# Patient Record
Sex: Female | Born: 1950 | Race: White | Hispanic: No | Marital: Married | Smoking: Never smoker
Health system: Southern US, Community
[De-identification: ages and names within clinical notes are randomized; demographics above are authoritative.]

## PROBLEM LIST (undated history)

## (undated) DIAGNOSIS — M549 Dorsalgia, unspecified: Secondary | ICD-10-CM

## (undated) DIAGNOSIS — R3915 Urgency of urination: Secondary | ICD-10-CM

## (undated) DIAGNOSIS — D649 Anemia, unspecified: Secondary | ICD-10-CM

## (undated) DIAGNOSIS — F32A Depression, unspecified: Secondary | ICD-10-CM

## (undated) DIAGNOSIS — M1990 Unspecified osteoarthritis, unspecified site: Secondary | ICD-10-CM

## (undated) DIAGNOSIS — R918 Other nonspecific abnormal finding of lung field: Secondary | ICD-10-CM

## (undated) DIAGNOSIS — M47816 Spondylosis without myelopathy or radiculopathy, lumbar region: Secondary | ICD-10-CM

## (undated) DIAGNOSIS — J45909 Unspecified asthma, uncomplicated: Secondary | ICD-10-CM

## (undated) DIAGNOSIS — E559 Vitamin D deficiency, unspecified: Secondary | ICD-10-CM

## (undated) DIAGNOSIS — IMO0002 Reserved for concepts with insufficient information to code with codable children: Secondary | ICD-10-CM

## (undated) DIAGNOSIS — I639 Cerebral infarction, unspecified: Secondary | ICD-10-CM

## (undated) DIAGNOSIS — F411 Generalized anxiety disorder: Secondary | ICD-10-CM

## (undated) DIAGNOSIS — Z8659 Personal history of other mental and behavioral disorders: Secondary | ICD-10-CM

## (undated) DIAGNOSIS — N3010 Interstitial cystitis (chronic) without hematuria: Secondary | ICD-10-CM

## (undated) DIAGNOSIS — F419 Anxiety disorder, unspecified: Secondary | ICD-10-CM

## (undated) DIAGNOSIS — R350 Frequency of micturition: Secondary | ICD-10-CM

## (undated) DIAGNOSIS — N189 Chronic kidney disease, unspecified: Secondary | ICD-10-CM

## (undated) DIAGNOSIS — K834 Spasm of sphincter of Oddi: Secondary | ICD-10-CM

## (undated) DIAGNOSIS — J4540 Moderate persistent asthma, uncomplicated: Secondary | ICD-10-CM

## (undated) DIAGNOSIS — F329 Major depressive disorder, single episode, unspecified: Secondary | ICD-10-CM

## (undated) DIAGNOSIS — R0600 Dyspnea, unspecified: Secondary | ICD-10-CM

## (undated) DIAGNOSIS — R7303 Prediabetes: Secondary | ICD-10-CM

## (undated) DIAGNOSIS — R112 Nausea with vomiting, unspecified: Secondary | ICD-10-CM

## (undated) DIAGNOSIS — Z8719 Personal history of other diseases of the digestive system: Secondary | ICD-10-CM

## (undated) DIAGNOSIS — E782 Mixed hyperlipidemia: Secondary | ICD-10-CM

## (undated) DIAGNOSIS — M5090 Cervical disc disorder, unspecified, unspecified cervical region: Secondary | ICD-10-CM

## (undated) DIAGNOSIS — K602 Anal fissure, unspecified: Secondary | ICD-10-CM

## (undated) DIAGNOSIS — Z973 Presence of spectacles and contact lenses: Secondary | ICD-10-CM

## (undated) DIAGNOSIS — G43909 Migraine, unspecified, not intractable, without status migrainosus: Secondary | ICD-10-CM

## (undated) DIAGNOSIS — E785 Hyperlipidemia, unspecified: Secondary | ICD-10-CM

## (undated) DIAGNOSIS — R05 Cough: Secondary | ICD-10-CM

## (undated) DIAGNOSIS — IMO0001 Reserved for inherently not codable concepts without codable children: Secondary | ICD-10-CM

## (undated) DIAGNOSIS — R002 Palpitations: Secondary | ICD-10-CM

## (undated) DIAGNOSIS — N641 Fat necrosis of breast: Secondary | ICD-10-CM

## (undated) DIAGNOSIS — I10 Essential (primary) hypertension: Secondary | ICD-10-CM

## (undated) DIAGNOSIS — T4145XA Adverse effect of unspecified anesthetic, initial encounter: Secondary | ICD-10-CM

## (undated) DIAGNOSIS — K589 Irritable bowel syndrome without diarrhea: Secondary | ICD-10-CM

## (undated) DIAGNOSIS — Z9889 Other specified postprocedural states: Secondary | ICD-10-CM

## (undated) DIAGNOSIS — Z8673 Personal history of transient ischemic attack (TIA), and cerebral infarction without residual deficits: Secondary | ICD-10-CM

## (undated) DIAGNOSIS — N183 Chronic kidney disease, stage 3 unspecified: Secondary | ICD-10-CM

## (undated) DIAGNOSIS — R3989 Other symptoms and signs involving the genitourinary system: Secondary | ICD-10-CM

## (undated) DIAGNOSIS — K582 Mixed irritable bowel syndrome: Secondary | ICD-10-CM

## (undated) DIAGNOSIS — M797 Fibromyalgia: Secondary | ICD-10-CM

## (undated) DIAGNOSIS — F4001 Agoraphobia with panic disorder: Secondary | ICD-10-CM

## (undated) DIAGNOSIS — K219 Gastro-esophageal reflux disease without esophagitis: Secondary | ICD-10-CM

## (undated) DIAGNOSIS — R053 Chronic cough: Secondary | ICD-10-CM

## (undated) DIAGNOSIS — R011 Cardiac murmur, unspecified: Secondary | ICD-10-CM

## (undated) DIAGNOSIS — J302 Other seasonal allergic rhinitis: Secondary | ICD-10-CM

## (undated) DIAGNOSIS — R0989 Other specified symptoms and signs involving the circulatory and respiratory systems: Secondary | ICD-10-CM

## (undated) HISTORY — DX: Unspecified osteoarthritis, unspecified site: M19.90

## (undated) HISTORY — DX: Anal fissure, unspecified: K60.2

## (undated) HISTORY — PX: OTHER SURGICAL HISTORY: SHX169

## (undated) HISTORY — DX: Depression, unspecified: F32.A

## (undated) HISTORY — DX: Unspecified asthma, uncomplicated: J45.909

## (undated) HISTORY — DX: Prediabetes: R73.03

## (undated) HISTORY — DX: Interstitial cystitis (chronic) without hematuria: N30.10

## (undated) HISTORY — DX: Palpitations: R00.2

## (undated) HISTORY — PX: COLONOSCOPY: SHX174

## (undated) HISTORY — PX: CATARACT EXTRACTION W/ INTRAOCULAR LENS  IMPLANT, BILATERAL: SHX1307

## (undated) HISTORY — DX: Gastro-esophageal reflux disease without esophagitis: K21.9

## (undated) HISTORY — DX: Cardiac murmur, unspecified: R01.1

## (undated) HISTORY — DX: Irritable bowel syndrome without diarrhea: K58.9

## (undated) HISTORY — DX: Essential (primary) hypertension: I10

## (undated) HISTORY — DX: Major depressive disorder, single episode, unspecified: F32.9

## (undated) HISTORY — DX: Dorsalgia, unspecified: M54.9

## (undated) HISTORY — DX: Vitamin D deficiency, unspecified: E55.9

## (undated) HISTORY — DX: Cerebral infarction, unspecified: I63.9

## (undated) HISTORY — DX: Anemia, unspecified: D64.9

## (undated) HISTORY — DX: Hyperlipidemia, unspecified: E78.5

## (undated) HISTORY — DX: Spasm of sphincter of Oddi: K83.4

## (undated) HISTORY — DX: Fibromyalgia: M79.7

## (undated) HISTORY — DX: Anxiety disorder, unspecified: F41.9

## (undated) HISTORY — PX: HEMORRHOID SURGERY: SHX153

---
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
## 1975-05-01 HISTORY — PX: NASAL SINUS SURGERY: SHX719

## 1975-05-01 HISTORY — PX: RHINOPLASTY: SUR1284

## 1976-04-30 HISTORY — PX: OTHER SURGICAL HISTORY: SHX169

## 1981-04-30 HISTORY — PX: CYSTOSTOMY W/ BLADDER BIOPSY: SHX1431

## 1984-07-29 HISTORY — PX: CHOLECYSTECTOMY: SHX55

## 1984-07-29 HISTORY — PX: APPENDECTOMY: SHX54

## 1985-04-30 DIAGNOSIS — K834 Spasm of sphincter of Oddi: Secondary | ICD-10-CM

## 1985-04-30 HISTORY — PX: DILATION AND CURETTAGE OF UTERUS: SHX78

## 1985-04-30 HISTORY — DX: Spasm of sphincter of Oddi: K83.4

## 1986-04-30 HISTORY — PX: ERCP: SHX60

## 1986-04-30 HISTORY — PX: REDUCTION MAMMAPLASTY: SUR839

## 1986-04-30 HISTORY — PX: OTHER SURGICAL HISTORY: SHX169

## 1986-04-30 HISTORY — PX: ABDOMINAL HYSTERECTOMY: SHX81

## 1988-04-30 HISTORY — PX: OTHER SURGICAL HISTORY: SHX169

## 1994-04-30 DIAGNOSIS — Z8673 Personal history of transient ischemic attack (TIA), and cerebral infarction without residual deficits: Secondary | ICD-10-CM

## 1994-04-30 HISTORY — DX: Personal history of transient ischemic attack (TIA), and cerebral infarction without residual deficits: Z86.73

## 1996-04-30 HISTORY — PX: OTHER SURGICAL HISTORY: SHX169

## 1997-09-17 ENCOUNTER — Other Ambulatory Visit: Admission: RE | Admit: 1997-09-17 | Discharge: 1997-09-17 | Payer: Self-pay | Admitting: Obstetrics & Gynecology

## 1998-08-12 ENCOUNTER — Ambulatory Visit (HOSPITAL_BASED_OUTPATIENT_CLINIC_OR_DEPARTMENT_OTHER): Admission: RE | Admit: 1998-08-12 | Discharge: 1998-08-12 | Payer: Self-pay | Admitting: Urology

## 1999-03-28 ENCOUNTER — Emergency Department (HOSPITAL_COMMUNITY): Admission: EM | Admit: 1999-03-28 | Discharge: 1999-03-28 | Payer: Self-pay | Admitting: *Deleted

## 1999-03-29 ENCOUNTER — Encounter: Payer: Self-pay | Admitting: Urology

## 1999-03-29 ENCOUNTER — Ambulatory Visit (HOSPITAL_COMMUNITY): Admission: RE | Admit: 1999-03-29 | Discharge: 1999-03-29 | Payer: Self-pay | Admitting: Urology

## 1999-06-13 ENCOUNTER — Encounter: Admission: RE | Admit: 1999-06-13 | Discharge: 1999-06-13 | Payer: Self-pay | Admitting: Obstetrics & Gynecology

## 1999-06-13 ENCOUNTER — Encounter: Payer: Self-pay | Admitting: Obstetrics & Gynecology

## 1999-07-14 ENCOUNTER — Ambulatory Visit (HOSPITAL_BASED_OUTPATIENT_CLINIC_OR_DEPARTMENT_OTHER): Admission: RE | Admit: 1999-07-14 | Discharge: 1999-07-14 | Payer: Self-pay | Admitting: Urology

## 1999-10-24 ENCOUNTER — Other Ambulatory Visit: Admission: RE | Admit: 1999-10-24 | Discharge: 1999-10-24 | Payer: Self-pay | Admitting: Obstetrics & Gynecology

## 2000-05-09 ENCOUNTER — Ambulatory Visit (HOSPITAL_BASED_OUTPATIENT_CLINIC_OR_DEPARTMENT_OTHER): Admission: RE | Admit: 2000-05-09 | Discharge: 2000-05-09 | Payer: Self-pay | Admitting: Urology

## 2000-08-08 ENCOUNTER — Ambulatory Visit (HOSPITAL_COMMUNITY): Admission: RE | Admit: 2000-08-08 | Discharge: 2000-08-08 | Payer: Self-pay

## 2000-11-25 ENCOUNTER — Other Ambulatory Visit: Admission: RE | Admit: 2000-11-25 | Discharge: 2000-11-25 | Payer: Self-pay | Admitting: Obstetrics & Gynecology

## 2000-12-31 ENCOUNTER — Encounter: Admission: RE | Admit: 2000-12-31 | Discharge: 2000-12-31 | Payer: Self-pay | Admitting: Obstetrics & Gynecology

## 2000-12-31 ENCOUNTER — Encounter: Payer: Self-pay | Admitting: Obstetrics & Gynecology

## 2001-03-12 ENCOUNTER — Ambulatory Visit (HOSPITAL_BASED_OUTPATIENT_CLINIC_OR_DEPARTMENT_OTHER): Admission: RE | Admit: 2001-03-12 | Discharge: 2001-03-12 | Payer: Self-pay | Admitting: *Deleted

## 2001-10-23 ENCOUNTER — Ambulatory Visit (HOSPITAL_BASED_OUTPATIENT_CLINIC_OR_DEPARTMENT_OTHER): Admission: RE | Admit: 2001-10-23 | Discharge: 2001-10-23 | Payer: Self-pay | Admitting: Urology

## 2001-12-15 ENCOUNTER — Other Ambulatory Visit: Admission: RE | Admit: 2001-12-15 | Discharge: 2001-12-15 | Payer: Self-pay | Admitting: Obstetrics & Gynecology

## 2002-01-20 ENCOUNTER — Encounter: Payer: Self-pay | Admitting: Obstetrics & Gynecology

## 2002-01-20 ENCOUNTER — Encounter: Admission: RE | Admit: 2002-01-20 | Discharge: 2002-01-20 | Payer: Self-pay | Admitting: Obstetrics & Gynecology

## 2002-12-10 ENCOUNTER — Other Ambulatory Visit: Admission: RE | Admit: 2002-12-10 | Discharge: 2002-12-10 | Payer: Self-pay | Admitting: Obstetrics & Gynecology

## 2003-01-15 ENCOUNTER — Ambulatory Visit (HOSPITAL_BASED_OUTPATIENT_CLINIC_OR_DEPARTMENT_OTHER): Admission: RE | Admit: 2003-01-15 | Discharge: 2003-01-15 | Payer: Self-pay | Admitting: Urology

## 2003-01-15 ENCOUNTER — Ambulatory Visit (HOSPITAL_COMMUNITY): Admission: RE | Admit: 2003-01-15 | Discharge: 2003-01-15 | Payer: Self-pay | Admitting: Urology

## 2003-02-03 ENCOUNTER — Encounter: Admission: RE | Admit: 2003-02-03 | Discharge: 2003-02-03 | Payer: Self-pay | Admitting: Obstetrics & Gynecology

## 2003-02-03 ENCOUNTER — Encounter: Payer: Self-pay | Admitting: Obstetrics & Gynecology

## 2003-07-30 HISTORY — PX: LASIK: SHX215

## 2003-12-17 ENCOUNTER — Ambulatory Visit (HOSPITAL_BASED_OUTPATIENT_CLINIC_OR_DEPARTMENT_OTHER): Admission: RE | Admit: 2003-12-17 | Discharge: 2003-12-17 | Payer: Self-pay | Admitting: Urology

## 2004-02-08 ENCOUNTER — Encounter: Admission: RE | Admit: 2004-02-08 | Discharge: 2004-02-08 | Payer: Self-pay | Admitting: Obstetrics & Gynecology

## 2004-02-16 ENCOUNTER — Other Ambulatory Visit: Admission: RE | Admit: 2004-02-16 | Discharge: 2004-02-16 | Payer: Self-pay | Admitting: Obstetrics & Gynecology

## 2004-03-03 ENCOUNTER — Ambulatory Visit (HOSPITAL_COMMUNITY): Admission: RE | Admit: 2004-03-03 | Discharge: 2004-03-03 | Payer: Self-pay | Admitting: Gastroenterology

## 2004-05-29 ENCOUNTER — Encounter: Admission: RE | Admit: 2004-05-29 | Discharge: 2004-05-29 | Payer: Self-pay | Admitting: Internal Medicine

## 2004-08-18 ENCOUNTER — Ambulatory Visit (HOSPITAL_COMMUNITY): Admission: RE | Admit: 2004-08-18 | Discharge: 2004-08-18 | Payer: Self-pay | Admitting: Urology

## 2004-08-18 ENCOUNTER — Ambulatory Visit (HOSPITAL_BASED_OUTPATIENT_CLINIC_OR_DEPARTMENT_OTHER): Admission: RE | Admit: 2004-08-18 | Discharge: 2004-08-18 | Payer: Self-pay | Admitting: Urology

## 2004-11-20 ENCOUNTER — Encounter: Admission: RE | Admit: 2004-11-20 | Discharge: 2004-11-20 | Payer: Self-pay | Admitting: Internal Medicine

## 2004-11-28 ENCOUNTER — Ambulatory Visit: Payer: Self-pay

## 2005-02-26 ENCOUNTER — Encounter: Admission: RE | Admit: 2005-02-26 | Discharge: 2005-02-26 | Payer: Self-pay | Admitting: Internal Medicine

## 2005-03-07 ENCOUNTER — Other Ambulatory Visit: Admission: RE | Admit: 2005-03-07 | Discharge: 2005-03-07 | Payer: Self-pay | Admitting: Obstetrics & Gynecology

## 2005-03-12 ENCOUNTER — Encounter: Admission: RE | Admit: 2005-03-12 | Discharge: 2005-03-12 | Payer: Self-pay | Admitting: Internal Medicine

## 2005-04-06 ENCOUNTER — Ambulatory Visit (HOSPITAL_COMMUNITY): Admission: RE | Admit: 2005-04-06 | Discharge: 2005-04-06 | Payer: Self-pay | Admitting: Urology

## 2005-04-06 ENCOUNTER — Ambulatory Visit (HOSPITAL_BASED_OUTPATIENT_CLINIC_OR_DEPARTMENT_OTHER): Admission: RE | Admit: 2005-04-06 | Discharge: 2005-04-06 | Payer: Self-pay | Admitting: Urology

## 2005-11-04 ENCOUNTER — Encounter: Admission: RE | Admit: 2005-11-04 | Discharge: 2005-11-04 | Payer: Self-pay | Admitting: Urology

## 2006-02-22 ENCOUNTER — Ambulatory Visit (HOSPITAL_BASED_OUTPATIENT_CLINIC_OR_DEPARTMENT_OTHER): Admission: RE | Admit: 2006-02-22 | Discharge: 2006-02-22 | Payer: Self-pay | Admitting: Urology

## 2006-03-28 ENCOUNTER — Encounter: Admission: RE | Admit: 2006-03-28 | Discharge: 2006-03-28 | Payer: Self-pay | Admitting: Obstetrics & Gynecology

## 2006-04-19 ENCOUNTER — Encounter: Admission: RE | Admit: 2006-04-19 | Discharge: 2006-04-19 | Payer: Self-pay | Admitting: Urology

## 2006-09-06 ENCOUNTER — Ambulatory Visit (HOSPITAL_BASED_OUTPATIENT_CLINIC_OR_DEPARTMENT_OTHER): Admission: RE | Admit: 2006-09-06 | Discharge: 2006-09-06 | Payer: Self-pay | Admitting: Urology

## 2006-11-22 ENCOUNTER — Encounter: Admission: RE | Admit: 2006-11-22 | Discharge: 2006-11-22 | Payer: Self-pay | Admitting: Urology

## 2007-04-03 ENCOUNTER — Encounter: Admission: RE | Admit: 2007-04-03 | Discharge: 2007-04-03 | Payer: Self-pay | Admitting: Internal Medicine

## 2007-04-04 ENCOUNTER — Ambulatory Visit (HOSPITAL_BASED_OUTPATIENT_CLINIC_OR_DEPARTMENT_OTHER): Admission: RE | Admit: 2007-04-04 | Discharge: 2007-04-04 | Payer: Self-pay | Admitting: Urology

## 2007-11-20 ENCOUNTER — Ambulatory Visit: Payer: Self-pay

## 2008-04-08 ENCOUNTER — Encounter: Admission: RE | Admit: 2008-04-08 | Discharge: 2008-04-08 | Payer: Self-pay | Admitting: Obstetrics & Gynecology

## 2008-05-21 ENCOUNTER — Ambulatory Visit (HOSPITAL_BASED_OUTPATIENT_CLINIC_OR_DEPARTMENT_OTHER): Admission: RE | Admit: 2008-05-21 | Discharge: 2008-05-21 | Payer: Self-pay | Admitting: Urology

## 2008-07-09 ENCOUNTER — Encounter: Admission: RE | Admit: 2008-07-09 | Discharge: 2008-07-09 | Payer: Self-pay | Admitting: Neurosurgery

## 2008-11-15 ENCOUNTER — Encounter: Admission: RE | Admit: 2008-11-15 | Discharge: 2008-11-15 | Payer: Self-pay | Admitting: Internal Medicine

## 2008-12-22 ENCOUNTER — Encounter: Admission: RE | Admit: 2008-12-22 | Discharge: 2008-12-22 | Payer: Self-pay | Admitting: Neurosurgery

## 2009-03-18 ENCOUNTER — Ambulatory Visit (HOSPITAL_BASED_OUTPATIENT_CLINIC_OR_DEPARTMENT_OTHER): Admission: RE | Admit: 2009-03-18 | Discharge: 2009-03-18 | Payer: Self-pay | Admitting: Urology

## 2009-04-13 ENCOUNTER — Encounter: Admission: RE | Admit: 2009-04-13 | Discharge: 2009-04-13 | Payer: Self-pay | Admitting: Obstetrics & Gynecology

## 2009-06-17 ENCOUNTER — Encounter: Admission: RE | Admit: 2009-06-17 | Discharge: 2009-06-17 | Payer: Self-pay | Admitting: Neurosurgery

## 2010-03-31 ENCOUNTER — Ambulatory Visit
Admission: RE | Admit: 2010-03-31 | Discharge: 2010-03-31 | Payer: Self-pay | Source: Home / Self Care | Admitting: Urology

## 2010-04-17 ENCOUNTER — Encounter
Admission: RE | Admit: 2010-04-17 | Discharge: 2010-04-17 | Payer: Self-pay | Source: Home / Self Care | Attending: Obstetrics & Gynecology | Admitting: Obstetrics & Gynecology

## 2010-05-20 ENCOUNTER — Encounter: Payer: Self-pay | Admitting: Internal Medicine

## 2010-05-21 ENCOUNTER — Encounter: Payer: Self-pay | Admitting: Family Medicine

## 2010-05-22 ENCOUNTER — Encounter: Payer: Self-pay | Admitting: Internal Medicine

## 2010-07-10 LAB — POCT I-STAT 4, (NA,K, GLUC, HGB,HCT)
Glucose, Bld: 90 mg/dL (ref 70–99)
HCT: 49 % — ABNORMAL HIGH (ref 36.0–46.0)
Hemoglobin: 16.7 g/dL — ABNORMAL HIGH (ref 12.0–15.0)
Potassium: 4.5 mEq/L (ref 3.5–5.1)
Sodium: 141 mEq/L (ref 135–145)

## 2010-08-02 LAB — POCT I-STAT 4, (NA,K, GLUC, HGB,HCT)
Glucose, Bld: 83 mg/dL (ref 70–99)
Glucose, Bld: 84 mg/dL (ref 70–99)
HCT: 22 % — ABNORMAL LOW (ref 36.0–46.0)
HCT: 27 % — ABNORMAL LOW (ref 36.0–46.0)
Hemoglobin: 7.5 g/dL — ABNORMAL LOW (ref 12.0–15.0)
Hemoglobin: 9.2 g/dL — ABNORMAL LOW (ref 12.0–15.0)
Potassium: 4.1 mEq/L (ref 3.5–5.1)
Potassium: 4.2 mEq/L (ref 3.5–5.1)
Sodium: 139 mEq/L (ref 135–145)
Sodium: 140 mEq/L (ref 135–145)

## 2010-08-14 LAB — POCT I-STAT 4, (NA,K, GLUC, HGB,HCT)
Glucose, Bld: 84 mg/dL (ref 70–99)
HCT: 48 % — ABNORMAL HIGH (ref 36.0–46.0)
Hemoglobin: 16.3 g/dL — ABNORMAL HIGH (ref 12.0–15.0)
Potassium: 4 mEq/L (ref 3.5–5.1)
Sodium: 140 mEq/L (ref 135–145)

## 2010-09-12 NOTE — Op Note (Signed)
NAMEMERRIDITH, Brenda [name]               ACCOUNT NO.:  [id number]   MEDICAL RECORD NO.:  [id number]          PATIENT TYPE:  AMB   LOCATION:  [facility]                         FACILITY:  [facility]   PHYSICIAN:  Maretta Bees. Vonita Moss, M.D.DATE OF BIRTH:  15-Mar-1951   DATE OF PROCEDURE:  04/04/2007  DATE OF DISCHARGE:                               OPERATIVE REPORT   PREOPERATIVE DIAGNOSES:  1. Interstitial cystitis.  2. Urethral stenosis.   POSTOPERATIVE DIAGNOSES:  1. Interstitial cystitis.  2. Urethral stenosis.   PROCEDURES:  1. Urethral dilation.  2. Cystoscopy.  3. Hydraulic overdistention of bladder.  4. Instillation of Clorpactin.   SURGEON:  Maretta Bees. Vonita Moss, M.D.   ANESTHESIA:  MAC.   INDICATIONS:  Brenda [name] has had a long history of interstitial cystitis and  had a flare-up of her symptoms and is brought to the OR today for  further treatment.   PROCEDURE:  She was brought to the operating room and placed in  lithotomy position and the external genitalia prepped and draped in the  usual fashion.  She was dilated from 24 to 60 [place].  She was  cystoscoped and the bladder was unremarkable.  She held approximately  600 mL and I distended her twice, and then she had submucosal petechiae  and hemorrhage in all 4 quadrants but not much bleeding.  Ten minutes of  in-and-out irrigation with 0.4% Clorpactin was completed without  difficulty.  The bladder was emptied, washed out with water, and then 6  mL of tetracaine instilled and left in the bladder.  She tolerated the  procedure well.      Maretta Bees. Vonita Moss, M.D.  Electronically Signed     LJP/MEDQ  D:  04/04/2007  T:  04/04/2007  Job:  [id number]

## 2010-09-12 NOTE — Op Note (Signed)
[name], Brenda [name]               ACCOUNT NO.:  [id number]   MEDICAL RECORD NO.:  [id number]          PATIENT TYPE:  AMB   LOCATION:  [facility]                         FACILITY:  [facility]   PHYSICIAN:  Maretta Bees. Vonita Moss, M.D.DATE OF BIRTH:  1951/03/03   DATE OF PROCEDURE:  05/21/2008  DATE OF DISCHARGE:                               OPERATIVE REPORT   PREOPERATIVE DIAGNOSIS:  Interstitial cystitis and urethral stenosis.   POSTOPERATIVE DIAGNOSES:  Interstitial cystitis and urethral stenosis.   PROCEDURE:  Cystoscopy, urethral dilation, hydraulic overdistention of  bladder and instillation of Clorpactin.   SURGEON:  Dr. Larey Dresser   ANESTHESIA:  MAC.   INDICATIONS:  Brenda [name] needs further therapy of her chronic interstitial  cystitis and is brought to the OR today for that reason.   PROCEDURE:  The patient was brought to the operating room, placed in  lithotomy position after induction of MAC.  Urethra was dilated from 24  to 30 [place].  She was cystoscoped and the bladder had some increased  vascularity and held 500 to 600 mL after which she had some increased  petechiae and mild hemorrhage.  She was then treated with 10 minutes of  in-and-out irrigation of 0.4% Clorpactin.  The bladder was then washed  out with sterile water and 60 mL of tetracaine solution placed in the  bladder and left in place with a Silastic Foley catheter, clamped off.  She will get further tetracaine in the recovery room before she goes  home and will be given 75 mg Demerol and 25 mg Phenergan IM at the time  of discharge from this facility      [employer]. Vonita Moss, M.D.  Electronically Signed     LJP/MEDQ  D:  05/21/2008  T:  05/21/2008  Job:  [id number]

## 2010-09-15 NOTE — Op Note (Signed)
[place]  Patient:    Brenda [name], Brenda [name]                        MRN: [id number] Proc. Date: 07/14/99 Attending:  Maretta Bees. Vonita Moss, M.D.                           Operative Report  PREOPERATIVE DIAGNOSIS:  Interstitial cystitis and urethral stricture.  POSTOPERATIVE DIAGNOSIS:  Interstitial cystitis and urethral stricture.  OPERATION:  Cystoscopy, urethral dilation, hydraulic dilation of bladder and instillation of Clorpactin.  SURGEON:  Maretta Bees. Vonita Moss, M.D.  ANESTHESIA:  MAC.  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  She has had a flare-up of her symptoms and requires intervention at this point.  DESCRIPTION OF PROCEDURE:  The patient was brought to the operating room and placed in the lithotomy position.  External genitalia was prepped and draped in the usual fashion.  She was dilated to 17 [place] after instilling Xylocaine jelly.  She was cystoscoped and the bladder had no stones, tumors, or inflammatory lesions. She had a capacity of 400 cc under anesthesia and looking back in she had scattered  submucosa petechiae and hemorrhage.  She was then treated with 10 minutes of in-and-out irrigation with 0.4% Clorpactin solution.  The bladder was then irrigated with irrigating solution and 60 cc of Tetracaine solution left in the  bladder.  She tolerated the procedure well. DD:  07/14/99 TD:  07/14/99 Job: [id number]

## 2010-09-15 NOTE — Op Note (Signed)
[facility]  Patient:    Brenda [name], Brenda [name] Visit Number: [id number] MRN: [id number]          Service Type: NES Location: [facility] Attending Physician:  Lauree Chandler Dictated by:   Maretta Bees. Vonita Moss, M.D. Proc. Date: 10/23/01 Admit Date:  10/23/2001                             Operative Report  PREOPERATIVE DIAGNOSES:  Interstitial cystitis and urethral syndrome.  POSTOPERATIVE DIAGNOSES:  Interstitial cystitis and urethral syndrome.  PROCEDURE:  Cystoscopy, urethral dilation, hydraulic dilation of the bladder, and installation of Clorpactin.  SURGEON:  Maretta Bees. Vonita Moss, M.D.  ANESTHESIA:  MAC.  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 She is brought to the OR for further evaluation and treatment.  DESCRIPTION OF PROCEDURE:  The patient was brought to the operating room, placed in lithotomy position, external genitalia were prepped and draped in the usual fashion. The urethra was dilated to 30 [place]. She was then cystoscoped and the bladder was unremarkable. She was filled to 500 cc capacity and looking back in she had scattered submucosal petechia. She was then treated with 10 minutes of in and out irrigation of 0.4% Clorpactin solution. The bladder was then irrigated with sterile water and 60 cc of tetracaine solution was left in the bladder. She tolerated the procedure. Dictated by:   Maretta Bees. Vonita Moss, M.D. Attending Physician:  Lauree Chandler DD:  10/23/01 TD:  10/24/01 Job: [id number]

## 2010-09-15 NOTE — Op Note (Signed)
NAMEVENESHA, Brenda [name]               ACCOUNT NO.:  [id number]   MEDICAL RECORD NO.:  [id number]          PATIENT TYPE:  AMB   LOCATION:  [facility]                         FACILITY:  [facility]   PHYSICIAN:  Maretta Bees. Vonita Moss, M.D.DATE OF BIRTH:  09/26/1950   DATE OF PROCEDURE:  08/18/2004  DATE OF DISCHARGE:                                 OPERATIVE REPORT   PREOPERATIVE DIAGNOSES:  Right lower quadrant pain and interstitial  cystitis.   POSTOPERATIVE DIAGNOSES:  Right lower quadrant pain and interstitial  cystitis.   PROCEDURE:  Examination under anesthesia, cystoscopy, urethral dilation,  hydraulic over distention of the bladder and installation of Clorpactin.   SURGEON:  Maretta Bees. Vonita Moss, M.D.   ANESTHESIA:  MAC.   INDICATIONS FOR PROCEDURE:  Brenda [name] has had chronic interstitial cystitis and  had a flareup of her symptoms and also some right lower quadrant discomfort.  She is brought to the OR for evaluation and therapy.   DESCRIPTION OF PROCEDURE:  The patient is brought to the operating room,  placed in lithotomy position and pelvic exam under anesthesia was performed  and there were absolutely no pelvic masses felt. She was cystoscoped and the  bladder was unremarkable. She was distended about 500 mL. Looking back in,  she had scattered submucosal petechia but no florid hemorrhage. She was also  dilated to 65 [place] with female sounds. Ten minutes of in and out  irrigation with 0.4% Clorpactin was performed without difficulty. The  bladder was then drained and irrigated with water and then 6 mL of  __________ was left in the bladder at the end of the case and she was taken  to the recovery room in good condition having tolerated the procedure well.      LJP/MEDQ  D:  08/18/2004  T:  08/18/2004  Job:  [id number]

## 2010-09-15 NOTE — Op Note (Signed)
NAME:  Brenda [name], Brenda [name]                         ACCOUNT NO.:  [id number]   MEDICAL RECORD NO.:  [id number]                   PATIENT TYPE:  AMB   LOCATION:  [facility]                                 FACILITY:  [facility]   PHYSICIAN:  Maretta Bees. Vonita Moss, M.D.             DATE OF BIRTH:  [birth year]   DATE OF PROCEDURE:  DATE OF DISCHARGE:                                 OPERATIVE REPORT   No dictation for this job.                                               Maretta Bees. Vonita Moss, M.D.    LJP/MEDQ  D:  12/17/2003  T:  12/17/2003  Job:  [id number]

## 2010-09-15 NOTE — Op Note (Signed)
Brenda, [name]               ACCOUNT NO.:  [id number]   MEDICAL RECORD NO.:  [id number]          PATIENT TYPE:  AMB   LOCATION:  [facility]                         FACILITY:  [facility]   PHYSICIAN:  Maretta Bees. Vonita Moss, M.D.DATE OF BIRTH:  Jun 26, 1950   DATE OF PROCEDURE:  04/06/2005  DATE OF DISCHARGE:                                 OPERATIVE REPORT   PREOPERATIVE DIAGNOSIS:  Chronic interstitial cystitis.   POSTOPERATIVE DIAGNOSIS:  Chronic interstitial cystitis.   PROCEDURE:  Cystoscopy, hydraulic overdistention of bladder, installation of  Clorpactin, and urethral dilation.   SURGEON:  Dr. Larey Dresser   ANESTHESIA:  MAC.   INDICATIONS:  Brenda [name] has had a flare-up of her interstitial cystitis and  brought the OR today for treatment of such.   PROCEDURE:  The patient was brought to the operating room, placed in  lithotomy position, and monitored and MAC was utilized for sedation and  comfort. Urethra was dilated to 28-French. Cystoscopy was performed, and the  bladder was unremarkable. She was distended to a little over 500 mL. In  looking back in, there were some widespread submucosal petechiae and slight  hemorrhage. She then underwent 10 minutes of in-and-out irrigation of 0.4%  Clorpactin solution. Then the bladder was irrigated with sterile water and 6  mL of tetracaine was left in the bladder, and she was taken to the recovery  room in good condition having tolerated the procedure well.      Maretta Bees. Vonita Moss, M.D.  Electronically Signed     LJP/MEDQ  D:  04/06/2005  T:  04/06/2005  Job:  [id number]

## 2010-09-15 NOTE — Op Note (Signed)
Brenda [name], Brenda [name]               ACCOUNT NO.:  [id number]   MEDICAL RECORD NO.:  [id number]          PATIENT TYPE:  AMB   LOCATION:  [facility]                         FACILITY:  [facility]   PHYSICIAN:  Maretta Bees. Vonita Moss, M.D.DATE OF BIRTH:  06-12-1950   DATE OF PROCEDURE:  02/22/2006  DATE OF DISCHARGE:                                 OPERATIVE REPORT   PREOPERATIVE DIAGNOSES:  1. Interstitial cystitis.  2. Urethral stenosis.   POSTOPERATIVE DIAGNOSES:  1. Interstitial cystitis.  2. Urethral stenosis.   PROCEDURES:  Cystoscopy, urethral dilation, hydraulic overdistention of  bladder and instillation of Clorpactin.   SURGEON:  Maretta Bees. Vonita Moss, M.D.   ANESTHESIA:  MAC.   INDICATIONS:  Brenda [name] has had recurrent symptoms and pain from her IC and is  brought to the OR today for further therapy.   PROCEDURE:  The patient was brought to the operating room and placed in  lithotomy position, external genitalia were prepped and draped in the usual  fashion.  She was dilated with female sounds from 52 to 79 [place].  Cystoscopy was then performed.  The bladder was unremarkable, but she only  held about 500 mL of irrigation fluid.  In looking back in, she had  scattered submucosal petechiae and hemorrhage.  She was then treated with 10  minutes of in-and-out irrigation of 0.4% solution of Clorpactin.  The  bladder was then irrigated with sterile water and 60 mL of tetracaine was  placed in the bladder for postop analgesia.  She was taken to the recovery  room in good condition, having tolerated the procedure well.      Maretta Bees. Vonita Moss, M.D.  Electronically Signed     LJP/MEDQ  D:  02/22/2006  T:  02/23/2006  Job:  [id number]

## 2010-09-15 NOTE — Op Note (Signed)
NAME:  Brenda [name], Brenda [name]                         ACCOUNT NO.:  [id number]   MEDICAL RECORD NO.:  [id number]                   PATIENT TYPE:  AMB   LOCATION:  [facility]                                 FACILITY:  [facility]   PHYSICIAN:  Maretta Bees. Vonita Moss, M.D.             DATE OF BIRTH:  1951/03/30   DATE OF PROCEDURE:  12/17/2003  DATE OF DISCHARGE:                                 OPERATIVE REPORT   PREOPERATIVE DIAGNOSIS:  Interstitial cystitis and urethral stenosis.   POSTOPERATIVE DIAGNOSIS:  Interstitial cystitis and urethral stenosis.   PROCEDURE:  Cystoscopy, urethral dilation, hydraulic over-distention of the  bladder and instillation of Clorpactin.   SURGEON:  Maretta Bees. Vonita Moss, M.D.   ANESTHESIA:  MAC.   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  Because of increased symptoms, is brought to the  OR today for further treatment and evaluation.   PROCEDURE:  The patient is placed in the lithotomy position.  External  genitalia is prepped and draped in the usual fashion.  She underwent  urethral dilation from 22 to 18 [place].  She was cystoscoped, and the  bladder was unremarkable.  She was distended to 600 cc with 36 inches of  irrigation solution pressure, which was her capacity.  Reobservation of the  bladder mucosa revealed widespread submucosal petechiae and hemorrhage.  Ten  minutes of in-and-out irrigation with 0.4% Clorpactin was then completed.  The bladder was then irrigated with sterile water and 60 cc of Tetracaine  instilled in the bladder.  She is taken to the recovery room in good  condition, having tolerated the procedure well.                                               Maretta Bees. Vonita Moss, M.D.    LJP/MEDQ  D:  12/17/2003  T:  12/17/2003  Job:  [id number]

## 2010-09-15 NOTE — Op Note (Signed)
[place]  Patient:    Brenda [name], Brenda [name]                     MRN: [id number] Proc. Date: 05/09/00 Attending:  Maretta Bees. Vonita Moss, M.D.                           Operative Report  PREOPERATIVE DIAGNOSIS: Interstitial cystitis and urethral syndrome.  POSTOPERATIVE DIAGNOSIS: Interstitial cystitis and urethral syndrome.  OPERATION/PROCEDURE:  1. Cystoscopy.  2. Urethral dilation of bladder.  3. Hydraulic dilation of bladder.  4. Instillation of Clorpactin solution.  SURGEON: Maretta Bees. Vonita Moss, M.D.  ANESTHESIA: MAC.  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  She is brought to the operating room today for further therapy.  DESCRIPTION OF PROCEDURE: The patient was brought to the operating room and placed in the lithotomy position on the operating room table.  External genitalia were prepped and draped in the usual sterile fashion.  She was dilated to a 39 [place] and was then cystoscoped.  The bladder had no stones, tumors, or inflammatory lesions.  The bladder was dilated to 525 cc and she was then treated with ten minutes of in-and-out irrigation of 0.4% Clorpactin solution.  The bladder was then irrigated with sterile irrigating solution and then 60 cc of tetracaine solution was left in the bladder for postoperative analgesia.  She was taken to the recovery room in good condition. DD:  05/09/00 TD:  05/09/00 Job: [id number]

## 2010-09-15 NOTE — Op Note (Signed)
NAMEELLICIA, ALIX               ACCOUNT NO.:  [id number]   MEDICAL RECORD NO.:  [id number]          PATIENT TYPE:  AMB   LOCATION:  [facility]                         FACILITY:  [facility]   PHYSICIAN:  Maretta Bees. Vonita Moss, M.D.DATE OF BIRTH:  01-13-1951   DATE OF PROCEDURE:  09/06/2006  DATE OF DISCHARGE:                               OPERATIVE REPORT   PREOPERATIVE DIAGNOSIS:  Interstitial cystitis and urethral stenosis.   POSTOPERATIVE DIAGNOSIS:  Interstitial cystitis and urethral stenosis.   PROCEDURE:  Cystoscopy, urethral dilation, hydraulic over-distention of  bladder and installation of Clorpactin.   SURGEON:  Maretta Bees. Vonita Moss, M.D.   ANESTHESIA:  MAC.   INDICATIONS FOR PROCEDURE:  Jazlynn has had a flare-up of her IC and is  brought to the OR for appropriate therapy.   DESCRIPTION OF PROCEDURE:  She is prepped and draped in the usual  fashion after placing in the lithotomy position.  Urethra was dilated  from 24-30 [place].  Cystoscopy revealed a normal bladder mucosa and then  a distended bladder with close to 600 mL of irrigating fluid and looked  back in and she had scattered submucosal petechiae and consistent with  IC.  She then underwent 10 minutes of in and out irrigation of 0.4%  Clorpactin solution.  The bladder was then irrigated out and 60 mL of  tetracaine instilled into the bladder and she was taken to the recovery  room in good condition having tolerated the procedure well.      Maretta Bees. Vonita Moss, M.D.  Electronically Signed     LJP/MEDQ  D:  09/06/2006  T:  09/06/2006  Job:  [id number]

## 2010-09-15 NOTE — Op Note (Signed)
[facility]  Patient:    [name], Brenda [name]. Visit Number: [id number] MRN: [id number]          Service Type: Attending:  Maretta Bees. Vonita Moss, M.D. Dictated by:   Maretta Bees. Vonita Moss, M.D. Proc. Date: 03/12/01                             Operative Report  PREOPERATIVE DIAGNOSES: 1. Interstitial cystitis. 2. Urethral syndrome.  POSTOPERATIVE DIAGNOSES: 1. Interstitial cystitis. 2. Urethral syndrome.  OPERATION: 1. Cystoscopy. 2. Urethral dilation. 3. Hydraulic dilation of bladder and instillation of Clorpactin.  SURGEON:  Maretta Bees. Vonita Moss, M.D.  ANESTHESIA:  MAC.  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  She comes to the ER today for further therapy.  DESCRIPTION OF PROCEDURE:  The patient was brought to the operating room and placed in lithotomy position.  External genitalia were prepped and draped in the usual fashion.  Tetracaine was placed in the bladder.  She was then dilated with to 30-French per urethra.  She underwent cystoscopy, and the bladder had no stones, tumors, or inflammatory lesions.  She was hydraulically dilated to 600 cc.  On looking back in, she had some mucosal petechiae and hemorrhage in all four quadrants of the bladder.  She was then treated with 10 minutes of in and out irrigation with 0.4% Clorpactin solution.  At this point, the bladder was irrigated with sterile water, and 60 cc of tetracaine was left in the bladder.  She was taken to the recovery room in good condition. Dictated by:   Maretta Bees. Vonita Moss, M.D. Attending:  Maretta Bees. Vonita Moss, M.D. DD:  03/12/01 TD:  03/12/01 Job: [id number]

## 2010-09-15 NOTE — Op Note (Signed)
   NAME:  Brenda [name], Brenda [name]                         ACCOUNT NO.:  [id number]   MEDICAL RECORD NO.:  [id number]                   PATIENT TYPE:  AMB   LOCATION:  [facility]                                 FACILITY:  [facility]   PHYSICIAN:  Maretta Bees. Vonita Moss, M.D.             DATE OF BIRTH:  10-01-1950   DATE OF PROCEDURE:  01/15/2003  DATE OF DISCHARGE:                                 OPERATIVE REPORT   PREOPERATIVE DIAGNOSIS:  Interstitial cystitis.   POSTOPERATIVE DIAGNOSIS:  Interstitial cystitis.   PROCEDURE:  Cystoscopy, urethral dilation, hydraulic dilation of bladder and  instillation of Clorpactin.   SURGEON:  Maretta Bees. Vonita Moss, M.D.   ANESTHESIA:  MAC.   INDICATIONS FOR PROCEDURE:  This 60 year old white female has had a long  history of IC and is brought to the OR today for further therapy.   DESCRIPTION OF PROCEDURE:  The patient was brought to the operating room,  placed in lithotomy position, external genitalia were prepped and draped in  the usual fashion. She was dilated to a 67 [place] with female sounds and the  cystoscoped and the bladder was unremarkable. She was filled with 600 mL at  which time she started leaking around the cystoscope and developed  submucosal petechia and gross hemorrhage after emptying the bladder. She was  then treated with 10 minutes of in and out irrigation with 0.4% Clorpactin  and Tetracaine was placed in the bladder at the end of the procedure. She  was taken to the recovery room in good condition.                                               Maretta Bees. Vonita Moss, M.D.    LJP/MEDQ  D:  01/15/2003  T:  01/15/2003  Job:  [id number]

## 2010-12-07 ENCOUNTER — Encounter: Payer: Self-pay | Admitting: Internal Medicine

## 2010-12-19 ENCOUNTER — Ambulatory Visit (AMBULATORY_SURGERY_CENTER): Payer: PRIVATE HEALTH INSURANCE | Admitting: *Deleted

## 2010-12-19 ENCOUNTER — Encounter: Payer: Self-pay | Admitting: Internal Medicine

## 2010-12-19 VITALS — Ht 60.0 in | Wt 138.6 lb

## 2010-12-19 DIAGNOSIS — Z1211 Encounter for screening for malignant neoplasm of colon: Secondary | ICD-10-CM

## 2010-12-19 MED ORDER — PEG-KCL-NACL-NASULF-NA ASC-C 100 G PO SOLR: ORAL | Status: DC

## 2010-12-19 NOTE — Progress Notes (Signed)
Pt says she has had Versed and Fentanyl for outpatient procedures.  She has not had any problems with sedation when given Versed and Fentanyl.  Patient says last colonoscopy was 2005 with Dr. Sherin Quarry.  She has report at home and will FAX it to our office for review.   Ezra Sites

## 2010-12-21 ENCOUNTER — Encounter: Payer: Self-pay | Admitting: Internal Medicine

## 2010-12-22 ENCOUNTER — Telehealth: Payer: Self-pay | Admitting: Gastroenterology

## 2010-12-22 ENCOUNTER — Telehealth: Payer: Self-pay | Admitting: *Deleted

## 2010-12-22 NOTE — Telephone Encounter (Signed)
Dr. Leone Payor-- pt is scheduled for recall screening colonoscopy on 8/31.  She has complicated medical history although most of her problems have been in the distant past.  She also has numerous drug allergies.  She has had extensive outpatient procedures for interstitial cystitis and has done fine with Versed and Fentanyl.  I did not schedule her for propofol.  Her previous colonoscopy procedures were done with Dr. Sherin Quarry in 1992 and 2005. Family hx of colon cancer in maternal grandmother.  We do not have her 2005 report.  Pt has report at home and will FAX it to [place] for review.  I am going to put her chart on your desk for review.  Would you review her history in EPIC and her office chart to see if she is due for colonoscopy now.  Also, do you want to see her in the office before scheduling colonoscopy or are you okay with direct? Thanks. Ezra Sites

## 2010-12-22 NOTE — Telephone Encounter (Signed)
Message copied by Bernita Buffy on Fri Dec 22, 2010  1:02 PM ------      Message from: Stan Head E      Created: Thu Dec 21, 2010  4:38 PM      Regarding: not ready for colonoscopy       Let her know I have reviewed colonoscopy from 2005 and it was normal so next routine colonoscopy not needed until 2015 (November) will need a recall then            So we should cancel the colonoscopy on 8/31 unless she has had bleeding problems or a heme + stool or a family history of colon cancer- I do not think that is the case from review of Dr. Donnetta Hail notes - please clarify            If she has ?'s can schedule a visit or I will try to call her

## 2010-12-22 NOTE — Telephone Encounter (Signed)
Left a message on voicemail for the patient to return my call. 

## 2010-12-22 NOTE — Telephone Encounter (Signed)
I did look at things as the notes had come and have asked Sophia to call her Not due for colonoscopy yet

## 2010-12-25 ENCOUNTER — Other Ambulatory Visit: Payer: Self-pay | Admitting: Internal Medicine

## 2010-12-25 NOTE — Telephone Encounter (Signed)
ok 

## 2010-12-25 NOTE — Telephone Encounter (Signed)
The patient stated that she is ok with moderate sedation such as Versed, Demerol, or Fentanyl.

## 2010-12-25 NOTE — Telephone Encounter (Signed)
The patient stated that she have had some bleeding, a family hx, and a hemorrhoidectomy and request to still have the colonoscopy. Colonoscopy hasn't been cancelled yet.

## 2010-12-25 NOTE — Telephone Encounter (Signed)
If having bleeding it is appropriate I am concerned about response to moderate sedation and wonder if she should do propofol on 9/4 (think that is next day) She does not have to but if she has had difficulty with colonoscopy in past and standard sedation (remembers pain, etc) then would switch to propofol

## 2010-12-29 ENCOUNTER — Encounter: Payer: Self-pay | Admitting: Internal Medicine

## 2010-12-29 ENCOUNTER — Ambulatory Visit (AMBULATORY_SURGERY_CENTER): Payer: PRIVATE HEALTH INSURANCE | Admitting: Internal Medicine

## 2010-12-29 VITALS — BP 140/90 | HR 63 | Temp 96.8°F | Resp 16 | Ht 60.0 in | Wt 138.0 lb

## 2010-12-29 DIAGNOSIS — Z1211 Encounter for screening for malignant neoplasm of colon: Secondary | ICD-10-CM

## 2010-12-29 DIAGNOSIS — Z800 Family history of malignant neoplasm of digestive organs: Secondary | ICD-10-CM

## 2010-12-29 MED ORDER — SODIUM CHLORIDE 0.9 % IV SOLN: 500.0000 mL | INTRAVENOUS | Status: DC

## 2010-12-29 NOTE — Patient Instructions (Addendum)
Today's colonoscopy was normal except for some anal stenosis (dilated by finger) and an anal tag. Based upon current guidelines I recommend a routine repeat colonoscopy in 10 years (2022). Sooner if you are having problems.  Green and blue discharge instructions reviewed with patient and care partner.  You may resume medications as you were taking them prior to your procedure.

## 2011-01-02 ENCOUNTER — Telehealth: Payer: Self-pay | Admitting: *Deleted

## 2011-01-02 NOTE — Telephone Encounter (Signed)
Follow up Call- Patient questions: Spoke with pt's husband, she was at work. Do you have a fever, pain , or abdominal swelling? no Pain Score  0 *  Have you tolerated food without any problems? yes  Have you been able to return to your normal activities? yes  Do you have any questions about your discharge instructions: Diet   no Medications  no Follow up visit  no  Do you have questions or concerns about your Care? no  Actions: * If pain score is 4 or above: No action needed, pain <4.

## 2011-01-03 ENCOUNTER — Telehealth: Payer: Self-pay | Admitting: Internal Medicine

## 2011-01-04 NOTE — Telephone Encounter (Signed)
Patient's father had polyps in his 54's she knows they were benign, but not sure what type.  Will this affect her recall date?

## 2011-01-10 NOTE — Telephone Encounter (Signed)
no

## 2011-01-10 NOTE — Telephone Encounter (Signed)
I have left a message for the patient on her home phone number with Dr Marvell Fuller response

## 2011-02-05 LAB — POCT I-STAT 4, (NA,K, GLUC, HGB,HCT)
Glucose, Bld: 86
HCT: 46
Hemoglobin: 15.6 — ABNORMAL HIGH
Operator id: 114531
Potassium: 4.7
Sodium: 136

## 2011-02-18 ENCOUNTER — Emergency Department (HOSPITAL_COMMUNITY): Payer: PRIVATE HEALTH INSURANCE

## 2011-02-18 ENCOUNTER — Emergency Department (HOSPITAL_COMMUNITY)
Admission: EM | Admit: 2011-02-18 | Discharge: 2011-02-18 | Disposition: A | Discharge status: Home / Self Care | Payer: PRIVATE HEALTH INSURANCE | Attending: Emergency Medicine | Admitting: Emergency Medicine

## 2011-02-18 DIAGNOSIS — M542 Cervicalgia: Secondary | ICD-10-CM | POA: Insufficient documentation

## 2011-02-18 DIAGNOSIS — Z79899 Other long term (current) drug therapy: Secondary | ICD-10-CM | POA: Insufficient documentation

## 2011-02-18 DIAGNOSIS — I10 Essential (primary) hypertension: Secondary | ICD-10-CM | POA: Insufficient documentation

## 2011-02-18 DIAGNOSIS — Y9229 Other specified public building as the place of occurrence of the external cause: Secondary | ICD-10-CM | POA: Insufficient documentation

## 2011-02-18 DIAGNOSIS — W010XXA Fall on same level from slipping, tripping and stumbling without subsequent striking against object, initial encounter: Secondary | ICD-10-CM | POA: Insufficient documentation

## 2011-02-18 DIAGNOSIS — IMO0002 Reserved for concepts with insufficient information to code with codable children: Secondary | ICD-10-CM | POA: Insufficient documentation

## 2011-02-18 DIAGNOSIS — S60229A Contusion of unspecified hand, initial encounter: Secondary | ICD-10-CM | POA: Insufficient documentation

## 2011-02-18 DIAGNOSIS — K589 Irritable bowel syndrome without diarrhea: Secondary | ICD-10-CM | POA: Insufficient documentation

## 2011-02-18 DIAGNOSIS — K089 Disorder of teeth and supporting structures, unspecified: Secondary | ICD-10-CM | POA: Insufficient documentation

## 2011-02-18 DIAGNOSIS — M25569 Pain in unspecified knee: Secondary | ICD-10-CM | POA: Insufficient documentation

## 2011-02-18 DIAGNOSIS — M79609 Pain in unspecified limb: Secondary | ICD-10-CM | POA: Insufficient documentation

## 2011-02-18 DIAGNOSIS — Z8673 Personal history of transient ischemic attack (TIA), and cerebral infarction without residual deficits: Secondary | ICD-10-CM | POA: Insufficient documentation

## 2011-02-23 ENCOUNTER — Inpatient Hospital Stay (INDEPENDENT_AMBULATORY_CARE_PROVIDER_SITE_OTHER)
Admission: RE | Admit: 2011-02-23 | Discharge: 2011-02-23 | Disposition: A | Discharge status: Home / Self Care | Payer: PRIVATE HEALTH INSURANCE | Source: Ambulatory Visit | Attending: Emergency Medicine | Admitting: Emergency Medicine

## 2011-02-23 ENCOUNTER — Ambulatory Visit (INDEPENDENT_AMBULATORY_CARE_PROVIDER_SITE_OTHER): Payer: PRIVATE HEALTH INSURANCE

## 2011-02-23 DIAGNOSIS — IMO0002 Reserved for concepts with insufficient information to code with codable children: Secondary | ICD-10-CM

## 2011-02-23 DIAGNOSIS — L03119 Cellulitis of unspecified part of limb: Secondary | ICD-10-CM

## 2011-02-23 DIAGNOSIS — L02519 Cutaneous abscess of unspecified hand: Secondary | ICD-10-CM

## 2011-03-15 ENCOUNTER — Other Ambulatory Visit: Payer: Self-pay | Admitting: Internal Medicine

## 2011-03-16 ENCOUNTER — Other Ambulatory Visit: Payer: Self-pay | Admitting: Obstetrics & Gynecology

## 2011-03-16 DIAGNOSIS — R922 Inconclusive mammogram: Secondary | ICD-10-CM

## 2011-03-16 DIAGNOSIS — N644 Mastodynia: Secondary | ICD-10-CM

## 2011-03-30 ENCOUNTER — Ambulatory Visit
Admission: RE | Admit: 2011-03-30 | Discharge: 2011-03-30 | Disposition: A | Discharge status: Home / Self Care | Payer: PRIVATE HEALTH INSURANCE | Source: Ambulatory Visit | Attending: Obstetrics & Gynecology | Admitting: Obstetrics & Gynecology

## 2011-03-30 DIAGNOSIS — N644 Mastodynia: Secondary | ICD-10-CM

## 2011-03-30 DIAGNOSIS — R922 Inconclusive mammogram: Secondary | ICD-10-CM

## 2011-04-27 ENCOUNTER — Other Ambulatory Visit: Payer: Self-pay | Admitting: Obstetrics & Gynecology

## 2011-04-27 DIAGNOSIS — Z1231 Encounter for screening mammogram for malignant neoplasm of breast: Secondary | ICD-10-CM

## 2011-05-08 ENCOUNTER — Ambulatory Visit
Admission: RE | Admit: 2011-05-08 | Discharge: 2011-05-08 | Disposition: A | Discharge status: Home / Self Care | Payer: PRIVATE HEALTH INSURANCE | Source: Ambulatory Visit | Attending: Obstetrics & Gynecology | Admitting: Obstetrics & Gynecology

## 2011-05-08 DIAGNOSIS — Z1231 Encounter for screening mammogram for malignant neoplasm of breast: Secondary | ICD-10-CM

## 2011-05-29 ENCOUNTER — Other Ambulatory Visit: Payer: Self-pay | Admitting: Urology

## 2011-07-09 ENCOUNTER — Encounter (HOSPITAL_BASED_OUTPATIENT_CLINIC_OR_DEPARTMENT_OTHER): Payer: Self-pay | Admitting: *Deleted

## 2011-07-09 NOTE — Progress Notes (Signed)
NPO AFTER MN. ARRIVES AT 0615. NEEDS ISTAT AND EKG. WILL TAKE ATIVAN AND METOPROLOL AM OF SURG. W/ SIP OF WATER.

## 2011-07-11 NOTE — H&P (Signed)
Active Problems Problems  1. Abrasion Of Hands 914.0 2. 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   Past Medical History Problems  1. History of  Anxiety (Symptom) 300.00 2. History of  Asthma 493.90 3. History of  Bladder Irrigation 4. History of  Bladder Irrigation 5. History of  Bladder Irrigation 6. History of  Breast Fibrocystic Disease 610.1 7. History of  Complete Colonoscopy 8. History of  Endometriosis 617.9 9. History of  Endoscopic Retrograde Cholangiopancreatography (ERCP) 10. History of  Fibromyalgia 729.1 11. History of  Hypercholesterolemia 272.0 12. History of  Hypertension 401.9 13. History of  Irritable Bowel Syndrome 564.1 14. History of  Migraine Headache 346.90 15. History of  Pancreatitis 577.9 16. History of  Serum Enzyme Levels - ALT (SGPT) Elevated 790.4 17. History of  Serum Enzyme Levels - AST (SGOT) Elevated 790.4 18. History of  Spasm Of Sphincter Of Oddi 576.5 19. History of  Stroke Syndrome 436  Surgical History Problems  1. History of  Appendectomy 2. History of  Breast Surgery Mastopexy Bilateral 3. History of  Breast Surgery Reduction Procedure Bilateral 4. History of  Cholecystectomy 5. History of  Corneal LASIK Bilateral 6. History of  Cystoscopy With Biopsy 7. History of  Cystoscopy With Dilation Of Bladder 8. History of  Cystoscopy With Dilation Of Bladder 9. History of  Cystoscopy With Dilation Of Bladder 10. History of  Cystoscopy With Dilation Of Bladder 11. History of  Dilation And Curettage 12. History of  Hysterectomy V45.77 13. History of  Liver Biopsy 14. History of  Nasal Septal Deviation Repair 15. History of  Nose Surgery 16. History of  Oophorectomy Bilateral V45.77 17.  History of  Tonsillectomy With Adenoidectomy  Current Meds 1. Anesthetic Cocktail - Bladder Instillation; anesthetic cocktail  #322  Lidocaine 2%  10 ml, sod  bicarb 8.4%  5 ml  and Heparin 5,000 units/ml  8 ml; Therapy: 20Jan2011 to (Last Rx:26Mar2012) 2. Calcium + D TABS; Therapy: (Recorded:26Aug2010) to 3. Climara 0.1 MG/24HR Transdermal Patch Weekly; Therapy: (Recorded:06Dec2012) to 4. FLUoxetine HCl 10 MG Oral Tablet; TAKE 0.5 TABLET Twice daily; Therapy:  (Recorded:30Jan2012) to 5. Lisinopril 20 MG Oral Tablet; Therapy: (Recorded:26Aug2010) to 6. LORazepam 1 MG Oral Tablet; Therapy: (Recorded:26Aug2010) to 7. Metoprolol Tartrate 50 MG Oral Tablet; Therapy: (Recorded:26Aug2010) to 8. Vitamin D (Ergocalciferol) 50000 UNIT Oral Capsule; Therapy: (Recorded:30Jan2012) to 9. Zetia 10 MG Oral Tablet; Therapy: (Recorded:26Aug2010) to  Allergies Medication  1. Codeine Derivatives 2. Dilaudid TABS 3. Marcaine SOLN 4. Morphine Derivatives 5. Stadol SOLN  Family History Problems  1. Maternal history of  Acute Myocardial Infarction V17.3 2. Paternal history of  Bladder Cancer V16.52 3. Maternal history of  Congestive Heart Failure 4. Maternal history of  Coronary Artery Disease 5. Family history of  Death In The Family Father age 76 of brain tumor 32. Family history of  Death In The Family Mother age 63 of respiratory failure and pneumonia 7. Maternal history of  Diabetes Mellitus V18.0 8. Sororal history of  Diabetes Mellitus V18.0 9. Family history of  Family Health Status Number Of Children No children 10. Maternal history of  Fibromyalgia 11. Maternal history of  Gastric Ulcer 12. Paternal history of  Glioblastoma Multiforme (Grade IV) Of The Brain 13. Maternal history of  Heart Disease  V17.49 14. Paternal history of  Hypercholesterolemia 15. Sororal history of  Malignant Melanoma Of The Skin V16.8 16. Maternal history of  Skin Cancer V16.8 17. Paternal history of  Skin Cancer  V16.8 18. Maternal history of  Stroke Syndrome V17.1  Social History Problems  1. Caffeine Use 2-3 a day 2. Marital History - Currently Married 3. Never A Smoker 4. 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 History of  Alcohol Use 6. History of  Tobacco Use  Review of Systems  Genitourinary: no dysuria and no hematuria.  Cardiovascular: no chest pain.  Respiratory: no shortness of breath.    Physical Exam Constitutional: Well nourished and well developed . No acute distress.  Pulmonary: No respiratory distress and normal respiratory rhythm and effort.  Cardiovascular: Heart rate and rhythm are normal . No peripheral edema.    Assessment Assessed  1. 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   Plan  I will proceed with cystoscopy with urethral dilation, HOD and instillation of clorpactin.   She will need Demerol post op and a silastic catheter left in for discharge. She will get Ancef for the procedure.

## 2011-07-11 NOTE — Anesthesia Preprocedure Evaluation (Addendum)
Anesthesia Evaluation  Patient identified by MRN, date of birth, ID band Patient awake    Reviewed: Allergy & Precautions, H&P , NPO status , Patient's Chart, lab work & pertinent test results, reviewed documented beta blocker date and time   Airway Mallampati: II TM Distance: >3 FB Neck ROM: full    Dental  (+) Caps and Dental Advisory Given,    Pulmonary asthma ,  breath sounds clear to auscultation  Pulmonary exam normal       Cardiovascular Exercise Tolerance: Good hypertension, Pt. on home beta blockers Rhythm:regular Rate:Normal  Normal stress test 2009   Neuro/Psych Anxiety Panic attacks in pastCVA 1996 and 2009.  No known residual. CVA, No Residual Symptoms negative neurological ROS  negative psych ROS   GI/Hepatic negative GI ROS, Neg liver ROS, GERD-  Controlled,  Endo/Other  negative endocrine ROS  Renal/GU negative Renal ROS  negative genitourinary   Musculoskeletal  (+) Fibromyalgia -  Abdominal   Peds  Hematology negative hematology ROS (+)   Anesthesia Other Findings Lactated Ringers contraindicated according to history due to a psychiatrist telling her it might be causing her panic attacks.  Reproductive/Obstetrics negative OB ROS                         Anesthesia Physical Anesthesia Plan  ASA: III  Anesthesia Plan: MAC   Post-op Pain Management:    Induction:   Airway Management Planned: Simple Face Mask  Additional Equipment:   Intra-op Plan:   Post-operative Plan:   Informed Consent: I have reviewed the patients History and Physical, chart, labs and discussed the procedure including the risks, benefits and alternatives for the proposed anesthesia with the patient or authorized representative who has indicated his/her understanding and acceptance.   Dental Advisory Given  Plan Discussed with: CRNA and Surgeon  Anesthesia Plan Comments: (A psychiatrist told  her LR might have caused her panic attacks with previous surgeries years ago.  No problem for the last few years.)      Anesthesia Quick Evaluation

## 2011-07-12 ENCOUNTER — Encounter (HOSPITAL_BASED_OUTPATIENT_CLINIC_OR_DEPARTMENT_OTHER)
Admission: RE | Disposition: A | Discharge status: Home / Self Care | Payer: Self-pay | Source: Ambulatory Visit | Attending: Urology

## 2011-07-12 ENCOUNTER — Encounter (HOSPITAL_BASED_OUTPATIENT_CLINIC_OR_DEPARTMENT_OTHER): Payer: Self-pay | Admitting: Anesthesiology

## 2011-07-12 ENCOUNTER — Other Ambulatory Visit: Payer: Self-pay

## 2011-07-12 ENCOUNTER — Ambulatory Visit (HOSPITAL_BASED_OUTPATIENT_CLINIC_OR_DEPARTMENT_OTHER)
Admission: RE | Admit: 2011-07-12 | Discharge: 2011-07-12 | Disposition: A | Discharge status: Home / Self Care | Payer: PRIVATE HEALTH INSURANCE | Source: Ambulatory Visit | Attending: Urology | Admitting: Urology

## 2011-07-12 ENCOUNTER — Ambulatory Visit (HOSPITAL_BASED_OUTPATIENT_CLINIC_OR_DEPARTMENT_OTHER): Payer: PRIVATE HEALTH INSURANCE | Admitting: Anesthesiology

## 2011-07-12 ENCOUNTER — Encounter (HOSPITAL_BASED_OUTPATIENT_CLINIC_OR_DEPARTMENT_OTHER): Payer: Self-pay | Admitting: *Deleted

## 2011-07-12 DIAGNOSIS — Z79899 Other long term (current) drug therapy: Secondary | ICD-10-CM | POA: Insufficient documentation

## 2011-07-12 DIAGNOSIS — I10 Essential (primary) hypertension: Secondary | ICD-10-CM | POA: Insufficient documentation

## 2011-07-12 DIAGNOSIS — E7800 Pure hypercholesterolemia, unspecified: Secondary | ICD-10-CM | POA: Insufficient documentation

## 2011-07-12 DIAGNOSIS — N3010 Interstitial cystitis (chronic) without hematuria: Secondary | ICD-10-CM | POA: Insufficient documentation

## 2011-07-12 HISTORY — DX: Frequency of micturition: R35.0

## 2011-07-12 HISTORY — DX: Personal history of transient ischemic attack (TIA), and cerebral infarction without residual deficits: Z86.73

## 2011-07-12 HISTORY — DX: Personal history of other diseases of the digestive system: Z87.19

## 2011-07-12 HISTORY — DX: Adverse effect of unspecified anesthetic, initial encounter: T41.45XA

## 2011-07-12 HISTORY — DX: Other symptoms and signs involving the genitourinary system: R39.89

## 2011-07-12 HISTORY — DX: Reserved for concepts with insufficient information to code with codable children: IMO0002

## 2011-07-12 HISTORY — DX: Urgency of urination: R39.15

## 2011-07-12 HISTORY — PX: CYSTOSCOPY WITH URETHRAL DILATATION: SHX5125

## 2011-07-12 HISTORY — DX: Reserved for inherently not codable concepts without codable children: IMO0001

## 2011-07-12 HISTORY — DX: Migraine, unspecified, not intractable, without status migrainosus: G43.909

## 2011-07-12 LAB — POCT I-STAT 4, (NA,K, GLUC, HGB,HCT)
Glucose, Bld: 92 mg/dL (ref 70–99)
HCT: 44 % (ref 36.0–46.0)
Hemoglobin: 15 g/dL (ref 12.0–15.0)
Potassium: 4.3 mEq/L (ref 3.5–5.1)
Sodium: 142 mEq/L (ref 135–145)

## 2011-07-12 SURGERY — CYSTOSCOPY, WITH URETHRAL DILATION
Anesthesia: Monitor Anesthesia Care

## 2011-07-12 MED ORDER — STERILE WATER FOR INJECTION IJ SOLN
Status: DC | PRN
  Administered 2011-07-12: 08:00:00 via INTRAVESICAL

## 2011-07-12 MED ORDER — ACETAMINOPHEN 650 MG RE SUPP: 650.0000 mg | RECTAL | Status: DC | PRN

## 2011-07-12 MED ORDER — MEPERIDINE HCL 50 MG/ML IJ SOLN
50.0000 mg | Freq: Once | INTRAMUSCULAR | Status: AC
  Administered 2011-07-12: 50 mg via INTRAMUSCULAR

## 2011-07-12 MED ORDER — SODIUM CHLORIDE 0.9 % IV SOLN: 250.0000 mL | INTRAVENOUS | Status: DC | PRN

## 2011-07-12 MED ORDER — SODIUM CHLORIDE 0.9 % IJ SOLN: 3.0000 mL | Freq: Two times a day (BID) | INTRAMUSCULAR | Status: DC

## 2011-07-12 MED ORDER — FENTANYL CITRATE 0.05 MG/ML IJ SOLN
INTRAMUSCULAR | Status: DC | PRN
  Administered 2011-07-12: 25 ug via INTRAVENOUS
  Administered 2011-07-12 (×2): 50 ug via INTRAVENOUS
  Administered 2011-07-12: 25 ug via INTRAVENOUS

## 2011-07-12 MED ORDER — SODIUM CHLORIDE 0.9 % IJ SOLN: 3.0000 mL | INTRAMUSCULAR | Status: DC | PRN

## 2011-07-12 MED ORDER — MEPERIDINE HCL 50 MG/ML IJ SOLN: 50.0000 mg | Freq: Once | INTRAMUSCULAR | Status: AC

## 2011-07-12 MED ORDER — ACETAMINOPHEN 325 MG PO TABS: 650.0000 mg | ORAL_TABLET | ORAL | Status: DC | PRN

## 2011-07-12 MED ORDER — MIDAZOLAM HCL 5 MG/5ML IJ SOLN
INTRAMUSCULAR | Status: DC | PRN
  Administered 2011-07-12 (×2): 1 mg via INTRAVENOUS

## 2011-07-12 MED ORDER — ONDANSETRON HCL 4 MG/2ML IJ SOLN
INTRAMUSCULAR | Status: DC | PRN
  Administered 2011-07-12: 4 mg via INTRAVENOUS

## 2011-07-12 MED ORDER — SODIUM CHLORIDE 0.9 % IV SOLN
INTRAVENOUS | Status: DC
  Administered 2011-07-12 (×2): via INTRAVENOUS

## 2011-07-12 MED ORDER — PROMETHAZINE HCL 25 MG/ML IJ SOLN
25.0000 mg | Freq: Four times a day (QID) | INTRAMUSCULAR | Status: AC | PRN
  Administered 2011-07-12: 25 mg via INTRAMUSCULAR

## 2011-07-12 MED ORDER — STERILE WATER FOR IRRIGATION IR SOLN
Status: DC | PRN
  Administered 2011-07-12: 1

## 2011-07-12 MED ORDER — FENTANYL CITRATE 0.05 MG/ML IJ SOLN
25.0000 ug | INTRAMUSCULAR | Status: DC | PRN
  Administered 2011-07-12: 50 ug via INTRAVENOUS

## 2011-07-12 MED ORDER — PROPOFOL 10 MG/ML IV EMUL
INTRAVENOUS | Status: DC | PRN
  Administered 2011-07-12: 50 ug/kg/min via INTRAVENOUS

## 2011-07-12 MED ORDER — CEFAZOLIN SODIUM 1-5 GM-% IV SOLN
1.0000 g | INTRAVENOUS | Status: AC
  Administered 2011-07-12: 1 g via INTRAVENOUS

## 2011-07-12 MED ORDER — PROMETHAZINE HCL 25 MG/ML IJ SOLN: 25.0000 mg | Freq: Once | INTRAMUSCULAR | Status: DC

## 2011-07-12 SURGICAL SUPPLY — 27 items
BAG DRAIN URO-CYSTO SKYTR STRL (DRAIN) ×2 IMPLANT
BAG DRN UROCATH (DRAIN) ×1
BALLN NEPHROSTOMY (BALLOONS)
BALLOON NEPHROSTOMY (BALLOONS) ×1 IMPLANT
CANISTER SUCT LVC 12 LTR MEDI- (MISCELLANEOUS) ×1 IMPLANT
CATH FOLEY 2WAY  5CC 16FR SIL (CATHETERS) ×1
CATH FOLEY 2WAY 5CC 16FR SIL (CATHETERS) IMPLANT
CATH FOLEY 2WAY SLVR  5CC 16FR (CATHETERS)
CATH FOLEY 2WAY SLVR 5CC 16FR (CATHETERS) IMPLANT
CLOTH BEACON ORANGE TIMEOUT ST (SAFETY) ×2 IMPLANT
DRAPE CAMERA CLOSED 9X96 (DRAPES) ×2 IMPLANT
ELECT REM PT RETURN 9FT ADLT (ELECTROSURGICAL)
ELECTRODE REM PT RTRN 9FT ADLT (ELECTROSURGICAL) ×1 IMPLANT
GLOVE INDICATOR 6.5 STRL GRN (GLOVE) ×2 IMPLANT
GLOVE SURG SS PI 8.0 STRL IVOR (GLOVE) ×2 IMPLANT
GOWN PREVENTION PLUS LG XLONG (DISPOSABLE) ×2 IMPLANT
GOWN STRL REIN XL XLG (GOWN DISPOSABLE) ×2 IMPLANT
GUIDEWIRE STR DUAL SENSOR (WIRE) ×1 IMPLANT
NDL SAFETY ECLIPSE 18X1.5 (NEEDLE) IMPLANT
NEEDLE HYPO 18GX1.5 SHARP (NEEDLE) ×2
NEEDLE HYPO 22GX1.5 SAFETY (NEEDLE) IMPLANT
NS IRRIG 500ML POUR BTL (IV SOLUTION) IMPLANT
PACK CYSTOSCOPY (CUSTOM PROCEDURE TRAY) ×2 IMPLANT
SYR 20CC LL (SYRINGE) IMPLANT
SYR 30ML LL (SYRINGE) IMPLANT
SYR 50ML LL SCALE MARK (SYRINGE) ×1 IMPLANT
WATER STERILE IRR 3000ML UROMA (IV SOLUTION) ×2 IMPLANT

## 2011-07-12 NOTE — OR Nursing (Signed)
Instillation of 60 ml to bladder of Tetracaine HCL 1:500

## 2011-07-12 NOTE — Discharge Instructions (Addendum)
Cystoscopy (Bladder Exam) Care After Refer to this sheet in the next few weeks. These discharge instructions provide you with general information on caring for yourself after you leave the hospital. Your caregiver may also give you specific instructions. Your treatment has been planned according to the most current medical practices available, but unavoidable complications sometimes occur. If you have any problems or questions after discharge, please call your caregiver. AFTER THE PROCEDURE   There may be temporary bleeding and burning with urination.   Drink enough water and fluids to keep your urine clear or pale yellow.  FINDING OUT THE RESULTS OF YOUR TEST Not all test results are available during your visit. If your test results are not back during the visit, make an appointment with your caregiver to find out the results. Do not assume everything is normal if you have not heard from your caregiver or the medical facility. It is important for you to follow up on all of your test results. SEEK IMMEDIATE MEDICAL CARE IF:   There is an increase in blood in the urine or you are passing clots.   There is difficulty passing urine.   You develop the chills.   You have an oral temperature above 102 F (38.9 C), not controlled by medicine.   Belly (abdominal) pain develops.  Document Released: 11/03/2004 Document Revised: 04/05/2011 Document Reviewed: 09/01/2007 [facility] Patient Information 2012 Cunningham, ZOX\[id number]\.

## 2011-07-12 NOTE — Op Note (Signed)
Brenda [name], Brenda [name]               ACCOUNT NO.:  [id number]  MEDICAL RECORD NO.:  [id number]  LOCATION:                                 FACILITY:  PHYSICIAN:  Excell Seltzer. Annabell Howells, M.D.    DATE OF BIRTH:  02/01/1951  DATE OF PROCEDURE: DATE OF DISCHARGE:                              OPERATIVE REPORT   PROCEDURE:  Cystoscopy, hydrodistention of bladder, urethral dilation, installation of Clorpactin and tetracaine.  PREOPERATIVE DIAGNOSIS:  Interstitial cystitis.  POSTOPERATIVE DIAGNOSIS:  Interstitial cystitis.  SURGEON:  Excell Seltzer. Annabell Howells, M.D.  ANESTHESIA:  MAC.  DRAINS:  16 French Silastic catheter.  COMPLICATIONS:  None.  INDICATIONS:  [name] is a 61 year old white female with a long history of interstitial cystitis, who is to undergo a repeat hydrodistention of bladder with instillation of Clorpactin,  FINDINGS OF PROCEDURE:  She was given 1 g of Ancef.  She was taken to the operating room where she was placed on the table in the lithotomy position.  Sedation was induced.  Her perineum and genitalia were prepped with Betadine solution and she was draped in the usual sterile fashion.  Cystoscopy was performed using a 22-French scope and 12 degree lens. Examination revealed a normal urethra, although there was some patulousness of the meatus with some redundant tissue.  No significant prolapse was identified.  The bladder had minimal trabeculation.  No tumors or stones were noted.  Ureteral orifices were unremarkable.  The bladder was dilated to capacity under 80 cm of water pressure, and then drained.  She held 450 mL.  The terminal efflux was bloody.  Repeat inspection following cystoscopy demonstrated glomerular hemorrhages in the trigone and posterior and lateral walls consistent with interstitial cystitis.  Her urethra was then calibrated to 32-French with female sounds.  A 16-French Silastic Foley catheter was inserted.  The balloon was filled with 10 mL of  sterile fluid.  The bladder was then instilled with 120 mL of Clorpactin 90 mg and 500 mL of sterile water with the dilution ratio.  This was left indwelling for 2 minutes, then drained.  Her bladder was then instilled with 60 mL of 1-500 tetracaine with 0.1% neomycin.  The catheter was then plugged.  She was taken down from lithotomy position and moved to the recovery room in stable condition.  There were no complications.     Excell Seltzer. Annabell Howells, M.D.     JJW/MEDQ  D:  07/12/2011  T:  07/12/2011  Job:  [id number]  cc:   Larina Earthly, M.D. Fax: [phone]

## 2011-07-12 NOTE — Interval H&P Note (Signed)
History and Physical Interval Note:  07/12/2011 7:21 AM  Brenda [name]  has presented today for surgery, with the diagnosis of INTERSTICIAL CYSTITIS  The various methods of treatment have been discussed with the patient and family. After consideration of risks, benefits and other options for treatment, the patient has consented to  Procedure(s) (LRB): CYSTOSCOPY WITH URETHRAL DILATATION (N/A) as a surgical intervention .  The patients' history has been reviewed, patient examined, no change in status, stable for surgery.  I have reviewed the patients' chart and labs.  Questions were answered to the patient's satisfaction.     [name]

## 2011-07-12 NOTE — Progress Notes (Signed)
Pt self instilled 60ml  tetracaine into foley cath after emptying urine & plugged cath

## 2011-07-12 NOTE — Brief Op Note (Signed)
07/12/2011  7:59 AM  PATIENT:  Brenda [name]  61 y.o. female  PRE-OPERATIVE DIAGNOSIS:  INTERSTICIAL CYSTITIS  POST-OPERATIVE DIAGNOSIS:  INTERSTITIAL CYSTITIS  PROCEDURE:  Procedure(s) (LRB): CYSTOSCOPY WITH HOD, URETHRAL DILATATION, INSTILLATION OF CLORPACTIN. (N/A)  SURGEON:  Surgeon(s) and Role:    * Anner Crete, MD - Primary  PHYSICIAN ASSISTANT:   ASSISTANTS: none   ANESTHESIA:   MAC  EBL:  Total I/O In: 100 [I.V.:100] Out: -   BLOOD ADMINISTERED:none  DRAINS: Urinary Catheter (Foley)   LOCAL MEDICATIONS USED:  OTHER Tetracaine.  SPECIMEN:  No Specimen  DISPOSITION OF SPECIMEN:  N/A  COUNTS:  YES  TOURNIQUET:  * No tourniquets in log *  DICTATION: .Other Dictation: Dictation Number [id number]  PLAN OF CARE: Discharge to home after PACU  PATIENT DISPOSITION:  PACU - hemodynamically stable.   Delay start of Pharmacological VTE agent (>24hrs) due to surgical blood loss or risk of bleeding: not applicable

## 2011-07-12 NOTE — Anesthesia Procedure Notes (Signed)
Procedure Name: MAC Date/Time: 07/12/2011 7:40 AM Performed by: Fran Lowes Pre-anesthesia Checklist: Patient identified, Timeout performed, Emergency Drugs available, Suction available and Patient being monitored Oxygen Delivery Method: Simple face mask Intubation Type: IV induction

## 2011-07-12 NOTE — Anesthesia Postprocedure Evaluation (Signed)
  Anesthesia Post-op Note  Patient: Brenda [name]  Procedure(s) Performed: Procedure(s) (LRB): CYSTOSCOPY WITH URETHRAL DILATATION (N/A)  Patient Location: PACU  Anesthesia Type: MAC  Level of Consciousness: awake and alert   Airway and Oxygen Therapy: Patient Spontanous Breathing  Post-op Pain: mild  Post-op Assessment: Post-op Vital signs reviewed, Patient's Cardiovascular Status Stable, Respiratory Function Stable, Patent Airway and No signs of Nausea or vomiting  Post-op Vital Signs: stable  Complications: No apparent anesthesia complications

## 2011-07-12 NOTE — Transfer of Care (Signed)
Immediate Anesthesia Transfer of Care Note  Patient: Brenda [name]  Procedure(s) Performed: Procedure(s) (LRB): CYSTOSCOPY WITH URETHRAL DILATATION (N/A)  Patient Location: Patient transported to PACU with oxygen via face mask at 4 Liters / Min  Anesthesia Type: MAC  Level of Consciousness: awake and alert   Airway & Oxygen Therapy: Patient Spontanous Breathing and Patient connected to face mask oxygen  Post-op Assessment: Report given to PACU RN and Post -op Vital signs reviewed and stable  Post vital signs: Reviewed and stable  Complications: No apparent anesthesia complications

## 2011-07-16 ENCOUNTER — Encounter (HOSPITAL_BASED_OUTPATIENT_CLINIC_OR_DEPARTMENT_OTHER): Payer: Self-pay | Admitting: Urology

## 2011-08-22 ENCOUNTER — Emergency Department (HOSPITAL_COMMUNITY)
Admission: EM | Admit: 2011-08-22 | Discharge: 2011-08-22 | Disposition: A | Discharge status: Home / Self Care | Payer: PRIVATE HEALTH INSURANCE | Attending: Emergency Medicine | Admitting: Emergency Medicine

## 2011-08-22 ENCOUNTER — Encounter (HOSPITAL_COMMUNITY): Payer: Self-pay | Admitting: *Deleted

## 2011-08-22 DIAGNOSIS — M549 Dorsalgia, unspecified: Secondary | ICD-10-CM

## 2011-08-22 DIAGNOSIS — E785 Hyperlipidemia, unspecified: Secondary | ICD-10-CM | POA: Insufficient documentation

## 2011-08-22 DIAGNOSIS — Z79899 Other long term (current) drug therapy: Secondary | ICD-10-CM | POA: Insufficient documentation

## 2011-08-22 DIAGNOSIS — K219 Gastro-esophageal reflux disease without esophagitis: Secondary | ICD-10-CM | POA: Insufficient documentation

## 2011-08-22 DIAGNOSIS — J45909 Unspecified asthma, uncomplicated: Secondary | ICD-10-CM | POA: Insufficient documentation

## 2011-08-22 DIAGNOSIS — K589 Irritable bowel syndrome without diarrhea: Secondary | ICD-10-CM | POA: Insufficient documentation

## 2011-08-22 MED ORDER — DEXAMETHASONE SODIUM PHOSPHATE 10 MG/ML IJ SOLN
10.0000 mg | Freq: Once | INTRAMUSCULAR | Status: AC
  Administered 2011-08-22: 10 mg via INTRAVENOUS
  Filled 2011-08-22: qty 1

## 2011-08-22 MED ORDER — CYCLOBENZAPRINE HCL 10 MG PO TABS: 10.0000 mg | ORAL_TABLET | Freq: Two times a day (BID) | ORAL | Status: AC | PRN

## 2011-08-22 MED ORDER — CYCLOBENZAPRINE HCL 10 MG PO TABS
10.0000 mg | ORAL_TABLET | Freq: Once | ORAL | Status: AC
  Administered 2011-08-22: 10 mg via ORAL
  Filled 2011-08-22: qty 1

## 2011-08-22 NOTE — ED Notes (Signed)
Pt states she has a "ruptured disk" that was discovered roughly 2 years ago.  Pain began last week. Around lunchtime today the pain increased to the point of being unbearable. Pt states that tylenol does not alleviate the pain; states sensation is equal bilaterally for lower limbs and upper limbs became "tingly" today. Pt states she has had 2 epidurals.

## 2011-08-22 NOTE — ED Provider Notes (Signed)
History     CSN: [id number]  Arrival date & time 08/22/11  1354   First MD Initiated Contact with Patient 08/22/11 1540      Chief Complaint  Patient presents with  . Back Pain    pt reports hx of ruptured disc, states "I had a coughing spell and it has caused it to hurt." c/o grabbing pain to lower back.     (Consider location/radiation/quality/duration/timing/severity/associated sxs/prior treatment) HPI Comments: Presents with complaint of right lower back pain is radiating to her right buttocks, area. She's had some chronic issues with her back off and on for last several years. She was previously seeing a neurosurgeon and has had previous epidural injections to her back. She, states it's been getting worse for the last few months. She states that today. She was coughing and felt an increased pulling sensation in her right lower back, radiating to her right buttocks area. Denies any further radiation down the leg. Denies any numbness or weakness in her legs. Denies a loss of bowel or bladder control. Denies any other recent injuries. Denies abdominal pain.  The history is provided by the patient.    Past Medical History  Diagnosis Date  . Anxiety   . Arthritis   . Asthma   . GERD (gastroesophageal reflux disease)   . Heart murmur   . Hyperlipidemia   . Interstitial cystitis   . Fibromyalgia   . IBS (irritable bowel syndrome)   . Spasm of sphincter of Oddi 1987  . Migraines   . Remote history of stroke 1996-   MILD  W/ NO RESIDUALS    AND PER SCAN CVA  IN 2010  (INFARCTION LEFT THALAMUS)  . History of pancreatitis 1988-1989  . Bladder pain   . Frequency of urination   . Urgency of urination   . Urethral stenosis S/P DILATIONS  . Complication of anesthesia PAIN ATTACKS    LACTATED RINGERS- CONTRAINDICATED  . Hypertension LABILE  . Normal cardiac stress test JULY 2009---  NORMAL    Past Surgical History  Procedure Date  . Tonsillectomy and adenoidectomy 1963  . Nasal  sinus surgery 1977    sinus cyst  . Rhinoplasty 1977  . Revision rhinoplasty 1978  . Bilateral turbinate resection 1990    nasal spreading graft  . Cystostomy w/ bladder biopsy 1983  . Dilation and curettage of uterus 1987  . Abdominal hysterectomy 1988  . Bilateral reduction mastopexy 1988  . Lasik APRIL 2005    BILATERAL  . Ercp 1988    for pancreatitis with stents   S/P SEVERE PANCREATITIS  . Colonoscopy 02/2004, 11/2010    2005: Normal - Dr. Sherin Quarry 2012: Normal  . Hemorrhoid surgery   . Multiple cysto/ hod/ urethral dilation/ instillation clorpactin .LAST ONE 03-31-2010  . Right salpingoophectomy 1998  . Appendectomy 04/ 1986  . Cholecystectomy 04/ 1986  . Cystoscopy with urethral dilatation 07/12/2011    Procedure: CYSTOSCOPY WITH URETHRAL DILATATION;  Surgeon: Anner Crete, MD;  Location: [facility];  Service: Urology;  Laterality: N/A;  HOD AND INSTILLATION OF CHLOROPACTIN C-ARM     Family History  Problem Relation Age of Onset  . Colon cancer Maternal Grandmother   . Stomach cancer Neg Hx   . Colon cancer Cousin     History  Substance Use Topics  . Smoking status: Never Smoker   . Smokeless tobacco: Never Used  . Alcohol Use: No    OB History    Grav Para  Term Preterm Abortions TAB SAB Ect Mult Living                  Review of Systems  Constitutional: Negative for fever, chills, diaphoresis and fatigue.  HENT: Negative for congestion, rhinorrhea and sneezing.   Eyes: Negative.   Respiratory: Negative for cough, chest tightness and shortness of breath.   Cardiovascular: Negative for chest pain and leg swelling.  Gastrointestinal: Negative for nausea, vomiting, abdominal pain, diarrhea and blood in stool.  Genitourinary: Negative for frequency, hematuria, flank pain and difficulty urinating.  Musculoskeletal: Positive for back pain. Negative for arthralgias.  Skin: Negative for rash.  Neurological: Negative for dizziness, speech  difficulty, weakness, numbness and headaches.    Allergies  Latex; Adhesive; Biaxin; Codeine; Cymbalta; Dilaudid; Elavil; Hydrocodone; Lactated ringers; Levbid; Marcaine; Percodan; Stadol; Talwin; Trazodone and nefazodone; Vistaril; and Morphine and related  Home Medications   Current Outpatient Rx  Name Route Sig Dispense Refill  . OMEGA-3 FATTY ACIDS 1000 MG PO CAPS Oral Take 1 g by mouth daily.     . RED YEAST RICE PO Oral Take by mouth.    Marland Kitchen CALCIUM + D PO Oral Take by mouth daily. 1 tablet daily    . CYCLOBENZAPRINE HCL 10 MG PO TABS Oral Take 1 tablet (10 mg total) by mouth 2 (two) times daily as needed for muscle spasms. 20 tablet 0  . ESTRADIOL 0.1 MG/24HR TD PTWK Transdermal Place 1 patch onto the skin once a week.    Marland Kitchen EZETIMIBE 10 MG PO TABS Oral Take 10 mg by mouth daily.     Marland Kitchen FLUOXETINE HCL 10 MG PO CAPS Oral Take 10 mg by mouth daily.      Marland Kitchen LISINOPRIL 20 MG PO TABS Oral Take 20 mg by mouth daily.      Marland Kitchen LORAZEPAM 1 MG PO TABS Oral Take 1 mg by mouth every 8 (eight) hours. Takes 1 to 2 tablets daily     . MEPERIDINE HCL 50 MG/ML IJ SOLN Intramuscular Inject 1 mL (50 mg total) into the muscle once. 1 mL 0  . METOPROLOL TARTRATE 50 MG PO TABS Oral Take 25 mg by mouth 2 (two) times daily.     Marland Kitchen PROMETHAZINE HCL 25 MG/ML IJ SOLN Intravenous Inject 1 mL (25 mg total) into the vein once. 1 mL 0  . VITAMIN D (ERGOCALCIFEROL) 50000 UNITS PO CAPS Oral Take 50,000 Units by mouth every 7 (seven) days.        BP 120/80  Pulse 64  Temp(Src) 97.5 F (36.4 C) (Oral)  Resp 18  SpO2 100%  Physical Exam  Constitutional: She is oriented to person, place, and time. She appears well-developed and well-nourished.  HENT:  Head: Normocephalic and atraumatic.  Eyes: Pupils are equal, round, and reactive to light.  Neck: Normal range of motion. Neck supple.  Cardiovascular: Normal rate, regular rhythm and normal heart sounds.   Pulmonary/Chest: Effort normal and breath sounds normal. No  respiratory distress. She has no wheezes. She has no rales. She exhibits no tenderness.  Abdominal: Soft. Bowel sounds are normal. There is no tenderness. There is no rebound and no guarding.  Musculoskeletal: Normal range of motion. She exhibits no edema.       Moderate tenderness to palpation in the right lower lumbar region in the paraspinal area and around the right SI joint. She has a positive straight leg raise on the right negative on the left. She has patellar reflexes 2+ bilaterally. She  has normal sensation to light touch in the feet. Normal motor function in the legs. Normal pulses in the feet  Lymphadenopathy:    She has no cervical adenopathy.  Neurological: She is alert and oriented to person, place, and time.  Skin: Skin is warm and dry. No rash noted.  Psychiatric: She has a normal mood and affect.    ED Course  Procedures (including critical care time)  Labs Reviewed - No data to display No results found.   1. Back pain       MDM  Pt with exacerbation of her LBP, likely HNP.  No neuro deficits or signs of cauda equina.  Pt only want to be treated with muscle relaxers.  Will also give shot of decadron.  Will refer to local neurosurgeon        Rolan Bucco, MD 08/22/11 [phone]

## 2011-08-22 NOTE — Discharge Instructions (Signed)
Back Pain, Adult Low back pain is very common. About 1 in 5 people have back pain.The cause of low back pain is rarely dangerous. The pain often gets better over time.About half of people with a sudden onset of back pain feel better in just 2 weeks. About 8 in 10 people feel better by 6 weeks.  CAUSES Some common causes of back pain include:  Strain of the muscles or ligaments supporting the spine.   Wear and tear (degeneration) of the spinal discs.   Arthritis.   Direct injury to the back.  DIAGNOSIS Most of the time, the direct cause of low back pain is not known.However, back pain can be treated effectively even when the exact cause of the pain is unknown.Answering your caregiver's questions about your overall health and symptoms is one of the most accurate ways to make sure the cause of your pain is not dangerous. If your caregiver needs more information, he or she may order lab work or imaging tests (X-rays or MRIs).However, even if imaging tests show changes in your back, this usually does not require surgery. HOME CARE INSTRUCTIONS For many people, back pain returns.Since low back pain is rarely dangerous, it is often a condition that people can learn to manageon their own.   Remain active. It is stressful on the back to sit or stand in one place. Do not sit, drive, or stand in one place for more than 30 minutes at a time. Take short walks on level surfaces as soon as pain allows.Try to increase the length of time you walk each day.   Do not stay in bed.Resting more than 1 or 2 days can delay your recovery.   Do not avoid exercise or work.Your body is made to move.It is not dangerous to be active, even though your back may hurt.Your back will likely heal faster if you return to being active before your pain is gone.   Pay attention to your body when you bend and lift. Many people have less discomfortwhen lifting if they bend their knees, keep the load close to their  bodies,and avoid twisting. Often, the most comfortable positions are those that put less stress on your recovering back.   Find a comfortable position to sleep. Use a firm mattress and lie on your side with your knees slightly bent. If you lie on your back, put a pillow under your knees.   Only take over-the-counter or prescription medicines as directed by your caregiver. Over-the-counter medicines to reduce pain and inflammation are often the most helpful.Your caregiver may prescribe muscle relaxant drugs.These medicines help dull your pain so you can more quickly return to your normal activities and healthy exercise.   Put ice on the injured area.   Put ice in a plastic bag.   Place a towel between your skin and the bag.   Leave the ice on for 15 to 20 minutes, 3 to 4 times a day for the first 2 to 3 days. After that, ice and heat may be alternated to reduce pain and spasms.   Ask your caregiver about trying back exercises and gentle massage. This may be of some benefit.   Avoid feeling anxious or stressed.Stress increases muscle tension and can worsen back pain.It is important to recognize when you are anxious or stressed and learn ways to manage it.Exercise is a great option.  SEEK MEDICAL CARE IF:  You have pain that is not relieved with rest or medicine.   You have   pain that does not improve in 1 week.   You have new symptoms.   You are generally not feeling well.  SEEK IMMEDIATE MEDICAL CARE IF:   You have pain that radiates from your back into your legs.   You develop new bowel or bladder control problems.   You have unusual weakness or numbness in your arms or legs.   You develop nausea or vomiting.   You develop abdominal pain.   You feel faint.  Document Released: 04/16/2005 Document Revised: 04/05/2011 Document Reviewed: 09/04/2010 [employer] Patient Information 2012 [employer]. 

## 2011-08-23 ENCOUNTER — Ambulatory Visit
Admission: RE | Admit: 2011-08-23 | Discharge: 2011-08-23 | Disposition: A | Discharge status: Home / Self Care | Payer: PRIVATE HEALTH INSURANCE | Source: Ambulatory Visit | Attending: Internal Medicine | Admitting: Internal Medicine

## 2011-08-23 ENCOUNTER — Other Ambulatory Visit: Payer: Self-pay | Admitting: Internal Medicine

## 2011-08-23 DIAGNOSIS — M5126 Other intervertebral disc displacement, lumbar region: Secondary | ICD-10-CM

## 2011-09-19 ENCOUNTER — Other Ambulatory Visit: Payer: Self-pay | Admitting: Neurological Surgery

## 2011-09-19 DIAGNOSIS — M5416 Radiculopathy, lumbar region: Secondary | ICD-10-CM

## 2011-09-19 DIAGNOSIS — M47816 Spondylosis without myelopathy or radiculopathy, lumbar region: Secondary | ICD-10-CM

## 2011-09-27 ENCOUNTER — Ambulatory Visit
Admission: RE | Admit: 2011-09-27 | Discharge: 2011-09-27 | Disposition: A | Discharge status: Home / Self Care | Payer: PRIVATE HEALTH INSURANCE | Source: Ambulatory Visit | Attending: Neurological Surgery | Admitting: Neurological Surgery

## 2011-09-27 DIAGNOSIS — M5416 Radiculopathy, lumbar region: Secondary | ICD-10-CM

## 2011-09-27 DIAGNOSIS — M47816 Spondylosis without myelopathy or radiculopathy, lumbar region: Secondary | ICD-10-CM

## 2011-09-27 MED ORDER — GADOBENATE DIMEGLUMINE 529 MG/ML IV SOLN
12.0000 mL | Freq: Once | INTRAVENOUS | Status: AC | PRN
  Administered 2011-09-27: 12 mL via INTRAVENOUS

## 2012-04-02 ENCOUNTER — Other Ambulatory Visit: Payer: Self-pay | Admitting: Obstetrics & Gynecology

## 2012-04-02 DIAGNOSIS — Z1231 Encounter for screening mammogram for malignant neoplasm of breast: Secondary | ICD-10-CM

## 2012-05-19 ENCOUNTER — Ambulatory Visit
Admission: RE | Admit: 2012-05-19 | Discharge: 2012-05-19 | Disposition: A | Discharge status: Home / Self Care | Payer: PRIVATE HEALTH INSURANCE | Source: Ambulatory Visit | Attending: Obstetrics & Gynecology | Admitting: Obstetrics & Gynecology

## 2012-05-19 DIAGNOSIS — Z1231 Encounter for screening mammogram for malignant neoplasm of breast: Secondary | ICD-10-CM

## 2012-06-18 ENCOUNTER — Other Ambulatory Visit: Payer: Self-pay | Admitting: Urology

## 2012-06-24 ENCOUNTER — Encounter (HOSPITAL_BASED_OUTPATIENT_CLINIC_OR_DEPARTMENT_OTHER): Payer: Self-pay | Admitting: *Deleted

## 2012-06-24 NOTE — Progress Notes (Addendum)
To [facility] at 0615 - Istat on arrival Ekg in epic-Npo after Mn-will take metoprolol,lorazepam with small amt water that am.

## 2012-06-25 NOTE — H&P (Signed)
ctive Problems Problems  1. Abrasion Of Hands 914.0 2. 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   Past Medical History Problems  1. History of  Anxiety (Symptom) 300.00 2. History of  Asthma 493.90 3. History of  Bladder Irrigation 4. History of  Bladder Irrigation 5. History of  Bladder Irrigation 6. History of  Breast Fibrocystic Disease 610.1 7. History of  Complete Colonoscopy 8. History of  Endometriosis 617.9 9. History of  Endoscopic Retrograde Cholangiopancreatography (ERCP) 10. History of  Fibromyalgia 729.1 11. History of  Hypercholesterolemia 272.0 12. History of  Hypertension 401.9 13. History of  Irritable Bowel Syndrome 564.1 14. History of  Migraine Headache 346.90 15. History of  Pancreatitis 577.9 16. History of  Serum Enzyme Levels - ALT (SGPT) Elevated 790.4 17. History of  Serum Enzyme Levels - AST (SGOT) Elevated 790.4 18. History of  Spasm Of Sphincter Of Oddi 576.5 19. History of  Stroke Syndrome 436  Surgical History Problems  1. History of  Appendectomy 2. History of  Bladder Irrigation 3. History of  Breast Surgery Mastopexy Bilateral 4. History of  Breast Surgery Reduction Procedure Bilateral 5. History of  Cholecystectomy 6. History of  Corneal LASIK Bilateral 7. History of  Cystoscopy For Urethral Stricture 8. History of  Cystoscopy With Biopsy 9. History of  Cystoscopy With Dilation Of Bladder 10. History of  Cystoscopy With Dilation Of Bladder 11. History of  Cystoscopy With Dilation Of Bladder 12. History of  Cystoscopy With Dilation Of Bladder 13. History of  Cystoscopy With Dilation Of Bladder 14. History of  Dilation And Curettage 15. History of  Hysterectomy V45.77 16. History of   Liver Biopsy 17. History of  Nasal Septal Deviation Repair 18. History of  Nose Surgery 19. History of  Oophorectomy Bilateral V45.77 20. History of  Tonsillectomy With Adenoidectomy  Current Meds 1. Anesthetic Cocktail - Bladder Instillation; anesthetic cocktail  #322  Lidocaine 2%  10 ml, sod  bicarb 8.4%  5 ml  and Heparin 5,000 units/ml  8 ml; Therapy: 20Jan2011 to (Last Rx:26Mar2012) 2. Calcium + D TABS; Therapy: (Recorded:26Aug2010) to 3. Folic Acid 1 MG Oral Tablet; Therapy: (Recorded:30Aug2013) to 4. Lexapro 5 MG Oral Tablet; Therapy: (Recorded:31Jan2014) to 5. Lisinopril 20 MG Oral Tablet; Therapy: (Recorded:26Aug2010) to 6. LORazepam 1 MG Oral Tablet; Therapy: (Recorded:26Aug2010) to 7. Metoprolol Tartrate 50 MG Oral Tablet; Therapy: (Recorded:26Aug2010) to 8. Minivelle 0.1 MG/24HR Transdermal Patch Biweekly; Therapy: (Recorded:27Dec2013) to 9. Vitamin D (Ergocalciferol) 50000 UNIT Oral Capsule; Therapy: (Recorded:30Jan2012) to 10. Zetia 10 MG Oral Tablet; Therapy: (Recorded:26Aug2010) to  Allergies Medication  1. Codeine Derivatives 2. Dilaudid TABS 3. Marcaine SOLN 4. Morphine Derivatives 5. Stadol SOLN  Family History Problems  1. Maternal history of  Acute Myocardial Infarction V17.3 2. Paternal history of  Bladder Cancer V16.52 3. Maternal history of  Congestive Heart Failure 4. Maternal history of  Coronary Artery Disease 5. Family history of  Death In The Family Father age 66 of brain tumor 33. Family history of  Death In The Family Mother age 69 of respiratory failure and pneumonia 7. Maternal history of  Diabetes Mellitus V18.0 8. Sororal history of  Diabetes Mellitus V18.0 9. Family history of  Family Health Status Number Of Children No children 10. Maternal history of  Fibromyalgia 11. Maternal history of  Gastric Ulcer 12. Paternal history of  Glioblastoma Multiforme (Grade IV) Of The Brain 13. Maternal history of  Heart Disease V17.49 14. Paternal  history of  Hypercholesterolemia 15. Sororal history of  Malignant Melanoma Of The Skin V16.8 16. Maternal history of  Skin Cancer V16.8 17. Paternal history of  Skin Cancer V16.8 18. Maternal history of  Stroke Syndrome V17.1  Social History Problems  1. Caffeine Use 2-3 a day 2. Marital History - Currently Married 3. Never A Smoker 4. 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 History of  Alcohol Use 6. History of  Tobacco Use  Review of Systems  Genitourinary: no dysuria and no hematuria.  Cardiovascular: no chest pain.  Respiratory: no shortness of breath.    Physical Exam Constitutional: Well nourished and well developed . No acute distress.  Pulmonary: No respiratory distress and normal respiratory rhythm and effort.  Cardiovascular: Heart rate and rhythm are normal . No peripheral edema.    Assessment Assessed  1. 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   Plan  I will proceed with cystoscopy, HOD, urethral dilation and instillation of clorpactin.  We have reviewed the risks of the procedure.

## 2012-06-26 ENCOUNTER — Ambulatory Visit (HOSPITAL_BASED_OUTPATIENT_CLINIC_OR_DEPARTMENT_OTHER)
Admission: RE | Admit: 2012-06-26 | Discharge: 2012-06-26 | Disposition: A | Discharge status: Home / Self Care | Payer: PRIVATE HEALTH INSURANCE | Source: Ambulatory Visit | Attending: Urology | Admitting: Urology

## 2012-06-26 ENCOUNTER — Encounter (HOSPITAL_BASED_OUTPATIENT_CLINIC_OR_DEPARTMENT_OTHER): Payer: Self-pay | Admitting: Anesthesiology

## 2012-06-26 ENCOUNTER — Encounter (HOSPITAL_BASED_OUTPATIENT_CLINIC_OR_DEPARTMENT_OTHER)
Admission: RE | Disposition: A | Discharge status: Home / Self Care | Payer: Self-pay | Source: Ambulatory Visit | Attending: Urology

## 2012-06-26 ENCOUNTER — Encounter (HOSPITAL_BASED_OUTPATIENT_CLINIC_OR_DEPARTMENT_OTHER): Payer: Self-pay | Admitting: *Deleted

## 2012-06-26 ENCOUNTER — Ambulatory Visit (HOSPITAL_BASED_OUTPATIENT_CLINIC_OR_DEPARTMENT_OTHER): Payer: PRIVATE HEALTH INSURANCE | Admitting: Anesthesiology

## 2012-06-26 DIAGNOSIS — I10 Essential (primary) hypertension: Secondary | ICD-10-CM | POA: Insufficient documentation

## 2012-06-26 DIAGNOSIS — E7800 Pure hypercholesterolemia, unspecified: Secondary | ICD-10-CM | POA: Insufficient documentation

## 2012-06-26 DIAGNOSIS — Z8673 Personal history of transient ischemic attack (TIA), and cerebral infarction without residual deficits: Secondary | ICD-10-CM | POA: Insufficient documentation

## 2012-06-26 DIAGNOSIS — N393 Stress incontinence (female) (male): Secondary | ICD-10-CM | POA: Insufficient documentation

## 2012-06-26 DIAGNOSIS — IMO0001 Reserved for inherently not codable concepts without codable children: Secondary | ICD-10-CM | POA: Insufficient documentation

## 2012-06-26 DIAGNOSIS — Z79899 Other long term (current) drug therapy: Secondary | ICD-10-CM | POA: Insufficient documentation

## 2012-06-26 DIAGNOSIS — J45909 Unspecified asthma, uncomplicated: Secondary | ICD-10-CM | POA: Insufficient documentation

## 2012-06-26 DIAGNOSIS — N3010 Interstitial cystitis (chronic) without hematuria: Secondary | ICD-10-CM | POA: Insufficient documentation

## 2012-06-26 DIAGNOSIS — K219 Gastro-esophageal reflux disease without esophagitis: Secondary | ICD-10-CM | POA: Insufficient documentation

## 2012-06-26 DIAGNOSIS — N8111 Cystocele, midline: Secondary | ICD-10-CM | POA: Insufficient documentation

## 2012-06-26 HISTORY — PX: CYSTOSCOPY WITH URETHRAL DILATATION: SHX5125

## 2012-06-26 LAB — POCT I-STAT 4, (NA,K, GLUC, HGB,HCT)
Glucose, Bld: 90 mg/dL (ref 70–99)
HCT: 44 % (ref 36.0–46.0)
Hemoglobin: 15 g/dL (ref 12.0–15.0)
Potassium: 3.9 mEq/L (ref 3.5–5.1)
Sodium: 142 mEq/L (ref 135–145)

## 2012-06-26 SURGERY — CYSTOSCOPY, WITH URETHRAL DILATION
Anesthesia: Monitor Anesthesia Care

## 2012-06-26 MED ORDER — MEPERIDINE HCL 50 MG/ML IJ SOLN
50.0000 mg | Freq: Once | INTRAMUSCULAR | Status: AC
  Administered 2012-06-26: 50 mg via INTRAMUSCULAR
  Filled 2012-06-26: qty 1

## 2012-06-26 MED ORDER — STERILE WATER FOR IRRIGATION IR SOLN
Status: DC | PRN
  Administered 2012-06-26: 3000 mL

## 2012-06-26 MED ORDER — PROMETHAZINE HCL 25 MG/ML IJ SOLN
25.0000 mg | Freq: Once | INTRAMUSCULAR | Status: AC
  Administered 2012-06-26: 25 mg via INTRAMUSCULAR
  Filled 2012-06-26: qty 1

## 2012-06-26 MED ORDER — MEPERIDINE HCL 50 MG/ML IJ SOLN
50.0000 mg | Freq: Once | INTRAMUSCULAR | Status: DC
  Filled 2012-06-26: qty 1

## 2012-06-26 MED ORDER — CEFAZOLIN SODIUM-DEXTROSE 2-3 GM-% IV SOLR
2.0000 g | INTRAVENOUS | Status: AC
  Administered 2012-06-26: 2 g via INTRAVENOUS
  Filled 2012-06-26: qty 50

## 2012-06-26 MED ORDER — IOHEXOL 350 MG/ML SOLN
INTRAVENOUS | Status: DC | PRN
  Administered 2012-06-26: 1 mL

## 2012-06-26 MED ORDER — STERILE WATER FOR INJECTION IJ SOLN
INTRAMUSCULAR | Status: DC | PRN
  Administered 2012-06-26: 08:00:00 via INTRAVESICAL

## 2012-06-26 MED ORDER — ONDANSETRON HCL 4 MG/2ML IJ SOLN
4.0000 mg | Freq: Once | INTRAMUSCULAR | Status: DC | PRN
  Filled 2012-06-26: qty 2

## 2012-06-26 MED ORDER — SODIUM CHLORIDE 0.9 % IJ SOLN
3.0000 mL | INTRAMUSCULAR | Status: DC | PRN
  Filled 2012-06-26: qty 3

## 2012-06-26 MED ORDER — FENTANYL CITRATE 0.05 MG/ML IJ SOLN
25.0000 ug | INTRAMUSCULAR | Status: DC | PRN
  Administered 2012-06-26 (×4): 25 ug via INTRAVENOUS
  Filled 2012-06-26: qty 1

## 2012-06-26 MED ORDER — PROPOFOL 10 MG/ML IV BOLUS
INTRAVENOUS | Status: DC | PRN
  Administered 2012-06-26: 100 mg via INTRAVENOUS
  Administered 2012-06-26: 50 mg via INTRAVENOUS

## 2012-06-26 MED ORDER — LIDOCAINE HCL (CARDIAC) 20 MG/ML IV SOLN
INTRAVENOUS | Status: DC | PRN
  Administered 2012-06-26 (×2): 25 mg via INTRAVENOUS
  Administered 2012-06-26: 50 mg via INTRAVENOUS

## 2012-06-26 MED ORDER — MIDAZOLAM HCL 5 MG/5ML IJ SOLN
INTRAMUSCULAR | Status: DC | PRN
  Administered 2012-06-26: 2 mg via INTRAVENOUS

## 2012-06-26 MED ORDER — CEFAZOLIN SODIUM 1-5 GM-% IV SOLN
1.0000 g | INTRAVENOUS | Status: DC
  Filled 2012-06-26: qty 50

## 2012-06-26 MED ORDER — SODIUM CHLORIDE 0.9 % IJ SOLN
3.0000 mL | Freq: Two times a day (BID) | INTRAMUSCULAR | Status: DC
  Filled 2012-06-26: qty 3

## 2012-06-26 MED ORDER — FENTANYL CITRATE 0.05 MG/ML IJ SOLN
INTRAMUSCULAR | Status: DC | PRN
  Administered 2012-06-26 (×2): 50 ug via INTRAVENOUS

## 2012-06-26 MED ORDER — ONDANSETRON HCL 4 MG/2ML IJ SOLN
INTRAMUSCULAR | Status: DC | PRN
  Administered 2012-06-26: 4 mg via INTRAVENOUS

## 2012-06-26 MED ORDER — ACETAMINOPHEN 10 MG/ML IV SOLN
INTRAVENOUS | Status: DC | PRN
  Administered 2012-06-26: 1000 mg via INTRAVENOUS

## 2012-06-26 MED ORDER — ONDANSETRON HCL 4 MG/2ML IJ SOLN
4.0000 mg | Freq: Four times a day (QID) | INTRAMUSCULAR | Status: DC | PRN
  Filled 2012-06-26: qty 2

## 2012-06-26 MED ORDER — PROMETHAZINE HCL 25 MG/ML IJ SOLN
25.0000 mg | Freq: Once | INTRAMUSCULAR | Status: DC
  Filled 2012-06-26: qty 1

## 2012-06-26 MED ORDER — ACETAMINOPHEN 650 MG RE SUPP
650.0000 mg | RECTAL | Status: DC | PRN
  Filled 2012-06-26: qty 1

## 2012-06-26 MED ORDER — SODIUM CHLORIDE 0.9 % IV SOLN
INTRAVENOUS | Status: DC
  Administered 2012-06-26: 07:00:00 via INTRAVENOUS
  Filled 2012-06-26: qty 1000

## 2012-06-26 MED ORDER — ACETAMINOPHEN 10 MG/ML IV SOLN
1000.0000 mg | Freq: Once | INTRAVENOUS | Status: DC | PRN
  Filled 2012-06-26: qty 100

## 2012-06-26 MED ORDER — ACETAMINOPHEN 325 MG PO TABS
650.0000 mg | ORAL_TABLET | ORAL | Status: DC | PRN
  Filled 2012-06-26: qty 2

## 2012-06-26 MED ORDER — MEPERIDINE HCL 25 MG/ML IJ SOLN
6.2500 mg | INTRAMUSCULAR | Status: DC | PRN
  Filled 2012-06-26: qty 1

## 2012-06-26 MED ORDER — SODIUM CHLORIDE 0.9 % IV SOLN
250.0000 mL | INTRAVENOUS | Status: DC | PRN
  Filled 2012-06-26: qty 250

## 2012-06-26 SURGICAL SUPPLY — 31 items
BAG DRAIN URO-CYSTO SKYTR STRL (DRAIN) ×2 IMPLANT
BAG DRN UROCATH (DRAIN) ×1
BAG URINE DRAINAGE (UROLOGICAL SUPPLIES) ×1 IMPLANT
BALLN NEPHROSTOMY (BALLOONS) ×2
BALLOON NEPHROSTOMY (BALLOONS) ×1 IMPLANT
CANISTER SUCT LVC 12 LTR MEDI- (MISCELLANEOUS) ×1 IMPLANT
CATH FOLEY 2WAY SLVR  5CC 16FR (CATHETERS)
CATH FOLEY 2WAY SLVR 5CC 16FR (CATHETERS) IMPLANT
CATH SILICONE 16FRX5CC (CATHETERS) ×1 IMPLANT
CLOTH BEACON ORANGE TIMEOUT ST (SAFETY) ×2 IMPLANT
DRAPE CAMERA CLOSED 9X96 (DRAPES) ×2 IMPLANT
ELECT REM PT RETURN 9FT ADLT (ELECTROSURGICAL)
ELECTRODE REM PT RTRN 9FT ADLT (ELECTROSURGICAL) ×1 IMPLANT
ERX10323 DRUG ×1 IMPLANT
GLOVE INDICATOR 6.5 STRL GRN (GLOVE) ×1 IMPLANT
GLOVE SKINSENSE STRL SZ6.0 (GLOVE) ×1 IMPLANT
GLOVE SURG SS PI 8.0 STRL IVOR (GLOVE) ×2 IMPLANT
GOWN PREVENTION PLUS LG XLONG (DISPOSABLE) ×2 IMPLANT
GOWN STRL REIN XL XLG (GOWN DISPOSABLE) ×2 IMPLANT
GUIDEWIRE STR DUAL SENSOR (WIRE) IMPLANT
HOLDER FOLEY CATH W/STRAP (MISCELLANEOUS) ×1 IMPLANT
NDL SAFETY ECLIPSE 18X1.5 (NEEDLE) IMPLANT
NEEDLE HYPO 18GX1.5 SHARP (NEEDLE)
NEEDLE HYPO 22GX1.5 SAFETY (NEEDLE) IMPLANT
NS IRRIG 500ML POUR BTL (IV SOLUTION) IMPLANT
PACK CYSTOSCOPY (CUSTOM PROCEDURE TRAY) ×2 IMPLANT
SYR 20CC LL (SYRINGE) IMPLANT
SYR 30ML LL (SYRINGE) IMPLANT
SYR BULB IRRIGATION 50ML (SYRINGE) ×1 IMPLANT
WATER STERILE IRR 3000ML UROMA (IV SOLUTION) ×2 IMPLANT
WATER STERILE IRR 500ML POUR (IV SOLUTION) ×1 IMPLANT

## 2012-06-26 NOTE — OR Nursing (Signed)
Tetracaine HCL  1:500 mixed with 0.1% Neomycin used as irrigation for bladder, 60 ml.  Carmel Sacramento, RN, CNOR

## 2012-06-26 NOTE — Interval H&P Note (Signed)
History and Physical Interval Note:  06/26/2012 7:23 AM  Brenda [name]  has presented today for surgery, with the diagnosis of interstitial cystis   The various methods of treatment have been discussed with the patient and family. After consideration of risks, benefits and other options for treatment, the patient has consented to  Procedure(s) with comments: CYSTOSCOPY WITH URETHRAL DILATATION HYDRODISTENSION AND INSTILLATION OF CLORPACTION  (N/A) - YSTOSCOPY WITH URETHRAL DILATATION HYDRODISTENSION AND INSTILLATION OF CLORPACTION  as a surgical intervention .  The patient's history has been reviewed, patient examined, no change in status, stable for surgery.  I have reviewed the patient's chart and labs.  Questions were answered to the patient's satisfaction.     [name]

## 2012-06-26 NOTE — Transfer of Care (Signed)
Immediate Anesthesia Transfer of Care Note  Patient: Brenda [name]  Procedure(s) Performed: Procedure(s) (LRB): CYSTOSCOPY WITH URETHRAL DILATATION HYDRODISTENSION AND INSTILLATION OF CLORPACTION  (N/A)  Patient Location: PACU  Anesthesia Type: MAC  Level of Consciousness: awake, alert  and oriented  Airway & Oxygen Therapy: Patient Spontanous Breathing and Patient connected to face mask oxygen  Post-op Assessment: Report given to PACU RN and Post -op Vital signs reviewed and stable  Post vital signs: Reviewed and stable  Complications: No apparent anesthesia complications

## 2012-06-26 NOTE — Anesthesia Procedure Notes (Signed)
Procedure Name: MAC Date/Time: 06/26/2012 7:37 AM Performed by: Norva Pavlov Pre-anesthesia Checklist: Patient identified, Timeout performed, Emergency Drugs available, Suction available and Patient being monitored Oxygen Delivery Method: Simple face mask Intubation Type: IV induction Airway Equipment and Method: Oral airway

## 2012-06-26 NOTE — Anesthesia Postprocedure Evaluation (Signed)
Anesthesia Post Note  Patient: Brenda [name]  Procedure(s) Performed: Procedure(s) (LRB): CYSTOSCOPY WITH URETHRAL DILATATION HYDRODISTENSION AND INSTILLATION OF CLORPACTION  (N/A)  Anesthesia type: MAC  Patient location: PACU  Post pain: Pain level controlled  Post assessment: Post-op Vital signs reviewed  Last Vitals: BP 117/73  Pulse 59  Temp(Src) 36.3 C (Oral)  Resp 14  Ht 5' (1.524 m)  Wt 140 lb (63.504 kg)  BMI 27.34 kg/m2  SpO2 99%  Post vital signs: Reviewed  Level of consciousness: awake  Complications: No apparent anesthesia complications

## 2012-06-26 NOTE — Op Note (Deleted)
[name], METZNER               ACCOUNT NO.:  [id number]  MEDICAL RECORD NO.:  [id number]  LOCATION:  WA09                         FACILITY:  [facility]  PHYSICIAN:  Excell Seltzer. Annabell Howells, M.D.    DATE OF BIRTH:  1950-10-05  DATE OF PROCEDURE:  06/26/2012 DATE OF DISCHARGE:  06/26/2012                              OPERATIVE REPORT   PROCEDURE: 1. Pelvic exam. 2. Cystoscopy with hydrodistention of the bladder, installation of     Clorpactin, and urethral dilation.  PREOPERATIVE DIAGNOSIS:  Interstitial cystitis.  POSTOPERATIVE DIAGNOSIS:  Interstitial cystitis.  SURGEON:  Excell Seltzer. Annabell Howells, MD  ANESTHESIA:  MAC.  DRAINS:  A 16-French Silastic Foley catheter.  COMPLICATIONS:  None.  INDICATIONS:  Brenda [name] is a 62 year old white female with long history of interstitial cystitis and urethral stenosis who has had recurrent symptoms and is to undergo a therapeutic cystoscopy.  FINDINGS OF PROCEDURE:  She was taken to the operating room where she was given a gram of Ancef and IV Tylenol.  She was placed in lithotomy position.  A pelvic exam was performed.  This revealed mild introital stenosis.  She has a grade 2 rectocele and urethral hypermobility with a grade 1 cystocele but no caruncle or urethral prolapse.  She does have stress incontinence with straining.  After the exam, her perineum, vagina, and genitalia were prepped with Betadine solution and she was draped in usual sterile fashion.  MAC anesthesia was then performed.  Once she was adequately sedated, cystoscopy was performed using a 22-French scope and the bladder was then filled under 80 cm of water pressure to capacity.  Inspection of the bladder prior to hydrodistention demonstrated a normal urethra.  The ureteral orifices were unremarkable.  The bladder wall had some mild erythema consistent with increased vascularity, but no tumors or stones were noted.  There was only mild trabeculation.  The bladder was then filled to  capacity.  This was held for 2 minutes. The bladder was drained to capacity under anesthesia was approximately 300 mL.  Repeat cystoscopy after hydrodistention revealed diffuse glomerulations with mucosal bleeding consistent with interstitial cystitis.  At this point, the urethra was calibrated to 34-French with female sounds.  There was no significant stenosis during calibration.  A 16-French Silastic Foley catheter was then inserted, and the bladder was instilled with 180 mL of Clorpactin solution prepared 1 ampule Clorpactin and 500 mL of water.  This was left indwelling for 2 minutes and the bladder was then drained.  The bladder was then irrigated using water.  At this point, the bladder was instilled with 60 mL of 0.1% tetracaine for postop pain and spasm control.  This was left indwelling for 2 minutes, the bladder was then drained, and placed to straight drainage. Her sedation was then reversed.  She was taken down from lithotomy position and moved to recovery room in stable condition.  There were no complications.     Excell Seltzer. Annabell Howells, M.D.     JJW/MEDQ  D:  06/26/2012  T:  06/26/2012  Job:  [id number]

## 2012-06-26 NOTE — Anesthesia Preprocedure Evaluation (Addendum)
Anesthesia Evaluation  Patient identified by MRN, date of birth, ID band Patient awake    Reviewed: Allergy & Precautions, H&P , NPO status , Patient's Chart, lab work & pertinent test results, reviewed documented beta blocker date and time   History of Anesthesia Complications Negative for: history of anesthetic complications  Airway Mallampati: II TM Distance: >3 FB Neck ROM: full    Dental  (+) Caps and Dental Advisory Given,    Pulmonary asthma ,  breath sounds clear to auscultation  Pulmonary exam normal       Cardiovascular Exercise Tolerance: Good hypertension, Pt. on home beta blockers and Pt. on medications Rhythm:regular Rate:Normal  Normal stress test 2009   Neuro/Psych  Headaches, Anxiety Panic attacks in pastCVA 1996 and 2009.  No known residual. CVA, No Residual Symptoms negative psych ROS   GI/Hepatic negative GI ROS, Neg liver ROS, GERD-  Controlled,  Endo/Other  negative endocrine ROS  Renal/GU negative Renal ROS  negative genitourinary   Musculoskeletal  (+) Fibromyalgia -  Abdominal   Peds  Hematology negative hematology ROS (+)   Anesthesia Other Findings Lactated Ringers contraindicated according to history due to a psychiatrist telling her it might be causing her panic attacks.  Reproductive/Obstetrics negative OB ROS                          Anesthesia Physical  Anesthesia Plan  ASA: III  Anesthesia Plan: MAC   Post-op Pain Management:    Induction: Intravenous  Airway Management Planned: Simple Face Mask  Additional Equipment:   Intra-op Plan:   Post-operative Plan:   Informed Consent: I have reviewed the patients History and Physical, chart, labs and discussed the procedure including the risks, benefits and alternatives for the proposed anesthesia with the patient or authorized representative who has indicated his/her understanding and acceptance.    Dental Advisory Given  Plan Discussed with: CRNA  Anesthesia Plan Comments: (A psychiatrist told her LR might have caused her panic attacks with previous surgeries years ago.  No problem for the last few years.)       Anesthesia Quick Evaluation

## 2012-06-26 NOTE — Op Note (Signed)
NAME:  [name], Brenda               ACCOUNT NO.:  [id number]  MEDICAL RECORD NO.:  [id number]  LOCATION:  WA09                         FACILITY:  [facility]  PHYSICIAN:  Aneshia Jacquet J. Arnol Mcgibbon, M.D.    DATE OF BIRTH:  02/17/1951  DATE OF PROCEDURE:  06/26/2012 DATE OF DISCHARGE:  06/26/2012                              OPERATIVE REPORT   PROCEDURE: 1. Pelvic exam. 2. Cystoscopy with hydrodistention of the bladder, installation of     Clorpactin, and urethral dilation.  PREOPERATIVE DIAGNOSIS:  Interstitial cystitis.  POSTOPERATIVE DIAGNOSIS:  Interstitial cystitis.  SURGEON:  Adedamola Seto J. Bayani Renteria, MD  ANESTHESIA:  MAC.  DRAINS:  A 16-French Silastic Foley catheter.  COMPLICATIONS:  None.  INDICATIONS:  Brenda [name] is a 61-year-old white female with long history of interstitial cystitis and urethral stenosis who has had recurrent symptoms and is to undergo a therapeutic cystoscopy.  FINDINGS OF PROCEDURE:  She was taken to the operating room where she was given a gram of Ancef and IV Tylenol.  She was placed in lithotomy position.  A pelvic exam was performed.  This revealed mild introital stenosis.  She has a grade 2 rectocele and urethral hypermobility with a grade 1 cystocele but no caruncle or urethral prolapse.  She does have stress incontinence with straining.  After the exam, her perineum, vagina, and genitalia were prepped with Betadine solution and she was draped in usual sterile fashion.  MAC anesthesia was then performed.  Once she was adequately sedated, cystoscopy was performed using a 22-French scope and the bladder was then filled under 80 cm of water pressure to capacity.  Inspection of the bladder prior to hydrodistention demonstrated a normal urethra.  The ureteral orifices were unremarkable.  The bladder wall had some mild erythema consistent with increased vascularity, but no tumors or stones were noted.  There was only mild trabeculation.  The bladder was then filled to  capacity.  This was held for 2 minutes. The bladder was drained to capacity under anesthesia was approximately 300 mL.  Repeat cystoscopy after hydrodistention revealed diffuse glomerulations with mucosal bleeding consistent with interstitial cystitis.  At this point, the urethra was calibrated to 34-French with female sounds.  There was no significant stenosis during calibration.  A 16-French Silastic Foley catheter was then inserted, and the bladder was instilled with 180 mL of Clorpactin solution prepared 1 ampule Clorpactin and 500 mL of water.  This was left indwelling for 2 minutes and the bladder was then drained.  The bladder was then irrigated using water.  At this point, the bladder was instilled with 60 mL of 0.1% tetracaine for postop pain and spasm control.  This was left indwelling for 2 minutes, the bladder was then drained, and placed to straight drainage. Her sedation was then reversed.  She was taken down from lithotomy position and moved to recovery room in stable condition.  There were no complications.     Sydna Brodowski J. Della Scrivener, M.D.     JJW/MEDQ  D:  06/26/2012  T:  06/26/2012  Job:  [id number] 

## 2012-06-26 NOTE — Brief Op Note (Signed)
06/26/2012  7:51 AM  PATIENT:  Brenda [name]  62 y.o. female  PRE-OPERATIVE DIAGNOSIS:  interstitial cystis   POST-OPERATIVE DIAGNOSIS:  same  PROCEDURE:  Procedure(s) with comments: CYSTOSCOPY WITH URETHRAL DILATATION HYDRODISTENSION AND INSTILLATION OF CLORPACTION  (N/A) - YSTOSCOPY WITH URETHRAL DILATATION HYDRODISTENSION AND INSTILLATION OF CLORPACTION   SURGEON:  Surgeon(s) and Role:    * Anner Crete, MD - Primary  PHYSICIAN ASSISTANT:   ASSISTANTS: none   ANESTHESIA:   MAC  EBL:  Total I/O In: 200 [I.V.:200] Out: -   BLOOD ADMINISTERED:none  DRAINS: Urinary Catheter (Foley)   LOCAL MEDICATIONS USED:  OTHER tetracaine 0.1% 60cc bladder instillation  SPECIMEN:  No Specimen  DISPOSITION OF SPECIMEN:  N/A  COUNTS:  YES  TOURNIQUET:  * No tourniquets in log *  DICTATION: .Other Dictation: Dictation Number [id number]  PLAN OF CARE: Discharge to home after PACU  PATIENT DISPOSITION:  PACU - hemodynamically stable.   Delay start of Pharmacological VTE agent (>24hrs) due to surgical blood loss or risk of bleeding: not applicable

## 2012-06-27 ENCOUNTER — Encounter (HOSPITAL_BASED_OUTPATIENT_CLINIC_OR_DEPARTMENT_OTHER): Payer: Self-pay | Admitting: Urology

## 2012-07-24 ENCOUNTER — Ambulatory Visit
Admission: RE | Admit: 2012-07-24 | Discharge: 2012-07-24 | Disposition: A | Discharge status: Home / Self Care | Payer: PRIVATE HEALTH INSURANCE | Source: Ambulatory Visit | Attending: Adult Health | Admitting: Adult Health

## 2012-07-24 ENCOUNTER — Other Ambulatory Visit: Payer: Self-pay | Admitting: Adult Health

## 2012-07-24 DIAGNOSIS — R062 Wheezing: Secondary | ICD-10-CM

## 2013-04-14 ENCOUNTER — Other Ambulatory Visit: Payer: Self-pay

## 2013-04-14 DIAGNOSIS — Z1231 Encounter for screening mammogram for malignant neoplasm of breast: Secondary | ICD-10-CM

## 2013-05-26 ENCOUNTER — Ambulatory Visit
Admission: RE | Admit: 2013-05-26 | Discharge: 2013-05-26 | Disposition: A | Discharge status: Home / Self Care | Payer: PRIVATE HEALTH INSURANCE | Source: Ambulatory Visit

## 2013-05-26 DIAGNOSIS — Z1231 Encounter for screening mammogram for malignant neoplasm of breast: Secondary | ICD-10-CM

## 2013-07-09 ENCOUNTER — Encounter (HOSPITAL_COMMUNITY): Payer: Self-pay | Admitting: Emergency Medicine

## 2013-07-09 ENCOUNTER — Emergency Department (HOSPITAL_COMMUNITY)
Admission: EM | Admit: 2013-07-09 | Discharge: 2013-07-09 | Disposition: A | Discharge status: Home / Self Care | Payer: Worker's Compensation | Attending: Emergency Medicine | Admitting: Emergency Medicine

## 2013-07-09 DIAGNOSIS — Z87448 Personal history of other diseases of urinary system: Secondary | ICD-10-CM | POA: Insufficient documentation

## 2013-07-09 DIAGNOSIS — S40011A Contusion of right shoulder, initial encounter: Secondary | ICD-10-CM

## 2013-07-09 DIAGNOSIS — I10 Essential (primary) hypertension: Secondary | ICD-10-CM | POA: Insufficient documentation

## 2013-07-09 DIAGNOSIS — F411 Generalized anxiety disorder: Secondary | ICD-10-CM | POA: Insufficient documentation

## 2013-07-09 DIAGNOSIS — S0990XA Unspecified injury of head, initial encounter: Secondary | ICD-10-CM

## 2013-07-09 DIAGNOSIS — IMO0001 Reserved for inherently not codable concepts without codable children: Secondary | ICD-10-CM | POA: Insufficient documentation

## 2013-07-09 DIAGNOSIS — K219 Gastro-esophageal reflux disease without esophagitis: Secondary | ICD-10-CM | POA: Insufficient documentation

## 2013-07-09 DIAGNOSIS — J45909 Unspecified asthma, uncomplicated: Secondary | ICD-10-CM | POA: Insufficient documentation

## 2013-07-09 DIAGNOSIS — S6990XA Unspecified injury of unspecified wrist, hand and finger(s), initial encounter: Secondary | ICD-10-CM | POA: Insufficient documentation

## 2013-07-09 DIAGNOSIS — S59919A Unspecified injury of unspecified forearm, initial encounter: Secondary | ICD-10-CM

## 2013-07-09 DIAGNOSIS — Z8673 Personal history of transient ischemic attack (TIA), and cerebral infarction without residual deficits: Secondary | ICD-10-CM | POA: Insufficient documentation

## 2013-07-09 DIAGNOSIS — IMO0002 Reserved for concepts with insufficient information to code with codable children: Secondary | ICD-10-CM | POA: Insufficient documentation

## 2013-07-09 DIAGNOSIS — M129 Arthropathy, unspecified: Secondary | ICD-10-CM | POA: Insufficient documentation

## 2013-07-09 DIAGNOSIS — Z79899 Other long term (current) drug therapy: Secondary | ICD-10-CM | POA: Insufficient documentation

## 2013-07-09 DIAGNOSIS — S40019A Contusion of unspecified shoulder, initial encounter: Secondary | ICD-10-CM | POA: Insufficient documentation

## 2013-07-09 DIAGNOSIS — G8929 Other chronic pain: Secondary | ICD-10-CM | POA: Insufficient documentation

## 2013-07-09 DIAGNOSIS — R011 Cardiac murmur, unspecified: Secondary | ICD-10-CM | POA: Insufficient documentation

## 2013-07-09 DIAGNOSIS — Z91040 Latex allergy status: Secondary | ICD-10-CM | POA: Insufficient documentation

## 2013-07-09 DIAGNOSIS — S59909A Unspecified injury of unspecified elbow, initial encounter: Secondary | ICD-10-CM | POA: Insufficient documentation

## 2013-07-09 DIAGNOSIS — K589 Irritable bowel syndrome without diarrhea: Secondary | ICD-10-CM | POA: Insufficient documentation

## 2013-07-09 DIAGNOSIS — E785 Hyperlipidemia, unspecified: Secondary | ICD-10-CM | POA: Insufficient documentation

## 2013-07-09 DIAGNOSIS — M542 Cervicalgia: Secondary | ICD-10-CM | POA: Insufficient documentation
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
## 2013-07-09 NOTE — ED Provider Notes (Signed)
Medical screening examination/treatment/procedure(s) were performed by non-physician practitioner and as supervising physician I was immediately available for consultation/collaboration.   EKG Interpretation None      Rolland Porter, MD, Abram Sander   Janice Norrie, MD 07/09/13 [phone]

## 2013-07-09 NOTE — ED Notes (Signed)
[employer] and Dr Janice Norrie from Urology in the patients room at this time.
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
## 2013-10-14 ENCOUNTER — Other Ambulatory Visit (HOSPITAL_COMMUNITY): Payer: Self-pay

## 2013-10-14 DIAGNOSIS — R0600 Dyspnea, unspecified: Secondary | ICD-10-CM

## 2013-10-14 DIAGNOSIS — R0609 Other forms of dyspnea: Principal | ICD-10-CM

## 2013-10-20 ENCOUNTER — Ambulatory Visit (HOSPITAL_COMMUNITY)
Admission: RE | Admit: 2013-10-20 | Discharge: 2013-10-20 | Disposition: A | Discharge status: Home / Self Care | Payer: PRIVATE HEALTH INSURANCE | Source: Ambulatory Visit | Attending: Internal Medicine | Admitting: Internal Medicine

## 2013-10-20 DIAGNOSIS — R0609 Other forms of dyspnea: Secondary | ICD-10-CM | POA: Insufficient documentation

## 2013-10-20 DIAGNOSIS — R0989 Other specified symptoms and signs involving the circulatory and respiratory systems: Secondary | ICD-10-CM | POA: Insufficient documentation

## 2013-10-20 MED ORDER — ALBUTEROL SULFATE (2.5 MG/3ML) 0.083% IN NEBU
2.5000 mg | INHALATION_SOLUTION | Freq: Once | RESPIRATORY_TRACT | Status: AC
Start: 2013-10-20 — End: 2013-10-20
  Administered 2013-10-20: 2.5 mg via RESPIRATORY_TRACT

## 2013-10-26 LAB — PULMONARY FUNCTION TEST
DL/VA % pred: 108 %
DL/VA: 4.62 ml/min/mmHg/L
DLCO cor % pred: 92 %
DLCO cor: 17.48 ml/min/mmHg
DLCO unc % pred: 92 %
DLCO unc: 17.48 ml/min/mmHg
FEF 25-75 Post: 2.58 L/sec
FEF 25-75 Pre: 2.14 L/sec
FEF2575-%Change-Post: 20 %
FEF2575-%Pred-Post: 125 %
FEF2575-%Pred-Pre: 104 %
FEV1-%Change-Post: 1 %
FEV1-%Pred-Post: 94 %
FEV1-%Pred-Pre: 92 %
FEV1-Post: 2.02 L
FEV1-Pre: 1.98 L
FEV1FVC-%Change-Post: 7 %
FEV1FVC-%Pred-Pre: 105 %
FEV6-%Change-Post: -4 %
FEV6-%Pred-Post: 85 %
FEV6-%Pred-Pre: 89 %
FEV6-Post: 2.28 L
FEV6-Pre: 2.38 L
FEV6FVC-%Change-Post: 1 %
FEV6FVC-%Pred-Post: 104 %
FEV6FVC-%Pred-Pre: 102 %
FVC-%Change-Post: -5 %
FVC-%Pred-Post: 82 %
FVC-%Pred-Pre: 86 %
FVC-Post: 2.3 L
FVC-Pre: 2.42 L
Post FEV1/FVC ratio: 88 %
Post FEV6/FVC ratio: 100 %
Pre FEV1/FVC ratio: 82 %
Pre FEV6/FVC Ratio: 99 %
RV % pred: 70 %
RV: 1.28 L
TLC % pred: 83 %
TLC: 3.73 L

## 2013-11-11 ENCOUNTER — Other Ambulatory Visit: Payer: Self-pay | Admitting: Urology

## 2013-12-10 ENCOUNTER — Encounter (HOSPITAL_BASED_OUTPATIENT_CLINIC_OR_DEPARTMENT_OTHER): Payer: Self-pay | Admitting: *Deleted

## 2013-12-10 NOTE — Progress Notes (Signed)
NPO AFTER MN. ARRIVE AT 0600. NEEDS ISTAT AND EKG. WILL TAKE AM MEDS W/ SIPS OF WATER.  

## 2013-12-16 NOTE — H&P (Signed)
Active Problems Problems  1. Chronic interstitial cystitis without hematuria (595.1) 2. 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 Female stress incontinence (625.6) 4. Rash (782.1) 5. Urethral stricture (598.9)  History of Present Illness Brenda [name] reports worsening bladder pain from her IC. She has been receiving clorpactin instillations but her symptom relief has waned and she would like to proceed with another HOD with Urethral dilation and clorpactin.   Past Medical History Problems  1. History of Abrasion of hand (914.0) 2. History of Anxiety (300.00) 3. History of Asthma (493.90) 4. History of Endometriosis (617.9) 5. History of Fibromyalgia (729.1) 6. History of acute bronchitis (V12.69) 7. History of fibrocystic disease of breast (V13.89) 8. History of hypercholesterolemia (V12.29) 9. History of hypertension (V12.59) 10. History of migraine headaches (V12.49) 11. History of Irritable bowel syndrome with diarrhea (564.1) 12. History of Pancreatitis (577.9) 13. History of Serum Enzyme Levels - ALT (SGPT) Elevated (790.4) 14. History of Serum Enzyme Levels - AST (SGOT) Elevated (790.4) 15. History of Spasm Of Sphincter Of Oddi (576.5) 16. History of Stroke syndrome (434.91) 17. History of Upper respiratory infection with cough and congestion (465.9) 18. History of Vitamin D deficiency (268.9)  Surgical History Problems  1. History of Appendectomy 2. History of Bladder Irrigation 3. History of Bladder Irrigation 4. History of Bladder Irrigation 5. History of Bladder Irrigation 6. History of Breast Surgery Mastopexy Bilateral 7. History of Breast Surgery Reduction Procedure 8. History of Cholecystectomy 9. History of Complete Colonoscopy 10. History of Corneal LASIK Bilateral 11. History of Cystoscopy For Urethral Stricture 12. History of Cystoscopy With Biopsy 13. History of Cystoscopy With Dilation Of Bladder 14. History of Cystoscopy With Dilation Of Bladder 15. History of  Cystoscopy With Dilation Of Bladder 16. History of Cystoscopy With Dilation Of Bladder 17. History of Cystoscopy With Dilation Of Bladder 18. History of Dilation And Curettage 19. History of Endoscopic Retrograde Cholangiopancreatography (ERCP) 20. History of Hemorrhoidectomy 21. History of Hysterectomy 22. History of Liver Biopsy 23. History of Nasal Septal Deviation Repair 24. History of Nose Surgery 25. History of Oophorectomy 26. History of Tonsillectomy With Adenoidectomy  Current Meds 1. Aspirin EC Lo-Dose 81 MG Oral Tablet Delayed Release;  Therapy: (Recorded:29May2015) to Recorded 2. Biotin TABS;  Therapy: (Recorded:29May2015) to Recorded 3. Calcium + D TABS;  Therapy: (Recorded:26Aug2010) to Recorded 4. FLUoxetine HCl - 10 MG Oral Tablet;  Therapy: (Recorded:16Jul2015) to Recorded 5. Folic Acid 1 MG Oral Tablet;  Therapy: (Recorded:30Aug2013) to Recorded 6. LORazepam 1 MG Oral Tablet;  Therapy: (Recorded:26Aug2010) to Recorded 7. Meperidine HCl 50 MG/ML Injection Solution; USE AS DIRECTED; To Be Done:  [id number]; Status: HOLD FOR - Administration Ordered 8. Meperidine HCl 50 MG/ML Injection Solution; USE AS DIRECTED; To Be Done:  [id number]; Status: HOLD FOR - Administration Ordered 9. Metoprolol Tartrate 50 MG Oral Tablet;  Therapy: (Recorded:26Aug2010) to Recorded 10. Minivelle 0.1 MG/24HR Transdermal Patch Twice Weekly;   Therapy: (Recorded:27Dec2013) to Recorded 11. Red Yeast Rice CAPS;   Therapy: (Recorded:16Jul2015) to Recorded 12. Vitamin B Complex TABS;   Therapy: (Recorded:29May2015) to Recorded 13. Vitamin D (Ergocalciferol) 50000 UNIT Oral Capsule;   Therapy: (Recorded:30Jan2012) to Recorded 14. Zetia 10 MG Oral Tablet;   Therapy: (Recorded:26Aug2010) to Recorded  Allergies Medication  1. Codeine Derivatives 2. Dilaudid TABS 3. Lisinopril TABS 4. Marcaine SOLN 5. Morphine Derivatives 6. Stadol SOLN  Family History Problems  1. Family history of  Acute Myocardial Infarction (V17.3) : Mother 2. Family history of Bladder Cancer (P82.42) : Father 3. Family history of Congestive Heart  Failure : Mother 4. Family history of Coronary Artery Disease : Mother 5. Family history of Death In The Family Father   age 22 of brain tumor 44. Family history of Death In The Family Mother   age 24 of respiratory failure and pneumonia 7. Family history of Diabetes Mellitus (V18.0) : Mother 8. Family history of Diabetes Mellitus (V18.0) : Sister 30. Family history of Family Health Status Number Of Children   No children 10. Family history of Fibromyalgia : Mother 8. Family history of Gastric Ulcer : Mother 68. Family history of Glioblastoma Multiforme (Grade IV) Of The Brain : Father 27. Family history of Heart Disease (V17.49) : Mother 33. Family history of Hypercholesterolemia : Father 90. Family history of Malignant Melanoma Of The Skin (V16.8) : Sister 98. Family history of Skin Cancer (V16.8) : Mother 9. Family history of Skin Cancer (V16.8) : Father 89. Family history of Stroke Syndrome (V17.1) : Mother  Social History Problems  1. Denied: History of Alcohol Use 2. Caffeine Use   2-3 a day 3. Marital History - Currently Married 4. Never A Smoker 5. 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 Denied: History of Tobacco Use  Review of Systems Genitourinary, constitutional, skin, eye, otolaryngeal, hematologic/lymphatic, cardiovascular, pulmonary, endocrine, musculoskeletal, gastrointestinal, neurological and psychiatric system(s) were reviewed and pertinent findings if present are noted.  Genitourinary: suprapubic pain.    Physical Exam Constitutional: Well nourished and well developed . No acute distress.  Pulmonary: No respiratory distress and normal respiratory rhythm and effort.  Cardiovascular: Heart rate and rhythm are normal . No peripheral edema.    Results/Data  The following clinical lab  reports were reviewed:  A UA is pending.    Assessment Assessed  1. Chronic interstitial cystitis without hematuria (595.1)  She has progressive symptoms of IC.   Plan I am going to have her set up for a Cystoscopy with HOD and urethral dilation with instillation of clorpactin.  She is aware of the risks.

## 2013-12-16 NOTE — Anesthesia Preprocedure Evaluation (Addendum)
Anesthesia Evaluation  Patient identified by MRN, date of birth, ID band Patient awake    Reviewed: Allergy & Precautions, H&P , NPO status , Patient's Chart, lab work & pertinent test results  History of Anesthesia Complications (+) PONV and history of anesthetic complications  Airway Mallampati: II TM Distance: >3 FB Neck ROM: Full    Dental  (+) Teeth Intact, Dental Advisory Given   Pulmonary asthma ,  breath sounds clear to auscultation        Cardiovascular METS: 3 - Mets hypertension, + DOE Rhythm:Regular Rate:Normal     Neuro/Psych  Headaches, Anxiety TIA   GI/Hepatic negative GI ROS, Neg liver ROS, GERD-  Medicated and Controlled,  Endo/Other  negative endocrine ROS  Renal/GU negative Renal ROS     Musculoskeletal  (+) Fibromyalgia -  Abdominal   Peds  Hematology negative hematology ROS (+)   Anesthesia Other Findings   Reproductive/Obstetrics negative OB ROS                         Anesthesia Physical Anesthesia Plan  ASA: II  Anesthesia Plan: MAC   Post-op Pain Management:    Induction: Intravenous  Airway Management Planned: Simple Face Mask  Additional Equipment: None  Intra-op Plan:   Post-operative Plan: Extubation in OR  Informed Consent: I have reviewed the patients History and Physical, chart, labs and discussed the procedure including the risks, benefits and alternatives for the proposed anesthesia with the patient or authorized representative who has indicated his/her understanding and acceptance.   Dental advisory given  Plan Discussed with: CRNA  Anesthesia Plan Comments:        Anesthesia Quick Evaluation

## 2013-12-17 ENCOUNTER — Encounter (HOSPITAL_BASED_OUTPATIENT_CLINIC_OR_DEPARTMENT_OTHER): Payer: Self-pay

## 2013-12-17 ENCOUNTER — Ambulatory Visit (HOSPITAL_BASED_OUTPATIENT_CLINIC_OR_DEPARTMENT_OTHER): Payer: PRIVATE HEALTH INSURANCE | Admitting: Anesthesiology

## 2013-12-17 ENCOUNTER — Ambulatory Visit (HOSPITAL_BASED_OUTPATIENT_CLINIC_OR_DEPARTMENT_OTHER)
Admission: RE | Admit: 2013-12-17 | Discharge: 2013-12-17 | Disposition: A | Discharge status: Home / Self Care | Payer: PRIVATE HEALTH INSURANCE | Source: Ambulatory Visit | Attending: Urology | Admitting: Urology

## 2013-12-17 ENCOUNTER — Encounter (HOSPITAL_BASED_OUTPATIENT_CLINIC_OR_DEPARTMENT_OTHER)
Admission: RE | Disposition: A | Discharge status: Home / Self Care | Payer: Self-pay | Source: Ambulatory Visit | Attending: Urology

## 2013-12-17 ENCOUNTER — Encounter (HOSPITAL_BASED_OUTPATIENT_CLINIC_OR_DEPARTMENT_OTHER): Payer: PRIVATE HEALTH INSURANCE | Admitting: Anesthesiology

## 2013-12-17 DIAGNOSIS — Z888 Allergy status to other drugs, medicaments and biological substances status: Secondary | ICD-10-CM | POA: Insufficient documentation

## 2013-12-17 DIAGNOSIS — E559 Vitamin D deficiency, unspecified: Secondary | ICD-10-CM | POA: Diagnosis not present

## 2013-12-17 DIAGNOSIS — I10 Essential (primary) hypertension: Secondary | ICD-10-CM | POA: Insufficient documentation

## 2013-12-17 DIAGNOSIS — N3010 Interstitial cystitis (chronic) without hematuria: Secondary | ICD-10-CM

## 2013-12-17 DIAGNOSIS — Z8673 Personal history of transient ischemic attack (TIA), and cerebral infarction without residual deficits: Secondary | ICD-10-CM | POA: Diagnosis not present

## 2013-12-17 DIAGNOSIS — Z8052 Family history of malignant neoplasm of bladder: Secondary | ICD-10-CM | POA: Diagnosis not present

## 2013-12-17 DIAGNOSIS — F411 Generalized anxiety disorder: Secondary | ICD-10-CM | POA: Diagnosis not present

## 2013-12-17 DIAGNOSIS — IMO0001 Reserved for inherently not codable concepts without codable children: Secondary | ICD-10-CM | POA: Insufficient documentation

## 2013-12-17 DIAGNOSIS — Z885 Allergy status to narcotic agent status: Secondary | ICD-10-CM | POA: Diagnosis not present

## 2013-12-17 DIAGNOSIS — R21 Rash and other nonspecific skin eruption: Secondary | ICD-10-CM | POA: Insufficient documentation

## 2013-12-17 DIAGNOSIS — N393 Stress incontinence (female) (male): Secondary | ICD-10-CM | POA: Insufficient documentation

## 2013-12-17 DIAGNOSIS — J45909 Unspecified asthma, uncomplicated: Secondary | ICD-10-CM | POA: Diagnosis not present

## 2013-12-17 DIAGNOSIS — N8111 Cystocele, midline: Secondary | ICD-10-CM | POA: Diagnosis not present

## 2013-12-17 DIAGNOSIS — K219 Gastro-esophageal reflux disease without esophagitis: Secondary | ICD-10-CM | POA: Insufficient documentation

## 2013-12-17 DIAGNOSIS — E785 Hyperlipidemia, unspecified: Secondary | ICD-10-CM | POA: Insufficient documentation

## 2013-12-17 DIAGNOSIS — Z79899 Other long term (current) drug therapy: Secondary | ICD-10-CM | POA: Insufficient documentation

## 2013-12-17 HISTORY — PX: CYSTO WITH HYDRODISTENSION: SHX5453

## 2013-12-17 LAB — POCT I-STAT 4, (NA,K, GLUC, HGB,HCT)
Glucose, Bld: 98 mg/dL (ref 70–99)
HCT: 41 % (ref 36.0–46.0)
Hemoglobin: 13.9 g/dL (ref 12.0–15.0)
Potassium: 3.7 mEq/L (ref 3.7–5.3)
Sodium: 140 mEq/L (ref 137–147)

## 2013-12-17 SURGERY — CYSTOSCOPY, WITH BLADDER HYDRODISTENSION
Anesthesia: Monitor Anesthesia Care | Site: Bladder

## 2013-12-17 MED ORDER — FENTANYL CITRATE 0.05 MG/ML IJ SOLN
25.0000 ug | INTRAMUSCULAR | Status: DC | PRN
  Filled 2013-12-17: qty 1

## 2013-12-17 MED ORDER — ONDANSETRON HCL 4 MG/2ML IJ SOLN
INTRAMUSCULAR | Status: DC | PRN
  Administered 2013-12-17: 4 mg via INTRAVENOUS

## 2013-12-17 MED ORDER — FENTANYL CITRATE 0.05 MG/ML IJ SOLN
INTRAMUSCULAR | Status: DC | PRN
  Administered 2013-12-17 (×2): 50 ug via INTRAVENOUS

## 2013-12-17 MED ORDER — MEPERIDINE HCL 25 MG/ML IJ SOLN
INTRAMUSCULAR | Status: AC
  Filled 2013-12-17: qty 1

## 2013-12-17 MED ORDER — CIPROFLOXACIN IN D5W 400 MG/200ML IV SOLN
400.0000 mg | INTRAVENOUS | Status: AC
  Administered 2013-12-17: 400 mg via INTRAVENOUS
  Filled 2013-12-17: qty 200

## 2013-12-17 MED ORDER — SODIUM CHLORIDE 0.9 % IJ SOLN
3.0000 mL | Freq: Two times a day (BID) | INTRAMUSCULAR | Status: DC
  Filled 2013-12-17: qty 3

## 2013-12-17 MED ORDER — MIDAZOLAM HCL 2 MG/2ML IJ SOLN
INTRAMUSCULAR | Status: AC
  Filled 2013-12-17: qty 2

## 2013-12-17 MED ORDER — CIPROFLOXACIN IN D5W 400 MG/200ML IV SOLN
INTRAVENOUS | Status: AC
  Filled 2013-12-17: qty 200

## 2013-12-17 MED ORDER — OXYCHLOROSENE SODIUM POWD
Status: DC | PRN
  Administered 2013-12-17: 1

## 2013-12-17 MED ORDER — SODIUM CHLORIDE 0.9 % IJ SOLN
3.0000 mL | INTRAMUSCULAR | Status: DC | PRN
  Filled 2013-12-17: qty 3

## 2013-12-17 MED ORDER — PROMETHAZINE HCL 25 MG/ML IJ SOLN
6.2500 mg | INTRAMUSCULAR | Status: DC | PRN
  Filled 2013-12-17: qty 1

## 2013-12-17 MED ORDER — PROMETHAZINE HCL 25 MG/ML IJ SOLN
INTRAMUSCULAR | Status: AC
  Filled 2013-12-17: qty 1

## 2013-12-17 MED ORDER — ACETAMINOPHEN 325 MG PO TABS
650.0000 mg | ORAL_TABLET | ORAL | Status: DC | PRN
  Filled 2013-12-17: qty 2

## 2013-12-17 MED ORDER — DEXAMETHASONE SODIUM PHOSPHATE 4 MG/ML IJ SOLN
INTRAMUSCULAR | Status: DC | PRN
  Administered 2013-12-17: 10 mg via INTRAVENOUS

## 2013-12-17 MED ORDER — ACETAMINOPHEN 650 MG RE SUPP
650.0000 mg | RECTAL | Status: DC | PRN
  Filled 2013-12-17: qty 1

## 2013-12-17 MED ORDER — PROPOFOL 10 MG/ML IV BOLUS
INTRAVENOUS | Status: DC | PRN
  Administered 2013-12-17: 40 mg via INTRAVENOUS

## 2013-12-17 MED ORDER — MIDAZOLAM HCL 5 MG/5ML IJ SOLN
INTRAMUSCULAR | Status: DC | PRN
  Administered 2013-12-17: 2 mg via INTRAVENOUS

## 2013-12-17 MED ORDER — MEPERIDINE HCL 50 MG/ML IJ SOLN
50.0000 mg | Freq: Once | INTRAMUSCULAR | Status: AC
  Administered 2013-12-17: 50 mg via INTRAMUSCULAR
  Filled 2013-12-17 (×2): qty 1

## 2013-12-17 MED ORDER — SODIUM CHLORIDE 0.9 % IV SOLN
250.0000 mL | INTRAVENOUS | Status: DC | PRN
  Filled 2013-12-17: qty 250

## 2013-12-17 MED ORDER — FENTANYL CITRATE 0.05 MG/ML IJ SOLN
INTRAMUSCULAR | Status: AC
  Filled 2013-12-17: qty 4

## 2013-12-17 MED ORDER — SODIUM CHLORIDE 0.9 % IV SOLN
INTRAVENOUS | Status: DC
  Administered 2013-12-17: 07:00:00 via INTRAVENOUS
  Filled 2013-12-17: qty 1000

## 2013-12-17 MED ORDER — STERILE WATER FOR IRRIGATION IR SOLN
Status: DC | PRN
  Administered 2013-12-17: 3000 mL
  Administered 2013-12-17: 500 mL

## 2013-12-17 MED ORDER — PROPOFOL 10 MG/ML IV EMUL
INTRAVENOUS | Status: DC | PRN
  Administered 2013-12-17: 200 ug/kg/min via INTRAVENOUS

## 2013-12-17 MED ORDER — MEPERIDINE HCL 50 MG/ML IJ SOLN: 75.0000 mg | Freq: Once | INTRAMUSCULAR | Status: DC

## 2013-12-17 MED ORDER — MEPERIDINE HCL 25 MG/ML IJ SOLN
25.0000 mg | Freq: Once | INTRAMUSCULAR | Status: AC
  Administered 2013-12-17: 25 mg via INTRAMUSCULAR
  Filled 2013-12-17: qty 1

## 2013-12-17 MED ORDER — PROMETHAZINE HCL 25 MG/ML IJ SOLN
25.0000 mg | Freq: Four times a day (QID) | INTRAMUSCULAR | Status: AC | PRN
  Administered 2013-12-17: 25 mg via INTRAMUSCULAR
  Filled 2013-12-17: qty 1

## 2013-12-17 MED ORDER — PROMETHAZINE HCL 25 MG/ML IJ SOLN: 25.0000 mg | Freq: Once | INTRAMUSCULAR | Status: DC

## 2013-12-17 MED ORDER — LIDOCAINE HCL (CARDIAC) 20 MG/ML IV SOLN
INTRAVENOUS | Status: DC | PRN
  Administered 2013-12-17: 50 mg via INTRAVENOUS

## 2013-12-17 SURGICAL SUPPLY — 18 items
BAG DRAIN URO-CYSTO SKYTR STRL (DRAIN) ×2 IMPLANT
BAG DRN UROCATH (DRAIN) ×1
CANISTER SUCT LVC 12 LTR MEDI- (MISCELLANEOUS) ×1 IMPLANT
CATH SILICONE 16FRX5CC (CATHETERS) ×1 IMPLANT
CLOTH BEACON ORANGE TIMEOUT ST (SAFETY) ×2 IMPLANT
DRAPE CAMERA CLOSED 9X96 (DRAPES) ×2 IMPLANT
GLOVE BIOGEL PI IND STRL 7.5 (GLOVE) IMPLANT
GLOVE BIOGEL PI INDICATOR 7.5 (GLOVE) ×1
GLOVE INDICATOR 7.5 STRL GRN (GLOVE) ×1 IMPLANT
GLOVE SURG SS PI 8.0 STRL IVOR (GLOVE) ×2 IMPLANT
GOWN STRL REUS W/ TWL XL LVL3 (GOWN DISPOSABLE) IMPLANT
GOWN STRL REUS W/TWL XL LVL3 (GOWN DISPOSABLE) ×3 IMPLANT
NDL SAFETY ECLIPSE 18X1.5 (NEEDLE) ×1 IMPLANT
NEEDLE HYPO 18GX1.5 SHARP (NEEDLE) ×2
PACK CYSTOSCOPY (CUSTOM PROCEDURE TRAY) ×2 IMPLANT
PLUG CATH AND CAP STER (CATHETERS) ×1 IMPLANT
SYR 30ML LL (SYRINGE) ×2 IMPLANT
WATER STERILE IRR 3000ML UROMA (IV SOLUTION) ×2 IMPLANT

## 2013-12-17 NOTE — Discharge Instructions (Signed)
Cystoscopy, Care After Refer to this sheet in the next few weeks. These instructions provide you with information on caring for yourself after your procedure. Your caregiver may also give you more specific instructions. Your treatment has been planned according to current medical practices, but problems sometimes occur. Call your caregiver if you have any problems or questions after your procedure. HOME CARE INSTRUCTIONS  Things you can do to ease any discomfort after your procedure include:  Drinking enough water and fluids to keep your urine clear or pale yellow.  Taking a warm bath to relieve any burning feelings. SEEK IMMEDIATE MEDICAL CARE IF:   You have an increase in blood in your urine.  You notice blood clots in your urine.  You have difficulty passing urine.  You have the chills.  You have abdominal pain.  You have a fever or persistent symptoms for more than 2-3 days.  You have a fever and your symptoms suddenly get worse. MAKE SURE YOU:   Understand these instructions.  Will watch your condition.  Will get help right away if you are not doing well or get worse. Document Released: 11/03/2004 Document Revised: 12/17/2012 Document Reviewed: 10/08/2011 [facility] Patient Information 2015 [place], [place]. This information is not intended to replace advice given to you by your health care provider. Make sure you discuss any questions you have with your health care provider.    Post Anesthesia Home Care Instructions  Activity: Get plenty of rest for the remainder of the day. A responsible adult should stay with you for 24 hours following the procedure.  For the next 24 hours, DO NOT: -Drive a car -Paediatric nurse -Drink alcoholic beverages -Take any medication unless instructed by your physician -Make any legal decisions or sign important papers.  Meals: Start with liquid foods such as gelatin or soup. Progress to regular foods as tolerated. Avoid greasy, spicy,  heavy foods. If nausea and/or vomiting occur, drink only clear liquids until the nausea and/or vomiting subsides. Call your physician if vomiting continues.  Special Instructions/Symptoms: Your throat may feel dry or sore from the anesthesia or the breathing tube placed in your throat during surgery. If this causes discomfort, gargle with warm salt water. The discomfort should disappear within 24 hours.  Post Anesthesia Home Care Instructions  Activity: Get plenty of rest for the remainder of the day. A responsible adult should stay with you for 24 hours following the procedure.  For the next 24 hours, DO NOT: -Drive a car -Paediatric nurse -Drink alcoholic beverages -Take any medication unless instructed by your physician -Make any legal decisions or sign important papers.  Meals: Start with liquid foods such as gelatin or soup. Progress to regular foods as tolerated. Avoid greasy, spicy, heavy foods. If nausea and/or vomiting occur, drink only clear liquids until the nausea and/or vomiting subsides. Call your physician if vomiting continues.  Special Instructions/Symptoms: Your throat may feel dry or sore from the anesthesia or the breathing tube placed in your throat during surgery. If this causes discomfort, gargle with warm salt water. The discomfort should disappear within 24 hours.

## 2013-12-17 NOTE — Transfer of Care (Signed)
Immediate Anesthesia Transfer of Care Note  Patient: Brenda [name]  Procedure(s) Performed: Procedure(s): CYSTO HYDRODISTENSION WITH INSTALLATION OF CHLOROPACTIN AND TETRACAINE (N/A)  Patient Location: PACU  Anesthesia Type:MAC  Level of Consciousness: awake, alert , oriented and patient cooperative  Airway & Oxygen Therapy: Patient Spontanous Breathing and Patient connected to nasal cannula oxygen  Post-op Assessment: Report given to PACU RN and Post -op Vital signs reviewed and stable  Post vital signs: Reviewed and stable  Complications: No apparent anesthesia complications

## 2013-12-17 NOTE — Brief Op Note (Signed)
12/17/2013  7:50 AM  PATIENT:  Brenda [name]  63 y.o. female  PRE-OPERATIVE DIAGNOSIS:  INTERSTITIAL CYSTITIS   POST-OPERATIVE DIAGNOSIS:  INTERSTITIAL CYSTITIS   PROCEDURE:  Procedure(s): CYSTO HYDRODISTENSION WITH INSTALLATION OF CHLOROPACTIN AND TETRACAINE (N/A)  SURGEON:  Surgeon(s) and Role:    * Malka So, MD - Primary  PHYSICIAN ASSISTANT:   ASSISTANTS: none   ANESTHESIA:   MAC  EBL:     BLOOD ADMINISTERED:none  DRAINS: Urinary Catheter (Foley)   LOCAL MEDICATIONS USED:  OTHER Tetracaine 0.5% 27ml  SPECIMEN:  No Specimen  DISPOSITION OF SPECIMEN:  N/A  COUNTS:  YES  TOURNIQUET:  * No tourniquets in log *  DICTATION: .Other Dictation: Dictation Number [id number]  PLAN OF CARE: Discharge to home after PACU  PATIENT DISPOSITION:  PACU - hemodynamically stable.   Delay start of Pharmacological VTE agent (>24hrs) due to surgical blood loss or risk of bleeding: not applicable

## 2013-12-17 NOTE — Anesthesia Postprocedure Evaluation (Signed)
  Anesthesia Post-op Note  Patient: Brenda [name]  Procedure(s) Performed: Procedure(s): CYSTO HYDRODISTENSION WITH INSTALLATION OF CHLOROPACTIN AND TETRACAINE (N/A)  Patient Location: PACU  Anesthesia Type:MAC  Level of Consciousness: awake, alert  and oriented  Airway and Oxygen Therapy: Patient Spontanous Breathing  Post-op Pain: mild  Post-op Assessment: Post-op Vital signs reviewed  Post-op Vital Signs: Reviewed  Last Vitals:  Filed Vitals:   12/17/13 0845  BP: 123/68  Pulse: 58  Temp:   Resp: 17    Complications: No apparent anesthesia complications

## 2013-12-17 NOTE — Anesthesia Procedure Notes (Signed)
Procedure Name: MAC Date/Time: 12/17/2013 7:34 AM Performed by: Wanita Chamberlain Pre-anesthesia Checklist: Patient identified, Timeout performed, Emergency Drugs available, Suction available and Patient being monitored Patient Re-evaluated:Patient Re-evaluated prior to inductionOxygen Delivery Method: Nasal cannula Intubation Type: IV induction Placement Confirmation: positive ETCO2

## 2013-12-17 NOTE — Interval H&P Note (Signed)
History and Physical Interval Note:  12/17/2013 7:18 AM  Brenda [name]  has presented today for surgery, with the diagnosis of INTERSTITIAL CYSTITIS   The various methods of treatment have been discussed with the patient and family. After consideration of risks, benefits and other options for treatment, the patient has consented to  Procedure(s): CYSTO HYDRODISTENSION WITH INSTALLATION OF CHLOROBACTENE/URETERAL DILATION (N/A) as a surgical intervention .  The patient's history has been reviewed, patient examined, no change in status, stable for surgery.  I have reviewed the patient's chart and labs.  Questions were answered to the patient's satisfaction.     [name]

## 2013-12-17 NOTE — OR Nursing (Signed)
TETRACAINE HCL 1:500/ 0.1% NEOMYCIN 60ML INSTILLED INTO BLADDER AT THE END OF PROCEDURE AND CATHETER PLUGGED. PT RECEIVED THE BOTTLE WITH REMAINING MEDICATION IN PACU TO TAKE HOME.

## 2013-12-18 ENCOUNTER — Encounter (HOSPITAL_BASED_OUTPATIENT_CLINIC_OR_DEPARTMENT_OTHER): Payer: Self-pay | Admitting: Urology

## 2013-12-21 NOTE — Op Note (Signed)
Brenda [name], Brenda [name]               ACCOUNT NO.:  [id number]  MEDICAL RECORD NO.:  [id number]  LOCATION:                                 FACILITY:  PHYSICIAN:  Marshall Cork. Jeffie Pollock, M.D.    DATE OF BIRTH:  [birth year]  DATE OF PROCEDURE:  12/17/2013 DATE OF DISCHARGE:  12/17/2013                              OPERATIVE REPORT   PROCEDURE:  Cystoscopy, hydrodistention of bladder, instillation of Clorpactin and tetracaine.  PREOPERATIVE DIAGNOSIS:  Interstitial cystitis.  POSTOPERATIVE DIAGNOSIS:  Interstitial cystitis.  SURGEON:  Marshall Cork. Jeffie Pollock, MD  ANESTHESIA:  MAC.  SPECIMEN:  None.  DRAIN:  Foley.  COMPLICATIONS:  None.  INDICATIONS:  [name] is a 63 year old white female with history of interstitial cystitis who presents today for hydrodistention for progressive symptoms.  FINDINGS OF PROCEDURE:  She was taken to the operating room.  She was given Cipro.  Sedation was induced.  She was placed in lithotomy position and fitted with a PAS hose.  Perineum and genitalia were prepped with Betadine solution.  She was draped in usual sterile fashion.  On exam, she has a small rectocele.  No significant cystocele.  She underwent cystoscopy with a 22-French scope and 12-degree lens. Examination revealed a normal urethra.  The bladder wall had mild trabeculation.  No tumors or stones were noted.  Ureteral orifices were unremarkable.  She then underwent hydrodistention under 80 cm of water pressure to capacity.  The bladder was drained.  She held 500 mL under anesthesia with bloody terminal efflux and reinspection after hydrodistention demonstrated glomerulations particularly in the trigone consistent with interstitial cystitis.  After completion of hydrodistention, a 34-JZPHXT silicone catheter was placed and the balloon was filled with 10 mL of sterile fluid.  The bladder was then filled with approximately 150 mL of dilute Clorpactin solution 1 amp in 500 mL.  This was left  indwelling for 2 minutes.  The bladder was drained in rest.  She was then instilled with 60 mL of tetracaine 0.5%, and the catheter was plugged.  She was then taken down from lithotomy position and moved to recovery room in stable condition.  There were no complications.     Marshall Cork. Jeffie Pollock, M.D.     JJW/MEDQ  D:  12/17/2013  T:  12/17/2013  Job:  [id number]

## 2014-01-08 ENCOUNTER — Ambulatory Visit (INDEPENDENT_AMBULATORY_CARE_PROVIDER_SITE_OTHER): Payer: Self-pay | Admitting: General Surgery

## 2014-04-26 ENCOUNTER — Other Ambulatory Visit: Payer: Self-pay

## 2014-04-26 DIAGNOSIS — Z1231 Encounter for screening mammogram for malignant neoplasm of breast: Secondary | ICD-10-CM

## 2014-06-07 ENCOUNTER — Ambulatory Visit
Admission: RE | Admit: 2014-06-07 | Discharge: 2014-06-07 | Disposition: A | Discharge status: Home / Self Care | Payer: PRIVATE HEALTH INSURANCE | Source: Ambulatory Visit

## 2014-06-07 DIAGNOSIS — Z1231 Encounter for screening mammogram for malignant neoplasm of breast: Secondary | ICD-10-CM

## 2015-03-22 ENCOUNTER — Other Ambulatory Visit: Payer: Self-pay | Admitting: Internal Medicine

## 2015-03-22 DIAGNOSIS — J209 Acute bronchitis, unspecified: Secondary | ICD-10-CM

## 2015-03-23 ENCOUNTER — Other Ambulatory Visit: Payer: Self-pay

## 2015-03-28 ENCOUNTER — Ambulatory Visit
Admission: RE | Admit: 2015-03-28 | Discharge: 2015-03-28 | Disposition: A | Discharge status: Home / Self Care | Payer: PRIVATE HEALTH INSURANCE | Source: Ambulatory Visit | Attending: Internal Medicine | Admitting: Internal Medicine

## 2015-03-28 DIAGNOSIS — J209 Acute bronchitis, unspecified: Secondary | ICD-10-CM

## 2015-05-13 ENCOUNTER — Other Ambulatory Visit: Payer: Self-pay

## 2015-05-13 DIAGNOSIS — Z9889 Other specified postprocedural states: Secondary | ICD-10-CM

## 2015-05-13 DIAGNOSIS — Z1231 Encounter for screening mammogram for malignant neoplasm of breast: Secondary | ICD-10-CM

## 2015-05-30 ENCOUNTER — Other Ambulatory Visit: Payer: Self-pay | Admitting: Urology

## 2015-06-01 ENCOUNTER — Encounter (HOSPITAL_BASED_OUTPATIENT_CLINIC_OR_DEPARTMENT_OTHER): Payer: Self-pay | Admitting: *Deleted

## 2015-06-01 NOTE — Progress Notes (Signed)
NPO AFTER MN. ARRIVE AT 0715. NEEDS ISTAT AND EKG. WILL TAKE AM MEDS W/ SIPS OF WATER. 

## 2015-06-01 NOTE — H&P (Signed)
Active Problems Problems   1. 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 Cystocele, midline (N81.11)  3. Female stress incontinence (N39.3)  4. Folliculitis (123XX123)  5. Rash (R21)  6. Urethral stricture (N35.9)  History of Present Illness   Brenda [name] reports worsening bladder pain from her IC.  She has been receiving clorpactin instillations but her symptom relief has waned and she would like to proceed with another HOD with Urethral dilation and clorpactin.   Past Medical History Problems   1. History of Abrasion of hand (S60.519A)  2. History of Anxiety (F41.9)  3. History of Asthma (J45.909)  4. History of Endometriosis (N80.9)  5. History of Fibromyalgia (M79.7)  6. History of acute bronchitis (Z87.09)  7. History of fibrocystic disease of breast (Z87.898)  8. History of hypercholesterolemia (Z86.39)  9. History of hypertension (Z86.79)  10. History of migraine headaches (Z86.69)  11. History of Irritable bowel syndrome with diarrhea (K58.0)  12. History of Pancreatitis  13. History of Serum Enzyme Levels - ALT (SGPT) Elevated  14. History of Serum Enzyme Levels - AST (SGOT) Elevated  15. History of Spasm Of Sphincter Of Oddi  16. History of Stroke syndrome (I63.9)  17. History of Upper respiratory infection with cough and congestion (J06.9)  18. History of Vitamin D deficiency (E55.9)  Surgical History Problems   1. History of Appendectomy  2. History of Biopsy Of Liver  3. History of Bladder Irrigation  4. History of Bladder Irrigation  5. History of Bladder Irrigation  6. History of Bladder Irrigation  7. History of Breast Surgery Mastopexy Bilateral  8. History of Breast Surgery Reduction Procedure  9. History of Cholecystectomy  10. History of Complete Colonoscopy  11. History of Corneal LASIK Bilateral  12. History of Cystoscopy For Urethral Stricture  13. History of Cystoscopy With Biopsy  14. History of Cystoscopy With Dilation Of  Bladder  15. History of Cystoscopy With Dilation Of Bladder  16. History of Cystoscopy With Dilation Of Bladder  17. History of Cystoscopy With Dilation Of Bladder  18. History of Cystoscopy With Dilation Of Bladder  19. History of Dilation And Curettage  20. History of Endoscopic Retrograde Cholangiopancreatography (ERCP)  21. History of Hemorrhoidectomy  22. History of Hysterectomy  23. History of Nasal Septal Deviation Repair  24. History of Nose Surgery  25. History of Oophorectomy  26. History of Tonsillectomy With Adenoidectomy  Current Meds  1. Aspirin EC Lo-Dose 81 MG TBEC;  Therapy: (Recorded:29May2015) to Recorded  2. Benicar TABS;  Therapy: (Recorded:22Sep2016) to Recorded  3. Biotin TABS;  Therapy: (Recorded:29May2015) to Recorded  4. Calcium + D TABS;  Therapy: (Recorded:26Aug2010) to Recorded  5. Folic Acid 1 MG Oral Tablet;  Therapy: (Recorded:30Aug2013) to Recorded  6. LORazepam 1 MG Oral Tablet;  Therapy: (Recorded:26Aug2010) to Recorded  7. Meperidine HCl 50 MG/ML Injection Solution; USE AS DIRECTED; To Be Done:  [id number]; Status: HOLD FOR - Administration Ordered  8. Meperidine HCl 50 MG/ML Injection Solution; USE AS DIRECTED; To Be Done:  [id number]; Status: HOLD FOR - Administration Ordered  9. Metoprolol Tartrate 50 MG Oral Tablet;  Therapy: (Recorded:26Aug2010) to Recorded  10. Min[place]elle 0.1 MG/24HR Transdermal Patch Twice Weekly;   Therapy: (Recorded:27Dec2013) to Recorded  11. Vitamin B Complex TABS;   Therapy: (Recorded:29May2015) to Recorded  12. Vitamin D (Ergocalciferol) 50000 UNIT Oral Capsule;   Therapy: (Recorded:30Jan2012) to Recorded  13. Zetia 10 MG Oral Tablet;   Therapy: (Recorded:26Aug2010) to Recorded  Allergies Medication   1.  Codeine Derivatives  2. Dilaudid TABS  3. Lisinopril TABS  4. Marcaine SOLN  5. Morphine Derivatives  6. Stadol SOLN  Family History Problems   1. Family history of Acute Myocardial Infarction :  Mother  2. Family history of Bladder Cancer : Father  3. Family history of Congestive Heart Failure : Mother  4. Family history of Coronary Artery Disease : Mother  5. Family history of Death In The Family Father   age 92 of brain tumor  63. Family history of Death In The Family Mother   age 14 of respiratory failure and pneumonia  7. Family history of Diabetes Mellitus : Mother  30. Family history of Diabetes Mellitus : Sister  71. Family history of Family Health Status Number Of Children   No children  10. Family history of Fibromyalgia : Mother  79. Family history of Gastric Ulcer : Mother  82. Family history of Glioblastoma Multiforme (Grade IV) Of The Brain : Father  28. Family history of Heart Disease : Mother  55. Family history of Malignant Melanoma Of The Skin : Sister  34. Family history of Pure Hypercholesterolemia : Father  24. Family history of Skin Cancer : Mother  19. Family history of Skin Cancer : Father  32. Family history of Stroke Syndrome : Mother  Social History Problems   1. Denied: History of Alcohol Use (History)  2. Caffeine Use   2-3 a day  3. Marital History - Currently Married  4. Never A Smoker  5. 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 Denied: History of Tobacco Use  Review of Systems Genitourinary, constitutional, skin, eye, otolaryngeal, hematologic/lymphatic, cardiovascular, pulmonary, endocrine, musculoskeletal, gastrointestinal, neurological and psychiatric system(s) were reviewed and pertinent findings if present are noted and are otherwise negative.  Genitourinary: suprapubic pain.    Physical Exam Constitutional: Well nourished and well developed . No acute distress.   Pulmonary: No respiratory distress and normal respiratory rhythm and effort.   Cardiovascular: Heart rate and rhythm are normal . No peripheral edema.    Results/Data  The following clinical lab reports were reviewed:  A UA is  pending.    Assessment Assessed   1. 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   Plan  I am going to have her set up for a Cystoscopy with HOD and urethral dilation with instillation of clorpactin.    She is aware of the risks.

## 2015-06-02 ENCOUNTER — Encounter (HOSPITAL_BASED_OUTPATIENT_CLINIC_OR_DEPARTMENT_OTHER): Payer: Self-pay | Admitting: Anesthesiology

## 2015-06-02 ENCOUNTER — Ambulatory Visit (HOSPITAL_BASED_OUTPATIENT_CLINIC_OR_DEPARTMENT_OTHER): Payer: PRIVATE HEALTH INSURANCE | Admitting: Anesthesiology

## 2015-06-02 ENCOUNTER — Encounter (HOSPITAL_BASED_OUTPATIENT_CLINIC_OR_DEPARTMENT_OTHER)
Admission: RE | Disposition: A | Discharge status: Home / Self Care | Payer: Self-pay | Source: Ambulatory Visit | Attending: Urology

## 2015-06-02 ENCOUNTER — Ambulatory Visit (HOSPITAL_BASED_OUTPATIENT_CLINIC_OR_DEPARTMENT_OTHER)
Admission: RE | Admit: 2015-06-02 | Discharge: 2015-06-02 | Disposition: A | Discharge status: Home / Self Care | Payer: PRIVATE HEALTH INSURANCE | Source: Ambulatory Visit | Attending: Urology | Admitting: Urology

## 2015-06-02 ENCOUNTER — Other Ambulatory Visit: Payer: Self-pay

## 2015-06-02 DIAGNOSIS — Z8673 Personal history of transient ischemic attack (TIA), and cerebral infarction without residual deficits: Secondary | ICD-10-CM | POA: Diagnosis not present

## 2015-06-02 DIAGNOSIS — E7800 Pure hypercholesterolemia, unspecified: Secondary | ICD-10-CM | POA: Diagnosis not present

## 2015-06-02 DIAGNOSIS — Z79899 Other long term (current) drug therapy: Secondary | ICD-10-CM | POA: Diagnosis not present

## 2015-06-02 DIAGNOSIS — J45909 Unspecified asthma, uncomplicated: Secondary | ICD-10-CM | POA: Insufficient documentation

## 2015-06-02 DIAGNOSIS — Z9049 Acquired absence of other specified parts of digestive tract: Secondary | ICD-10-CM | POA: Insufficient documentation

## 2015-06-02 DIAGNOSIS — M1990 Unspecified osteoarthritis, unspecified site: Secondary | ICD-10-CM | POA: Insufficient documentation

## 2015-06-02 DIAGNOSIS — Z7982 Long term (current) use of aspirin: Secondary | ICD-10-CM | POA: Insufficient documentation

## 2015-06-02 DIAGNOSIS — K589 Irritable bowel syndrome without diarrhea: Secondary | ICD-10-CM | POA: Diagnosis not present

## 2015-06-02 DIAGNOSIS — M797 Fibromyalgia: Secondary | ICD-10-CM | POA: Diagnosis not present

## 2015-06-02 DIAGNOSIS — E559 Vitamin D deficiency, unspecified: Secondary | ICD-10-CM | POA: Insufficient documentation

## 2015-06-02 DIAGNOSIS — F419 Anxiety disorder, unspecified: Secondary | ICD-10-CM | POA: Diagnosis not present

## 2015-06-02 DIAGNOSIS — K219 Gastro-esophageal reflux disease without esophagitis: Secondary | ICD-10-CM | POA: Diagnosis not present

## 2015-06-02 DIAGNOSIS — I10 Essential (primary) hypertension: Secondary | ICD-10-CM | POA: Diagnosis not present

## 2015-06-02 DIAGNOSIS — N3010 Interstitial cystitis (chronic) without hematuria: Secondary | ICD-10-CM | POA: Insufficient documentation

## 2015-06-02 HISTORY — PX: CYSTO WITH HYDRODISTENSION: SHX5453

## 2015-06-02 LAB — POCT I-STAT, CHEM 8
BUN: 13 mg/dL (ref 6–20)
Calcium, Ion: 1.23 mmol/L (ref 1.13–1.30)
Chloride: 102 mmol/L (ref 101–111)
Creatinine, Ser: 0.7 mg/dL (ref 0.44–1.00)
Glucose, Bld: 98 mg/dL (ref 65–99)
HCT: 45 % (ref 36.0–46.0)
Hemoglobin: 15.3 g/dL — ABNORMAL HIGH (ref 12.0–15.0)
Potassium: 3.7 mmol/L (ref 3.5–5.1)
Sodium: 141 mmol/L (ref 135–145)
TCO2: 24 mmol/L (ref 0–100)

## 2015-06-02 SURGERY — CYSTOSCOPY, WITH BLADDER HYDRODISTENSION
Anesthesia: Monitor Anesthesia Care | Site: Bladder

## 2015-06-02 MED ORDER — PROPOFOL 500 MG/50ML IV EMUL
INTRAVENOUS | Status: DC | PRN
  Administered 2015-06-02: 50 ug/kg/min via INTRAVENOUS

## 2015-06-02 MED ORDER — OXYCHLOROSENE SODIUM POWD
Status: DC | PRN
  Administered 2015-06-02: 1

## 2015-06-02 MED ORDER — PROPOFOL 10 MG/ML IV BOLUS
INTRAVENOUS | Status: AC
  Filled 2015-06-02: qty 20

## 2015-06-02 MED ORDER — MEPERIDINE HCL 25 MG/ML IJ SOLN
INTRAMUSCULAR | Status: AC
  Filled 2015-06-02: qty 1

## 2015-06-02 MED ORDER — MIDAZOLAM HCL 5 MG/5ML IJ SOLN
INTRAMUSCULAR | Status: DC | PRN
  Administered 2015-06-02: 2 mg via INTRAVENOUS

## 2015-06-02 MED ORDER — SODIUM CHLORIDE 0.9% FLUSH
3.0000 mL | Freq: Two times a day (BID) | INTRAVENOUS | Status: DC
  Filled 2015-06-02: qty 3

## 2015-06-02 MED ORDER — MEPERIDINE HCL 50 MG/ML IJ SOLN: 75.0000 mg | Freq: Once | INTRAMUSCULAR | Status: DC

## 2015-06-02 MED ORDER — SODIUM CHLORIDE 0.9 % IV SOLN
INTRAVENOUS | Status: DC
  Administered 2015-06-02: 08:00:00 via INTRAVENOUS
  Filled 2015-06-02: qty 1000

## 2015-06-02 MED ORDER — FENTANYL CITRATE (PF) 100 MCG/2ML IJ SOLN
INTRAMUSCULAR | Status: AC
  Filled 2015-06-02: qty 2

## 2015-06-02 MED ORDER — SODIUM CHLORIDE 0.9% FLUSH
3.0000 mL | INTRAVENOUS | Status: DC | PRN
  Filled 2015-06-02: qty 3

## 2015-06-02 MED ORDER — CEFAZOLIN SODIUM-DEXTROSE 2-3 GM-% IV SOLR
2.0000 g | INTRAVENOUS | Status: AC
  Administered 2015-06-02: 2 g via INTRAVENOUS
  Filled 2015-06-02: qty 50

## 2015-06-02 MED ORDER — ACETAMINOPHEN 325 MG PO TABS
650.0000 mg | ORAL_TABLET | ORAL | Status: DC | PRN
  Filled 2015-06-02: qty 2

## 2015-06-02 MED ORDER — MEPERIDINE HCL 25 MG/ML IJ SOLN
INTRAMUSCULAR | Status: AC
  Filled 2015-06-02: qty 2

## 2015-06-02 MED ORDER — ONDANSETRON HCL 4 MG/2ML IJ SOLN
4.0000 mg | Freq: Four times a day (QID) | INTRAMUSCULAR | Status: DC | PRN
  Filled 2015-06-02: qty 2

## 2015-06-02 MED ORDER — LIDOCAINE HCL (CARDIAC) 20 MG/ML IV SOLN
INTRAVENOUS | Status: AC
  Filled 2015-06-02: qty 5

## 2015-06-02 MED ORDER — FENTANYL CITRATE (PF) 100 MCG/2ML IJ SOLN
25.0000 ug | INTRAMUSCULAR | Status: DC | PRN
  Filled 2015-06-02: qty 1

## 2015-06-02 MED ORDER — DEXAMETHASONE SODIUM PHOSPHATE 4 MG/ML IJ SOLN
INTRAMUSCULAR | Status: DC | PRN
Start: 2015-06-02 — End: 2015-06-02
  Administered 2015-06-02: 10 mg via INTRAVENOUS

## 2015-06-02 MED ORDER — MEPERIDINE HCL 100 MG/ML IJ SOLN
75.0000 mg | Freq: Once | INTRAMUSCULAR | Status: AC
  Administered 2015-06-02: 75 mg via INTRAMUSCULAR
  Filled 2015-06-02: qty 1

## 2015-06-02 MED ORDER — KETOROLAC TROMETHAMINE 30 MG/ML IJ SOLN
INTRAMUSCULAR | Status: DC | PRN
  Administered 2015-06-02: 30 mg via INTRAVENOUS

## 2015-06-02 MED ORDER — LIDOCAINE HCL (CARDIAC) 20 MG/ML IV SOLN
INTRAVENOUS | Status: DC | PRN
  Administered 2015-06-02: 50 mg via INTRAVENOUS

## 2015-06-02 MED ORDER — MEPERIDINE HCL 100 MG/ML IJ SOLN
75.0000 mg | Freq: Once | INTRAMUSCULAR | Status: DC
Start: 2015-06-02 — End: 2015-06-02
  Filled 2015-06-02: qty 1

## 2015-06-02 MED ORDER — ONDANSETRON HCL 4 MG/2ML IJ SOLN
INTRAMUSCULAR | Status: AC
  Filled 2015-06-02: qty 2

## 2015-06-02 MED ORDER — ONDANSETRON HCL 4 MG/2ML IJ SOLN
INTRAMUSCULAR | Status: DC | PRN
  Administered 2015-06-02: 4 mg via INTRAVENOUS

## 2015-06-02 MED ORDER — ACETAMINOPHEN 500 MG PO TABS
ORAL_TABLET | ORAL | Status: AC
  Filled 2015-06-02: qty 1

## 2015-06-02 MED ORDER — SODIUM CHLORIDE 0.9 % IV SOLN
250.0000 mL | INTRAVENOUS | Status: DC | PRN
  Filled 2015-06-02: qty 250

## 2015-06-02 MED ORDER — CEFAZOLIN SODIUM-DEXTROSE 2-3 GM-% IV SOLR
INTRAVENOUS | Status: AC
  Filled 2015-06-02: qty 50

## 2015-06-02 MED ORDER — PROMETHAZINE HCL 25 MG/ML IJ SOLN
25.0000 mg | Freq: Once | INTRAMUSCULAR | Status: AC
  Administered 2015-06-02: 25 mg via INTRAMUSCULAR
  Filled 2015-06-02: qty 1

## 2015-06-02 MED ORDER — FENTANYL CITRATE (PF) 100 MCG/2ML IJ SOLN
INTRAMUSCULAR | Status: DC | PRN
  Administered 2015-06-02: 100 ug via INTRAVENOUS

## 2015-06-02 MED ORDER — PROMETHAZINE HCL 25 MG/ML IJ SOLN: 25.0000 mg | Freq: Once | INTRAMUSCULAR | Status: DC

## 2015-06-02 MED ORDER — MIDAZOLAM HCL 2 MG/2ML IJ SOLN
INTRAMUSCULAR | Status: AC
  Filled 2015-06-02: qty 2

## 2015-06-02 MED ORDER — PROMETHAZINE HCL 25 MG/ML IJ SOLN
INTRAMUSCULAR | Status: AC
  Filled 2015-06-02: qty 1

## 2015-06-02 MED ORDER — ACETAMINOPHEN 650 MG RE SUPP
650.0000 mg | RECTAL | Status: DC | PRN
  Filled 2015-06-02: qty 1

## 2015-06-02 MED ORDER — KETOROLAC TROMETHAMINE 30 MG/ML IJ SOLN
INTRAMUSCULAR | Status: AC
  Filled 2015-06-02: qty 1

## 2015-06-02 MED ORDER — DEXAMETHASONE SODIUM PHOSPHATE 10 MG/ML IJ SOLN
INTRAMUSCULAR | Status: AC
  Filled 2015-06-02: qty 1

## 2015-06-02 MED ORDER — STERILE WATER FOR IRRIGATION IR SOLN
Status: DC | PRN
  Administered 2015-06-02: 1000 mL
  Administered 2015-06-02: 3000 mL

## 2015-06-02 MED ORDER — PROMETHAZINE HCL 25 MG/ML IJ SOLN
25.0000 mg | Freq: Four times a day (QID) | INTRAMUSCULAR | Status: DC | PRN
  Administered 2015-06-02: 25 mg via INTRAVENOUS
  Filled 2015-06-02: qty 1

## 2015-06-02 MED ORDER — ACETAMINOPHEN 500 MG PO TABS
ORAL_TABLET | ORAL | Status: AC
  Filled 2015-06-02: qty 2

## 2015-06-02 SURGICAL SUPPLY — 20 items
BAG DRAIN URO-CYSTO SKYTR STRL (DRAIN) ×2 IMPLANT
BAG DRN UROCATH (DRAIN) ×1
CATH ROBINSON RED A/P 16FR (CATHETERS) ×2 IMPLANT
CLOTH BEACON ORANGE TIMEOUT ST (SAFETY) ×2 IMPLANT
ELECT REM PT RETURN 9FT ADLT (ELECTROSURGICAL) ×2
ELECTRODE REM PT RTRN 9FT ADLT (ELECTROSURGICAL) ×1 IMPLANT
GLOVE SURG SS PI 8.0 STRL IVOR (GLOVE) ×2 IMPLANT
GOWN STRL REUS W/ TWL XL LVL3 (GOWN DISPOSABLE) ×1 IMPLANT
GOWN STRL REUS W/TWL XL LVL3 (GOWN DISPOSABLE) ×6
KIT ROOM TURNOVER WOR (KITS) ×2 IMPLANT
MANIFOLD NEPTUNE II (INSTRUMENTS) ×1 IMPLANT
NDL SAFETY ECLIPSE 18X1.5 (NEEDLE) ×1 IMPLANT
NEEDLE HYPO 18GX1.5 SHARP (NEEDLE) ×2
NS IRRIG 500ML POUR BTL (IV SOLUTION) IMPLANT
PACK CYSTO (CUSTOM PROCEDURE TRAY) ×2 IMPLANT
SYR 30ML LL (SYRINGE) ×2 IMPLANT
SYRINGE 10CC LL (SYRINGE) ×1 IMPLANT
TUBE CONNECTING 12X1/4 (SUCTIONS) ×1 IMPLANT
WATER STERILE IRR 3000ML UROMA (IV SOLUTION) ×2 IMPLANT
WATER STERILE IRR 500ML POUR (IV SOLUTION) ×1 IMPLANT

## 2015-06-02 NOTE — Transfer of Care (Signed)
Immediate Anesthesia Transfer of Care Note  Patient: Brenda [name]  Procedure(s) Performed: Procedure(s): CYSTOSCOPY/HYDRODISTENSION WITH INSTILLATION OF CLORPACTIN  (N/A)  Patient Location: PACU  Anesthesia Type:MAC  Level of Consciousness: awake, alert  and oriented  Airway & Oxygen Therapy: Patient Spontanous Breathing and Patient connected to face mask oxygen  Post-op Assessment: Report given to RN  Post vital signs: Reviewed and stable  Last Vitals:115/72, 100%  Filed Vitals:   06/02/15 0729 06/02/15 0918  BP: 123/77   Pulse: 69 66  Temp: 36.4 C   Resp: 16 14    Complications: No apparent anesthesia complications

## 2015-06-02 NOTE — Discharge Instructions (Addendum)

## 2015-06-02 NOTE — Brief Op Note (Signed)
06/02/2015  8:58 AM  PATIENT:  Brunetta Genera  65 y.o. female  PRE-OPERATIVE DIAGNOSIS:  interstitial cystitis  POST-OPERATIVE DIAGNOSIS:  Interstitial cystitis  PROCEDURE:  Procedure(s): CYSTOSCOPY/HYDRODISTENSION WITH INSTILLATION OF CLORPACTIN  (N/A)  SURGEON:  Surgeon(s) and Role:    * [name], MD - Primary  PHYSICIAN ASSISTANT:   ASSISTANTS: none   ANESTHESIA:   MAC  EBL:  Total I/O In: 150 [I.V.:150] Out: -   BLOOD ADMINISTERED:none  DRAINS: Urinary Catheter (Foley)   LOCAL MEDICATIONS USED:  OTHER Tetracaine 1:500 76ml  SPECIMEN:  No Specimen  DISPOSITION OF SPECIMEN:  N/A  COUNTS:  YES  TOURNIQUET:  * No tourniquets in log *  DICTATION: .Dictated  PLAN OF CARE: Discharge to home after PACU  PATIENT DISPOSITION:  PACU - hemodynamically stable.   Delay start of Pharmacological VTE agent (>24hrs) due to surgical blood loss or risk of bleeding: not applicable

## 2015-06-02 NOTE — Anesthesia Postprocedure Evaluation (Signed)
Anesthesia Post Note  Patient: Brenda [name]  Procedure(s) Performed: Procedure(s) (LRB): CYSTOSCOPY/HYDRODISTENSION WITH INSTILLATION OF CLORPACTIN  (N/A)  Patient location during evaluation: PACU Anesthesia Type: MAC Level of consciousness: awake and alert Pain management: pain level controlled Vital Signs Assessment: post-procedure vital signs reviewed and stable Respiratory status: spontaneous breathing, nonlabored ventilation, respiratory function stable and patient connected to nasal cannula oxygen Cardiovascular status: stable and blood pressure returned to baseline Anesthetic complications: no    Last Vitals:  Filed Vitals:   06/02/15 0945 06/02/15 1040  BP: 114/83 117/67  Pulse: 68 62  Temp:  36.5 C  Resp: 16 16    Last Pain:  Filed Vitals:   06/02/15 1040  PainSc: Asleep                 Kiyana Vazguez S

## 2015-06-02 NOTE — Anesthesia Preprocedure Evaluation (Signed)
Anesthesia Evaluation  Patient identified by MRN, date of birth, ID band Patient awake    Reviewed: Allergy & Precautions, NPO status , Patient's Chart, lab work & pertinent test results  History of Anesthesia Complications (+) history of anesthetic complications  Airway Mallampati: II   Neck ROM: full    Dental   Pulmonary asthma ,    breath sounds clear to auscultation       Cardiovascular hypertension,  Rhythm:regular Rate:Normal     Neuro/Psych  Headaches, PSYCHIATRIC DISORDERS Anxiety  Neuromuscular disease CVA, No Residual Symptoms    GI/Hepatic GERD  ,IBS.   Endo/Other    Renal/GU      Musculoskeletal  (+) Arthritis , Fibromyalgia -  Abdominal   Peds  Hematology   Anesthesia Other Findings   Reproductive/Obstetrics                             Anesthesia Physical Anesthesia Plan  ASA: III  Anesthesia Plan: MAC   Post-op Pain Management:    Induction: Intravenous  Airway Management Planned: Simple Face Mask  Additional Equipment:   Intra-op Plan:   Post-operative Plan:   Informed Consent: I have reviewed the patients History and Physical, chart, labs and discussed the procedure including the risks, benefits and alternatives for the proposed anesthesia with the patient or authorized representative who has indicated his/her understanding and acceptance.     Plan Discussed with: CRNA, Anesthesiologist and Surgeon  Anesthesia Plan Comments:         Anesthesia Quick Evaluation

## 2015-06-02 NOTE — Anesthesia Procedure Notes (Signed)
Procedure Name: MAC Date/Time: 06/02/2015 8:40 AM Performed by: Bethena Roys T Oxygen Delivery Method: Simple face mask Placement Confirmation: positive ETCO2

## 2015-06-03 ENCOUNTER — Encounter (HOSPITAL_BASED_OUTPATIENT_CLINIC_OR_DEPARTMENT_OTHER): Payer: Self-pay | Admitting: Urology

## 2015-06-03 NOTE — Op Note (Signed)
NAME:  Brenda [name], Brenda [name]                    ACCOUNT NO.:  MEDICAL RECORD NO.:  [id number]  LOCATION:                                 FACILITY:  PHYSICIAN:  Marshall Cork. Jeffie Pollock, M.D.         DATE OF BIRTH:  DATE OF PROCEDURE:  06/02/2015 DATE OF DISCHARGE:                              OPERATIVE REPORT   PROCEDURES: 1. Cystoscopy with hydrodistention of the bladder. 2. Instillation of Clorpactin and tetracaine.  PREOPERATIVE DIAGNOSIS:  Interstitial cystitis.  POSTOPERATIVE DIAGNOSIS:  Interstitial cystitis.  SURGEON:  Marshall Cork. Jeffie Pollock, M.D.  ANESTHESIA:  General.  SPECIMEN:  None.  DRAINS:  A 16-French silastic catheter.  BLOOD LOSS:  None.  COMPLICATIONS:  None.  INDICATIONS:  Brenda [name] is a 65 year old white female with a history of interstitial cystitis, who requires periodic hydrodistention and instillation of Clorpactin.  FINDINGS OF PROCEDURE:  She was given antibiotic, taken to the operating room.  She was given monitored anesthesia care.  She was placed in lithotomy position, prepped with Betadine solution and draped in usual sterile fashion.  Cystoscopy was performed using the 23-French scope and 30-degree lens. Examination revealed a normal urethra.  The bladder wall had mild trabeculation.  No tumors, stones, or inflammation were noted.  Ureteral orifices were unremarkable.  The bladder was then instilled to capacity under 80 cm of water pressure.  This was held for approximately 2 minutes.  The bladder was then drained.  Her capacity under anesthesia was 500 mL.  There was diffuse glomerular hemorrhages noted consistent with a diagnosis of interstitial cystitis.  After the bladder was drained, the cystoscope was removed and a 16- [place] silastic Foley catheter was inserted.  The balloon was filled with 10 mL of sterile water.  She was then instilled with 30 mL of tetracaine one in 500 solution, this was only left for 5 minutes.  The bladder was drained.  She was  then instilled with 120 mL of Clorpactin 1 amp and a liter of water.  This was left indwelling for approximately 3 minutes and the bladder was drained.  An additional 15 mL of tetracaine solution was then instilled.  This was left in indwelling for approximately 5 minutes.  The bladder was placed to drainage.  The patient was taken down from lithotomy position.  Her anesthetic was reversed.  She was moved to the recovery room in stable condition. There were no complications.     Marshall Cork. Jeffie Pollock, M.D.     JJW/MEDQ  D:  06/02/2015  T:  06/02/2015  Job:  [id number]

## 2015-06-14 ENCOUNTER — Ambulatory Visit
Admission: RE | Admit: 2015-06-14 | Discharge: 2015-06-14 | Disposition: A | Discharge status: Home / Self Care | Payer: PRIVATE HEALTH INSURANCE | Source: Ambulatory Visit

## 2015-06-14 DIAGNOSIS — Z1231 Encounter for screening mammogram for malignant neoplasm of breast: Secondary | ICD-10-CM

## 2015-06-14 DIAGNOSIS — Z9889 Other specified postprocedural states: Secondary | ICD-10-CM

## 2015-08-23 ENCOUNTER — Other Ambulatory Visit (HOSPITAL_COMMUNITY): Payer: Self-pay | Admitting: Neurological Surgery

## 2015-08-23 DIAGNOSIS — M5412 Radiculopathy, cervical region: Secondary | ICD-10-CM

## 2015-08-29 ENCOUNTER — Ambulatory Visit (HOSPITAL_COMMUNITY)
Admission: RE | Admit: 2015-08-29 | Discharge: 2015-08-29 | Disposition: A | Discharge status: Home / Self Care | Payer: PRIVATE HEALTH INSURANCE | Source: Ambulatory Visit | Attending: Neurological Surgery | Admitting: Neurological Surgery

## 2015-08-29 DIAGNOSIS — M4802 Spinal stenosis, cervical region: Secondary | ICD-10-CM | POA: Insufficient documentation

## 2015-08-29 DIAGNOSIS — M5412 Radiculopathy, cervical region: Secondary | ICD-10-CM | POA: Diagnosis not present

## 2015-08-29 DIAGNOSIS — M4682 Other specified inflammatory spondylopathies, cervical region: Secondary | ICD-10-CM | POA: Diagnosis not present

## 2016-03-19 ENCOUNTER — Telehealth: Payer: Self-pay | Admitting: Pulmonary Disease

## 2016-03-19 ENCOUNTER — Ambulatory Visit (INDEPENDENT_AMBULATORY_CARE_PROVIDER_SITE_OTHER): Payer: PRIVATE HEALTH INSURANCE | Admitting: Pulmonary Disease

## 2016-03-19 ENCOUNTER — Encounter: Payer: Self-pay | Admitting: Pulmonary Disease

## 2016-03-19 DIAGNOSIS — I10 Essential (primary) hypertension: Secondary | ICD-10-CM | POA: Insufficient documentation

## 2016-03-19 DIAGNOSIS — J45909 Unspecified asthma, uncomplicated: Secondary | ICD-10-CM | POA: Diagnosis not present

## 2016-03-19 DIAGNOSIS — K589 Irritable bowel syndrome without diarrhea: Secondary | ICD-10-CM | POA: Insufficient documentation

## 2016-03-19 DIAGNOSIS — N3010 Interstitial cystitis (chronic) without hematuria: Secondary | ICD-10-CM | POA: Insufficient documentation

## 2016-03-19 DIAGNOSIS — K834 Spasm of sphincter of Oddi: Secondary | ICD-10-CM | POA: Insufficient documentation

## 2016-03-19 DIAGNOSIS — M1990 Unspecified osteoarthritis, unspecified site: Secondary | ICD-10-CM | POA: Insufficient documentation

## 2016-03-19 DIAGNOSIS — Z8719 Personal history of other diseases of the digestive system: Secondary | ICD-10-CM | POA: Insufficient documentation

## 2016-03-19 DIAGNOSIS — R059 Cough, unspecified: Secondary | ICD-10-CM

## 2016-03-19 DIAGNOSIS — J302 Other seasonal allergic rhinitis: Secondary | ICD-10-CM | POA: Diagnosis not present

## 2016-03-19 DIAGNOSIS — K219 Gastro-esophageal reflux disease without esophagitis: Secondary | ICD-10-CM | POA: Diagnosis not present

## 2016-03-19 DIAGNOSIS — R05 Cough: Secondary | ICD-10-CM | POA: Diagnosis not present

## 2016-03-19 DIAGNOSIS — M797 Fibromyalgia: Secondary | ICD-10-CM | POA: Insufficient documentation

## 2016-03-19 DIAGNOSIS — Z8673 Personal history of transient ischemic attack (TIA), and cerebral infarction without residual deficits: Secondary | ICD-10-CM | POA: Insufficient documentation

## 2016-03-19 DIAGNOSIS — J4540 Moderate persistent asthma, uncomplicated: Secondary | ICD-10-CM | POA: Insufficient documentation

## 2016-03-19 NOTE — Telephone Encounter (Signed)
PFT 10/20/13: FVC 2.14 L (86%) FEV1 1.98 L (92%) FEV1/FVC 0.82 FEF 25-75 2.14 L (104%) negative bronchodilator response TLC 3.73 L (83%) RV 70% ERV 65% DLCO uncorrected 92%  IMAGING CT CHEST W/O 03/28/15 (personally reviewed by me):  No parenchymal nodule or opacity appreciated. No pleural effusion or thickening. No pericardial effusion. No pathologic mediastinal adenopathy.  LABS 06/02/15 HGB:  15.3

## 2016-03-19 NOTE — Patient Instructions (Signed)
   Call me if your cough doesn't continue to improve or you have any new breathing problems/questions before your next appointment.  We will plan on a breathing test and lab work at your next appointment.  TESTS ORDERED: 1. Full PFTs at follow-up appointment 2. Esophagram/Barium Swallow

## 2016-03-19 NOTE — Progress Notes (Signed)
Subjective:    Patient ID: Brenda [name], female    DOB: 03/13/1951, 65 y.o.   MRN: [id number]  HPI She reports as a child she had multiple allergies and has been on immunotherapy in the past. She reports she has also been diagnosed with asthma in the past. She does have occasional dyspnea. She has a Ventolin inhaler that she uses rarely. She reports for the last 3-4 years she has had She reports she has had "bronchitis" nearly every Fall for the last 3-4 years. She reports this time she has had a cough for approximately 7-10 weeks. She reports her cough started before she began to notice increased sinus congestion with "popping" in her ears with drainage. She reports she has 3 rounds of antibiotics, oral Prednisone, & IM shots of steroids since her cough started. She reports her cough has only seemed to be improving in the last couple of days. She reports she has a "constricted" sensation in her chest during the day. Reports only mild chest discomfort with her chronic cough. She reports she does cough during the day. She does report her cough is worse with exposure to perfumes and strong odors. She reports no improvement in her cough since she has been on Protonix. She denies any significant reflux or dyspepsia. She reports morning brash water taste a couple of times weekly depending on diet. No rashes or abnormal bruising. Previously was on Atrovent. She was also on Breo 100 last year as well as this year. She has been on Breo for 2 weeks this time. She uses Flonase intermittently for symptoms as well as Singulair. Patient denies any subjective fever, chills or sweats.  Review of Systems She reports swelling in her fingers. She denies any joint erythema. Fingers are stiff in the morning and lasts up to hours in the morning. She reports diffusion body pain. A pertinent 14 point review of systems is negative except as per the history of presenting illness.  Allergies  Allergen Reactions  . Latex Rash    . Adhesive [Tape] Other (See Comments)    TEARS SKIN  . Clarithromycin Nausea Only and Other (See Comments)    TACHYCARDIA  . Codeine Other (See Comments)    SEVERE ELEVATED LIVER ENZYMES  . Cymbalta [Duloxetine Hcl] Other (See Comments)    Blurred vision  . Dilaudid [Hydromorphone Hcl] Itching  . Elavil [Amitriptyline Hcl] Other (See Comments)    Elevated liver enzymes  . Hydrocodone Other (See Comments)    Elevated liver enzymes  . Lactated Ringers Other (See Comments)    COULD CAUSE PANIC ATTACK  . Levbid [Hyoscyamine Sulfate] Other (See Comments)    Dizziness, nausea, headache  . Lisinopril Other (See Comments)    Dry cough  . Marcaine [Bupivacaine Hcl] Other (See Comments)    TACHYCARDIA, PRESYNCOPE  . Pentazocine Lactate Other (See Comments)    tachycardia  . Percodan [Oxycodone-Aspirin] Nausea Only  . Stadol [Butorphanol Tartrate] Swelling    Lips numb, tongue swell, tachycardia  . Trazodone And Nefazodone Other (See Comments)    tachycardia  . Vistaril [Hydroxyzine Hcl] Other (See Comments)    Altered sensorium  . Morphine And Related Itching and Rash    Current Outpatient Prescriptions on File Prior to Visit  Medication Sig Dispense Refill  . albuterol (VENTOLIN HFA) 108 (90 Base) MCG/ACT inhaler Inhale into the lungs every 6 (six) hours as needed for wheezing or shortness of breath.    Marland Kitchen BIOTIN PO Take by mouth.    Marland Kitchen  Calcium Carbonate-Vitamin D (CALCIUM + D PO) Take by mouth daily. 1 tablet daily    . estradiol (VIVELLE-DOT) 0.1 MG/24HR Place 1 patch onto the skin 2 (two) times a week.    . ezetimibe (ZETIA) 10 MG tablet Take 10 mg by mouth daily.     . fish oil-omega-3 fatty acids 1000 MG capsule Take 1 g by mouth daily.     . fluticasone (FLONASE) 50 MCG/ACT nasal spray Place 2 sprays into the nose daily.    . folic acid (FOLVITE) 1 MG tablet Take 1 mg by mouth daily.    Marland Kitchen LORazepam (ATIVAN) 1 MG tablet 1/2 to 1 tab twice daily    . metoprolol (LOPRESSOR) 50  MG tablet Take 25 mg by mouth 2 (two) times daily.     . montelukast (SINGULAIR) 10 MG tablet Take 10 mg by mouth at bedtime.    Marland Kitchen olmesartan (BENICAR) 20 MG tablet Take 20 mg by mouth daily.    . Red Yeast Rice Extract (RED YEAST RICE PO) Take by mouth.    . Vitamin D, Ergocalciferol, (DRISDOL) 50000 UNITS CAPS Take 50,000 Units by mouth every 7 (seven) days.      . meperidine (DEMEROL) 50 MG/ML injection Inject 1.5 mLs (75 mg total) into the muscle once. (Patient not taking: Reported on 03/19/2016) 1 mL 0  . promethazine (PHENERGAN) 25 MG/ML injection Inject 1 mL (25 mg total) into the muscle once. (Patient not taking: Reported on 03/19/2016) 1 mL 0   No current facility-administered medications on file prior to visit.     Past Medical History:  Diagnosis Date  . Allergic rhinitis   . Anxiety   . Arthritis   . Asthma   . Bladder pain   . Complication of anesthesia PAIN ATTACKS   LACTATED RINGERS- CONTRAINDICATED  . Fibromyalgia   . Frequency of urination   . GERD (gastroesophageal reflux disease)   . Heart murmur   . History of pancreatitis 1988-1989  . Hyperlipidemia   . Hypertension LABILE  . IBS (irritable bowel syndrome)   . Interstitial cystitis   . Migraines   . Normal cardiac stress test JULY 2009---  NORMAL  . 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 Spasm of sphincter of Oddi 1987  . Urethral stenosis S/P DILATIONS  . Urgency of urination     Past Surgical History:  Procedure Laterality Date  . ABDOMINAL HYSTERECTOMY  1988  . APPENDECTOMY  04/ 1986  . bilateral reduction mastopexy  1988  . bilateral turbinate resection  1990   nasal spreading graft  . CHOLECYSTECTOMY  04/ 1986  . COLONOSCOPY  02/2004, 11/2010   2005: Normal - Dr. Sammuel Cooper 2012: Normal  . CYSTO WITH HYDRODISTENSION N/A 12/17/2013   Procedure: CYSTO HYDRODISTENSION WITH INSTALLATION OF CHLOROPACTIN AND TETRACAINE;   Surgeon: Malka So, MD;  Location: [facility];  Service: Urology;  Laterality: N/A;  . CYSTO WITH HYDRODISTENSION N/A 06/02/2015   Procedure: CYSTOSCOPY/HYDRODISTENSION WITH INSTILLATION OF CLORPACTIN ;  Surgeon: [name], MD;  Location: [facility];  Service: Urology;  Laterality: N/A;  . CYSTOSCOPY WITH URETHRAL DILATATION  07/12/2011   Procedure: CYSTOSCOPY WITH URETHRAL DILATATION;  Surgeon: Malka So, MD;  Location: [facility];  Service: Urology;  Laterality: N/A;  HOD AND INSTILLATION OF CHLOROPACTIN C-ARM   . CYSTOSCOPY WITH URETHRAL DILATATION  N/A 06/26/2012   Procedure: CYSTOSCOPY WITH URETHRAL DILATATION HYDRODISTENSION AND INSTILLATION OF CLORPACTION ;  Surgeon: Malka So, MD;  Location: [facility];  Service: Urology;  Laterality: N/A;  CYSTOSCOPY WITH URETHRAL DILATATION HYDRODISTENSION AND INSTILLATION OF CLORPACTION   . CYSTOSTOMY W/ BLADDER BIOPSY  1983  . DILATION AND CURETTAGE OF UTERUS  1987  . ERCP  1988   for pancreatitis with stents   S/P SEVERE PANCREATITIS  . HEMORRHOID SURGERY    . LASIK Bilateral APRIL 2005  . MULTIPLE CYSTO/ HOD/ URETHRAL DILATION/ INSTILLATION CLORPACTIN  .LAST ONE 03-31-2010  . NASAL SINUS SURGERY  1977   sinus cyst  . revision rhinoplasty  1978  . RHINOPLASTY  1977  . RIGHT SALPINGOOPHECTOMY  1998  . TONSILLECTOMY AND ADENOIDECTOMY  1963    Family History  Problem Relation Age of Onset  . Colon cancer Maternal Grandmother   . Colon cancer Cousin   . Allergies Mother   . Asthma Mother   . Heart disease Mother   . Osteoarthritis Mother   . Diabetes Mother   . Brain cancer Father   . Osteoarthritis Father   . Melanoma Sister   . Osteoarthritis Sister   . Diabetes Sister   . Heart disease Sister   . Stroke Sister   . Stomach cancer Neg Hx   . Rheumatologic disease Neg Hx     Social History   Social History  . Marital status: Married    Spouse name: N/A  .  Number of children: N/A  . Years of education: N/A   Social History Main Topics  . Smoking status: Passive Smoke Exposure - Never Smoker  . Smokeless tobacco: Never Used     Comment: Father smoked briefly  . Alcohol use No  . Drug use: No  . Sexual activity: Not Asked   Other Topics Concern  . None   Social History Narrative   Married, lives with spouse   No children   RN at [employer]   No recent travel       Pulmonary:   Originally from [place]. Always lived in [place]. Previously has traveled to [place], [place], [place], [place], & mostly [place]. No indoor pets currently. She does have cats in her garage. No bird, mold, or hot tub exposure. No indoor plants. Mostly wood floors. She did have her bedroom carpet taken up in Summer 2017. Has mostly blinds. No wood burning fire place. Previously enjoyed Firefighter & playing piano.       Objective:   Physical Exam BP 106/62 (BP Location: Right Arm, Cuff Size: Normal)   Pulse 62   Ht 5' (1.524 m)   Wt 145 lb 9.6 oz (66 kg)   SpO2 95%   BMI 28.44 kg/m  General:  Awake. Alert. No acute distress.   Integument:  Warm & dry. No rash on exposed skin. No bruising. Lymphatics:  No appreciated cervical or supraclavicular lymphadenoapthy. HEENT:  Moist mucus membranes. No oral ulcers. No scleral injection or icterus. Mild bilateral nasal turbinate swelling. Cardiovascular:  Regular rate. No edema. Normal S1 & S2. Pulmonary:  Good aeration & clear to auscultation bilaterally. Symmetric chest wall expansion. No accessory muscle use. Abdomen: Soft. Normal bowel sounds. Nondistended. Musculoskeletal:  Normal bulk and tone. Hand grip strength 5/5 bilaterally. No joint deformity or effusion appreciated. Mild synovial thickening of bilateral PIP & DIP joints. Neurological:  CN 2-12 grossly in tact. No meningismus. Moving all 4 extremities equally. Symmetric brachioradialis  deep tendon reflexes. Psychiatric:  Mood and affect congruent.  Speech normal rhythm, rate & tone.   PFT 10/20/13: FVC 2.14 L (86%) FEV1 1.98 L (92%) FEV1/FVC 0.82 FEF 25-75 2.14 L (104%) negative bronchodilator response TLC 3.73 L (83%) RV 70% ERV 65% DLCO uncorrected 92%  IMAGING CT CHEST W/O 03/28/15 (personally reviewed by me):  No parenchymal nodule or opacity appreciated. No pleural effusion or thickening. No pericardial effusion. No pathologic mediastinal adenopathy.  LABS 06/02/15 HGB:  15.3    Assessment & Plan:  65 y.o. female with history of asthma as well as chronic seasonal allergic rhinitis and reflux. Suspect patient's ongoing cough is due to exacerbation of her underlying asthma. I reviewed her previous CT scan of the chest from last fall which showed no parenchymal nodule or opacity. Also upon reviewing her previous pulmonary function testing from June 2015 there is no evidence of fixed airway obstruction or significant bronchodilator response. We did discuss potential for an immunodeficiency causing her frequent exacerbations as well as possible silent laryngo-esophageal reflux. Symptomatically she seems to be improving after restarting Breo and her third course of steroids. I instructed the patient to contact my office if she had any new breathing problems or questions before next appointment and informed her we would be holding off on serum blood work until I see her back and she is off steroids.  1. Asthma:  Patient finishing Prednisone Taper. Continuing Breo 100. Plan for Full PFTs at follow-up appointment. Also plan for CBC w/ differential & Quantitative Immunoglobulin Panel at next appointment.  2. Chronic Seasonal Allergic Rhinitis:  Continuing Flonase & Singulair. Plan for RAST Panel at next appointment.  3. GERD:  Continuing Protonix. Checking Esophagram/Barium Swallow. 4. Health Maintenance:  S/P Pneumovax 19 February 2013. Plan for Prevnar Vaccine at next appointment. Patient also aware she needs the Influenza Vaccine. 5. Follow-up:  Patient to return to clinic in 4 weeks or sooner if needed.  Sonia Baller Ashok Cordia, M.D. [facility] Pager:  [phone] After 3pm or if no response, call [phone] 5:08 PM 03/19/16

## 2016-03-26 ENCOUNTER — Ambulatory Visit (HOSPITAL_COMMUNITY): Admission: RE | Admit: 2016-03-26 | Payer: PRIVATE HEALTH INSURANCE | Source: Ambulatory Visit

## 2016-03-27 ENCOUNTER — Encounter: Payer: Self-pay | Admitting: Internal Medicine

## 2016-03-27 ENCOUNTER — Telehealth: Payer: Self-pay | Admitting: Pulmonary Disease

## 2016-03-27 ENCOUNTER — Ambulatory Visit (INDEPENDENT_AMBULATORY_CARE_PROVIDER_SITE_OTHER): Payer: PRIVATE HEALTH INSURANCE | Admitting: Internal Medicine

## 2016-03-27 VITALS — BP 130/70 | HR 72 | Ht 59.5 in | Wt 143.0 lb

## 2016-03-27 DIAGNOSIS — K602 Anal fissure, unspecified: Secondary | ICD-10-CM | POA: Diagnosis not present

## 2016-03-27 DIAGNOSIS — R197 Diarrhea, unspecified: Secondary | ICD-10-CM | POA: Diagnosis not present

## 2016-03-27 DIAGNOSIS — K625 Hemorrhage of anus and rectum: Secondary | ICD-10-CM | POA: Diagnosis not present

## 2016-03-27 DIAGNOSIS — R1310 Dysphagia, unspecified: Secondary | ICD-10-CM | POA: Diagnosis not present

## 2016-03-27 DIAGNOSIS — K219 Gastro-esophageal reflux disease without esophagitis: Secondary | ICD-10-CM

## 2016-03-27 DIAGNOSIS — Z800 Family history of malignant neoplasm of digestive organs: Secondary | ICD-10-CM

## 2016-03-27 NOTE — Patient Instructions (Signed)
You have been scheduled for an endoscopy and colonoscopy. Please follow the written instructions given to you at your visit today. Please pick up your prep supplies at the pharmacy within the next 1-3 days. If you use inhalers (even only as needed), please bring them with you on the day of your procedure. Your physician has requested that you go to www.startemmi.com and enter the access code given to you at your visit today. This web site gives a general overview about your procedure. However, you should still follow specific instructions given to you by our office regarding your preparation for the procedure.  Please use your Diltiazem gel twice a day.  Anal fissure handout given to read and follow.  I appreciate the opportunity to care for you. Gatha Mayer, M.D., [facility]

## 2016-03-27 NOTE — Progress Notes (Signed)
Brenda [name] 65 y.o. 1950-12-25 [id number]  Assessment & Plan:   Encounter Diagnoses  Name Primary?  Marland Kitchen Dysphagia, unspecified type Yes  . Diarrhea, unspecified type   . Rectal bleeding   . Anal fissure   . Family history of colon cancer - grandparent    Regarding the dysphagia, we'll schedule an upper GI endoscopy with possible dilation. She may indeed has some GERD, contributing to her asthma and cough, though I don't think she has classic symptoms of that any significant degree. She does have a globus sensation as well not listed above, and I think there is some anxiety which may be causing that though reflux or upper airway issues could be contributing also. Note that with respect to reflux and its relationship to asthma and upper airway symptoms, a pH probe with impedance testing could be used though she has not inclined to pursue that given the nature of the testing. I think that would be more useful than anything else with respect correlating in finding out if that relationship exists. A modified barium swallow could be useful as well to look for microaspiration etc.  The diarrhea, rectal bleeding and anal fissure/rectal pain symptoms will be evaluated with a colonoscopy. She is concerned because her father had colon polyps in her maternal grandmother had colon cancer. It has been 5 years since she's had a colonoscopy. She does have a long-standing history of an IBS diagnosis and tells me she had an evaluation for celiac disease that was negative years ago by Dr. Earlean Shawl. I do know that 1992 she had negative upper and lower GI biopsies by Dr. Timmothy Euler. She is on Benicar, which can cause a celiac like syndrome so duodenal biopsies would be useful I think.Medical treatment could be considered with ondansetron when necessary versus alosetron, pending the investigation. She is not a candidate for Viberzi. I don't know she's never been on bile salt sequestrant that is another possible option I  do suspect there is an underlying component of anxiety and she does admit to having some stress..  Regarding the anal fissure I recommended she use the diltiazem gel twice a day at least on a regular basis and not as needed.  The risks and benefits as well as alternatives of endoscopic procedure(s) have been discussed and reviewed. All questions answered. The patient agrees to proceed.  I appreciate the opportunity to care for this patient. CC: Tivis Ringer, MD Dr. Tera Partridge Dr. [name]  Subjective:   Chief Complaint: Rectal bleeding, diarrhea, cough, recurrent bronchitis and swallowing problems  HPI Patient is a very nice married urology nurse, with multiple GI complaints here for evaluation her own request. She is describing rare intermittent solid food dysphagia and a chronic tightness or lump in her throat or neck area. She is on a PPI, pantoprazole, which was started to see if it would help with her asthma symptoms, but apparently it has not helped per note of 03/19/2016. He did report some water brash type symptoms. Dr. Ashok Cordia was considering barium swallow but she reported she was coming to see me so held off.  She also has a long history of loose stools attributed to IBS, history of pancreatitis and sphincter of OD dysfunction many years ago seen at [facility]. She says she has seen Dr. Timmothy Euler and Dr. Earlean Shawl in the past, prior to seeing me, she was worked up for celiac disease in the past that was negative. She does reduce gluten in her diet to see  if that helps and has been using a probiotic and fiber which is bulked up her stool some. She has frequent loose stools with urgency. He says she has to know where the bathroom is. He does not seem to have urinary symptoms, her interstitial cystitis is under control I gas. She has chronic intermittent rectal bleeding. There is soreness and pain during and after defecation and she has been diagnosed with an anal fissure by Dr. Marcello Moores who  prescribed diltiazem gel though the patient has used that intermittently and not regularly. When she does use it she says it helps. She has a history of hemorrhoid surgery by Dr. Lennie Hummer in the past and says she's had rectal stenosis or stricture affect since that time. She also says she is prone to anal tags. She has undergone some pelvic floor physical therapy in the past though intermittently it seems, she has a difficult time doing that with her work schedule.  GI review of systems is otherwise negative. She does have a family history of colon cancer as mentioned in the assessment and plan above. In her father and uncle had ulcerative colitis.  She does admit that she has a stressful job, and that there is some stress in her life perhaps outside of that as well. She thinks that affects the way she feels. She has one caffeinated beverage daily. She relates that she is hoping to take care of some of her problems after she retires, i.e. she will have more time to pursue physical therapy etc. Allergies  Allergen Reactions  . Latex Rash  . Adhesive [Tape] Other (See Comments)    TEARS SKIN  . Clarithromycin Nausea Only and Other (See Comments)    TACHYCARDIA  . Codeine Other (See Comments)    SEVERE ELEVATED LIVER ENZYMES  . Cymbalta [Duloxetine Hcl] Other (See Comments)    Blurred vision  . Dilaudid [Hydromorphone Hcl] Itching  . Elavil [Amitriptyline Hcl] Other (See Comments)    Elevated liver enzymes  . Hydrocodone Other (See Comments)    Elevated liver enzymes  . Lactated Ringers Other (See Comments)    COULD CAUSE PANIC ATTACK  . Levbid [Hyoscyamine Sulfate] Other (See Comments)    Dizziness, nausea, headache  . Lisinopril Other (See Comments)    Dry cough  . Marcaine [Bupivacaine Hcl] Other (See Comments)    TACHYCARDIA, PRESYNCOPE  . Pentazocine Lactate Other (See Comments)    tachycardia  . Percodan [Oxycodone-Aspirin] Nausea Only  . Stadol [Butorphanol Tartrate] Swelling     Lips numb, tongue swell, tachycardia  . Trazodone And Nefazodone Other (See Comments)    tachycardia  . Vistaril [Hydroxyzine Hcl] Other (See Comments)    Altered sensorium  . Morphine And Related Itching and Rash   Outpatient Medications Prior to Visit  Medication Sig Dispense Refill  . albuterol (VENTOLIN HFA) 108 (90 Base) MCG/ACT inhaler Inhale into the lungs every 6 (six) hours as needed for wheezing or shortness of breath.    Marland Kitchen BIOTIN PO Take by mouth.    . Calcium Carbonate-Vitamin D (CALCIUM + D PO) Take by mouth daily. 1 tablet daily    . estradiol (VIVELLE-DOT) 0.1 MG/24HR Place 1 patch onto the skin 2 (two) times a week.    . ezetimibe (ZETIA) 10 MG tablet Take 10 mg by mouth daily.     . fish oil-omega-3 fatty acids 1000 MG capsule Take 1 g by mouth daily.     . fluticasone (FLONASE) 50 MCG/ACT nasal spray Place  2 sprays into the nose daily.    . folic acid (FOLVITE) 1 MG tablet Take 1 mg by mouth daily.    Marland Kitchen LORazepam (ATIVAN) 1 MG tablet 1/2 to 1 tab twice daily    . metoprolol (LOPRESSOR) 50 MG tablet Take 25 mg by mouth 2 (two) times daily.     . montelukast (SINGULAIR) 10 MG tablet Take 10 mg by mouth at bedtime.    . Multiple Vitamin (MULTIVITAMIN) tablet Take 1 tablet by mouth daily.    Marland Kitchen olmesartan (BENICAR) 20 MG tablet Take 20 mg by mouth daily.    . sertraline (ZOLOFT) 50 MG tablet Take 50 mg by mouth daily.    . Vitamin D, Ergocalciferol, (DRISDOL) 50000 UNITS CAPS Take 50,000 Units by mouth every 7 (seven) days.      Marland Kitchen b complex vitamins tablet Take 1 tablet by mouth daily.    . Cyanocobalamin (VITAMIN B-12) 1000 MCG SUBL Place under the tongue daily.    . meperidine (DEMEROL) 50 MG/ML injection Inject 1.5 mLs (75 mg total) into the muscle once. (Patient not taking: Reported on 03/19/2016) 1 mL 0  . promethazine (PHENERGAN) 25 MG/ML injection Inject 1 mL (25 mg total) into the muscle once. (Patient not taking: Reported on 03/19/2016) 1 mL 0  . Red Yeast Rice  Extract (RED YEAST RICE PO) Take by mouth.     No facility-administered medications prior to visit.    Past Medical History:  Diagnosis Date  . Allergic rhinitis   . Anal fissure   . Anemia   . Anxiety   . Arthritis   . Asthma   . Bladder pain   . Complication of anesthesia PAIN ATTACKS   LACTATED RINGERS- CONTRAINDICATED  . Depression   . Fibromyalgia   . Frequency of urination   . GERD (gastroesophageal reflux disease)   . Heart murmur   . History of pancreatitis 1988-1989  . Hyperlipidemia   . Hypertension LABILE  . IBS (irritable bowel syndrome)   . Interstitial cystitis   . Migraines   . Normal cardiac stress test JULY 2009---  NORMAL  . 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 Spasm of sphincter of Oddi 1987  . Urethral stenosis S/P DILATIONS  . Urgency of urination    Past Surgical History:  Procedure Laterality Date  . ABDOMINAL HYSTERECTOMY  1988  . APPENDECTOMY  04/ 1986  . bilateral reduction mastopexy  1988  . bilateral turbinate resection  1990   nasal spreading graft  . CHOLECYSTECTOMY  04/ 1986  . COLONOSCOPY  02/2004, 11/2010   2005: Normal - Dr. Sammuel Cooper 2012: Normal  . CYSTO WITH HYDRODISTENSION N/A 12/17/2013   Procedure: CYSTO HYDRODISTENSION WITH INSTALLATION OF CHLOROPACTIN AND TETRACAINE;  Surgeon: Malka So, MD;  Location: [facility];  Service: Urology;  Laterality: N/A;  . CYSTO WITH HYDRODISTENSION N/A 06/02/2015   Procedure: CYSTOSCOPY/HYDRODISTENSION WITH INSTILLATION OF CLORPACTIN ;  Surgeon: [name], MD;  Location: [facility];  Service: Urology;  Laterality: N/A;  . CYSTOSCOPY WITH URETHRAL DILATATION  07/12/2011   Procedure: CYSTOSCOPY WITH URETHRAL DILATATION;  Surgeon: Malka So, MD;  Location[facility];  Service: Urology;  Laterality: N/A;  HOD AND INSTILLATION OF CHLOROPACTIN C-ARM   . CYSTOSCOPY WITH  URETHRAL DILATATION N/A 06/26/2012   Procedure: CYSTOSCOPY WITH URETHRAL DILATATION HYDRODISTENSION AND INSTILLATION  OF CLORPACTION ;  Surgeon: Malka So, MD;  Location: [facility];  Service: Urology;  Laterality: N/A;  CYSTOSCOPY WITH URETHRAL DILATATION HYDRODISTENSION AND INSTILLATION OF CLORPACTION   . CYSTOSTOMY W/ BLADDER BIOPSY  1983  . DILATION AND CURETTAGE OF UTERUS  1987  . ERCP  1988   for pancreatitis with stents   S/P SEVERE PANCREATITIS  . HEMORRHOID SURGERY    . LASIK Bilateral APRIL 2005  . MULTIPLE CYSTO/ HOD/ URETHRAL DILATION/ INSTILLATION CLORPACTIN  .LAST ONE 03-31-2010  . NASAL SINUS SURGERY  1977   sinus cyst  . revision rhinoplasty  1978  . RHINOPLASTY  1977  . RIGHT SALPINGOOPHECTOMY  1998  . TONSILLECTOMY AND ADENOIDECTOMY  1963   Social History   Social History  . Marital status: Married    Spouse name: N/A  . Number of children: 0  . Years of education: N/A   Occupational History  . RN    Social History Main Topics  . Smoking status: Passive Smoke Exposure - Never Smoker  . Smokeless tobacco: Never Used     Comment: Father smoked briefly  . Alcohol use No  . Drug use: No  . Sexual activity: Not Asked   Other Topics Concern  . None   Social History Narrative   Married, lives with spouse   No children   RN at [employer]   No recent travel      [place] [facility]:   Originally from [place]. Always lived in [place]. Previously has traveled to [place], [place], [place], [place], & [place]. No indoor pets currently. She does have cats in her garage. No bird, mold, or hot tub exposure. No indoor plants. Mostly wood floors. She did have her bedroom carpet taken up in Summer 2017. Has mostly blinds. No wood burning fire place. Previously enjoyed Firefighter & playing piano.    Family History  Problem Relation Age of Onset  . Colon cancer Maternal Grandmother   . Colon cancer Cousin   . Allergies Mother   . Asthma Mother     . Heart disease Mother   . Osteoarthritis Mother   . Diabetes Mother   . Irritable bowel syndrome Mother   . Brain cancer Father   . Osteoarthritis Father   . Colon polyps Father     adenomatous  . Heart disease Father   . Melanoma Sister   . Osteoarthritis Sister   . Irritable bowel syndrome Sister   . Diabetes Sister   . Heart disease Sister   . Stroke Sister   . Ulcerative colitis Maternal Uncle   . Stomach cancer Neg Hx   . Rheumatologic disease Neg Hx      Review of Systems As per history of present illness. Off, some dyspnea fatigue, joint and back pain, anxiety, allergies. All other review of systems are negative.  Objective:   Physical Exam @BP  130/70 (BP Location: Left Arm, Patient Position: Sitting, Cuff Size: Normal)   Pulse 72   Ht 4' 11.5" (1.511 m) Comment: height measured without shoes  Wt 143 lb (64.9 kg)   BMI 28.40 kg/m @  General:  Well-developed, well-nourished and in no acute distress Eyes:  anicteric. ENT:   Mouth and posterior pharynx free of lesions.  Neck:   supple w/o thyromegaly or mass.  Lungs: Clear to auscultation bilaterally. Heart:  S1S2, no rubs, murmurs, gallops. Abdomen:  soft, non-tender, no hepatosplenomegaly, hernia, or mass and BS+.  Rectal: Patti Martinique CMA present  Fleshy anal tags are noted, the anoderm is otherwise normal, there is tenderness and anal stenosis. Tenderness is posterior. I can only insert my fifth digit part way it's very uncomfortable for her so I did not press further.  Lymph:  no cervical or supraclavicular adenopathy. Extremities:   no edema, cyanosis or clubbing Skin   no rash. Neuro:  A&O x 3.  Psych:  appropriate mood and  Affect.   Data Reviewed: Prior upper endoscopy, colonoscopy reports pathology reports on [facility] notes, labs in the EMR. Prior GI notes. CT 2016 no hiatal hernia reported  Several MRI abdomens in 2007 2008 with cystic lesions in the pancreas smaller reduced in size and were thought  to be benign and no further follow-up needed, she had 2 hepatic hemangiomas

## 2016-03-27 NOTE — Telephone Encounter (Signed)
DG esphoagus was cancelled by tech because pt was unable to go when the initial appt was scheduled. Will now need a new order placed. Order placed. Nothing further needed at this time.

## 2016-03-30 HISTORY — PX: DG BARIUM SWALLOW (ARMC HX): HXRAD1448

## 2016-04-05 ENCOUNTER — Institutional Professional Consult (permissible substitution): Payer: Self-pay | Admitting: Pulmonary Disease

## 2016-04-10 ENCOUNTER — Ambulatory Visit (AMBULATORY_SURGERY_CENTER): Payer: PRIVATE HEALTH INSURANCE | Admitting: Internal Medicine

## 2016-04-10 ENCOUNTER — Encounter: Payer: Self-pay | Admitting: Internal Medicine

## 2016-04-10 VITALS — BP 105/74 | HR 79 | Temp 98.6°F | Resp 13 | Ht 59.0 in | Wt 143.0 lb

## 2016-04-10 DIAGNOSIS — R1310 Dysphagia, unspecified: Secondary | ICD-10-CM

## 2016-04-10 DIAGNOSIS — K317 Polyp of stomach and duodenum: Secondary | ICD-10-CM | POA: Diagnosis not present

## 2016-04-10 DIAGNOSIS — D1803 Hemangioma of intra-abdominal structures: Secondary | ICD-10-CM | POA: Diagnosis not present

## 2016-04-10 DIAGNOSIS — R197 Diarrhea, unspecified: Secondary | ICD-10-CM | POA: Diagnosis not present

## 2016-04-10 MED ORDER — SODIUM CHLORIDE 0.9 % IV SOLN: 500.0000 mL | INTRAVENOUS | Status: DC

## 2016-04-10 NOTE — Op Note (Signed)
[place] Patient Name: Brenda [name] Procedure Date: 04/10/2016 12:59 PM MRN: [id number] Endoscopist: Gatha Mayer , MD Age: 65 Referring MD:  Date of Birth: [birth year] Gender: Female Account #: [id number] Procedure:                Colonoscopy Indications:              Clinically significant diarrhea of unexplained                            origin Medicines:                Propofol per Anesthesia, Monitored Anesthesia Care Procedure:                Pre-Anesthesia Assessment:                           - Prior to the procedure, a History and Physical                            was performed, and patient medications and                            allergies were reviewed. The patient's tolerance of                            previous anesthesia was also reviewed. The risks                            and benefits of the procedure and the sedation                            options and risks were discussed with the patient.                            All questions were answered, and informed consent                            was obtained. Prior Anticoagulants: The patient has                            taken no previous anticoagulant or antiplatelet                            agents. ASA Grade Assessment: II - A patient with                            mild systemic disease. After reviewing the risks                            and benefits, the patient was deemed in                            satisfactory condition to undergo the procedure.  After obtaining informed consent, the colonoscope                            was passed under direct vision. Throughout the                            procedure, the patient's blood pressure, pulse, and                            oxygen saturations were monitored continuously. The                            Model PCF-H190L [id number]) scope was introduced                            through the anus and advanced  to the the terminal                            ileum, with identification of the appendiceal                            orifice and IC valve. The colonoscopy was performed                            without difficulty. The patient tolerated the                            procedure well. The quality of the bowel                            preparation was excellent. The bowel preparation                            used was Miralax. The terminal ileum, the                            appendiceal orifice and the rectum were                            photographed. Scope In: 1:09:04 PM Scope Out: 1:20:12 PM Scope Withdrawal Time: 0 hours 7 minutes 8 seconds  Total Procedure Duration: 0 hours 11 minutes 8 seconds  Findings:                 The perianal exam findings include hypertrophied                            anal papilla(e) and skin tags.                           The digital rectal exam was normal.                           The terminal ileum appeared normal.  A few small-mouthed diverticula were found in the                            sigmoid colon.                           The exam was otherwise without abnormality on                            direct and retroflexion views.                           Biopsies for histology were taken with a cold                            forceps from the entire colon for evaluation of                            microscopic colitis. Estimated blood loss was                            minimal. Complications:            No immediate complications. Estimated Blood Loss:     Estimated blood loss was minimal. Impression:               - Hypertrophied anal papilla(e) and perianal skin                            tags found on perianal exam.                           - The examined portion of the ileum was normal.                           - Diverticulosis in the sigmoid colon.                           - The examination was  otherwise normal on direct                            and retroflexion views.                           - Biopsies were taken with a cold forceps from the                            entire colon for evaluation of microscopic colitis. Recommendation:           - Patient has a contact number available for                            emergencies. The signs and symptoms of potential                            delayed complications were discussed with the  patient. Return to normal activities tomorrow.                            Written discharge instructions were provided to the                            patient.                           - Resume previous diet.                           - Continue present medications.                           - Await pathology results.                           - Repeat colonoscopy is recommended. The                            colonoscopy date will be determined after pathology                            results from today's exam become available for                            review. Gatha Mayer, MD 04/10/2016 1:32:06 PM This report has been signed electronically.

## 2016-04-10 NOTE — Progress Notes (Signed)
To recovery VSS report to Production assistant, radio

## 2016-04-10 NOTE — Progress Notes (Signed)
Called to room to assist during endoscopic procedure.  Patient ID and intended procedure confirmed with present staff. Received instructions for my participation in the procedure from the performing physician.  

## 2016-04-10 NOTE — Patient Instructions (Addendum)
Nothing bad seen.   Some stomach polyps - tiny and benign but biopsied to be sure. Took small and large intestine biopsies to try to understand the diarrhea.  No polyps or cancer in colon.  It may be after Christmas for me to get the info to you as I am away next week.  I appreciate the opportunity to care for you. Gatha Mayer, MD, FACG   YOU HAD AN ENDOSCOPIC PROCEDURE TODAY AT [facility]:   Refer to the procedure report that was given to you for any specific questions about what was found during the examination.  If the procedure report does not answer your questions, please call your gastroenterologist to clarify.  If you requested that your care partner not be given the details of your procedure findings, then the procedure report has been included in a sealed envelope for you to review at your convenience later.  YOU SHOULD EXPECT: Some feelings of bloating in the abdomen. Passage of more gas than usual.  Walking can help get rid of the air that was put into your GI tract during the procedure and reduce the bloating. If you had a lower endoscopy (such as a colonoscopy or flexible sigmoidoscopy) you may notice spotting of blood in your stool or on the toilet paper. If you underwent a bowel prep for your procedure, you may not have a normal bowel movement for a few days.  Please Note:  You might notice some irritation and congestion in your nose or some drainage.  This is from the oxygen used during your procedure.  There is no need for concern and it should clear up in a day or so.  SYMPTOMS TO REPORT IMMEDIATELY:   Following lower endoscopy (colonoscopy or flexible sigmoidoscopy):  Excessive amounts of blood in the stool  Significant tenderness or worsening of abdominal pains  Swelling of the abdomen that is new, acute  Fever of 100F or higher   Following upper endoscopy (EGD)  Vomiting of blood or coffee ground material  New chest pain or pain  under the shoulder blades  Painful or persistently difficult swallowing  New shortness of breath  Fever of 100F or higher  Black, tarry-looking stools  For urgent or emergent issues, a gastroenterologist can be reached at any hour by calling [phone]. Please read all handouts given to you by your recovery nurse today.  DIET:  We do recommend a small meal at first, but then you may proceed to your regular diet.  Drink plenty of fluids but you should avoid alcoholic beverages for 24 hours.  ACTIVITY:  You should plan to take it easy for the rest of today and you should NOT DRIVE or use heavy machinery until tomorrow (because of the sedation medicines used during the test).    FOLLOW UP: Our staff will call the number listed on your records the next business day following your procedure to check on you and address any questions or concerns that you may have regarding the information given to you following your procedure. If we do not reach you, we will leave a message.  However, if you are feeling well and you are not experiencing any problems, there is no need to return our call.  We will assume that you have returned to your regular daily activities without incident.  If any biopsies were taken you will be contacted by phone or by letter within the next 1-3 weeks.  Please call us at [phone]  [phone] if you have not heard about the biopsies in 3 weeks.    SIGNATURES/CONFIDENTIALITY: You and/or your care partner have signed paperwork which will be entered into your electronic medical record.  These signatures attest to the fact that that the information above on your After Visit Summary has been reviewed and is understood.  Full responsibility of the confidentiality of this discharge information lies with you and/or your care-partner.  Thank you for letting us take care of your healthcare needs today.

## 2016-04-10 NOTE — Op Note (Signed)
[place] Patient Name: Brenda [name] Procedure Date: 04/10/2016 12:59 PM MRN: [id number] Endoscopist: Gatha Mayer , MD Age: 65 Referring MD:  Date of Birth: 1950/11/04 Gender: Female Account #: [id number] Procedure:                Upper GI endoscopy Indications:              Dysphagia, Diarrhea Medicines:                Propofol per Anesthesia, Monitored Anesthesia Care Procedure:                Pre-Anesthesia Assessment:                           - Prior to the procedure, a History and Physical                            was performed, and patient medications and                            allergies were reviewed. The patient's tolerance of                            previous anesthesia was also reviewed. The risks                            and benefits of the procedure and the sedation                            options and risks were discussed with the patient.                            All questions were answered, and informed consent                            was obtained. Prior Anticoagulants: The patient has                            taken no previous anticoagulant or antiplatelet                            agents. ASA Grade Assessment: II - A patient with                            mild systemic disease. After reviewing the risks                            and benefits, the patient was deemed in                            satisfactory condition to undergo the procedure.                           After obtaining informed consent, the endoscope was  passed under direct vision. Throughout the                            procedure, the patient's blood pressure, pulse, and                            oxygen saturations were monitored continuously. The                            Model GIF-[id number]) scope was introduced                            through the mouth, and advanced to the second part                            of  duodenum. The upper GI endoscopy was                            accomplished without difficulty. The patient                            tolerated the procedure well. Scope In: Scope Out: Findings:                 A few 2 to 5 mm sessile polyps with no stigmata of                            recent bleeding were found in the gastric fundus.                            Biopsies were taken with a cold forceps for                            histology. Estimated blood loss was minimal.                           The exam was otherwise without abnormality.                           Biopsies were taken with a cold forceps in the                            first portion of the duodenum and in the second                            portion of the duodenum for histology. Verification                            of patient identification for the specimen was                            done. Estimated blood loss was minimal.  The cardia and gastric fundus were otherwise normal                            on retroflexion. Complications:            No immediate complications. Estimated Blood Loss:     Estimated blood loss was minimal. Impression:               - A few gastric polyps. Biopsied.                           - The examination was otherwise normal.                           - Biopsies were taken with a cold forceps for                            histology in the first portion of the duodenum and                            in the second portion of the duodenum. ? Olmesartan                            enteropathy Recommendation:           - Patient has a contact number available for                            emergencies. The signs and symptoms of potential                            delayed complications were discussed with the                            patient. Return to normal activities tomorrow.                            Written discharge instructions were provided to  the                            patient.                           - Resume previous diet.                           - Continue present medications.                           - Await pathology results.                           - See the other procedure note for documentation of                            additional recommendations. Gatha Mayer, MD 04/10/2016 1:29:11 PM This report has been signed  electronically.

## 2016-04-11 ENCOUNTER — Telehealth: Payer: Self-pay | Admitting: *Deleted

## 2016-04-11 NOTE — Telephone Encounter (Signed)
  Follow up Call-  Call back number 04/10/2016  Post procedure Call Back phone  # [phone]  Permission to leave phone message Yes  Some recent data might be hidden     Patient questions:  Do you have a fever, pain , or abdominal swelling? No. Pain Score  0 *  Have you tolerated food without any problems? Yes.    Have you been able to return to your normal activities? Yes.    Do you have any questions about your discharge instructions: Diet   No. Medications  No. Follow up visit  No.  Do you have questions or concerns about your Care? No.  Actions: * If pain score is 4 or above: No action needed, pain <4.

## 2016-04-13 ENCOUNTER — Encounter: Payer: Self-pay | Admitting: Pulmonary Disease

## 2016-04-16 ENCOUNTER — Encounter: Payer: Self-pay | Admitting: Acute Care

## 2016-04-16 ENCOUNTER — Ambulatory Visit (HOSPITAL_COMMUNITY): Payer: PRIVATE HEALTH INSURANCE

## 2016-04-16 ENCOUNTER — Ambulatory Visit (HOSPITAL_COMMUNITY)
Admission: RE | Admit: 2016-04-16 | Discharge: 2016-04-16 | Disposition: A | Discharge status: Home / Self Care | Payer: PRIVATE HEALTH INSURANCE | Source: Ambulatory Visit | Attending: Pulmonary Disease | Admitting: Pulmonary Disease

## 2016-04-16 DIAGNOSIS — K219 Gastro-esophageal reflux disease without esophagitis: Secondary | ICD-10-CM | POA: Insufficient documentation

## 2016-04-17 ENCOUNTER — Other Ambulatory Visit: Payer: Self-pay

## 2016-04-17 MED ORDER — RANITIDINE HCL 150 MG PO TABS: 150.0000 mg | ORAL_TABLET | Freq: Every day | ORAL | 3 refills | Status: DC

## 2016-04-18 ENCOUNTER — Ambulatory Visit (INDEPENDENT_AMBULATORY_CARE_PROVIDER_SITE_OTHER)
Admission: RE | Admit: 2016-04-18 | Discharge: 2016-04-18 | Disposition: A | Discharge status: Home / Self Care | Payer: PRIVATE HEALTH INSURANCE | Source: Ambulatory Visit | Attending: Pulmonary Disease | Admitting: Pulmonary Disease

## 2016-04-18 ENCOUNTER — Telehealth: Payer: Self-pay | Admitting: Pulmonary Disease

## 2016-04-18 ENCOUNTER — Ambulatory Visit (HOSPITAL_COMMUNITY)
Admission: RE | Admit: 2016-04-18 | Discharge: 2016-04-18 | Disposition: A | Discharge status: Home / Self Care | Payer: PRIVATE HEALTH INSURANCE | Source: Ambulatory Visit | Attending: Pulmonary Disease | Admitting: Pulmonary Disease

## 2016-04-18 ENCOUNTER — Other Ambulatory Visit: Payer: Self-pay | Admitting: Pulmonary Disease

## 2016-04-18 ENCOUNTER — Other Ambulatory Visit (INDEPENDENT_AMBULATORY_CARE_PROVIDER_SITE_OTHER): Payer: PRIVATE HEALTH INSURANCE

## 2016-04-18 ENCOUNTER — Encounter: Payer: Self-pay | Admitting: Pulmonary Disease

## 2016-04-18 ENCOUNTER — Ambulatory Visit (INDEPENDENT_AMBULATORY_CARE_PROVIDER_SITE_OTHER): Payer: PRIVATE HEALTH INSURANCE | Admitting: Pulmonary Disease

## 2016-04-18 VITALS — BP 134/78 | HR 87 | Ht 59.5 in | Wt 145.0 lb

## 2016-04-18 DIAGNOSIS — J302 Other seasonal allergic rhinitis: Secondary | ICD-10-CM

## 2016-04-18 DIAGNOSIS — J0100 Acute maxillary sinusitis, unspecified: Secondary | ICD-10-CM | POA: Diagnosis not present

## 2016-04-18 DIAGNOSIS — J45909 Unspecified asthma, uncomplicated: Secondary | ICD-10-CM

## 2016-04-18 DIAGNOSIS — K219 Gastro-esophageal reflux disease without esophagitis: Secondary | ICD-10-CM | POA: Diagnosis not present

## 2016-04-18 LAB — PULMONARY FUNCTION TEST
DL/VA % pred: 92 %
DL/VA: 3.93 ml/min/mmHg/L
DLCO unc % pred: 67 %
DLCO unc: 12.76 ml/min/mmHg
FEF 25-75 Post: 3.56 L/sec
FEF 25-75 Pre: 3.03 L/sec
FEF2575-%Change-Post: 17 %
FEF2575-%Pred-Post: 183 %
FEF2575-%Pred-Pre: 156 %
FEV1-%Change-Post: 3 %
FEV1-%Pred-Post: 91 %
FEV1-%Pred-Pre: 88 %
FEV1-Post: 1.89 L
FEV1-Pre: 1.84 L
FEV1FVC-%Change-Post: 3 %
FEV1FVC-%Pred-Pre: 115 %
FEV6-%Change-Post: 0 %
FEV6-%Pred-Post: 78 %
FEV6-%Pred-Pre: 79 %
FEV6-Post: 2.05 L
FEV6-Pre: 2.06 L
FEV6FVC-%Pred-Post: 104 %
FEV6FVC-%Pred-Pre: 104 %
FVC-%Change-Post: 0 %
FVC-%Pred-Post: 76 %
FVC-%Pred-Pre: 76 %
FVC-Post: 2.06 L
FVC-Pre: 2.06 L
Post FEV1/FVC ratio: 92 %
Post FEV6/FVC ratio: 100 %
Pre FEV1/FVC ratio: 89 %
Pre FEV6/FVC Ratio: 100 %
RV % pred: 139 %
RV: 2.61 L
TLC % pred: 108 %
TLC: 4.83 L

## 2016-04-18 LAB — CBC WITH DIFFERENTIAL/PLATELET
Basophils Absolute: 0.1 10*3/uL (ref 0.0–0.1)
Basophils Relative: 0.9 % (ref 0.0–3.0)
Eosinophils Absolute: 0.1 10*3/uL (ref 0.0–0.7)
Eosinophils Relative: 1.1 % (ref 0.0–5.0)
HCT: 40 % (ref 36.0–46.0)
Hemoglobin: 13.8 g/dL (ref 12.0–15.0)
Lymphocytes Relative: 15.8 % (ref 12.0–46.0)
Lymphs Abs: 1.6 10*3/uL (ref 0.7–4.0)
MCHC: 34.5 g/dL (ref 30.0–36.0)
MCV: 93.6 fl (ref 78.0–100.0)
Monocytes Absolute: 0.6 10*3/uL (ref 0.1–1.0)
Monocytes Relative: 5.9 % (ref 3.0–12.0)
Neutro Abs: 7.5 10*3/uL (ref 1.4–7.7)
Neutrophils Relative %: 76.3 % (ref 43.0–77.0)
Platelets: 431 10*3/uL — ABNORMAL HIGH (ref 150.0–400.0)
RBC: 4.28 Mil/uL (ref 3.87–5.11)
RDW: 13.6 % (ref 11.5–15.5)
WBC: 9.9 10*3/uL (ref 4.0–10.5)

## 2016-04-18 MED ORDER — METHYLPREDNISOLONE ACETATE 80 MG/ML IJ SUSP
80.0000 mg | Freq: Once | INTRAMUSCULAR | Status: AC
  Administered 2016-04-18: 80 mg via INTRAMUSCULAR

## 2016-04-18 MED ORDER — ALBUTEROL SULFATE (2.5 MG/3ML) 0.083% IN NEBU
2.5000 mg | INHALATION_SOLUTION | Freq: Once | RESPIRATORY_TRACT | Status: AC
  Administered 2016-04-18: 2.5 mg via RESPIRATORY_TRACT

## 2016-04-18 MED ORDER — AMOXICILLIN-POT CLAVULANATE 875-125 MG PO TABS: 1.0000 | ORAL_TABLET | Freq: Two times a day (BID) | ORAL | 0 refills | Status: DC

## 2016-04-18 NOTE — Patient Instructions (Addendum)
   Continue using your Breo inhaler.  Stop using your Flonase nasal spray while you are sick.  Remember to take the Augmentin with food and call me if you have any problems with the medication.  I will see you back in a couple of weeks.   TESTS ORDERED: 1. Serum CBC with differential, Quantitative Immunoglobulin Panel & RAST Panel today. 2. Sputum culture for AFB, Fungus, & Bacteria 3. Maxillofacial Sinus CT LTD w/o Contrast ASAP 4. CXR PA/LAT today

## 2016-04-18 NOTE — Progress Notes (Signed)
Subjective:    Patient ID: Brenda [name], female    DOB: 06/04/1950, 65 y.o.   MRN: [id number]  C.C.:  Follow-up for Asthma, GERD, & Chronic Seasonal Allergic Rhinitis.   HPI Asthma: Currently on Breo. Recently patient is still feeling more short of breath. She reports a new cough producing a yellow mucus. Mucus is occasionally green.   GERD: Patient on Protonix. Seen recently by Dr. Carlean Purl GI and underwent EGD. Reflux noted on esophagram but patient hasn't started Zantac yet. She denies any reflux or dyspepsia that she is feeling.   Chronic Seasonal Allergic Rhinitis: Patient on Singulair and Flonase. She reports she has been noticing a bloody and yellow nasal discharge that is continued. She reports she has noticed scabs in her nose that are coming out as well. She has continued sinus congestion & pressure.   Review of Systems She has had some chest pressure and pain with her coughing. She reports her chest doesn't feel as tight. No fever or chills. Does have occasional night sweats. She reports significant fatigue.   Allergies  Allergen Reactions  . Latex Rash  . Adhesive [Tape] Other (See Comments)    TEARS SKIN  . Bupivacaine Other (See Comments)    Other Reaction: PRE-SYNCOPE, TACTYCARDIA TACHYCARDIA/ PRESYNCOPE  . Butorphanol Tartrate Other (See Comments)    Other Reaction: NUMBNESS, TACHYCARDIA  . Clarithromycin Nausea Only and Other (See Comments)    TACHYCARDIA  . Codeine Other (See Comments)    SEVERE ELEVATED LIVER ENZYMES  . Cymbalta [Duloxetine Hcl] Other (See Comments)    Blurred vision  . Dilaudid [Hydromorphone Hcl] Itching  . Elavil [Amitriptyline Hcl] Other (See Comments)    Elevated liver enzymes  . Hydrocodone Other (See Comments)    Elevated liver enzymes  . Hyoscyamine Sulfate Other (See Comments)  . Lactated Ringers Other (See Comments)    COULD CAUSE PANIC ATTACK  . Levbid [Hyoscyamine Sulfate] Other (See Comments)    Dizziness, nausea, headache    . Lisinopril Other (See Comments)    Dry cough  . Marcaine [Bupivacaine Hcl] Other (See Comments)    TACHYCARDIA, PRESYNCOPE  . Pentazocine Lactate Other (See Comments)    tachycardia  . Percodan [Oxycodone-Aspirin] Nausea Only  . Stadol [Butorphanol Tartrate] Swelling    Lips numb, tongue swell, tachycardia  . Trazodone And Nefazodone Other (See Comments)    tachycardia  . Vistaril [Hydroxyzine Hcl] Other (See Comments)    Altered sensorium  . Morphine And Related Itching and Rash    Current Outpatient Prescriptions on File Prior to Visit  Medication Sig Dispense Refill  . albuterol (VENTOLIN HFA) 108 (90 Base) MCG/ACT inhaler Inhale into the lungs every 6 (six) hours as needed for wheezing or shortness of breath.    Marland Kitchen BIOTIN PO Take by mouth.    . Calcium Carbonate-Vitamin D (CALCIUM + D PO) Take by mouth daily. 1 tablet daily    . estradiol (VIVELLE-DOT) 0.1 MG/24HR Place 1 patch onto the skin 2 (two) times a week.    . ezetimibe (ZETIA) 10 MG tablet Take 10 mg by mouth daily.     . fish oil-omega-3 fatty acids 1000 MG capsule Take 1 g by mouth daily.     . fluticasone (FLONASE) 50 MCG/ACT nasal spray Place 2 sprays into the nose daily.    . Fluticasone Furoate-Vilanterol (BREO ELLIPTA IN) Inhale 1 puff into the lungs daily.    . folic acid (FOLVITE) 1 MG tablet Take 1 mg by mouth  daily.    Marland Kitchen LORazepam (ATIVAN) 1 MG tablet 1/2 to 1 tab twice daily    . metoprolol (LOPRESSOR) 50 MG tablet Take 25 mg by mouth 2 (two) times daily.     . montelukast (SINGULAIR) 10 MG tablet Take 10 mg by mouth at bedtime.    . Multiple Vitamin (MULTIVITAMIN) tablet Take 1 tablet by mouth daily.    Marland Kitchen olmesartan (BENICAR) 20 MG tablet Take 20 mg by mouth daily.    . pantoprazole (PROTONIX) 40 MG tablet Take 40 mg by mouth daily.    . sertraline (ZOLOFT) 50 MG tablet Take 50 mg by mouth daily.    . Vitamin D, Ergocalciferol, (DRISDOL) 50000 UNITS CAPS Take 50,000 Units by mouth every 7 (seven) days.       . ranitidine (ZANTAC) 150 MG tablet Take 1 tablet (150 mg total) by mouth at bedtime. (Patient not taking: Reported on 04/18/2016) 30 tablet 3   Current Facility-Administered Medications on File Prior to Visit  Medication Dose Route Frequency Provider Last Rate Last Dose  . 0.9 %  sodium chloride infusion  500 mL Intravenous Continuous Gatha Mayer, MD        Past Medical History:  Diagnosis Date  . Allergic rhinitis   . Allergy   . Anal fissure   . Anemia   . Anxiety   . Arthritis   . Asthma   . Bladder pain   . Complication of anesthesia PAIN ATTACKS   LACTATED RINGERS- CONTRAINDICATED  . Depression   . Fibromyalgia   . Frequency of urination   . GERD (gastroesophageal reflux disease)   . Heart murmur   . History of pancreatitis 1988-1989  . Hyperlipidemia   . Hypertension LABILE  . IBS (irritable bowel syndrome)   . Interstitial cystitis   . Migraines   . Normal cardiac stress test JULY 2009---  NORMAL  . 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 Spasm of sphincter of Oddi 1987  . Stroke ([place])   . Urethral stenosis S/P DILATIONS  . Urgency of urination     Past Surgical History:  Procedure Laterality Date  . ABDOMINAL HYSTERECTOMY  1988  . APPENDECTOMY  04/ 1986  . bilateral reduction mastopexy  1988  . bilateral turbinate resection  1990   nasal spreading graft  . CATARACT EXTRACTION, BILATERAL    . CHOLECYSTECTOMY  04/ 1986  . COLONOSCOPY  02/2004, 11/2010   2005: Normal - Dr. Sammuel Cooper 2012: Normal  . CYSTO WITH HYDRODISTENSION N/A 12/17/2013   Procedure: CYSTO HYDRODISTENSION WITH INSTALLATION OF CHLOROPACTIN AND TETRACAINE;  Surgeon: Malka So, MD;  Location: [facility];  Service: Urology;  Laterality: N/A;  . CYSTO WITH HYDRODISTENSION N/A 06/02/2015   Procedure: CYSTOSCOPY/HYDRODISTENSION WITH INSTILLATION OF CLORPACTIN ;  Surgeon: [name], MD;  Location:  [facility];  Service: Urology;  Laterality: N/A;  . CYSTOSCOPY WITH URETHRAL DILATATION  07/12/2011   Procedure: CYSTOSCOPY WITH URETHRAL DILATATION;  Surgeon: Malka So, MD;  Location: [facility];  Service: Urology;  Laterality: N/A;  HOD AND INSTILLATION OF CHLOROPACTIN C-ARM   . CYSTOSCOPY WITH URETHRAL DILATATION N/A 06/26/2012   Procedure: CYSTOSCOPY WITH URETHRAL DILATATION HYDRODISTENSION AND INSTILLATION OF CLORPACTION ;  Surgeon: Malka So, MD;  Location: [facility];  Service: Urology;  Laterality: N/A;  CYSTOSCOPY WITH URETHRAL DILATATION  HYDRODISTENSION AND INSTILLATION OF CLORPACTION   . CYSTOSTOMY W/ BLADDER BIOPSY  1983  . DILATION AND CURETTAGE OF UTERUS  1987  . ERCP  1988   for pancreatitis with stents   S/P SEVERE PANCREATITIS  . HEMORRHOID SURGERY    . LASIK Bilateral APRIL 2005  . MULTIPLE CYSTO/ HOD/ URETHRAL DILATION/ INSTILLATION CLORPACTIN  .LAST ONE 03-31-2010  . NASAL SINUS SURGERY  1977   sinus cyst  . revision rhinoplasty  1978  . RHINOPLASTY  1977  . RIGHT SALPINGOOPHECTOMY  1998  . TONSILLECTOMY AND ADENOIDECTOMY  1963    Family History  Problem Relation Age of Onset  . Colon cancer Maternal Grandmother   . Colon cancer Cousin   . Allergies Mother   . Asthma Mother   . Heart disease Mother   . Osteoarthritis Mother   . Diabetes Mother   . Irritable bowel syndrome Mother   . Brain cancer Father   . Osteoarthritis Father   . Colon polyps Father     adenomatous  . Heart disease Father   . Melanoma Sister   . Osteoarthritis Sister   . Irritable bowel syndrome Sister   . Diabetes Sister   . Heart disease Sister   . Stroke Sister   . Ulcerative colitis Maternal Uncle   . Stomach cancer Neg Hx   . Rheumatologic disease Neg Hx     Social History   Social History  . Marital status: Married    Spouse name: N/A  . Number of children: 0  . Years of education: N/A   Occupational History  . RN     Social History Main Topics  . Smoking status: Passive Smoke Exposure - Never Smoker  . Smokeless tobacco: Never Used     Comment: Father smoked briefly  . Alcohol use No  . Drug use: No  . Sexual activity: Not Asked   Other Topics Concern  . None   Social History Narrative   Married, lives with spouse   No children   RN at [employer]   No recent travel      [facility]:   Originally from [place]. Always lived in [place]. Previously has traveled to [place], [place], [place], [place], & mostly [place]. No indoor pets currently. She does have cats in her garage. No bird, mold, or hot tub exposure. No indoor plants. Mostly wood floors. She did have her bedroom carpet taken up in Summer 2017. Has mostly blinds. No wood burning fire place. Previously enjoyed Firefighter & playing piano.       Objective:   Physical Exam BP 134/78 (BP Location: Right Arm, Cuff Size: Normal)   Pulse 87   Ht 4' 11.5" (1.511 m)   Wt 145 lb (65.8 kg)   SpO2 98%   BMI 28.80 kg/m  General:  Awake. No distress. Comfortable.  Integument:  Warm & dry. No rash on exposed skin.  Lymphatics:  No appreciated cervical or supraclavicular lymphadenoapthy. HEENT:  Continued mild bilateral nasal turbinate swelling. Mild bilateral maxillary sinus tenderness to palpation. No scleral icterus or injection. Cardiovascular:  Regular rate. No edema. Normal S1 & S2. Pulmonary:  Clear with auscultation bilaterally. Normal work of breathing on room air. Speaking in complete sentences. Abdomen: Soft. Normal bowel sounds. Nondistended.  PFT 04/18/16: FVC 2.06 L (76%) FEV1 1.84 L (80%) FEV1/FVC 0.89 FEF 25-75 3.03 L (156%) negative bronchodilator response TLC 43 L (108%) RV 139% ERV 25% DLCO uncorrected 67% 10/20/13: FVC 2.14 L (86%)  FEV1 1.98 L (92%) FEV1/FVC 0.82 FEF 25-75 2.14 L (104%) negative bronchodilator response TLC 3.73 L (83%) RV 70% ERV 65% DLCO uncorrected 92%  IMAGING BARIUM SWALLOW/ESOPHAGRAM 04/16/16  (per radiologist):  Mild gastroesophageal reflux. No stricture.   CT CHEST W/O 03/28/15 (previously reviewed by me):  No parenchymal nodule or opacity appreciated. No pleural effusion or thickening. No pericardial effusion. No pathologic mediastinal adenopathy.  LABS 06/02/15 HGB:  15.3    Assessment & Plan:  65 y.o. female with asthma, GERD, & chronic seasonal allergic rhinitis. Patient appears to have acute maxillary sinusitis today. Her spirometry remained stable without a significant bronchodilator response therefore I do not feel that her asthma is contributing to her cough. With her recurrent infections I do question whether or not she could have an immunoglobulin deficiency and this will need to be assessed. I am going to try the patient on empiric antibiotics while obtaining further cultures as well as sinus imaging to further guide treatment. I instructed the patient to contact my office if she had any new breathing problems or questions before next appointment.  1. Acute Maxillary Sinusitis:  Administering Depo-Medrol 80mg  IM x1. Starting Augmentin 875mg  bid x10 days. Checking sputum culture for AFB, Fungus, & Bacteria. Checking maxillofacial CT scan without contrast. Checking quantitative immunoglobulin panel. 2. Asthma:  Continuing Breo & Singulair. No changes at this time. 3. Chronic Seasonal Allergic Rhinitis:  Continuing Singulair. Stopping Flonase nasal spray. Checking RAST panel & CBC with differential. 4. GERD:  Continuing Protonix. Advised patient to obtain and start Zantac prescription. 5. Health Maintenance:  S/P Pneumovax 19 February 2013. Holding off on Prevnar and influenza vaccines today. 6. Follow-up: Patient to return to clinic in 2 weeks or sooner if needed.   Sonia Baller Ashok Cordia, M.D. [facility] Pulmonary [facility] Critical Care Pager:  [phone] After 3pm or if no response, call [phone] 3:22 PM 04/18/16

## 2016-04-18 NOTE — Telephone Encounter (Signed)
JN- is there any reason we can not schedule the 2 wk rov with NP? Please advise thanks

## 2016-04-18 NOTE — Telephone Encounter (Signed)
IMAGING BARIUM SWALLOW/ESOPHAGRAM 04/16/16 (per radiologist):  Mild gastroesophageal reflux. No stricture.

## 2016-04-19 ENCOUNTER — Other Ambulatory Visit: Payer: PRIVATE HEALTH INSURANCE

## 2016-04-19 ENCOUNTER — Ambulatory Visit (INDEPENDENT_AMBULATORY_CARE_PROVIDER_SITE_OTHER)
Admission: RE | Admit: 2016-04-19 | Discharge: 2016-04-19 | Disposition: A | Discharge status: Home / Self Care | Payer: PRIVATE HEALTH INSURANCE | Source: Ambulatory Visit | Attending: Pulmonary Disease | Admitting: Pulmonary Disease

## 2016-04-19 DIAGNOSIS — J0100 Acute maxillary sinusitis, unspecified: Secondary | ICD-10-CM

## 2016-04-19 DIAGNOSIS — J45909 Unspecified asthma, uncomplicated: Secondary | ICD-10-CM

## 2016-04-19 LAB — IGG, IGA, IGM
IgA: 139 mg/dL (ref 81–463)
IgG (Immunoglobin G), Serum: 587 mg/dL — ABNORMAL LOW (ref 694–1618)
IgM, Serum: 129 mg/dL (ref 48–271)

## 2016-04-19 LAB — RESPIRATORY ALLERGY PROFILE REGION II ~~LOC~~
Allergen, A. alternata, m6: <0.1 kU/L
Allergen, C. Herbarum, M2: <0.1 kU/L
Allergen, Cedar tree, t12: <0.1 kU/L
Allergen, Comm Silver Birch, t9: <0.1 kU/L
Allergen, Cottonwood, t14: <0.1 kU/L
Allergen, D pternoyssinus,d7: <0.1 kU/L
Allergen, Mouse Urine Protein, e78: <0.1 kU/L
Allergen, Mulberry, t76: <0.1 kU/L
Allergen, Oak,t7: <0.1 kU/L
Allergen, P. notatum, m1: <0.1 kU/L
Aspergillus fumigatus, m3: <0.1 kU/L
Bermuda Grass: <0.1 kU/L
Box Elder IgE: <0.1 kU/L
Cat Dander: <0.1 kU/L
Cockroach: <0.1 kU/L
Common Ragweed: <0.1 kU/L
D. farinae: <0.1 kU/L
Dog Dander: <0.1 kU/L
Elm IgE: <0.1 kU/L
IgE (Immunoglobulin E), Serum: <2 kU/L (ref <115)
Johnson Grass: <0.1 kU/L
Pecan/Hickory Tree IgE: <0.1 kU/L
Rough Pigweed  IgE: <0.1 kU/L
Sheep Sorrel IgE: <0.1 kU/L
Timothy Grass: <0.1 kU/L

## 2016-04-19 NOTE — Telephone Encounter (Signed)
lmom tcb x1 Below is JN message   Javier Glazier, MD 11 minutes ago (10:12 AM)    2 week can be with NP.    Documentation

## 2016-04-19 NOTE — Telephone Encounter (Signed)
2 week can be with NP.

## 2016-04-20 NOTE — Telephone Encounter (Signed)
Pt scheduled with SG on 05/03/16. Nothing further needed.

## 2016-04-20 NOTE — Telephone Encounter (Signed)
lmtcb X 2 

## 2016-04-22 LAB — RESPIRATORY CULTURE OR RESPIRATORY AND SPUTUM CULTURE: Organism ID, Bacteria: NORMAL

## 2016-04-25 NOTE — Progress Notes (Signed)
Duodenal and colon bxs NL Gastric polyps benign fundic gland polyp  No EGD recall  Colon recall 03/2026  My Chart message - no letter

## 2016-04-26 ENCOUNTER — Telehealth: Payer: Self-pay | Admitting: Pulmonary Disease

## 2016-04-26 NOTE — Telephone Encounter (Signed)
Notes Recorded by Javier Glazier, MD on 04/19/2016 at 10:12 AM EST Please let the patient know that I reviewed her chest x-ray. There is no evidence of any pneumonia. Also informed her that her sinus CT scan does not show any bony destruction or fluid collections. She should continue on her current treatment plan unless her culture shows something different in which case we will call her. Thank you.   Called and spoke with pt and she is aware of results per JN.Marland Kitchen

## 2016-05-01 ENCOUNTER — Telehealth: Payer: Self-pay | Admitting: Internal Medicine

## 2016-05-01 NOTE — Telephone Encounter (Signed)
Says Dr Carlean Purl advised her to leave a phone number that he can call her back to. [phone] or work # [phone]

## 2016-05-03 ENCOUNTER — Ambulatory Visit (INDEPENDENT_AMBULATORY_CARE_PROVIDER_SITE_OTHER): Payer: PRIVATE HEALTH INSURANCE | Admitting: Acute Care

## 2016-05-03 ENCOUNTER — Encounter: Payer: Self-pay | Admitting: Acute Care

## 2016-05-03 VITALS — BP 100/64 | HR 69 | Ht 59.5 in | Wt 142.2 lb

## 2016-05-03 DIAGNOSIS — K219 Gastro-esophageal reflux disease without esophagitis: Secondary | ICD-10-CM

## 2016-05-03 DIAGNOSIS — J302 Other seasonal allergic rhinitis: Secondary | ICD-10-CM

## 2016-05-03 DIAGNOSIS — J40 Bronchitis, not specified as acute or chronic: Secondary | ICD-10-CM

## 2016-05-03 DIAGNOSIS — J0100 Acute maxillary sinusitis, unspecified: Secondary | ICD-10-CM

## 2016-05-03 DIAGNOSIS — J45909 Unspecified asthma, uncomplicated: Secondary | ICD-10-CM | POA: Diagnosis not present

## 2016-05-03 MED ORDER — PREDNISONE 10 MG PO TABS: ORAL_TABLET | ORAL | 0 refills | Status: DC

## 2016-05-03 MED ORDER — METHYLPREDNISOLONE ACETATE 80 MG/ML IJ SUSP
80.0000 mg | Freq: Once | INTRAMUSCULAR | Status: AC
  Administered 2016-05-03: 80 mg via INTRAMUSCULAR

## 2016-05-03 NOTE — Assessment & Plan Note (Signed)
Slow to resolve Maxillary Sinusitis Para Nasal Sinuses CT>>Visualized paranasal sinuses are essentially clear. Continued cough/ bronchitis/ yellow secretions Plan: We will repeat DepoMedrol 80 mg IM x 1 today. Starting tomorrow, start Prednisone taper; 10 mg tablets: 4 tabs x 2 days, 3 tabs x 2 days, 2 tabs x 2 days 1 tab x 2 days then stop. Sips of water instead of throat clearing. Sugar Free Jolly Ranchers for throat soothing. Delsym cough syrup during the day for cough. Continue your probiotic as you have been doing. Repeat Sputum Culture today Follow up in 1 month with Dr. Ashok Cordia or me. Please contact office for sooner follow up if symptoms do not improve or worsen or seek emergency care

## 2016-05-03 NOTE — Patient Instructions (Addendum)
It is good to see you today. We will repeat DepoMedrol 80 mg IM x 1 today. Starting tomorrow, start Prednisone taper; 10 mg tablets: 4 tabs x 2 days, 3 tabs x 2 days, 2 tabs x 2 days 1 tab x 2 days then stop. Sips of water instead of throat clearing. Sugar Free Jolly Ranchers for throat soothing. Delsym cough syrup during the day for cough. Continue your probiotic as you have been doing. Repeat Sputum Culture today Follow up in 1 month with Dr. Ashok Cordia or me. Please contact office for sooner follow up if symptoms do not improve or worsen or seek emergency care

## 2016-05-03 NOTE — Progress Notes (Signed)
Note reviewed.  Sonia Baller Ashok Cordia, M.D. [facility] Pulmonary & Critical Care Pager:  [phone] After 3pm or if no response, call [phone] 5:45 PM 05/03/16

## 2016-05-03 NOTE — Assessment & Plan Note (Signed)
Continue Breo and Singulair  daily

## 2016-05-03 NOTE — Assessment & Plan Note (Signed)
- 

## 2016-05-03 NOTE — Assessment & Plan Note (Signed)
Continue Singulair daily  

## 2016-05-03 NOTE — Progress Notes (Signed)
History of Present Illness Brenda [name] is a 66 y.o. female with asthma, GERD, and chronic seasonal allergies.   05/03/2016 2 week Follow Up Appointment: Pt. Is here for follow up of slow to resolve bronchitis with acute maxillary sinusitis She was seen by Dr. Ashok Cordia 04/18/2016. Spirometry was stable at the time, therefore he did not feel asthma was contributing to her cough. She was treated at the time with Depo-Medrol 80 mg injection and  Augmentin 875mg  BID x 10 days. He checked Checking sputum culture for AFB, Fungus, & Bacteria. Checking maxillofacial CT scan without contrast and  quantitative immunoglobulin panel.She presents to the office today with complaints of being no better after Augmentin x 10 days. She continues to have yellow thick secretions with cough, and with nasal secretions. She states she does not have fever. Cough is productive, and she is coughing day and night. She is taking medication at night for cough. She completed her Augmentin x 10 days. She feels the Depo Medrol injection gave her very short term relief. She still has some sinus tenderness, but she states it is better.She states her chest is less tight, and feels less congested. She denies fever, chest pain, orthopnea or hemoptysis.She is compliant with her Breo and Ventolin.She is compliant with her zantac and protonix for reflux. I will repeat prednisone taper and Depo-Medrol for cough and upper airway wheezing. I will give her a break from antibiotics as she has been treated with several over the last few months, and I am weary of c-diff. She currently has no symptoms, or diarrhea.   Tests Microbiology:  Sputum Culture 04/19/2016>> Normal Flora, negative for fungus IgG>> 587 mg/dl IgE >>: < 2.  04/19/2016: CT Paranasal Sinuses IMPRESSION: Visualized paranasal sinuses are essentially clear.  PFT 04/18/16: FVC 2.06 L (76%) FEV1 1.84 L (80%) FEV1/FVC 0.89 FEF 25-75 3.03 L (156%) negative bronchodilator  response TLC 43 L (108%) RV 139% ERV 25% DLCO uncorrected 67% 10/20/13:FVC 2.14 L (86%) FEV1 1.98 L (92%) FEV1/FVC 0.82 FEF 25-75 2.14 L (104%) negative bronchodilator response TLC 3.73 L (83%) RV 70% ERV 65% DLCO uncorrected 92%  IMAGING BARIUM SWALLOW/ESOPHAGRAM 04/16/16 (per radiologist):Mild gastroesophageal reflux. No stricture.   CT CHEST W/O 03/28/15 (previously reviewed by me):No parenchymal nodule or opacity appreciated. No pleural effusion or thickening. No pericardial effusion. No pathologic mediastinal adenopathy.  LABS 06/02/15 HGB: 15.3  Past medical hx Past Medical History:  Diagnosis Date  . Allergic rhinitis   . Allergy   . Anal fissure   . Anemia   . Anxiety   . Arthritis   . Asthma   . Bladder pain   . Complication of anesthesia PAIN ATTACKS   LACTATED RINGERS- CONTRAINDICATED  . Depression   . Fibromyalgia   . Frequency of urination   . GERD (gastroesophageal reflux disease)   . Heart murmur   . History of pancreatitis 1988-1989  . Hyperlipidemia   . Hypertension LABILE  . IBS (irritable bowel syndrome)   . Interstitial cystitis   . Migraines   . Normal cardiac stress test JULY 2009---  NORMAL  . 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 Spasm of sphincter of Oddi 1987  . Stroke ([place])   . Urethral stenosis S/P DILATIONS  . Urgency of urination      Past surgical hx, Family hx, Social hx all reviewed.  Current Outpatient  Prescriptions on File Prior to Visit  Medication Sig  . albuterol (VENTOLIN HFA) 108 (90 Base) MCG/ACT inhaler Inhale into the lungs every 6 (six) hours as needed for wheezing or shortness of breath.  . Calcium Carbonate-Vitamin D (CALCIUM + D PO) Take by mouth daily. 1 tablet daily  . estradiol (VIVELLE-DOT) 0.1 MG/24HR Place 1 patch onto the skin 2 (two) times a week.  . ezetimibe (ZETIA) 10 MG tablet Take 10 mg by mouth daily.   . fish oil-omega-3  fatty acids 1000 MG capsule Take 1 g by mouth daily.   . fluticasone (FLONASE) 50 MCG/ACT nasal spray Place 2 sprays into the nose daily.  . Fluticasone Furoate-Vilanterol (BREO ELLIPTA IN) Inhale 1 puff into the lungs daily.  . folic acid (FOLVITE) 1 MG tablet Take 1 mg by mouth daily.  Marland Kitchen LORazepam (ATIVAN) 1 MG tablet 1/2 to 1 tab twice daily  . metoprolol (LOPRESSOR) 50 MG tablet Take 25 mg by mouth 2 (two) times daily.   . montelukast (SINGULAIR) 10 MG tablet Take 10 mg by mouth at bedtime.  . Multiple Vitamin (MULTIVITAMIN) tablet Take 1 tablet by mouth daily.  Marland Kitchen olmesartan (BENICAR) 20 MG tablet Take 20 mg by mouth daily.  . pantoprazole (PROTONIX) 40 MG tablet Take 40 mg by mouth daily.  . ranitidine (ZANTAC) 150 MG tablet Take 1 tablet (150 mg total) by mouth at bedtime.  . sertraline (ZOLOFT) 50 MG tablet Take 50 mg by mouth daily.  . Vitamin D, Ergocalciferol, (DRISDOL) 50000 UNITS CAPS Take 50,000 Units by mouth every 7 (seven) days.    Marland Kitchen BIOTIN PO Take by mouth.   Current Facility-Administered Medications on File Prior to Visit  Medication  . 0.9 %  sodium chloride infusion     Allergies  Allergen Reactions  . Latex Rash  . Adhesive [Tape] Other (See Comments)    TEARS SKIN  . Bupivacaine Other (See Comments)    Other Reaction: PRE-SYNCOPE, TACTYCARDIA TACHYCARDIA/ PRESYNCOPE  . Butorphanol Tartrate Other (See Comments)    Other Reaction: NUMBNESS, TACHYCARDIA  . Clarithromycin Nausea Only and Other (See Comments)    TACHYCARDIA  . Codeine Other (See Comments)    SEVERE ELEVATED LIVER ENZYMES  . Cymbalta [Duloxetine Hcl] Other (See Comments)    Blurred vision  . Dilaudid [Hydromorphone Hcl] Itching  . Elavil [Amitriptyline Hcl] Other (See Comments)    Elevated liver enzymes  . Hydrocodone Other (See Comments)    Elevated liver enzymes  . Hyoscyamine Sulfate Other (See Comments)  . Lactated Ringers Other (See Comments)    COULD CAUSE PANIC ATTACK  . Levbid  [Hyoscyamine Sulfate] Other (See Comments)    Dizziness, nausea, headache  . Lisinopril Other (See Comments)    Dry cough  . Marcaine [Bupivacaine Hcl] Other (See Comments)    TACHYCARDIA, PRESYNCOPE  . Pentazocine Lactate Other (See Comments)    tachycardia  . Percodan [Oxycodone-Aspirin] Nausea Only  . Stadol [Butorphanol Tartrate] Swelling    Lips numb, tongue swell, tachycardia  . Trazodone And Nefazodone Other (See Comments)    tachycardia  . Vistaril [Hydroxyzine Hcl] Other (See Comments)    Altered sensorium  . Morphine And Related Itching and Rash    Review Of Systems:  Constitutional:   No  weight loss, night sweats,  Fevers, chills, fatigue, or  lassitude.  HEENT:   No headaches,  Difficulty swallowing,  Tooth/dental problems, or  Sore throat,  No sneezing, itching, ear ache, nasal congestion, + post nasal drip,   CV:  No chest pain,  Orthopnea, PND, swelling in lower extremities, anasarca, dizziness, palpitations, syncope.   GI  No heartburn, indigestion, abdominal pain, nausea, vomiting, diarrhea, change in bowel habits, loss of appetite, bloody stools.   Resp: No shortness of breath with exertion or at rest.  + excess mucus, + productive cough,  + non-productive cough,  No coughing up of blood.  + change in color of mucus.  No wheezing.  No chest wall deformity  Skin: no rash or lesions.  GU: no dysuria, change in color of urine, no urgency or frequency.  No flank pain, no hematuria   MS:  No joint pain or swelling.  No decreased range of motion.  No back pain.  Psych:  No change in mood or affect. No depression or anxiety.  No memory loss.   Vital Signs BP 100/64 (BP Location: Right Arm, Patient Position: Sitting, Cuff Size: Normal)   Pulse 69   Ht 4' 11.5" (1.511 m)   Wt 142 lb 3.2 oz (64.5 kg)   SpO2 94%   BMI 28.24 kg/m   Body mass index is 28.24 kg/m.   Physical Exam:  General- No distress,  A&Ox3, pleasant ENT: No sinus  tenderness, TM clear, reddened edematous nasal mucosa, no oral exudate,+ post nasal drip, no LAN Cardiac: S1, S2, regular rate and rhythm, no murmur Chest: No wheeze/ rales/ dullness; no accessory muscle use, no nasal flaring, no sternal retractions Abd.: Soft Non-tender Ext: No clubbing cyanosis, edema Neuro:  normal strength Skin: No rashes, warm and dry Psych: normal mood and behavior   Assessment/Plan  Acute maxillary sinusitis Slow to resolve Maxillary Sinusitis Para Nasal Sinuses CT>>Visualized paranasal sinuses are essentially clear. Continued cough/ bronchitis/ yellow secretions Plan: We will repeat DepoMedrol 80 mg IM x 1 today. Starting tomorrow, start Prednisone taper; 10 mg tablets: 4 tabs x 2 days, 3 tabs x 2 days, 2 tabs x 2 days 1 tab x 2 days then stop. Sips of water instead of throat clearing. Sugar Free Jolly Ranchers for throat soothing. Delsym cough syrup during the day for cough. Continue your probiotic as you have been doing. Repeat Sputum Culture today Follow up in 1 month with Dr. Ashok Cordia or me. Please contact office for sooner follow up if symptoms do not improve or worsen or seek emergency care      Asthma Continue Breo and Singulair  daily  Chronic seasonal allergic rhinitis Continue Singulair daily  GERD (gastroesophageal reflux disease) Continue Protonix daily  Health Maintenence Will need Prevnar and Flu vaccine once she is better.  Magdalen Spatz, NP 05/03/2016  2:09 PM

## 2016-05-04 ENCOUNTER — Other Ambulatory Visit: Payer: PRIVATE HEALTH INSURANCE

## 2016-05-04 DIAGNOSIS — J40 Bronchitis, not specified as acute or chronic: Secondary | ICD-10-CM

## 2016-05-06 LAB — RESPIRATORY CULTURE OR RESPIRATORY AND SPUTUM CULTURE: Organism ID, Bacteria: NORMAL

## 2016-05-07 ENCOUNTER — Telehealth: Payer: Self-pay | Admitting: Pulmonary Disease

## 2016-05-07 NOTE — Telephone Encounter (Signed)
LABS 04/24/16 CBC: 6.7/13.0/41.8/373 Eosinophil: 0.2 BMP: 143/4.5/108/24/12/0.8/96/9.8 LFT: 3.4/6.2/0.2/94/19/20

## 2016-05-08 ENCOUNTER — Telehealth: Payer: Self-pay | Admitting: Internal Medicine

## 2016-05-08 NOTE — Telephone Encounter (Signed)
Does this sound familiar to you? 

## 2016-05-09 ENCOUNTER — Other Ambulatory Visit: Payer: Self-pay | Admitting: Internal Medicine

## 2016-05-09 DIAGNOSIS — K580 Irritable bowel syndrome with diarrhea: Secondary | ICD-10-CM

## 2016-05-09 DIAGNOSIS — Z1231 Encounter for screening mammogram for malignant neoplasm of breast: Secondary | ICD-10-CM

## 2016-05-09 MED ORDER — ALOSETRON HCL 0.5 MG PO TABS: 0.5000 mg | ORAL_TABLET | Freq: Two times a day (BID) | ORAL | 1 refills | Status: DC

## 2016-05-11 ENCOUNTER — Ambulatory Visit: Payer: Self-pay | Admitting: Pulmonary Disease

## 2016-05-18 LAB — FUNGUS CULTURE W SMEAR

## 2016-06-04 LAB — AFB CULTURE WITH SMEAR (NOT AT ARMC)

## 2016-06-07 ENCOUNTER — Ambulatory Visit (INDEPENDENT_AMBULATORY_CARE_PROVIDER_SITE_OTHER)
Admission: RE | Admit: 2016-06-07 | Discharge: 2016-06-07 | Disposition: A | Discharge status: Home / Self Care | Payer: PRIVATE HEALTH INSURANCE | Source: Ambulatory Visit | Attending: Pulmonary Disease | Admitting: Pulmonary Disease

## 2016-06-07 ENCOUNTER — Ambulatory Visit (INDEPENDENT_AMBULATORY_CARE_PROVIDER_SITE_OTHER): Payer: PRIVATE HEALTH INSURANCE | Admitting: Pulmonary Disease

## 2016-06-07 ENCOUNTER — Telehealth: Payer: Self-pay | Admitting: Pulmonary Disease

## 2016-06-07 ENCOUNTER — Encounter: Payer: Self-pay | Admitting: Pulmonary Disease

## 2016-06-07 VITALS — BP 112/72 | HR 73 | Ht 59.5 in | Wt 144.4 lb

## 2016-06-07 DIAGNOSIS — J4540 Moderate persistent asthma, uncomplicated: Secondary | ICD-10-CM

## 2016-06-07 DIAGNOSIS — Z23 Encounter for immunization: Secondary | ICD-10-CM | POA: Diagnosis not present

## 2016-06-07 DIAGNOSIS — K219 Gastro-esophageal reflux disease without esophagitis: Secondary | ICD-10-CM

## 2016-06-07 DIAGNOSIS — J302 Other seasonal allergic rhinitis: Secondary | ICD-10-CM | POA: Diagnosis not present

## 2016-06-07 MED ORDER — AEROCHAMBER MV MISC: 0 refills | Status: DC

## 2016-06-07 MED ORDER — BUDESONIDE-FORMOTEROL FUMARATE 160-4.5 MCG/ACT IN AERO: 2.0000 | INHALATION_SPRAY | Freq: Two times a day (BID) | RESPIRATORY_TRACT | 3 refills | Status: DC

## 2016-06-07 NOTE — Telephone Encounter (Signed)
IMAGING MAXILLOFACIAL CT LTD W/O 04/19/16 (per radiologist): Visualized paranasal sinuses are essentially clear.  MICROBIOLOGY Sputum Culture (04/19/16):  Scant Yeast not identified / AFB negative / Normal Oral Flora Sputum Culture (05/04/16):  Normal Oral Flora  LABS 04/18/16 CBC: 9.9/13.8/40.0/431 IgM: 129 IgA: 139 IgG: 587 IgE: <2 RAST panel: Negative

## 2016-06-07 NOTE — Progress Notes (Addendum)
Subjective:    Patient ID: Brenda [name], female    DOB: 1950-08-21, 66 y.o.   MRN: [id number]  C.C.:  Follow-up for Moderate, Persistent Asthma, GERD, & Chronic Seasonal Allergic Rhinitis.   HPI Moderate, Persistent Asthma: Prescribed Breo and Singulair. She is continuing to cough up mucus that is "creamy white" in color but does seem to progress to a yellow mucus at times. Reports she is compliant with Breo. She does report increased wheezing. She reports no relief from her rescue inhaler. She reports she is not waking up at night coughing. She reports her cough may have improved slightly on Prednisone.  GERD: Prescribed Protonix & followed by GI/Dr. Carlean Purl. Recommended starting previously prescribed Zantac at last appointment. She denies any reflux. She does have occasional dyspepsia. Denies any morning brash water taste.   Chronic Seasonal Allergic Rhinitis: Prescribed Singulair & Flonase. Maxillofacial CT scan without acute sinusitis or opacification from last appointment. Recommended holding off on Flonase during acute sinusitis at last appointment. She reports minimal sinus congestion. She hasn't restarted her Flonase yet.   Review of Systems She denies any chest pain but does have tightness. She reports soreness in her chest with her coughing spells. No fever or chills. No nausea or emesis.   Allergies  Allergen Reactions  . Latex Rash  . Adhesive [Tape] Other (See Comments)    TEARS SKIN  . Bupivacaine Other (See Comments)    Other Reaction: PRE-SYNCOPE, TACTYCARDIA TACHYCARDIA/ PRESYNCOPE  . Butorphanol Tartrate Other (See Comments)    Other Reaction: NUMBNESS, TACHYCARDIA  . Clarithromycin Nausea Only and Other (See Comments)    TACHYCARDIA  . Codeine Other (See Comments)    SEVERE ELEVATED LIVER ENZYMES  . Cymbalta [Duloxetine Hcl] Other (See Comments)    Blurred vision  . Dilaudid [Hydromorphone Hcl] Itching  . Elavil [Amitriptyline Hcl] Other (See Comments)   Elevated liver enzymes  . Hydrocodone Other (See Comments)    Elevated liver enzymes  . Hyoscyamine Sulfate Other (See Comments)  . Lactated Ringers Other (See Comments)    COULD CAUSE PANIC ATTACK  . Levbid [Hyoscyamine Sulfate] Other (See Comments)    Dizziness, nausea, headache  . Lisinopril Other (See Comments)    Dry cough  . Marcaine [Bupivacaine Hcl] Other (See Comments)    TACHYCARDIA, PRESYNCOPE  . Pentazocine Lactate Other (See Comments)    tachycardia  . Percodan [Oxycodone-Aspirin] Nausea Only  . Stadol [Butorphanol Tartrate] Swelling    Lips numb, tongue swell, tachycardia  . Trazodone And Nefazodone Other (See Comments)    tachycardia  . Vistaril [Hydroxyzine Hcl] Other (See Comments)    Altered sensorium  . Morphine And Related Itching and Rash    Current Outpatient Prescriptions on File Prior to Visit  Medication Sig Dispense Refill  . albuterol (VENTOLIN HFA) 108 (90 Base) MCG/ACT inhaler Inhale into the lungs every 6 (six) hours as needed for wheezing or shortness of breath.    . Calcium Carbonate-Vitamin D (CALCIUM + D PO) Take by mouth daily. 1 tablet daily    . estradiol (VIVELLE-DOT) 0.1 MG/24HR Place 1 patch onto the skin 2 (two) times a week.    . ezetimibe (ZETIA) 10 MG tablet Take 10 mg by mouth daily.     . fish oil-omega-3 fatty acids 1000 MG capsule Take 1 g by mouth daily.     . fluticasone (FLONASE) 50 MCG/ACT nasal spray Place 2 sprays into the nose daily.    . Fluticasone Furoate-Vilanterol (BREO ELLIPTA IN) Inhale  1 puff into the lungs daily.    . folic acid (FOLVITE) 1 MG tablet Take 1 mg by mouth daily.    Marland Kitchen LORazepam (ATIVAN) 1 MG tablet 1/2 to 1 tab twice daily    . metoprolol (LOPRESSOR) 50 MG tablet Take 25 mg by mouth 2 (two) times daily.     . montelukast (SINGULAIR) 10 MG tablet Take 10 mg by mouth at bedtime.    . Multiple Vitamin (MULTIVITAMIN) tablet Take 1 tablet by mouth daily.    Marland Kitchen olmesartan (BENICAR) 20 MG tablet Take 20 mg by  mouth daily.    . pantoprazole (PROTONIX) 40 MG tablet Take 40 mg by mouth daily.    . ranitidine (ZANTAC) 150 MG tablet Take 1 tablet (150 mg total) by mouth at bedtime. 30 tablet 3  . sertraline (ZOLOFT) 50 MG tablet Take 50 mg by mouth daily.    . Vitamin D, Ergocalciferol, (DRISDOL) 50000 UNITS CAPS Take 50,000 Units by mouth every 7 (seven) days.      Marland Kitchen alosetron (LOTRONEX) 0.5 MG tablet Take 1 tablet (0.5 mg total) by mouth 2 (two) times daily. (Patient not taking: Reported on 06/07/2016) 60 tablet 1  . BIOTIN PO Take by mouth.    . predniSONE (DELTASONE) 10 MG tablet 4 tabs for 2 days, 3 tabs for 2 days, 2 tabs for 2 days 1 tab for 2 days then stop. (Patient not taking: Reported on 06/07/2016) 20 tablet 0   Current Facility-Administered Medications on File Prior to Visit  Medication Dose Route Frequency Provider Last Rate Last Dose  . 0.9 %  sodium chloride infusion  500 mL Intravenous Continuous Gatha Mayer, MD        Past Medical History:  Diagnosis Date  . Allergic rhinitis   . Allergy   . Anal fissure   . Anemia   . Anxiety   . Arthritis   . Asthma   . Bladder pain   . Complication of anesthesia PAIN ATTACKS   LACTATED RINGERS- CONTRAINDICATED  . Depression   . Fibromyalgia   . Frequency of urination   . GERD (gastroesophageal reflux disease)   . Heart murmur   . History of pancreatitis 1988-1989  . Hyperlipidemia   . Hypertension LABILE  . IBS (irritable bowel syndrome)   . Interstitial cystitis   . Migraines   . Normal cardiac stress test JULY 2009---  NORMAL  . 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 Spasm of sphincter of Oddi 1987  . Stroke ([place])   . Urethral stenosis S/P DILATIONS  . Urgency of urination     Past Surgical History:  Procedure Laterality Date  . ABDOMINAL HYSTERECTOMY  1988  . APPENDECTOMY  04/ 1986  . bilateral reduction mastopexy  1988  . bilateral turbinate  resection  1990   nasal spreading graft  . CATARACT EXTRACTION, BILATERAL    . CHOLECYSTECTOMY  04/ 1986  . COLONOSCOPY  02/2004, 11/2010   2005: Normal - Dr. Sammuel Cooper 2012: Normal  . CYSTO WITH HYDRODISTENSION N/A 12/17/2013   Procedure: CYSTO HYDRODISTENSION WITH INSTALLATION OF CHLOROPACTIN AND TETRACAINE;  Surgeon: Malka So, MD;  Location[facility];  Service: Urology;  Laterality: N/A;  . CYSTO WITH HYDRODISTENSION N/A 06/02/2015   Procedure: CYSTOSCOPY/HYDRODISTENSION WITH INSTILLATION OF CLORPACTIN ;  Surgeon: [name], MD;  Location: [facility];  Service: Urology;  Laterality: N/A;  . CYSTOSCOPY WITH URETHRAL DILATATION  07/12/2011   Procedure: CYSTOSCOPY WITH URETHRAL DILATATION;  Surgeon: Malka So, MD;  Location[facility];  Service: Urology;  Laterality: N/A;  HOD AND INSTILLATION OF CHLOROPACTIN C-ARM   . CYSTOSCOPY WITH URETHRAL DILATATION N/A 06/26/2012   Procedure: CYSTOSCOPY WITH URETHRAL DILATATION HYDRODISTENSION AND INSTILLATION OF CLORPACTION ;  Surgeon: Malka So, MD;  Location: [facility];  Service: Urology;  Laterality: N/A;  CYSTOSCOPY WITH URETHRAL DILATATION HYDRODISTENSION AND INSTILLATION OF CLORPACTION   . CYSTOSTOMY W/ BLADDER BIOPSY  1983  . DILATION AND CURETTAGE OF UTERUS  1987  . ERCP  1988   for pancreatitis with stents   S/P SEVERE PANCREATITIS  . HEMORRHOID SURGERY    . LASIK Bilateral APRIL 2005  . MULTIPLE CYSTO/ HOD/ URETHRAL DILATION/ INSTILLATION CLORPACTIN  .LAST ONE 03-31-2010  . NASAL SINUS SURGERY  1977   sinus cyst  . revision rhinoplasty  1978  . RHINOPLASTY  1977  . RIGHT SALPINGOOPHECTOMY  1998  . TONSILLECTOMY AND ADENOIDECTOMY  1963    Family History  Problem Relation Age of Onset  . Colon cancer Maternal Grandmother   . Colon cancer Cousin   . Allergies Mother   . Asthma Mother   . Heart disease Mother   . Osteoarthritis Mother   . Diabetes Mother   .  Irritable bowel syndrome Mother   . Brain cancer Father   . Osteoarthritis Father   . Colon polyps Father     adenomatous  . Heart disease Father   . Melanoma Sister   . Osteoarthritis Sister   . Irritable bowel syndrome Sister   . Diabetes Sister   . Heart disease Sister   . Stroke Sister   . Ulcerative colitis Maternal Uncle   . Stomach cancer Neg Hx   . Rheumatologic disease Neg Hx     Social History   Social History  . Marital status: Married    Spouse name: N/A  . Number of children: 0  . Years of education: N/A   Occupational History  . RN    Social History Main Topics  . Smoking status: Passive Smoke Exposure - Never Smoker  . Smokeless tobacco: Never Used     Comment: Father smoked briefly  . Alcohol use No  . Drug use: No  . Sexual activity: Not Asked   Other Topics Concern  . None   Social History Narrative   Married, lives with spouse   No children   RN at [employer]   No recent travel      [facility]:   Originally from [place]. Always lived in [place]. Previously has traveled to [place], [place], [place], [place], & mostly [place]. No indoor pets currently. She does have cats in her garage. No bird, mold, or hot tub exposure. No indoor plants. Mostly wood floors. She did have her bedroom carpet taken up in Summer 2017. Has mostly blinds. No wood burning fire place. Previously enjoyed Firefighter & playing piano.       Objective:   Physical Exam BP 112/72 (BP Location: Right Arm, Patient Position: Sitting, Cuff Size: Normal)   Pulse 73   Ht 4' 11.5" (1.511 m)   Wt 144 lb 6.4 oz (65.5 kg)   SpO2 98%   BMI 28.68 kg/m   General:  Awake. Alert. No distress.  Integument:  Warm & dry. No rash on exposed skin.  Extremities:  No cyanosis or clubbing.  HEENT:  Moist mucus membranes. No oral ulcers. Minimal bilateral nasal turbinate swelling. Cardiovascular:  Regular rate. No edema. Normal S1 & S2. Pulmonary:  Upper airway referred wheezing. Good  aeration bilaterally. Intermittent nonproductive cough witnessed. No accessory muscle use. Abdomen: Soft. Normal bowel sounds. Nondistended.  Musculoskeletal:  Normal bulk and tone. No joint deformity or effusion appreciated.  PFT 04/18/16: FVC 2.06 L (76%) FEV1 1.84 L (80%) FEV1/FVC 0.89 FEF 25-75 3.03 L (156%) negative bronchodilator response TLC 43 L (108%) RV 139% ERV 25% DLCO uncorrected 67% 10/20/13: FVC 2.14 L (86%) FEV1 1.98 L (92%) FEV1/FVC 0.82 FEF 25-75 2.14 L (104%) negative bronchodilator response TLC 3.73 L (83%) RV 70% ERV 65% DLCO uncorrected 92%  IMAGING MAXILLOFACIAL CT LTD W/O 04/19/16 (per radiologist): Visualized paranasal sinuses are essentially clear.  BARIUM SWALLOW/ESOPHAGRAM 04/16/16 (per radiologist):  Mild gastroesophageal reflux. No stricture.   CT CHEST W/O 03/28/15 (previously reviewed by me):  No parenchymal nodule or opacity appreciated. No pleural effusion or thickening. No pericardial effusion. No pathologic mediastinal adenopathy.  MICROBIOLOGY Sputum Culture (04/19/16):  Scant Yeast not identified / AFB negative / Normal Oral Flora Sputum Culture (05/04/16):  Normal Oral Flora  LABS 04/18/16 CBC: 9.9/13.8/40.0/431 IgM: 129 IgA: 139 IgG: 587 IgE: <2 RAST panel: Negative  06/02/15 HGB:  15.3    Assessment & Plan:  66 y.o. female with underlying asthma, GERD, & chronic seasonal allergic rhinitis. When last seen by me patient was treated with Augmentin for maxillary sinusitis.  Patient's persistent cough and wheezing seems to be more upper airway related and quite probably is post viral in etiology given persistence. She has no infectious symptoms at this time that would necessitate antibiotic therapy and I'm hesitant to use further steroids. I do believe escalating her dose of inhaled corticosteroid would be of significant benefit to the patient. Overall her allergic rhinitis and reflux seem to be related well-controlled. I am not adjusting these  medications. I instructed the patient contact my office if she had any problems with her new medication or questions before next appointment.  1. Moderate, Persistent Asthma: Checking chest x-ray PA/LAT given persistent cough. Switching from Breo to Symbicort 160/4.5 with spacer today. Reattempting sputum culture for AFB, fungus, and bacteria. 2. Chronic Seasonal Allergic Rhinitis:  Patient continuing on Singulair & Flonase. 3. GERD:  Patient continuing on Zantac and Protonix. 4. Health Maintenance:  S/P Pneumovax 19 February 2013. Administering influenza vaccine today. Plan for Prevnar vaccine at next appointment. 5. Follow-up: Patient to return to clinic in 3 weeks or sooner if needed.   Sonia Baller Ashok Cordia, M.D. [facility] Pager:  [phone] After 3pm or if no response, call [phone] 9:23 AM 06/07/16

## 2016-06-07 NOTE — Patient Instructions (Signed)
   Continue using your Protonix, Zantac, Singulair & Flonase.  We are replacing your Breo with Symbicort. Remember to rinse, gargle & spit afterward to keep form getting thrush.  Call me if you have any new breathing problems or questions before your next appointment.  I will see you back in 3 weeks or sooner if needed.   TESTS ORDERED: 1. Sputum culture AFB, Fungus, & Bacteria 2. CXR PA/LAT Today

## 2016-06-08 ENCOUNTER — Telehealth: Payer: Self-pay | Admitting: Pulmonary Disease

## 2016-06-08 NOTE — Telephone Encounter (Signed)
Notes Recorded by Javier Glazier, MD on 06/07/2016 at 5:54 PM EST Please let the patient know I reviewed her chest x-ray. Her lungs are clear without obvious mass or abnormality. Thank you.   Called and spoke with pt and she is aware of cxr results. Nothing further is needed.

## 2016-06-14 ENCOUNTER — Other Ambulatory Visit: Payer: PRIVATE HEALTH INSURANCE

## 2016-06-14 DIAGNOSIS — J4540 Moderate persistent asthma, uncomplicated: Secondary | ICD-10-CM

## 2016-06-16 LAB — RESPIRATORY CULTURE OR RESPIRATORY AND SPUTUM CULTURE: Organism ID, Bacteria: NORMAL

## 2016-06-19 ENCOUNTER — Telehealth: Payer: Self-pay | Admitting: Pulmonary Disease

## 2016-06-19 ENCOUNTER — Ambulatory Visit
Admission: RE | Admit: 2016-06-19 | Discharge: 2016-06-19 | Disposition: A | Discharge status: Home / Self Care | Payer: PRIVATE HEALTH INSURANCE | Source: Ambulatory Visit | Attending: Internal Medicine | Admitting: Internal Medicine

## 2016-06-19 ENCOUNTER — Telehealth: Payer: Self-pay | Admitting: Internal Medicine

## 2016-06-19 DIAGNOSIS — Z1231 Encounter for screening mammogram for malignant neoplasm of breast: Secondary | ICD-10-CM

## 2016-06-19 DIAGNOSIS — R05 Cough: Secondary | ICD-10-CM

## 2016-06-19 DIAGNOSIS — R059 Cough, unspecified: Secondary | ICD-10-CM

## 2016-06-19 NOTE — Telephone Encounter (Signed)
Pt would like to wait to schedule the appt with ENT at [facility] until after her appt with [facility] on 07/06/16. Will forward to [facility] to make them aware.

## 2016-06-19 NOTE — Telephone Encounter (Signed)
lmtcb X1 for pt  

## 2016-06-19 NOTE — Telephone Encounter (Signed)
Patient returning call -pr °

## 2016-06-19 NOTE — Telephone Encounter (Signed)
I have deferred this appt this 07/06/16 after pt see's dr Clyda Hurdle

## 2016-06-19 NOTE — Telephone Encounter (Signed)
Called and spoke with pt and she is aware of [facility] recs and the order has been placed.  Nothing further is needed.

## 2016-06-19 NOTE — Telephone Encounter (Signed)
Let's put in a referral to [facility] to have them evaluate her for her persistent cough. They can do a direct laryngoscopy in the office to look at her vocal chords. That'll be an easier procedure than doing a bronchoscopy with conscious sedation. Thanks.

## 2016-06-19 NOTE — Telephone Encounter (Signed)
She is having straining with lotronex.  She is not taking it daily.  Really having to strain and has some cramping. She states would rather have diarrhea than the constipation and straining that lotronex caused.  Is there an alternative.  Ok to use PRN?

## 2016-06-19 NOTE — Telephone Encounter (Signed)
Spoke with pt, who states she pt baseline since her OV on 06-07-16. Pt c/o prod cough with yellow mucus, wheezing & chest tightness X 11mo. Denies fever, chills or sweats. Pt taken ventolin tid & mucinex. Pt is requesting abx and prednisone.  JN please advise. Thanks.   Allergies  Allergen Reactions  . Latex Rash  . Adhesive [Tape] Other (See Comments)    TEARS SKIN  . Bupivacaine Other (See Comments)    Other Reaction: PRE-SYNCOPE, TACTYCARDIA TACHYCARDIA/ PRESYNCOPE  . Butorphanol Tartrate Other (See Comments)    Other Reaction: NUMBNESS, TACHYCARDIA  . Clarithromycin Nausea Only and Other (See Comments)    TACHYCARDIA  . Codeine Other (See Comments)    SEVERE ELEVATED LIVER ENZYMES  . Cymbalta [Duloxetine Hcl] Other (See Comments)    Blurred vision  . Dilaudid [Hydromorphone Hcl] Itching  . Elavil [Amitriptyline Hcl] Other (See Comments)    Elevated liver enzymes  . Hydrocodone Other (See Comments)    Elevated liver enzymes  . Hyoscyamine Sulfate Other (See Comments)  . Lactated Ringers Other (See Comments)    COULD CAUSE PANIC ATTACK  . Levbid [Hyoscyamine Sulfate] Other (See Comments)    Dizziness, nausea, headache  . Lisinopril Other (See Comments)    Dry cough  . Marcaine [Bupivacaine Hcl] Other (See Comments)    TACHYCARDIA, PRESYNCOPE  . Pentazocine Lactate Other (See Comments)    tachycardia  . Percodan [Oxycodone-Aspirin] Nausea Only  . Stadol [Butorphanol Tartrate] Swelling    Lips numb, tongue swell, tachycardia  . Trazodone And Nefazodone Other (See Comments)    tachycardia  . Vistaril [Hydroxyzine Hcl] Other (See Comments)    Altered sensorium  . Morphine And Related Itching and Rash

## 2016-06-21 ENCOUNTER — Encounter (INDEPENDENT_AMBULATORY_CARE_PROVIDER_SITE_OTHER): Payer: Self-pay | Admitting: Orthopedic Surgery

## 2016-06-21 ENCOUNTER — Ambulatory Visit (INDEPENDENT_AMBULATORY_CARE_PROVIDER_SITE_OTHER): Payer: Self-pay

## 2016-06-21 ENCOUNTER — Ambulatory Visit (INDEPENDENT_AMBULATORY_CARE_PROVIDER_SITE_OTHER): Payer: Worker's Compensation | Admitting: Orthopedic Surgery

## 2016-06-21 VITALS — BP 110/83 | HR 85 | Resp 14 | Ht 60.0 in | Wt 140.0 lb

## 2016-06-21 DIAGNOSIS — M25562 Pain in left knee: Secondary | ICD-10-CM

## 2016-06-21 DIAGNOSIS — S8002XA Contusion of left knee, initial encounter: Secondary | ICD-10-CM

## 2016-06-21 DIAGNOSIS — S5001XA Contusion of right elbow, initial encounter: Secondary | ICD-10-CM

## 2016-06-21 DIAGNOSIS — G8929 Other chronic pain: Secondary | ICD-10-CM | POA: Diagnosis not present

## 2016-06-21 NOTE — Progress Notes (Signed)
Office Visit Note   Patient: Brenda [name]           Date of Birth: [birth year]           MRN: [id number] Visit Date: 06/21/2016              Requested by: Prince Solian, MD [address] [name], [place] [address] PCP: Tivis Ringer, MD   Assessment & Plan: Visit Diagnoses:  1. Contusion of left knee, initial encounter   2. Chronic pain of left knee   3. Contusion of right elbow, initial encounter     Plan:  #1: Long knee immobilizer to the left leg #2: Ice to right elbow and left knee #3: Gave her samples of Pennsaid for her left shoulder which is a chronic problem previously  Follow-Up Instructions: Return in about 1 day (around 06/22/2016).   Orders:  Orders Placed This Encounter  Procedures  . XR KNEE 3 VIEW LEFT   No orders of the defined types were placed in this encounter.     Procedures: No procedures performed   Clinical Data: No additional findings.   Subjective: Chief Complaint  Patient presents with  . Left Knee - Pain    Brenda [name] is a 66 year old white female employee of [employer] who presents as a worker's comp case todaY for evaluation of her right knee. She she was walking down the hall carrying supplies in her hands when she tripped and fell to the right striking her right shoulder on the wall and then fell to her left knee and struck her right elbow. This occurred today at work and she placed a bag of ice on her knee. Pt took 500 mg Tylenol at 9:00am and was scheduled for an appointment today for evaluation. Denies any lack of consciousness.    Review of Systems  Constitutional:       History of fibromyalgia  Genitourinary:       History of irritable bowel syndrome and spasms of sphincter of ODI.   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    Objective: Vital Signs: BP 110/83   Pulse 85   Resp 14   Ht 5' (1.524 m)   Wt 140 lb  (63.5 kg)   BMI 27.34 kg/m   Physical Exam  Constitutional: She is oriented to person, place, and time. She appears well-developed and well-nourished.  HENT:  Head: Normocephalic and atraumatic.  Eyes: EOM are normal. Pupils are equal, round, and reactive to light.  Pulmonary/Chest: Effort normal.  Neurological: She is alert and oriented to person, place, and time.  Skin: Skin is warm and dry.  Psychiatric: She has a normal mood and affect. Her behavior is normal. Judgment and thought content normal.    Left Knee Exam   Tenderness  The patient is experiencing tenderness in the patella and patellar tendon.  Range of Motion  Extension: 0  Flexion: 110   Tests  Lachman:  Anterior - trace     Drawer:       Anterior - trace      Varus: negative Valgus: negative  Other  Sensation: normal Pulse: present  Comments:  Tender over the inferior pole of the patella as well as the patellar tendon. No real effusion at this time though this is just a recent injury from today. Varus and valgus stressing is negative. She does  have painful range of motion. Trace patellofemoral crepitance.   Right Elbow Exam   Tenderness  The patient is experiencing tenderness in the olecranon fossa.   Range of Motion  Extension: 0  Flexion: 130  Pronation: normal  Supination: normal   Comments:  She is tender over the olecranon to palpation. Abrasion noted. No medial or lateral epicondylar pain. No swelling.      Specialty Comments:  No specialty comments available.  Imaging: Xr Knee 3 View Left  Result Date: 06/21/2016 Three-view x-rays of the left knee reveals some lateral positioning of the patella on the AP. Some sclerosing medial tibial plateau. She does have the some cystic changes and superior pole of the patella the inferior articular surface of the patella on the lateral. No Fracture noted    PMFS History: Patient Active Problem List   Diagnosis Date Noted  . Acute maxillary  sinusitis 04/18/2016  . Moderate persistent asthma 03/19/2016  . Chronic seasonal allergic rhinitis 03/19/2016  . GERD (gastroesophageal reflux disease) 03/19/2016  . Cough 03/19/2016  . Essential hypertension, benign 03/19/2016  . H/O: CVA (cerebrovascular accident) 03/19/2016  . IBS (irritable bowel syndrome) 03/19/2016  . Interstitial cystitis 03/19/2016  . Fibromyalgia 03/19/2016  . DJD (degenerative joint disease) 03/19/2016  . Arthritis 03/19/2016  . Sphincter of Oddi dysfunction 03/19/2016  . H/O acute pancreatitis 03/19/2016   Past Medical History:  Diagnosis Date  . Allergic rhinitis   . Allergy   . Anal fissure   . Anemia   . Anxiety   . Arthritis   . Asthma   . Bladder pain   . Complication of anesthesia PAIN ATTACKS   LACTATED RINGERS- CONTRAINDICATED  . Depression   . Fibromyalgia   . Frequency of urination   . GERD (gastroesophageal reflux disease)   . Heart murmur   . History of pancreatitis 1988-1989  . Hyperlipidemia   . Hypertension LABILE  . IBS (irritable bowel syndrome)   . Interstitial cystitis   . Migraines   . Normal cardiac stress test JULY 2009---  NORMAL  . 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 Spasm of sphincter of Oddi 1987  . Stroke ([place])   . Urethral stenosis S/P DILATIONS  . Urgency of urination     Family History  Problem Relation Age of Onset  . Colon cancer Maternal Grandmother   . Colon cancer Cousin   . Allergies Mother   . Asthma Mother   . Heart disease Mother   . Osteoarthritis Mother   . Diabetes Mother   . Irritable bowel syndrome Mother   . Brain cancer Father   . Osteoarthritis Father   . Colon polyps Father     adenomatous  . Heart disease Father   . Melanoma Sister   . Osteoarthritis Sister   . Irritable bowel syndrome Sister   . Diabetes Sister   . Heart disease Sister   . Stroke Sister   . Ulcerative colitis Maternal Uncle   .  Stomach cancer Neg Hx   . Rheumatologic disease Neg Hx     Past Surgical History:  Procedure Laterality Date  . ABDOMINAL HYSTERECTOMY  1988  . APPENDECTOMY  04/ 1986  . bilateral reduction mastopexy  1988  . bilateral turbinate resection  1990   nasal spreading graft  . CATARACT EXTRACTION, BILATERAL    . CHOLECYSTECTOMY  04/ 1986  .  COLONOSCOPY  02/2004, 11/2010   2005: Normal - Dr. Sammuel Cooper 2012: Normal  . CYSTO WITH HYDRODISTENSION N/A 12/17/2013   Procedure: CYSTO HYDRODISTENSION WITH INSTALLATION OF CHLOROPACTIN AND TETRACAINE;  Surgeon: Malka So, MD;  Location: [facility];  Service: Urology;  Laterality: N/A;  . CYSTO WITH HYDRODISTENSION N/A 06/02/2015   Procedure: CYSTOSCOPY/HYDRODISTENSION WITH INSTILLATION OF CLORPACTIN ;  Surgeon: [name], MD;  Location: [facility];  Service: Urology;  Laterality: N/A;  . CYSTOSCOPY WITH URETHRAL DILATATION  07/12/2011   Procedure: CYSTOSCOPY WITH URETHRAL DILATATION;  Surgeon: Malka So, MD;  Location: [facility];  Service: Urology;  Laterality: N/A;  HOD AND INSTILLATION OF CHLOROPACTIN C-ARM   . CYSTOSCOPY WITH URETHRAL DILATATION N/A 06/26/2012   Procedure: CYSTOSCOPY WITH URETHRAL DILATATION HYDRODISTENSION AND INSTILLATION OF CLORPACTION ;  Surgeon: Malka So, MD;  Location: [facility];  Service: Urology;  Laterality: N/A;  CYSTOSCOPY WITH URETHRAL DILATATION HYDRODISTENSION AND INSTILLATION OF CLORPACTION   . CYSTOSTOMY W/ BLADDER BIOPSY  1983  . DILATION AND CURETTAGE OF UTERUS  1987  . ERCP  1988   for pancreatitis with stents   S/P SEVERE PANCREATITIS  . HEMORRHOID SURGERY    . LASIK Bilateral APRIL 2005  . MULTIPLE CYSTO/ HOD/ URETHRAL DILATION/ INSTILLATION CLORPACTIN  .LAST ONE 03-31-2010  . NASAL SINUS SURGERY  1977   sinus cyst  . REDUCTION MAMMAPLASTY Bilateral 1988  . revision rhinoplasty  1978  . RHINOPLASTY  1977  . RIGHT SALPINGOOPHECTOMY  1998    . TONSILLECTOMY AND ADENOIDECTOMY  1963   Social History   Occupational History  . RN    Social History Main Topics  . Smoking status: Passive Smoke Exposure - Never Smoker  . Smokeless tobacco: Never Used     Comment: Father smoked briefly  . Alcohol use No  . Drug use: No  . Sexual activity: Not on file

## 2016-06-25 ENCOUNTER — Telehealth (INDEPENDENT_AMBULATORY_CARE_PROVIDER_SITE_OTHER): Payer: Self-pay | Admitting: Orthopaedic Surgery

## 2016-06-25 NOTE — Telephone Encounter (Signed)
Patient would like to know how long she has to wear the knee brace? Please advise.

## 2016-06-25 NOTE — Telephone Encounter (Signed)
She may remove if pain is improved

## 2016-06-25 NOTE — Telephone Encounter (Signed)
She may remove if her pain is improved

## 2016-06-25 NOTE — Telephone Encounter (Signed)
Please advise 

## 2016-06-26 NOTE — Telephone Encounter (Signed)
We'll need follow-up appointment with Dr. Durward Fortes and then possible cortisone injection versus MRI scan

## 2016-06-26 NOTE — Telephone Encounter (Signed)
Pt has to go through [employer]

## 2016-06-26 NOTE — Telephone Encounter (Signed)
FYI:   Spoke with pt and she is not feeling better at all. She just was in contact with WC.Marland KitchenMarland Kitchen

## 2016-06-27 ENCOUNTER — Ambulatory Visit (HOSPITAL_COMMUNITY)
Admission: EM | Admit: 2016-06-27 | Discharge: 2016-06-27 | Disposition: A | Discharge status: Home / Self Care | Payer: Worker's Compensation | Attending: Family Medicine | Admitting: Family Medicine

## 2016-06-27 ENCOUNTER — Encounter (HOSPITAL_COMMUNITY): Payer: Self-pay | Admitting: *Deleted

## 2016-06-27 ENCOUNTER — Ambulatory Visit (INDEPENDENT_AMBULATORY_CARE_PROVIDER_SITE_OTHER): Payer: Worker's Compensation

## 2016-06-27 DIAGNOSIS — M542 Cervicalgia: Secondary | ICD-10-CM

## 2016-06-27 MED ORDER — KETOROLAC TROMETHAMINE 30 MG/ML IJ SOLN
30.0000 mg | Freq: Once | INTRAMUSCULAR | Status: AC
  Administered 2016-06-27: 30 mg via INTRAMUSCULAR

## 2016-06-27 MED ORDER — KETOROLAC TROMETHAMINE 30 MG/ML IJ SOLN
INTRAMUSCULAR | Status: AC
  Filled 2016-06-27: qty 1

## 2016-06-27 NOTE — Discharge Instructions (Signed)
Heat to neck and see your doctor as needed, return if any problems.

## 2016-06-27 NOTE — ED Provider Notes (Signed)
[place]    CSN: [id number] Arrival date & time: 06/27/16  1729     History   Chief Complaint Chief Complaint  Patient presents with  . Fall    HPI Brenda [name] is a 66 y.o. female.   The history is provided by the patient.  Fall  This is a new problem. The current episode started more than 1 week ago (fell at work 8 days ago with mult injurues but neck pain continues, o neuro sx,mainly stiff and sore.). The problem has been gradually worsening. Associated symptoms include headaches. Pertinent negatives include no chest pain and no abdominal pain.    Past Medical History:  Diagnosis Date  . Allergic rhinitis   . Allergy   . Anal fissure   . Anemia   . Anxiety   . Arthritis   . Asthma   . Bladder pain   . Complication of anesthesia PAIN ATTACKS   LACTATED RINGERS- CONTRAINDICATED  . Depression   . Fibromyalgia   . Frequency of urination   . GERD (gastroesophageal reflux disease)   . Heart murmur   . History of pancreatitis 1988-1989  . Hyperlipidemia   . Hypertension LABILE  . IBS (irritable bowel syndrome)   . Interstitial cystitis   . Migraines   . Normal cardiac stress test JULY 2009---  NORMAL  . 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 Spasm of sphincter of Oddi 1987  . Stroke ([place])   . Urethral stenosis S/P DILATIONS  . Urgency of urination     Patient Active Problem List   Diagnosis Date Noted  . Acute maxillary sinusitis 04/18/2016  . Moderate persistent asthma 03/19/2016  . Chronic seasonal allergic rhinitis 03/19/2016  . GERD (gastroesophageal reflux disease) 03/19/2016  . Cough 03/19/2016  . Essential hypertension, benign 03/19/2016  . H/O: CVA (cerebrovascular accident) 03/19/2016  . IBS (irritable bowel syndrome) 03/19/2016  . Interstitial cystitis 03/19/2016  . Fibromyalgia 03/19/2016  . DJD (degenerative joint disease) 03/19/2016  . Arthritis  03/19/2016  . Sphincter of Oddi dysfunction 03/19/2016  . H/O acute pancreatitis 03/19/2016    Past Surgical History:  Procedure Laterality Date  . ABDOMINAL HYSTERECTOMY  1988  . APPENDECTOMY  04/ 1986  . bilateral reduction mastopexy  1988  . bilateral turbinate resection  1990   nasal spreading graft  . CATARACT EXTRACTION, BILATERAL    . CHOLECYSTECTOMY  04/ 1986  . COLONOSCOPY  02/2004, 11/2010   2005: Normal - Dr. Sammuel Cooper 2012: Normal  . CYSTO WITH HYDRODISTENSION N/A 12/17/2013   Procedure: CYSTO HYDRODISTENSION WITH INSTALLATION OF CHLOROPACTIN AND TETRACAINE;  Surgeon: Malka So, MD;  Location: [facility];  Service: Urology;  Laterality: N/A;  . CYSTO WITH HYDRODISTENSION N/A 06/02/2015   Procedure: CYSTOSCOPY/HYDRODISTENSION WITH INSTILLATION OF CLORPACTIN ;  Surgeon: [name], MD;  Location: [facility];  Service: Urology;  Laterality: N/A;  . CYSTOSCOPY WITH URETHRAL DILATATION  07/12/2011   Procedure: CYSTOSCOPY WITH URETHRAL DILATATION;  Surgeon: Malka So, MD;  Location: [facility];  Service: Urology;  Laterality: N/A;  HOD AND INSTILLATION OF CHLOROPACTIN C-ARM   . CYSTOSCOPY WITH URETHRAL DILATATION N/A 06/26/2012   Procedure: CYSTOSCOPY WITH URETHRAL DILATATION HYDRODISTENSION AND INSTILLATION OF CLORPACTION ;  Surgeon: Malka So, MD;  Location: [facility];  Service: Urology;  Laterality: N/A;  CYSTOSCOPY  WITH URETHRAL DILATATION HYDRODISTENSION AND INSTILLATION OF CLORPACTION   . CYSTOSTOMY W/ BLADDER BIOPSY  1983  . DILATION AND CURETTAGE OF UTERUS  1987  . ERCP  1988   for pancreatitis with stents   S/P SEVERE PANCREATITIS  . HEMORRHOID SURGERY    . LASIK Bilateral APRIL 2005  . MULTIPLE CYSTO/ HOD/ URETHRAL DILATION/ INSTILLATION CLORPACTIN  .LAST ONE 03-31-2010  . NASAL SINUS SURGERY  1977   sinus cyst  . REDUCTION MAMMAPLASTY Bilateral 1988  . revision rhinoplasty  1978  . RHINOPLASTY   1977  . RIGHT SALPINGOOPHECTOMY  1998  . TONSILLECTOMY AND ADENOIDECTOMY  1963    OB History    No data available       Home Medications    Prior to Admission medications   Medication Sig Start Date End Date Taking? Authorizing Provider  albuterol (VENTOLIN HFA) 108 (90 Base) MCG/ACT inhaler Inhale into the lungs every 6 (six) hours as needed for wheezing or shortness of breath.    Historical Provider, MD  alosetron (LOTRONEX) 0.5 MG tablet Take 1 tablet (0.5 mg total) by mouth 2 (two) times daily. Patient not taking: Reported on 06/07/2016 05/09/16   Gatha Mayer, MD  BIOTIN PO Take by mouth.    Historical Provider, MD  budesonide-formoterol (SYMBICORT) 160-4.5 MCG/ACT inhaler Inhale 2 puffs into the lungs 2 (two) times daily. 06/07/16   Javier Glazier, MD  Calcium Carbonate-Vitamin D (CALCIUM + D PO) Take by mouth daily. 1 tablet daily    Historical Provider, MD  estradiol (VIVELLE-DOT) 0.1 MG/24HR Place 1 patch onto the skin 2 (two) times a week.    Historical Provider, MD  ezetimibe (ZETIA) 10 MG tablet Take 10 mg by mouth daily.     Historical Provider, MD  fish oil-omega-3 fatty acids 1000 MG capsule Take 1 g by mouth daily.     Historical Provider, MD  fluticasone (FLONASE) 50 MCG/ACT nasal spray Place 2 sprays into the nose daily.    Historical Provider, MD  Fluticasone Furoate-Vilanterol (BREO ELLIPTA IN) Inhale 1 puff into the lungs daily.    Historical Provider, MD  folic acid (FOLVITE) 1 MG tablet Take 1 mg by mouth daily.    Historical Provider, MD  LORazepam (ATIVAN) 1 MG tablet 1/2 to 1 tab twice daily    Historical Provider, MD  metoprolol (LOPRESSOR) 50 MG tablet Take 25 mg by mouth 2 (two) times daily.     Historical Provider, MD  montelukast (SINGULAIR) 10 MG tablet Take 10 mg by mouth at bedtime.    Historical Provider, MD  Multiple Vitamin (MULTIVITAMIN) tablet Take 1 tablet by mouth daily.    Historical Provider, MD  olmesartan (BENICAR) 20 MG tablet Take 20 mg by  mouth daily.    Historical Provider, MD  pantoprazole (PROTONIX) 40 MG tablet Take 40 mg by mouth daily.    Historical Provider, MD  predniSONE (DELTASONE) 10 MG tablet 4 tabs for 2 days, 3 tabs for 2 days, 2 tabs for 2 days 1 tab for 2 days then stop. Patient not taking: Reported on 06/07/2016 05/03/16   Magdalen Spatz, NP  ranitidine (ZANTAC) 150 MG tablet Take 1 tablet (150 mg total) by mouth at bedtime. 04/17/16 04/17/17  Javier Glazier, MD  sertraline (ZOLOFT) 50 MG tablet Take 50 mg by mouth daily.    Historical Provider, MD  Spacer/Aero-Holding Chambers (AEROCHAMBER MV) inhaler Use as instructed 06/07/16   Javier Glazier, MD  Vitamin D, Ergocalciferol, (DRISDOL)  50000 UNITS CAPS Take 50,000 Units by mouth every 7 (seven) days.      Historical Provider, MD    Family History Family History  Problem Relation Age of Onset  . Colon cancer Maternal Grandmother   . Colon cancer Cousin   . Allergies Mother   . Asthma Mother   . Heart disease Mother   . Osteoarthritis Mother   . Diabetes Mother   . Irritable bowel syndrome Mother   . Brain cancer Father   . Osteoarthritis Father   . Colon polyps Father     adenomatous  . Heart disease Father   . Melanoma Sister   . Osteoarthritis Sister   . Irritable bowel syndrome Sister   . Diabetes Sister   . Heart disease Sister   . Stroke Sister   . Ulcerative colitis Maternal Uncle   . Stomach cancer Neg Hx   . Rheumatologic disease Neg Hx     Social History Social History  Substance Use Topics  . Smoking status: Passive Smoke Exposure - Never Smoker  . Smokeless tobacco: Never Used     Comment: Father smoked briefly  . Alcohol use No     Allergies   Latex; Adhesive [tape]; Bupivacaine; Butorphanol tartrate; Clarithromycin; Codeine; Cymbalta [duloxetine hcl]; Dilaudid [hydromorphone hcl]; Elavil [amitriptyline hcl]; Hydrocodone; Hyoscyamine sulfate; Lactated ringers; Levbid [hyoscyamine sulfate]; Lisinopril; Marcaine [bupivacaine  hcl]; Pentazocine lactate; Percodan [oxycodone-aspirin]; Stadol [butorphanol tartrate]; Trazodone and nefazodone; Vistaril [hydroxyzine hcl]; and Morphine and related   Review of Systems Review of Systems  Constitutional: Negative.   Cardiovascular: Negative for chest pain.  Gastrointestinal: Negative for abdominal pain.  Musculoskeletal: Positive for myalgias, neck pain and neck stiffness.  Neurological: Positive for headaches.     Physical Exam Triage Vital Signs ED Triage Vitals  Enc Vitals Group     BP 06/27/16 1814 180/66     Pulse Rate 06/27/16 1814 65     Resp 06/27/16 1814 18     Temp 06/27/16 1814 98.1 F (36.7 C)     Temp Source 06/27/16 1814 Oral     SpO2 06/27/16 1814 100 %     Weight --      Height --      Head Circumference --      Peak Flow --      Pain Score 06/27/16 1817 6     Pain Loc --      Pain Edu? --      Excl. in [place]? --    No data found.   Updated Vital Signs BP 180/66 (BP Location: Right Arm)   Pulse 65   Temp 98.1 F (36.7 C) (Oral)   Resp 18   SpO2 100%   Visual Acuity Right Eye Distance:   Left Eye Distance:   Bilateral Distance:    Right Eye Near:   Left Eye Near:    Bilateral Near:     Physical Exam  Neck: Trachea normal. Muscular tenderness present. No spinous process tenderness present. Carotid bruit is not present. Neck rigidity present. Decreased range of motion present. No Brudzinski's sign and no Kernig's sign noted. No thyroid mass present.  Lymphadenopathy:    She has no cervical adenopathy.     UC Treatments / Results  Labs (all labs ordered are listed, but only abnormal results are displayed) Labs Reviewed - No data to display  EKG  EKG Interpretation None       Radiology Dg Cervical Spine Complete  Result Date: 06/27/2016 CLINICAL DATA:  Patient fell at work and hit head on wall EXAM: [place] 4+ VIEW COMPARISON:  08/18/2015.  02/18/2011 FINDINGS: No evidence of an acute fracture. No  subluxation. Loss of disc height with endplate spurring at [id number] is stable since 08/18/2015. Facets are well aligned bilaterally. Oblique films show foraminal encroachment on the left at C3-4 and C4-5 secondary to facet hypertrophy. Mild straightening normal cervical lordosis evident, similar to prior. No prevertebral soft tissue swelling. IMPRESSION: Degenerative changes without acute bony findings. Electronically Signed   By: Misty Stanley M.D.   On: 06/27/2016 19:14    Procedures Procedures (including critical care time)  Medications Ordered in UC Medications  ketorolac (TORADOL) 30 MG/ML injection 30 mg (30 mg Intramuscular Given 06/27/16 1944)     Initial Impression / Assessment and Plan / UC Course  I have reviewed the triage vital signs and the nursing notes.  Pertinent labs & imaging results that were available during my care of the patient were reviewed by me and considered in my medical decision making (see chart for details).       Final Clinical Impressions(s) / UC Diagnoses   Final diagnoses:  Musculoskeletal neck pain    New Prescriptions Discharge Medication List as of 06/27/2016  7:31 PM       Billy Fischer, MD 06/27/16 2035

## 2016-06-27 NOTE — ED Triage Notes (Signed)
Fell   8  Days   Ago     At  Work   And      Saw  An  Orthopedist         That    And   X  Rayed  Her  Knee      Pt  Reports  Has      Neck    Pain      That    Was  Not addresed    She  Reports  Neck   And  r  Elbow  Pain       Has  appt   March  12   With    The  Same  One  That  Saw  Her   Still  Has  Pain l  Knee

## 2016-07-02 ENCOUNTER — Ambulatory Visit (INDEPENDENT_AMBULATORY_CARE_PROVIDER_SITE_OTHER): Payer: Self-pay | Admitting: Orthopaedic Surgery

## 2016-07-03 ENCOUNTER — Ambulatory Visit: Payer: Self-pay | Admitting: Pulmonary Disease

## 2016-07-06 ENCOUNTER — Encounter: Payer: Self-pay | Admitting: Pulmonary Disease

## 2016-07-06 ENCOUNTER — Telehealth: Payer: Self-pay | Admitting: Pulmonary Disease

## 2016-07-06 ENCOUNTER — Ambulatory Visit (INDEPENDENT_AMBULATORY_CARE_PROVIDER_SITE_OTHER): Payer: PRIVATE HEALTH INSURANCE | Admitting: Pulmonary Disease

## 2016-07-06 VITALS — BP 118/80 | HR 95 | Ht 60.0 in | Wt 142.8 lb

## 2016-07-06 DIAGNOSIS — Z23 Encounter for immunization: Secondary | ICD-10-CM

## 2016-07-06 DIAGNOSIS — J4540 Moderate persistent asthma, uncomplicated: Secondary | ICD-10-CM | POA: Diagnosis not present

## 2016-07-06 DIAGNOSIS — R05 Cough: Secondary | ICD-10-CM

## 2016-07-06 DIAGNOSIS — J302 Other seasonal allergic rhinitis: Secondary | ICD-10-CM | POA: Diagnosis not present

## 2016-07-06 DIAGNOSIS — K219 Gastro-esophageal reflux disease without esophagitis: Secondary | ICD-10-CM | POA: Diagnosis not present

## 2016-07-06 DIAGNOSIS — R059 Cough, unspecified: Secondary | ICD-10-CM

## 2016-07-06 MED ORDER — AZELASTINE-FLUTICASONE 137-50 MCG/ACT NA SUSP: 1.0000 | Freq: Every day | NASAL | 0 refills | Status: DC

## 2016-07-06 NOTE — Addendum Note (Signed)
Addended by: Tyson Dense on: 07/06/2016 12:44 PM   Modules accepted: Orders

## 2016-07-06 NOTE — Progress Notes (Signed)
Patient seen in the office today and instructed on use of Dymista.  Patient expressed understanding and demonstrated technique.

## 2016-07-06 NOTE — Telephone Encounter (Signed)
IMAGING CXR PA/LAT 06/07/16 (personally reviewed by me):  No focal opacity or mass appreciated. No pleural effusion. Heart normal in size & mediastinum normal in contour.  MICROBIOLOGY Sputum Culture (06/14/16):  Normal Oral Flora / AFB pending / Fungus pending

## 2016-07-06 NOTE — Progress Notes (Signed)
Subjective:    Patient ID: Brenda [name], female    DOB: 1950-05-13, 66 y.o.   MRN: [id number]  C.C.:  Follow-up for Moderate, Persistent Asthma, GERD, & Chronic Seasonal Allergic Rhinitis.   HPI Moderate, persistent asthma: Previously on Breo and Singulair. Previously reported only slight improvement with cough on prednisone. Tried Symbicort at last appointment. She reports her cough continues. She denies any nocturnal awakenings with her coughing. She does wheeze occasionally.   Chronic seasonal allergic rhinitis: Prescribed Singulair and Flonase. Referred to ENT at [facility] since last appointment. She reports she still has sinus congestion & drainage.  GERD: Currently on Protonix & Zantac. Followed by GI/Dr. Carlean Purl. No reflux or dyspepsia.   Review of Systems  She reports fatigue. No chest pain or pressure. No fever or chills. She does have chest tightness with her cough. No rashes or bruising.   Allergies  Allergen Reactions  . Latex Rash  . Adhesive [Tape] Other (See Comments)    TEARS SKIN  . Bupivacaine Other (See Comments)    Other Reaction: PRE-SYNCOPE, TACTYCARDIA TACHYCARDIA/ PRESYNCOPE  . Butorphanol Tartrate Other (See Comments)    Other Reaction: NUMBNESS, TACHYCARDIA  . Clarithromycin Nausea Only and Other (See Comments)    TACHYCARDIA  . Codeine Other (See Comments)    SEVERE ELEVATED LIVER ENZYMES  . Cymbalta [Duloxetine Hcl] Other (See Comments)    Blurred vision  . Dilaudid [Hydromorphone Hcl] Itching  . Elavil [Amitriptyline Hcl] Other (See Comments)    Elevated liver enzymes  . Hydrocodone Other (See Comments)    Elevated liver enzymes  . Hyoscyamine Sulfate Other (See Comments)  . Lactated Ringers Other (See Comments)    COULD CAUSE PANIC ATTACK  . Levbid [Hyoscyamine Sulfate] Other (See Comments)    Dizziness, nausea, headache  . Lisinopril Other (See Comments)    Dry cough  . Marcaine [Bupivacaine Hcl] Other (See Comments)    TACHYCARDIA,  PRESYNCOPE  . Pentazocine Lactate Other (See Comments)    tachycardia  . Percodan [Oxycodone-Aspirin] Nausea Only  . Stadol [Butorphanol Tartrate] Swelling    Lips numb, tongue swell, tachycardia  . Trazodone And Nefazodone Other (See Comments)    tachycardia  . Vistaril [Hydroxyzine Hcl] Other (See Comments)    Altered sensorium  . Morphine And Related Itching and Rash    Current Outpatient Prescriptions on File Prior to Visit  Medication Sig Dispense Refill  . albuterol (VENTOLIN HFA) 108 (90 Base) MCG/ACT inhaler Inhale into the lungs every 6 (six) hours as needed for wheezing or shortness of breath.    . budesonide-formoterol (SYMBICORT) 160-4.5 MCG/ACT inhaler Inhale 2 puffs into the lungs 2 (two) times daily. 1 Inhaler 3  . Calcium Carbonate-Vitamin D (CALCIUM + D PO) Take by mouth daily. 1 tablet daily    . estradiol (VIVELLE-DOT) 0.1 MG/24HR Place 1 patch onto the skin 2 (two) times a week.    . ezetimibe (ZETIA) 10 MG tablet Take 10 mg by mouth daily.     . fish oil-omega-3 fatty acids 1000 MG capsule Take 1 g by mouth daily.     . fluticasone (FLONASE) 50 MCG/ACT nasal spray Place 2 sprays into the nose daily.    . Fluticasone Furoate-Vilanterol (BREO ELLIPTA IN) Inhale 1 puff into the lungs daily.    . folic acid (FOLVITE) 1 MG tablet Take 1 mg by mouth daily.    Marland Kitchen LORazepam (ATIVAN) 1 MG tablet 1/2 to 1 tab twice daily    . metoprolol (LOPRESSOR)  50 MG tablet Take 25 mg by mouth 2 (two) times daily.     . montelukast (SINGULAIR) 10 MG tablet Take 10 mg by mouth at bedtime.    Marland Kitchen olmesartan (BENICAR) 20 MG tablet Take 20 mg by mouth daily.    . pantoprazole (PROTONIX) 40 MG tablet Take 40 mg by mouth daily.    . ranitidine (ZANTAC) 150 MG tablet Take 1 tablet (150 mg total) by mouth at bedtime. 30 tablet 3  . sertraline (ZOLOFT) 50 MG tablet Take 50 mg by mouth daily.    Marland Kitchen Spacer/Aero-Holding Chambers (AEROCHAMBER MV) inhaler Use as instructed 1 each 0  . Vitamin D,  Ergocalciferol, (DRISDOL) 50000 UNITS CAPS Take 50,000 Units by mouth every 7 (seven) days.      Marland Kitchen alosetron (LOTRONEX) 0.5 MG tablet Take 1 tablet (0.5 mg total) by mouth 2 (two) times daily. (Patient not taking: Reported on 07/06/2016) 60 tablet 1  . BIOTIN PO Take by mouth.    . Multiple Vitamin (MULTIVITAMIN) tablet Take 1 tablet by mouth daily.    . predniSONE (DELTASONE) 10 MG tablet 4 tabs for 2 days, 3 tabs for 2 days, 2 tabs for 2 days 1 tab for 2 days then stop. (Patient not taking: Reported on 07/06/2016) 20 tablet 0   Current Facility-Administered Medications on File Prior to Visit  Medication Dose Route Frequency Provider Last Rate Last Dose  . 0.9 %  sodium chloride infusion  500 mL Intravenous Continuous Gatha Mayer, MD        Past Medical History:  Diagnosis Date  . Allergic rhinitis   . Allergy   . Anal fissure   . Anemia   . Anxiety   . Arthritis   . Asthma   . Bladder pain   . Complication of anesthesia PAIN ATTACKS   LACTATED RINGERS- CONTRAINDICATED  . Depression   . Fibromyalgia   . Frequency of urination   . GERD (gastroesophageal reflux disease)   . Heart murmur   . History of pancreatitis 1988-1989  . Hyperlipidemia   . Hypertension LABILE  . IBS (irritable bowel syndrome)   . Interstitial cystitis   . Migraines   . Normal cardiac stress test JULY 2009---  NORMAL  . 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 Spasm of sphincter of Oddi 1987  . Stroke ([place])   . Urethral stenosis S/P DILATIONS  . Urgency of urination     Past Surgical History:  Procedure Laterality Date  . ABDOMINAL HYSTERECTOMY  1988  . APPENDECTOMY  04/ 1986  . bilateral reduction mastopexy  1988  . bilateral turbinate resection  1990   nasal spreading graft  . CATARACT EXTRACTION, BILATERAL    . CHOLECYSTECTOMY  04/ 1986  . COLONOSCOPY  02/2004, 11/2010   2005: Normal - Dr. Sammuel Cooper 2012: Normal  . CYSTO  WITH HYDRODISTENSION N/A 12/17/2013   Procedure: CYSTO HYDRODISTENSION WITH INSTALLATION OF CHLOROPACTIN AND TETRACAINE;  Surgeon: Malka So, MD;  Location: [facility];  Service: Urology;  Laterality: N/A;  . CYSTO WITH HYDRODISTENSION N/A 06/02/2015   Procedure: CYSTOSCOPY/HYDRODISTENSION WITH INSTILLATION OF CLORPACTIN ;  Surgeon: [name], MD;  Location: [facility];  Service: Urology;  Laterality: N/A;  . CYSTOSCOPY WITH URETHRAL DILATATION  07/12/2011   Procedure: CYSTOSCOPY WITH URETHRAL DILATATION;  Surgeon: Malka So, MD;  Location: [facility];  Service: Urology;  Laterality: N/A;  HOD AND INSTILLATION OF CHLOROPACTIN C-ARM   . CYSTOSCOPY WITH URETHRAL DILATATION N/A 06/26/2012   Procedure: CYSTOSCOPY WITH URETHRAL DILATATION HYDRODISTENSION AND INSTILLATION OF CLORPACTION ;  Surgeon: Malka So, MD;  Location[facility];  Service: Urology;  Laterality: N/A;  CYSTOSCOPY WITH URETHRAL DILATATION HYDRODISTENSION AND INSTILLATION OF CLORPACTION   . CYSTOSTOMY W/ BLADDER BIOPSY  1983  . DILATION AND CURETTAGE OF UTERUS  1987  . ERCP  1988   for pancreatitis with stents   S/P SEVERE PANCREATITIS  . HEMORRHOID SURGERY    . LASIK Bilateral APRIL 2005  . MULTIPLE CYSTO/ HOD/ URETHRAL DILATION/ INSTILLATION CLORPACTIN  .LAST ONE 03-31-2010  . NASAL SINUS SURGERY  1977   sinus cyst  . REDUCTION MAMMAPLASTY Bilateral 1988  . revision rhinoplasty  1978  . RHINOPLASTY  1977  . RIGHT SALPINGOOPHECTOMY  1998  . TONSILLECTOMY AND ADENOIDECTOMY  1963    Family History  Problem Relation Age of Onset  . Colon cancer Maternal Grandmother   . Colon cancer Cousin   . Allergies Mother   . Asthma Mother   . Heart disease Mother   . Osteoarthritis Mother   . Diabetes Mother   . Irritable bowel syndrome Mother   . Brain cancer Father   . Osteoarthritis Father   . Colon polyps Father     adenomatous  . Heart disease Father   .  Melanoma Sister   . Osteoarthritis Sister   . Irritable bowel syndrome Sister   . Diabetes Sister   . Heart disease Sister   . Stroke Sister   . Ulcerative colitis Maternal Uncle   . Stomach cancer Neg Hx   . Rheumatologic disease Neg Hx     Social History   Social History  . Marital status: Married    Spouse name: N/A  . Number of children: 0  . Years of education: N/A   Occupational History  . RN    Social History Main Topics  . Smoking status: Passive Smoke Exposure - Never Smoker  . Smokeless tobacco: Never Used     Comment: Father smoked briefly  . Alcohol use No  . Drug use: No  . Sexual activity: Not Asked   Other Topics Concern  . None   Social History Narrative   Married, lives with spouse   No children   RN at [employer]   No recent travel      [facility]:   Originally from [place]. Always lived in [place]. Previously has traveled to [place], [place], [place], [place], & mostly [place]. No indoor pets currently. She does have cats in her garage. No bird, mold, or hot tub exposure. No indoor plants. Mostly wood floors. She did have her bedroom carpet taken up in Summer 2017. Has mostly blinds. No wood burning fire place. Previously enjoyed Firefighter & playing piano.       Objective:   Physical Exam BP 118/80 (BP Location: Right Arm, Patient Position: Sitting, Cuff Size: Normal)   Pulse 95   Ht 5' (1.524 m)   Wt 142 lb 12.8 oz (64.8 kg)   SpO2 100%   BMI 27.89 kg/m   Gen.: No distress. Comfortable. Alert. Neck slight integument: No rash or bruising on exposed skin. Warm and dry. HEENT: Mild bilateral nasal turbinate swelling. No oral ulcers. Moist mucous membranes. Cardiovascular: Regular rate and rhythm. No edema or JVD appreciated. Pulmonary: Upper airway referred wheezing  again appreciated. Normal work of breathing. Continues to have intermittent nonproductive cough. Abdomen: Soft. Nondistended. Normal bowel sounds.  PFT 04/18/16: FVC 2.06  L (76%) FEV1 1.84 L (80%) FEV1/FVC 0.89 FEF 25-75 3.03 L (156%) negative bronchodilator response TLC 43 L (108%) RV 139% ERV 25% DLCO uncorrected 67% 10/20/13: FVC 2.14 L (86%) FEV1 1.98 L (92%) FEV1/FVC 0.82 FEF 25-75 2.14 L (104%) negative bronchodilator response TLC 3.73 L (83%) RV 70% ERV 65% DLCO uncorrected 92%  IMAGING CXR PA/LAT 06/07/16 (personally reviewed by me):  No focal opacity or mass appreciated. No pleural effusion. Heart normal in size & mediastinum normal in contour.  MAXILLOFACIAL CT LTD W/O 04/19/16 (per radiologist): Visualized paranasal sinuses are essentially clear.  BARIUM SWALLOW/ESOPHAGRAM 04/16/16 (per radiologist):  Mild gastroesophageal reflux. No stricture.   CT CHEST W/O 03/28/15 (previously reviewed by me):  No parenchymal nodule or opacity appreciated. No pleural effusion or thickening. No pericardial effusion. No pathologic mediastinal adenopathy.  MICROBIOLOGY Sputum Culture (06/14/16):  Normal Oral Flora / AFB pending / Fungus pending Sputum Culture (04/19/16):  Scant Yeast not identified / AFB negative / Normal Oral Flora Sputum Culture (05/04/16):  Normal Oral Flora  LABS 04/18/16 CBC: 9.9/13.8/40.0/431 IgM: 129 IgA: 139 IgG: 587 IgE: <2 RAST panel: Negative  06/02/15 HGB:  15.3    Assessment & Plan:  66 y.o. female with underlying moderate, persistent asthma, GERD, and chronic seasonal allergic rhinitis. Ongoing cough is likely multifactorial. There is no parenchymal abnormality on her previous x-ray by my review but this does not preclude the possibility of a process that may be beyond the resolution of x-ray imaging. We did discuss bronchoscopy but I would prefer to perform CT imaging of the chest to help guide me before doing the procedure if absolutely necessary. Overall her reflux seems to be well-controlled and without symptomatic improvement with regards to her asthma I do not feel that continuing inhaler therapies as necessary at this time.  She'll contact my office if she has any new problems or questions before her next appointment as planned.  1. Cough:  May require CT chest & bronchoscopy if symptoms don't improve. 2. Moderate, persistent asthma: Discontinuing inhalers. Continuing Singulair. 3. Chronic seasonal allergic rhinitis:  Continuing Singulair. Switching from Flonase to Dymista.  4. GERD:  Continuing Zantac & Protonix.  5. Health maintenance: Status post Pneumovax October 2014 & influenza vaccine February 2018. Administering Prevnar vaccine today. 6. Follow-up: Return to clinic in 4 weeks.   Sonia Baller Ashok Cordia, M.D. [facility] Pager:  [phone] After 3pm or if no response, call [phone] 9:25 AM 07/06/16

## 2016-07-06 NOTE — Patient Instructions (Signed)
   Stop using your Breo & Symbicort inhalers to see if it makes a difference.  Continue using your Singulair.  Use the Dymista sample I'm giving you today in place of your Flonase. Start with 1 spray in each nostril once daily and you can go up to 1 spray in each nostril twice daily if this helps.  Call me for a prescription if this helps. If it doesn't then let me know and we'll schedule a CT of your chest and bronchosocpy.

## 2016-07-09 ENCOUNTER — Encounter (INDEPENDENT_AMBULATORY_CARE_PROVIDER_SITE_OTHER): Payer: Self-pay | Admitting: Orthopaedic Surgery

## 2016-07-09 ENCOUNTER — Ambulatory Visit (INDEPENDENT_AMBULATORY_CARE_PROVIDER_SITE_OTHER): Payer: Worker's Compensation | Admitting: Orthopaedic Surgery

## 2016-07-09 VITALS — BP 118/84 | HR 85 | Resp 14 | Ht 60.0 in | Wt 140.0 lb

## 2016-07-09 DIAGNOSIS — M25562 Pain in left knee: Secondary | ICD-10-CM | POA: Diagnosis not present

## 2016-07-09 NOTE — Progress Notes (Signed)
Office Visit Note   Patient: Brenda [name]           Date of Birth: May 24, 1950           MRN: [id number] Visit Date: 07/09/2016              Requested by: Prince Solian, MD [address] [place], [place] [address] PCP: Tivis Ringer, MD   Assessment & Plan: Visit Diagnoses: Workman's comp injury left elbow and left knee. Presently doing well . Contusion left knee healing without problem  Plan: Office 2 weeks, continue to work  Follow-Up Instructions: No Follow-up on file.   Orders:  No orders of the defined types were placed in this encounter.  No orders of the defined types were placed in this encounter.     Procedures: No procedures performed   Clinical Data: No additional findings.   Subjective: No chief complaint on file.   Pt presents with chronic left knee that hurts behind the left knee. She relates she rested the knee this weekend but worked all last week and the pain returned with "popping and cracking".  [name] is approximately 11 days post on-the-job injury where she fell landing directly on her left knee and outstretched left upper extremity. He apparently of contused her left elbow which is now nearly resolved out any loss of function. She continues to work. Aaron Edelman saw her and applied a knee immobilizer to the left knee and she no longer uses it but still having some discomfort. I did review her films and did not see any evidence of a fracture.  Review of Systems   Objective: Vital Signs: There were no vitals taken for this visit.  Physical Exam  Ortho Exam left elbow exam with full flexion extension pronation and supination neurovascular exam is intact. No skin changes  Left knee with some mild induration along the inferior pole of the patella. Skin otherwise intact. No effusion. No instability. Full extension and flexion over 110. No calf pain. No swelling distally. Neurovascular exam intact  Specialty Comments:  No specialty comments  available.  Imaging: No results found.   PMFS History: Patient Active Problem List   Diagnosis Date Noted  . Acute maxillary sinusitis 04/18/2016  . Moderate persistent asthma 03/19/2016  . Chronic seasonal allergic rhinitis 03/19/2016  . GERD (gastroesophageal reflux disease) 03/19/2016  . Cough 03/19/2016  . Essential hypertension, benign 03/19/2016  . H/O: CVA (cerebrovascular accident) 03/19/2016  . IBS (irritable bowel syndrome) 03/19/2016  . Interstitial cystitis 03/19/2016  . Fibromyalgia 03/19/2016  . DJD (degenerative joint disease) 03/19/2016  . Arthritis 03/19/2016  . Sphincter of Oddi dysfunction 03/19/2016  . H/O acute pancreatitis 03/19/2016   Past Medical History:  Diagnosis Date  . Allergic rhinitis   . Allergy   . Anal fissure   . Anemia   . Anxiety   . Arthritis   . Asthma   . Bladder pain   . Complication of anesthesia PAIN ATTACKS   LACTATED RINGERS- CONTRAINDICATED  . Depression   . Fibromyalgia   . Frequency of urination   . GERD (gastroesophageal reflux disease)   . Heart murmur   . History of pancreatitis 1988-1989  . Hyperlipidemia   . Hypertension LABILE  . IBS (irritable bowel syndrome)   . Interstitial cystitis   . Migraines   . Normal cardiac stress test JULY 2009---  NORMAL  . 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 Spasm of sphincter of Oddi 1987  . Stroke ([place])   . Urethral stenosis S/P DILATIONS  . Urgency of urination     Family History  Problem Relation Age of Onset  . Colon cancer Maternal Grandmother   . Colon cancer Cousin   . Allergies Mother   . Asthma Mother   . Heart disease Mother   . Osteoarthritis Mother   . Diabetes Mother   . Irritable bowel syndrome Mother   . Brain cancer Father   . Osteoarthritis Father   . Colon polyps Father     adenomatous  . Heart disease Father   . Melanoma Sister   . Osteoarthritis Sister   .  Irritable bowel syndrome Sister   . Diabetes Sister   . Heart disease Sister   . Stroke Sister   . Ulcerative colitis Maternal Uncle   . Stomach cancer Neg Hx   . Rheumatologic disease Neg Hx     Past Surgical History:  Procedure Laterality Date  . ABDOMINAL HYSTERECTOMY  1988  . APPENDECTOMY  04/ 1986  . bilateral reduction mastopexy  1988  . bilateral turbinate resection  1990   nasal spreading graft  . CATARACT EXTRACTION, BILATERAL    . CHOLECYSTECTOMY  04/ 1986  . COLONOSCOPY  02/2004, 11/2010   2005: Normal - Dr. Sammuel Cooper 2012: Normal  . CYSTO WITH HYDRODISTENSION N/A 12/17/2013   Procedure: CYSTO HYDRODISTENSION WITH INSTALLATION OF CHLOROPACTIN AND TETRACAINE;  Surgeon: Malka So, MD;  [facility];  Service: Urology;  Laterality: N/A;  . CYSTO WITH HYDRODISTENSION N/A 06/02/2015   Procedure: CYSTOSCOPY/HYDRODISTENSION WITH INSTILLATION OF CLORPACTIN ;  Surgeon: [name], MD;  Location: [facility];  Service: Urology;  Laterality: N/A;  . CYSTOSCOPY WITH URETHRAL DILATATION  07/12/2011   Procedure: CYSTOSCOPY WITH URETHRAL DILATATION;  Surgeon: Malka So, MD;  Location: [facility];  Service: Urology;  Laterality: N/A;  HOD AND INSTILLATION OF CHLOROPACTIN C-ARM   . CYSTOSCOPY WITH URETHRAL DILATATION N/A 06/26/2012   Procedure: CYSTOSCOPY WITH URETHRAL DILATATION HYDRODISTENSION AND INSTILLATION OF CLORPACTION ;  Surgeon: Malka So, MD;  Location[facility];  Service: Urology;  Laterality: N/A;  CYSTOSCOPY WITH URETHRAL DILATATION HYDRODISTENSION AND INSTILLATION OF CLORPACTION   . CYSTOSTOMY W/ BLADDER BIOPSY  1983  . DILATION AND CURETTAGE OF UTERUS  1987  . ERCP  1988   for pancreatitis with stents   S/P SEVERE PANCREATITIS  . HEMORRHOID SURGERY    . LASIK Bilateral APRIL 2005  . MULTIPLE CYSTO/ HOD/ URETHRAL DILATION/ INSTILLATION CLORPACTIN  .LAST ONE 03-31-2010  . NASAL SINUS SURGERY  1977     sinus cyst  . REDUCTION MAMMAPLASTY Bilateral 1988  . revision rhinoplasty  1978  . RHINOPLASTY  1977  . RIGHT SALPINGOOPHECTOMY  1998  . TONSILLECTOMY AND ADENOIDECTOMY  1963   Social History   Occupational History  . RN    Social History Main Topics  . Smoking status: Passive Smoke Exposure - Never Smoker  . Smokeless tobacco: Never Used     Comment: Father smoked briefly  . Alcohol use No  . Drug use: No  . Sexual activity: Not on file

## 2016-07-10 ENCOUNTER — Telehealth: Payer: Self-pay | Admitting: Pulmonary Disease

## 2016-07-10 DIAGNOSIS — R059 Cough, unspecified: Secondary | ICD-10-CM

## 2016-07-10 DIAGNOSIS — R05 Cough: Secondary | ICD-10-CM

## 2016-07-10 NOTE — Telephone Encounter (Signed)
[phone] pt calling back

## 2016-07-10 NOTE — Telephone Encounter (Signed)
lmtcb x1 for pt. 

## 2016-07-10 NOTE — Telephone Encounter (Signed)
lmtcb X1 for pt on work #

## 2016-07-11 NOTE — Telephone Encounter (Signed)
Called and spoke to pt. Pt states the Dymista helped her sinus congestion but has not helped with her chest congestion. Pt still c/o prod cough with yellow mucus. Pt was told at last OV with JN to update him regarding medication.  Dr. Ashok Cordia please advise on CT chest and bronch. Thanks.   OV from 3.9.18 -  Stop using your Breo & Symbicort inhalers to see if it makes a difference. Continue using your Singulair. Use the Dymista sample I'm giving you today in place of your Flonase. Start with 1 spray in each nostril once daily and you can go up to 1 spray in each nostril twice daily if this helps. Call me for a prescription if this helps. If it doesn't then let me know and we'll schedule a CT of your chest and bronchosocpy

## 2016-07-11 NOTE — Telephone Encounter (Signed)
lmtcb X1 for pt to make aware of recs.  Will order ct chest after speaking to pt.

## 2016-07-11 NOTE — Telephone Encounter (Signed)
Please go ahead and order a CT Chest w/o contrast for her persistent cough. Thanks.

## 2016-07-12 NOTE — Telephone Encounter (Signed)
lmomtcb x1 

## 2016-07-12 NOTE — Telephone Encounter (Signed)
Pt returning call and can be reached [phone] or cell [phone].Brenda [name]

## 2016-07-13 NOTE — Telephone Encounter (Signed)
Called and spoke with pt and she is aware of CT order placed.  Pt would like this done after work--after 5:30 during the week.  She is aware that this has been placed with the order and we will call her back with date and time of ct.

## 2016-07-16 LAB — FUNGUS CULTURE W SMEAR

## 2016-07-17 ENCOUNTER — Ambulatory Visit
Admission: RE | Admit: 2016-07-17 | Discharge: 2016-07-17 | Disposition: A | Discharge status: Home / Self Care | Payer: PRIVATE HEALTH INSURANCE | Source: Ambulatory Visit | Attending: Pulmonary Disease | Admitting: Pulmonary Disease

## 2016-07-17 DIAGNOSIS — R05 Cough: Secondary | ICD-10-CM

## 2016-07-17 DIAGNOSIS — R059 Cough, unspecified: Secondary | ICD-10-CM

## 2016-07-18 ENCOUNTER — Ambulatory Visit (INDEPENDENT_AMBULATORY_CARE_PROVIDER_SITE_OTHER): Payer: Worker's Compensation | Admitting: Orthopedic Surgery

## 2016-07-18 ENCOUNTER — Encounter (INDEPENDENT_AMBULATORY_CARE_PROVIDER_SITE_OTHER): Payer: Self-pay | Admitting: Orthopedic Surgery

## 2016-07-18 ENCOUNTER — Telehealth (INDEPENDENT_AMBULATORY_CARE_PROVIDER_SITE_OTHER): Payer: Self-pay | Admitting: *Deleted

## 2016-07-18 VITALS — BP 115/74 | HR 79 | Ht 60.0 in | Wt 144.0 lb

## 2016-07-18 DIAGNOSIS — M25562 Pain in left knee: Secondary | ICD-10-CM

## 2016-07-18 NOTE — Progress Notes (Signed)
Office Visit Note   Patient: Brenda [name]           Date of Birth: [birth year]           MRN: [id number] Visit Date: 07/18/2016              Requested by: Prince Solian, MD [address] [place], [place] [address] PCP: Tivis Ringer, MD   Assessment & Plan: Visit Diagnoses:  1. Acute pain of left knee     Plan:  #1: MRI scan of the left knee to rule out internal derangement  Follow-Up Instructions: Return in about 2 weeks (around 08/01/2016), or if symptoms worsen or fail to improve.   Orders:  Orders Placed This Encounter  Procedures  . MR Knee Left w/o contrast   No orders of the defined types were placed in this encounter.     Procedures: No procedures performed   Clinical Data: No additional findings.   Subjective: Chief Complaint  Patient presents with  . Left Knee - Pain    [name] is a 66 year old white female employee of [employer] who presented as a worker's comp case on 06/21/2016 for evaluation of her right knee. She she was walking down the hall carrying supplies in her hands when she tripped and fell to the right striking her right shoulder on the wall and then fell to her left knee and struck her right elbow. This occurred at work and she placed a bag of ice on her knee. She was seen that day and placed into a long knee immobilizer.  She returned 07/09/2016 and was noted to have some pain in the left knee but did have some improvement. I discontinued her knee immobilizer. She was staying with conservative treatment. However she returns today with increasing pain discomfort especially with pain in the posterior aspect of her knee also.  This has been worsening. She returns today for follow-up and reevaluation.    Review of Systems  Constitutional:       History of fibromyalgia  Genitourinary:       History of irritable bowel syndrome and spasms of sphincter of ODI.   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    Objective: Vital Signs: BP 115/74   Pulse 79   Ht 5' (1.524 m)   Wt 144 lb (65.3 kg)   BMI 28.12 kg/m   Physical Exam  Constitutional: She is oriented to person, place, and time. She appears well-developed and well-nourished.  HENT:  Head: Normocephalic and atraumatic.  Eyes: EOM are normal. Pupils are equal, round, and reactive to light.  Pulmonary/Chest: Effort normal.  Neurological: She is alert and oriented to person, place, and time.  Skin: Skin is warm and dry.  Psychiatric: She has a normal mood and affect. Her behavior is normal. Judgment and thought content normal.    Ortho Exam Left knee reveals from near 0 to 105. Avenue a little bit of an effusion today. No warmth. Testing today reveals possibly a little bit of a posterior drawer. Some tenderness to palpation and posteriorly with some fullness possibly that of the Baker's cyst  Specialty Comments:  No specialty comments available.     PMFS History: Patient Active Problem List   Diagnosis Date Noted  . Acute maxillary sinusitis 04/18/2016  . Moderate persistent asthma 03/19/2016  . Chronic seasonal allergic rhinitis 03/19/2016  .  GERD (gastroesophageal reflux disease) 03/19/2016  . Cough 03/19/2016  . Essential hypertension, benign 03/19/2016  . H/O: CVA (cerebrovascular accident) 03/19/2016  . IBS (irritable bowel syndrome) 03/19/2016  . Interstitial cystitis 03/19/2016  . Fibromyalgia 03/19/2016  . DJD (degenerative joint disease) 03/19/2016  . Arthritis 03/19/2016  . Sphincter of Oddi dysfunction 03/19/2016  . H/O acute pancreatitis 03/19/2016   Past Medical History:  Diagnosis Date  . Allergic rhinitis   . Allergy   . Anal fissure   . Anemia   . Anxiety   . Arthritis   . Asthma   . Bladder pain   . Complication of anesthesia PAIN ATTACKS   LACTATED RINGERS- CONTRAINDICATED  . Depression   . Fibromyalgia   . Frequency of  urination   . GERD (gastroesophageal reflux disease)   . Heart murmur   . History of pancreatitis 1988-1989  . Hyperlipidemia   . Hypertension LABILE  . IBS (irritable bowel syndrome)   . Interstitial cystitis   . Migraines   . Normal cardiac stress test JULY 2009---  NORMAL  . 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 Spasm of sphincter of Oddi 1987  . Stroke ([place])   . Urethral stenosis S/P DILATIONS  . Urgency of urination     Family History  Problem Relation Age of Onset  . Colon cancer Maternal Grandmother   . Colon cancer Cousin   . Allergies Mother   . Asthma Mother   . Heart disease Mother   . Osteoarthritis Mother   . Diabetes Mother   . Irritable bowel syndrome Mother   . Brain cancer Father   . Osteoarthritis Father   . Colon polyps Father     adenomatous  . Heart disease Father   . Melanoma Sister   . Osteoarthritis Sister   . Irritable bowel syndrome Sister   . Diabetes Sister   . Heart disease Sister   . Stroke Sister   . Ulcerative colitis Maternal Uncle   . Stomach cancer Neg Hx   . Rheumatologic disease Neg Hx     Past Surgical History:  Procedure Laterality Date  . ABDOMINAL HYSTERECTOMY  1988  . APPENDECTOMY  04/ 1986  . bilateral reduction mastopexy  1988  . bilateral turbinate resection  1990   nasal spreading graft  . CATARACT EXTRACTION, BILATERAL    . CHOLECYSTECTOMY  04/ 1986  . COLONOSCOPY  02/2004, 11/2010   2005: Normal - Dr. Sammuel Cooper 2012: Normal  . CYSTO WITH HYDRODISTENSION N/A 12/17/2013   Procedure: CYSTO HYDRODISTENSION WITH INSTALLATION OF CHLOROPACTIN AND TETRACAINE;  Surgeon: Malka So, MD;  Location: [facility];  Service: Urology;  Laterality: N/A;  . CYSTO WITH HYDRODISTENSION N/A 06/02/2015   Procedure: CYSTOSCOPY/HYDRODISTENSION WITH INSTILLATION OF CLORPACTIN ;  Surgeon: [name], MD;  Location: [facility];  Service:  Urology;  Laterality: N/A;  . CYSTOSCOPY WITH URETHRAL DILATATION  07/12/2011   Procedure: CYSTOSCOPY WITH URETHRAL DILATATION;  Surgeon: Malka So, MD;  Location: [facility];  Service: Urology;  Laterality: N/A;  HOD AND INSTILLATION OF CHLOROPACTIN C-ARM   . CYSTOSCOPY WITH URETHRAL DILATATION N/A 06/26/2012   Procedure: CYSTOSCOPY WITH URETHRAL DILATATION HYDRODISTENSION AND INSTILLATION OF CLORPACTION ;  Surgeon: Malka So, MD;  Location: [facility];  Service: Urology;  Laterality: N/A;  CYSTOSCOPY WITH URETHRAL DILATATION HYDRODISTENSION AND  INSTILLATION OF CLORPACTION   . CYSTOSTOMY W/ BLADDER BIOPSY  1983  . DILATION AND CURETTAGE OF UTERUS  1987  . ERCP  1988   for pancreatitis with stents   S/P SEVERE PANCREATITIS  . HEMORRHOID SURGERY    . LASIK Bilateral APRIL 2005  . MULTIPLE CYSTO/ HOD/ URETHRAL DILATION/ INSTILLATION CLORPACTIN  .LAST ONE 03-31-2010  . NASAL SINUS SURGERY  1977   sinus cyst  . REDUCTION MAMMAPLASTY Bilateral 1988  . revision rhinoplasty  1978  . RHINOPLASTY  1977  . RIGHT SALPINGOOPHECTOMY  1998  . TONSILLECTOMY AND ADENOIDECTOMY  1963   Social History   Occupational History  . RN    Social History Main Topics  . Smoking status: Passive Smoke Exposure - Never Smoker  . Smokeless tobacco: Never Used     Comment: Father smoked briefly  . Alcohol use No  . Drug use: No  . Sexual activity: Not on file

## 2016-07-19 NOTE — Telephone Encounter (Signed)
Aaron Edelman ordering MRI for knee worker's comp. Thank you.

## 2016-07-23 ENCOUNTER — Ambulatory Visit (INDEPENDENT_AMBULATORY_CARE_PROVIDER_SITE_OTHER): Payer: Worker's Compensation | Admitting: Orthopaedic Surgery

## 2016-07-23 ENCOUNTER — Encounter (INDEPENDENT_AMBULATORY_CARE_PROVIDER_SITE_OTHER): Payer: Self-pay | Admitting: Orthopaedic Surgery

## 2016-07-23 VITALS — BP 118/78 | HR 92 | Resp 14 | Ht 62.0 in | Wt 140.0 lb

## 2016-07-23 DIAGNOSIS — M25562 Pain in left knee: Secondary | ICD-10-CM | POA: Diagnosis not present

## 2016-07-23 NOTE — Progress Notes (Signed)
Office Visit Note   Patient: Brenda [name]           Date of Birth: 1950-11-11           MRN: [id number] Visit Date: 07/23/2016              Requested by: Prince Solian, MD [address] [place],  [address] PCP: Tivis Ringer, MD   Assessment & Plan: Visit Diagnoses: Workman's comp injury left knee. RI scan was performed 323 demonstrating a grade 1 degenerative signal in the posterior horn of the medial meniscus without a tear. Lateral meniscus was unremarkable. Probable tear of PCL. Anterior cruciate ligament intact. Mild strain of the MCL. Mild to moderate chondral thinning in all 3 compartments. Small Baker's cyst that possibly ruptured or leaks. It is better over the past several weeks but still having some discomfort.  Plan: I think time will heal her injuries to the left knee. I would like to apply a spider brace which should give her good external support. Office 1 month.  Follow-Up Instructions: No Follow-up on file.   Orders:  No orders of the defined types were placed in this encounter.  No orders of the defined types were placed in this encounter.     Procedures: No procedures performed   Clinical Data: No additional findings.   Subjective: Chief Complaint  Patient presents with  . Left Knee - Results, Pain    Brenda [name] is a 66 year old female Workman's Comp patient that has left knee pai. She had an MRI of her left knee at [facility]. She brought a CD with the images, no paperwork.  Brenda [name] is over a month status post left knee injury as previously outlined. She had an MRI scan performed Friday. Results are listed above. She definitely is better but does have some trouble as the day progresses with knee feeling tight with less of a limp. Still having some trouble with her left shoulder which we may evaluate further in the next month if no improvement.  Review of Systems   Objective: Vital Signs: There were no vitals taken for this  visit.  Physical Exam  Ortho Exam left knee exam without effusion. No opening with a varus or valgus stress. Slightly increased anterior and posterior drawer sign compared to the right knee. Some mild popliteal pain. I did not feel a popliteal cyst. No calf pain or swelling. No distal edema. Neurovascular exam intact. Full extension with flexion over 105 negative Lachman's test.  Specialty Comments:  No specialty comments available.  Imaging: No results found.   PMFS History: Patient Active Problem List   Diagnosis Date Noted  . Acute maxillary sinusitis 04/18/2016  . Moderate persistent asthma 03/19/2016  . Chronic seasonal allergic rhinitis 03/19/2016  . GERD (gastroesophageal reflux disease) 03/19/2016  . Cough 03/19/2016  . Essential hypertension, benign 03/19/2016  . H/O: CVA (cerebrovascular accident) 03/19/2016  . IBS (irritable bowel syndrome) 03/19/2016  . Interstitial cystitis 03/19/2016  . Fibromyalgia 03/19/2016  . DJD (degenerative joint disease) 03/19/2016  . Arthritis 03/19/2016  . Sphincter of Oddi dysfunction 03/19/2016  . H/O acute pancreatitis 03/19/2016   Past Medical History:  Diagnosis Date  . Allergic rhinitis   . Allergy   . Anal fissure   . Anemia   . Anxiety   . Arthritis   . Asthma   . Bladder pain   . Complication of anesthesia PAIN ATTACKS   LACTATED RINGERS- CONTRAINDICATED  . Depression   .  Fibromyalgia   . Frequency of urination   . GERD (gastroesophageal reflux disease)   . Heart murmur   . History of pancreatitis 1988-1989  . Hyperlipidemia   . Hypertension LABILE  . IBS (irritable bowel syndrome)   . Interstitial cystitis   . Migraines   . Normal cardiac stress test JULY 2009---  NORMAL  . 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 Spasm of sphincter of Oddi 1987  . Stroke ([place])   . Urethral stenosis S/P DILATIONS  . Urgency of urination     Family  History  Problem Relation Age of Onset  . Colon cancer Maternal Grandmother   . Colon cancer Cousin   . Allergies Mother   . Asthma Mother   . Heart disease Mother   . Osteoarthritis Mother   . Diabetes Mother   . Irritable bowel syndrome Mother   . Brain cancer Father   . Osteoarthritis Father   . Colon polyps Father     adenomatous  . Heart disease Father   . Melanoma Sister   . Osteoarthritis Sister   . Irritable bowel syndrome Sister   . Diabetes Sister   . Heart disease Sister   . Stroke Sister   . Ulcerative colitis Maternal Uncle   . Stomach cancer Neg Hx   . Rheumatologic disease Neg Hx     Past Surgical History:  Procedure Laterality Date  . ABDOMINAL HYSTERECTOMY  1988  . APPENDECTOMY  04/ 1986  . bilateral reduction mastopexy  1988  . bilateral turbinate resection  1990   nasal spreading graft  . CATARACT EXTRACTION, BILATERAL    . CHOLECYSTECTOMY  04/ 1986  . COLONOSCOPY  02/2004, 11/2010   2005: Normal - Dr. Sammuel Cooper 2012: Normal  . CYSTO WITH HYDRODISTENSION N/A 12/17/2013   Procedure: CYSTO HYDRODISTENSION WITH INSTALLATION OF CHLOROPACTIN AND TETRACAINE;  Surgeon: Malka So, MD;  Location: [facility];  Service: Urology;  Laterality: N/A;  . CYSTO WITH HYDRODISTENSION N/A 06/02/2015   Procedure: CYSTOSCOPY/HYDRODISTENSION WITH INSTILLATION OF CLORPACTIN ;  Surgeon: [name], MD;  Location: [facility];  Service: Urology;  Laterality: N/A;  . CYSTOSCOPY WITH URETHRAL DILATATION  07/12/2011   Procedure: CYSTOSCOPY WITH URETHRAL DILATATION;  Surgeon: Malka So, MD;  Location: [facility];  Service: Urology;  Laterality: N/A;  HOD AND INSTILLATION OF CHLOROPACTIN C-ARM   . CYSTOSCOPY WITH URETHRAL DILATATION N/A 06/26/2012   Procedure: CYSTOSCOPY WITH URETHRAL DILATATION HYDRODISTENSION AND INSTILLATION OF CLORPACTION ;  Surgeon: Malka So, MD;  Location: [facility];  Service: Urology;   Laterality: N/A;  CYSTOSCOPY WITH URETHRAL DILATATION HYDRODISTENSION AND INSTILLATION OF CLORPACTION   . CYSTOSTOMY W/ BLADDER BIOPSY  1983  . DILATION AND CURETTAGE OF UTERUS  1987  . ERCP  1988   for pancreatitis with stents   S/P SEVERE PANCREATITIS  . HEMORRHOID SURGERY    . LASIK Bilateral APRIL 2005  . MULTIPLE CYSTO/ HOD/ URETHRAL DILATION/ INSTILLATION CLORPACTIN  .LAST ONE 03-31-2010  . NASAL SINUS SURGERY  1977   sinus cyst  . REDUCTION MAMMAPLASTY Bilateral 1988  . revision rhinoplasty  1978  . RHINOPLASTY  1977  . RIGHT SALPINGOOPHECTOMY  1998  . TONSILLECTOMY AND ADENOIDECTOMY  1963   Social History   Occupational History  . RN    Social History Main Topics  .  Smoking status: Passive Smoke Exposure - Never Smoker  . Smokeless tobacco: Never Used     Comment: Father smoked briefly  . Alcohol use No  . Drug use: No  . Sexual activity: Not on file

## 2016-08-02 LAB — AFB CULTURE WITH SMEAR (NOT AT ARMC)

## 2016-08-10 ENCOUNTER — Encounter: Payer: Self-pay | Admitting: Pulmonary Disease

## 2016-08-10 ENCOUNTER — Ambulatory Visit (INDEPENDENT_AMBULATORY_CARE_PROVIDER_SITE_OTHER): Payer: PRIVATE HEALTH INSURANCE | Admitting: Pulmonary Disease

## 2016-08-10 ENCOUNTER — Telehealth: Payer: Self-pay | Admitting: Pulmonary Disease

## 2016-08-10 VITALS — BP 128/76 | HR 78 | Ht 62.0 in | Wt 140.0 lb

## 2016-08-10 DIAGNOSIS — R05 Cough: Secondary | ICD-10-CM

## 2016-08-10 DIAGNOSIS — J302 Other seasonal allergic rhinitis: Secondary | ICD-10-CM | POA: Diagnosis not present

## 2016-08-10 DIAGNOSIS — J4540 Moderate persistent asthma, uncomplicated: Secondary | ICD-10-CM | POA: Diagnosis not present

## 2016-08-10 DIAGNOSIS — K219 Gastro-esophageal reflux disease without esophagitis: Secondary | ICD-10-CM | POA: Diagnosis not present

## 2016-08-10 DIAGNOSIS — R059 Cough, unspecified: Secondary | ICD-10-CM

## 2016-08-10 MED ORDER — AZELASTINE-FLUTICASONE 137-50 MCG/ACT NA SUSP: 1.0000 | Freq: Every day | NASAL | 0 refills | Status: DC

## 2016-08-10 NOTE — Patient Instructions (Signed)
   Call or e-mail me if you have any questions or concerns before your bronchoscopy.  I will see you back after your procedure.  We will plan on doing your procedure on Thursday the week of 4/23 unless you tell me otherwise.

## 2016-08-10 NOTE — Progress Notes (Signed)
Subjective:    Patient ID: Brenda [name], female    DOB: 12-25-1950, 66 y.o.   MRN: [id number]  C.C.:  Follow-up for Cough, Moderate, Persistent Asthma, GERD, & Chronic Seasonal Allergic Rhinitis.   HPI Cough: Patient does have very minimal centrilobular micronodularity that is predominantly groundglass in the upper lungs. Cough is somewhat better. Still producing a yellow phlegm. No hemoptysis.   Moderate, persistent asthma: Previously on Breo. Inhalers discontinued at last appointment. Patient has also been on Symbicort. Continue on Singulair. Mild intermittent wheezing. She hasn't needed her rescue inhaler.   Chronic seasonal Allergic rhinitis: Currently prescribed Singulair & Flonase. Patient has declined evaluation by ENT as to etiology of cough. She has been using Dymista in place of Flonase. She has had increased sinus congestion & drainage. She does have post-nasal drainage.   GERD: Currently prescribed Protonix & Zantac. Followed by GI (Dr. Carlean Purl). No reflux or dyspepsia. No morning brash water taste.   Review of Systems  No fever, chills, or sweats. She has had some mild chest tightness. No chest pain or pressure except with cough. No abdominal pain or nausea.   Allergies  Allergen Reactions  . Latex Rash  . Adhesive [Tape] Other (See Comments)    TEARS SKIN  . Bupivacaine Other (See Comments)    Other Reaction: PRE-SYNCOPE, TACTYCARDIA TACHYCARDIA/ PRESYNCOPE  . Butorphanol Tartrate Other (See Comments)    Other Reaction: NUMBNESS, TACHYCARDIA  . Clarithromycin Nausea Only and Other (See Comments)    TACHYCARDIA  . Codeine Other (See Comments)    SEVERE ELEVATED LIVER ENZYMES  . Cymbalta [Duloxetine Hcl] Other (See Comments)    Blurred vision  . Dilaudid [Hydromorphone Hcl] Itching  . Elavil [Amitriptyline Hcl] Other (See Comments)    Elevated liver enzymes  . Hydrocodone Other (See Comments)    Elevated liver enzymes  . Hyoscyamine Sulfate Other (See Comments)    . Lactated Ringers Other (See Comments)    COULD CAUSE PANIC ATTACK  . Levbid [Hyoscyamine Sulfate] Other (See Comments)    Dizziness, nausea, headache  . Lisinopril Other (See Comments)    Dry cough  . Marcaine [Bupivacaine Hcl] Other (See Comments)    TACHYCARDIA, PRESYNCOPE  . Pentazocine Lactate Other (See Comments)    tachycardia  . Percodan [Oxycodone-Aspirin] Nausea Only  . Stadol [Butorphanol Tartrate] Swelling    Lips numb, tongue swell, tachycardia  . Trazodone And Nefazodone Other (See Comments)    tachycardia  . Vistaril [Hydroxyzine Hcl] Other (See Comments)    Altered sensorium  . Morphine And Related Itching and Rash    Current Outpatient Prescriptions on File Prior to Visit  Medication Sig Dispense Refill  . albuterol (VENTOLIN HFA) 108 (90 Base) MCG/ACT inhaler Inhale into the lungs every 6 (six) hours as needed for wheezing or shortness of breath.    . Azelastine-Fluticasone (DYMISTA) 137-50 MCG/ACT SUSP Place 1 spray into the nose daily. 1 Bottle 0  . BIOTIN PO Take by mouth.    . budesonide-formoterol (SYMBICORT) 160-4.5 MCG/ACT inhaler Inhale 2 puffs into the lungs 2 (two) times daily. 1 Inhaler 3  . Calcium Carbonate-Vitamin D (CALCIUM + D PO) Take by mouth daily. 1 tablet daily    . estradiol (VIVELLE-DOT) 0.1 MG/24HR Place 1 patch onto the skin 2 (two) times a week.    . ezetimibe (ZETIA) 10 MG tablet Take 10 mg by mouth daily.     . fish oil-omega-3 fatty acids 1000 MG capsule Take 1 g by mouth daily.     Marland Kitchen  Fluticasone Furoate-Vilanterol (BREO ELLIPTA IN) Inhale 1 puff into the lungs daily.    . folic acid (FOLVITE) 1 MG tablet Take 1 mg by mouth daily.    Marland Kitchen LORazepam (ATIVAN) 1 MG tablet 1/2 to 1 tab twice daily    . metoprolol (LOPRESSOR) 50 MG tablet Take 25 mg by mouth 2 (two) times daily.     . montelukast (SINGULAIR) 10 MG tablet Take 10 mg by mouth at bedtime.    . Multiple Vitamin (MULTIVITAMIN) tablet Take 1 tablet by mouth daily.    Marland Kitchen  olmesartan (BENICAR) 20 MG tablet Take 20 mg by mouth daily.    . pantoprazole (PROTONIX) 40 MG tablet Take 40 mg by mouth daily.    . ranitidine (ZANTAC) 150 MG tablet Take 1 tablet (150 mg total) by mouth at bedtime. 30 tablet 3  . sertraline (ZOLOFT) 50 MG tablet Take 50 mg by mouth daily.    . Vitamin D, Ergocalciferol, (DRISDOL) 50000 UNITS CAPS Take 50,000 Units by mouth every 7 (seven) days.       Current Facility-Administered Medications on File Prior to Visit  Medication Dose Route Frequency Provider Last Rate Last Dose  . 0.9 %  sodium chloride infusion  500 mL Intravenous Continuous Gatha Mayer, MD        Past Medical History:  Diagnosis Date  . Allergic rhinitis   . Allergy   . Anal fissure   . Anemia   . Anxiety   . Arthritis   . Asthma   . Bladder pain   . Complication of anesthesia PAIN ATTACKS   LACTATED RINGERS- CONTRAINDICATED  . Depression   . Fibromyalgia   . Frequency of urination   . GERD (gastroesophageal reflux disease)   . Heart murmur   . History of pancreatitis 1988-1989  . Hyperlipidemia   . Hypertension LABILE  . IBS (irritable bowel syndrome)   . Interstitial cystitis   . Migraines   . Normal cardiac stress test JULY 2009---  NORMAL  . 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 Spasm of sphincter of Oddi 1987  . Stroke ([place])   . Urethral stenosis S/P DILATIONS  . Urgency of urination     Past Surgical History:  Procedure Laterality Date  . ABDOMINAL HYSTERECTOMY  1988  . APPENDECTOMY  04/ 1986  . bilateral reduction mastopexy  1988  . bilateral turbinate resection  1990   nasal spreading graft  . CATARACT EXTRACTION, BILATERAL    . CHOLECYSTECTOMY  04/ 1986  . COLONOSCOPY  02/2004, 11/2010   2005: Normal - Dr. Sammuel Cooper 2012: Normal  . CYSTO WITH HYDRODISTENSION N/A 12/17/2013   Procedure: CYSTO HYDRODISTENSION WITH INSTALLATION OF CHLOROPACTIN AND TETRACAINE;   Surgeon: Malka So, MD;  Location: [facility];  Service: Urology;  Laterality: N/A;  . CYSTO WITH HYDRODISTENSION N/A 06/02/2015   Procedure: CYSTOSCOPY/HYDRODISTENSION WITH INSTILLATION OF CLORPACTIN ;  Surgeon: [name], MD;  Location: [facility];  Service: Urology;  Laterality: N/A;  . CYSTOSCOPY WITH URETHRAL DILATATION  07/12/2011   Procedure: CYSTOSCOPY WITH URETHRAL DILATATION;  Surgeon: Malka So, MD;  Location[facility];  Service: Urology;  Laterality: N/A;  HOD AND INSTILLATION OF CHLOROPACTIN C-ARM   . CYSTOSCOPY WITH URETHRAL DILATATION N/A 06/26/2012   Procedure: CYSTOSCOPY WITH URETHRAL DILATATION HYDRODISTENSION AND INSTILLATION OF CLORPACTION ;  Surgeon:  Malka So, MD;  Location: [facility];  Service: Urology;  Laterality: N/A;  CYSTOSCOPY WITH URETHRAL DILATATION HYDRODISTENSION AND INSTILLATION OF CLORPACTION   . CYSTOSTOMY W/ BLADDER BIOPSY  1983  . DILATION AND CURETTAGE OF UTERUS  1987  . ERCP  1988   for pancreatitis with stents   S/P SEVERE PANCREATITIS  . HEMORRHOID SURGERY    . LASIK Bilateral APRIL 2005  . MULTIPLE CYSTO/ HOD/ URETHRAL DILATION/ INSTILLATION CLORPACTIN  .LAST ONE 03-31-2010  . NASAL SINUS SURGERY  1977   sinus cyst  . REDUCTION MAMMAPLASTY Bilateral 1988  . revision rhinoplasty  1978  . RHINOPLASTY  1977  . RIGHT SALPINGOOPHECTOMY  1998  . TONSILLECTOMY AND ADENOIDECTOMY  1963    Family History  Problem Relation Age of Onset  . Colon cancer Maternal Grandmother   . Colon cancer Cousin   . Allergies Mother   . Asthma Mother   . Heart disease Mother   . Osteoarthritis Mother   . Diabetes Mother   . Irritable bowel syndrome Mother   . Brain cancer Father   . Osteoarthritis Father   . Colon polyps Father     adenomatous  . Heart disease Father   . Melanoma Sister   . Osteoarthritis Sister   . Irritable bowel syndrome Sister   . Diabetes Sister   . Heart disease  Sister   . Stroke Sister   . Ulcerative colitis Maternal Uncle   . Stomach cancer Neg Hx   . Rheumatologic disease Neg Hx     Social History   Social History  . Marital status: Married    Spouse name: N/A  . Number of children: 0  . Years of education: N/A   Occupational History  . RN    Social History Main Topics  . Smoking status: Passive Smoke Exposure - Never Smoker  . Smokeless tobacco: Never Used     Comment: Father smoked briefly  . Alcohol use No  . Drug use: No  . Sexual activity: Not Asked   Other Topics Concern  . None   Social History Narrative   Married, lives with spouse   No children   RN at [employer]   No recent travel      [facility]:   Originally from [place]. Always lived in [place]. Previously has traveled to [place], [place], [place], [place], & mostly [place]. No indoor pets currently. She does have cats in her garage. No bird, mold, or hot tub exposure. No indoor plants. Mostly wood floors. She did have her bedroom carpet taken up in Summer 2017. Has mostly blinds. No wood burning fire place. Previously enjoyed Firefighter & playing piano.       Objective:   Physical Exam BP 128/76 (BP Location: Left Arm, Cuff Size: Normal)   Pulse 78   Ht 5\' 2"  (1.575 m)   Wt 140 lb (63.5 kg)   SpO2 96%   BMI 25.61 kg/m   Gen.: No distress. Caucasian female alert. HEENT: Moist mucous membrane. Mild bilateral nasal turbinate swelling. Mallampati class I. Abdomen: Soft. Nondistended. Normoactive bowel sounds. Pulmonary: Clear on auscultation. No accessory muscle use on room air. Intermittent cough witnessed. Cardiovascular: Regular rate. Regular rhythm. No edema or JVD appreciated.  PFT 04/18/16: FVC 2.06 L (76%) FEV1 1.84 L (80%) FEV1/FVC 0.89 FEF 25-75 3.03 L (156%) negative bronchodilator response TLC 43 L (108%) RV 139% ERV 25% DLCO uncorrected 67% 10/20/13: FVC 2.14 L (86%) FEV1 1.98 L (  92%) FEV1/FVC 0.82 FEF 25-75 2.14 L (104%) negative  bronchodilator response TLC 3.73 L (83%) RV 70% ERV 65% DLCO uncorrected 92%  IMAGING CT CHEST W/O 07/17/16 (personally reviewed by me): Upper lobe predominant centrilobular groundglass micronodules. He with minimal subpleural reticulation. Seems to have progressed somewhat. No pleural effusion or thickening. No pericardial effusion. No pathologic mediastinal adenopathy.  CXR PA/LAT 06/07/16 (Previously reviewed by me):  No focal opacity or mass appreciated. No pleural effusion. Heart normal in size & mediastinum normal in contour.  MAXILLOFACIAL CT LTD W/O 04/19/16 (per radiologist): Visualized paranasal sinuses are essentially clear.  BARIUM SWALLOW/ESOPHAGRAM 04/16/16 (per radiologist):  Mild gastroesophageal reflux. No stricture.   CT CHEST W/O 03/28/15 (previously reviewed by me):  No parenchymal nodule or opacity appreciated. No pleural effusion or thickening. No pericardial effusion. No pathologic mediastinal adenopathy.  MICROBIOLOGY Sputum Culture (06/14/16):  Normal Oral Flora / AFB pending / Fungus pending Sputum Culture (04/19/16):  Scant Yeast not identified / AFB negative / Normal Oral Flora Sputum Culture (05/04/16):  Normal Oral Flora  LABS 04/18/16 CBC: 9.9/13.8/40.0/431 IgM: 129 IgA: 139 IgG: 587 IgE: <2 RAST panel: Negative  06/02/15 HGB:  15.3    Assessment & Plan:  66 y.o. female with persistent cough, moderate, persistent asthma, chronic seasonal allergic rhinitis, & GERD. I reviewed the patient's CT scan with her today which does show some very minimal centrilobular groundglass nodules. She has no exposures or history to suggest hypersensitivity pneumonitis nor does she have any ongoing tobacco use distant just a smoking-related lung disease. An atypical infection is certainly possible and we did discuss bronchoscopy with airway inspection and lavage. I reviewed the risks of the procedure including bleeding, infection, pneumothorax, vocal cord injury, medication  allergy, and potentially death. The patient is willing to undergo the procedure. Overall she does continue to have problems from her allergic rhinitis which could certainly be a major factor and may require ENT evaluation depending upon the results of her bronchoscopy.  1. Cough:  Planning for bronchoscopy the week of 4/23 with airway inspection and lavage for bilateral upper lobes. If this is unrevealing plan for evaluation by ENT. 2. Moderate, persistent asthma: Continuing to hold on inhaler therapy at this time. No new medications. 3. Chronic seasonal allergic rhinitis: Patient given sample of Diovan listed today. Continuing Singulair. 4. GERD: Well-controlled. Continuing Protonix & Zantac. 5. Health maintenance:  Status post Prevnar March 2018, Pneumovax October 2014 & influenza vaccine February 2018.  6. Follow-up: Return to clinic in 4 weeks or sooner if needed.  Sonia Baller Ashok Cordia, M.D. [facility] Pager:  [phone] After 3pm or if no response, call [phone] 9:15 AM 08/10/16

## 2016-08-10 NOTE — Addendum Note (Signed)
Addended by: Tyson Dense on: 08/10/2016 09:45 AM   Modules accepted: Orders

## 2016-08-10 NOTE — Telephone Encounter (Signed)
Pt was called to inform her of bronch scheduled for 4/26 at 8 to occur at [facility] with [facility]. Pt did not answer and voicemail was left to contact office. When patient returns call: she will be informed of appointment time and location, to arrive at hospital at [place], and someone will contact her prior to appointment with instructions.

## 2016-08-10 NOTE — Telephone Encounter (Signed)
Called and spoke with pt and she is aware of recs from [facility].  Nothing further is needed.

## 2016-08-10 NOTE — Telephone Encounter (Signed)
Patient returning call - she can be reached at work [phone] ext [phone] -pr

## 2016-08-13 ENCOUNTER — Ambulatory Visit (INDEPENDENT_AMBULATORY_CARE_PROVIDER_SITE_OTHER): Payer: PRIVATE HEALTH INSURANCE | Admitting: Orthopaedic Surgery

## 2016-08-13 ENCOUNTER — Ambulatory Visit (INDEPENDENT_AMBULATORY_CARE_PROVIDER_SITE_OTHER): Payer: PRIVATE HEALTH INSURANCE

## 2016-08-13 DIAGNOSIS — M25512 Pain in left shoulder: Secondary | ICD-10-CM

## 2016-08-13 DIAGNOSIS — G8929 Other chronic pain: Secondary | ICD-10-CM

## 2016-08-13 MED ORDER — BUPIVACAINE HCL 0.5 % IJ SOLN
2.0000 mL | INTRAMUSCULAR | Status: AC | PRN
  Administered 2016-08-13: 2 mL via INTRA_ARTICULAR

## 2016-08-13 MED ORDER — METHYLPREDNISOLONE ACETATE 40 MG/ML IJ SUSP
80.0000 mg | INTRAMUSCULAR | Status: AC | PRN
  Administered 2016-08-13: 80 mg

## 2016-08-13 MED ORDER — LIDOCAINE HCL 1 % IJ SOLN
2.0000 mL | INTRAMUSCULAR | Status: AC | PRN
  Administered 2016-08-13: 2 mL

## 2016-08-13 NOTE — Progress Notes (Signed)
Office Visit Note   Patient: Brenda [name]           Date of Birth: 13-Feb-1951           MRN: [id number] Visit Date: 08/13/2016              Requested by: Prince Solian, MD [address] [place], [place] [address] PCP: Tivis Ringer, MD   Assessment & Plan: Visit Diagnoses:  1. 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 Follow-up as needed  Follow-Up Instructions: No Follow-up on file.   Orders:  Orders Placed This Encounter  Procedures  . XR Shoulder Left   No orders of the defined types were placed in this encounter.     Procedures: Large Joint Inj Date/Time: 08/13/2016 11:55 AM Performed by: Garald Balding Authorized by: Garald Balding   Consent Given by:  Patient Timeout: prior to procedure the correct patient, procedure, and site was verified   Indications:  Pain Location:  Shoulder Site:  L subacromial bursa Prep: patient was prepped and draped in usual sterile fashion   Needle Size:  25 G Needle Length:  1.5 inches Approach:  Lateral Ultrasound Guidance: No   Fluoroscopic Guidance: No   Arthrogram: No   Medications:  80 mg methylPREDNISolone acetate 40 MG/ML; 2 mL lidocaine 1 %; 2 mL bupivacaine 0.5 % Aspiration Attempted: No   Patient tolerance:  Patient tolerated the procedure well with no immediate complications     Clinical Data: No additional findings.   Subjective: Chief Complaint  Patient presents with  . Left Shoulder - Pain    Brenda [name] is a 66 year old female that presents with chronic left shoulder pain, (NOT WC RELATED) x 6 years. Pt relates it has been hurting since  03/2016. Wants injection, minimal numbness/tingling  Brenda [name] is had some recurrent pain in left shoulder over period of years. She remembers having a cortisone injection many years ago with relief. On this  occasion the pain was somewhat insidious in onset and localized to the shoulder. Pain or numbness or tingling of significance. SHe's had some difficulty raising her arm overhead and sleeping on that side and somewhat localized the pain to the area of the acromioclavicular joint.  HPI  Review of Systems   Objective: Vital Signs: There were no vitals taken for this visit.  Physical Exam  Ortho Exam left shoulder with full overhead motion. Positive impingement. Negative empty can. Biceps intact. Skin intact. Pain along the anterior subacromial region and the acromioclavicular joint which seemed a little bit prominent. Good grip distally. Painless range of motion of cervical spine.  Specialty Comments:  No specialty comments available.  Imaging: Xr Shoulder Left  Result Date: 08/13/2016 3 views of the left shoulder were obtained. The humeral head is centered in glenoid. No ectopic calcification. Some degenerative changes at the acromioclavicular joint with erosive changes at the distal clavicle    PMFS History: Patient Active Problem List   Diagnosis Date Noted  . Moderate persistent asthma 03/19/2016  . Chronic seasonal allergic rhinitis 03/19/2016  . GERD (gastroesophageal reflux disease) 03/19/2016  . Cough 03/19/2016  . Essential hypertension, benign 03/19/2016  . H/O: CVA (cerebrovascular accident) 03/19/2016  . IBS (irritable bowel syndrome) 03/19/2016  . Interstitial cystitis 03/19/2016  . Fibromyalgia 03/19/2016  . DJD (degenerative joint disease) 03/19/2016  . Arthritis 03/19/2016  . Sphincter of Oddi dysfunction  03/19/2016  . H/O acute pancreatitis 03/19/2016   Past Medical History:  Diagnosis Date  . Allergic rhinitis   . Allergy   . Anal fissure   . Anemia   . Anxiety   . Arthritis   . Asthma   . Bladder pain   . Complication of anesthesia PAIN ATTACKS   LACTATED RINGERS- CONTRAINDICATED  . Depression   . Fibromyalgia   . Frequency of urination   . GERD  (gastroesophageal reflux disease)   . Heart murmur   . History of pancreatitis 1988-1989  . Hyperlipidemia   . Hypertension LABILE  . IBS (irritable bowel syndrome)   . Interstitial cystitis   . Migraines   . Normal cardiac stress test JULY 2009---  NORMAL  . 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 Spasm of sphincter of Oddi 1987  . Stroke ([place])   . Urethral stenosis S/P DILATIONS  . Urgency of urination     Family History  Problem Relation Age of Onset  . Colon cancer Maternal Grandmother   . Colon cancer Cousin   . Allergies Mother   . Asthma Mother   . Heart disease Mother   . Osteoarthritis Mother   . Diabetes Mother   . Irritable bowel syndrome Mother   . Brain cancer Father   . Osteoarthritis Father   . Colon polyps Father     adenomatous  . Heart disease Father   . Melanoma Sister   . Osteoarthritis Sister   . Irritable bowel syndrome Sister   . Diabetes Sister   . Heart disease Sister   . Stroke Sister   . Ulcerative colitis Maternal Uncle   . Stomach cancer Neg Hx   . Rheumatologic disease Neg Hx     Past Surgical History:  Procedure Laterality Date  . ABDOMINAL HYSTERECTOMY  1988  . APPENDECTOMY  04/ 1986  . bilateral reduction mastopexy  1988  . bilateral turbinate resection  1990   nasal spreading graft  . CATARACT EXTRACTION, BILATERAL    . CHOLECYSTECTOMY  04/ 1986  . COLONOSCOPY  02/2004, 11/2010   2005: Normal - Dr. Sammuel Cooper 2012: Normal  . CYSTO WITH HYDRODISTENSION N/A 12/17/2013   Procedure: CYSTO HYDRODISTENSION WITH INSTALLATION OF CHLOROPACTIN AND TETRACAINE;  Surgeon: Malka So, MD;  Location: [facility];  Service: Urology;  Laterality: N/A;  . CYSTO WITH HYDRODISTENSION N/A 06/02/2015   Procedure: CYSTOSCOPY/HYDRODISTENSION WITH INSTILLATION OF CLORPACTIN ;  Surgeon: [name], MD;  Location[facility];  Service: Urology;   Laterality: N/A;  . CYSTOSCOPY WITH URETHRAL DILATATION  07/12/2011   Procedure: CYSTOSCOPY WITH URETHRAL DILATATION;  Surgeon: Malka So, MD;  Location[facility];  Service: Urology;  Laterality: N/A;  HOD AND INSTILLATION OF CHLOROPACTIN C-ARM   . CYSTOSCOPY WITH URETHRAL DILATATION N/A 06/26/2012   Procedure: CYSTOSCOPY WITH URETHRAL DILATATION HYDRODISTENSION AND INSTILLATION OF CLORPACTION ;  Surgeon: Malka So, MD;  Location: [facility];  Service: Urology;  Laterality: N/A;  CYSTOSCOPY WITH URETHRAL DILATATION HYDRODISTENSION AND INSTILLATION OF CLORPACTION   . CYSTOSTOMY W/ BLADDER BIOPSY  1983  . DILATION AND CURETTAGE OF UTERUS  1987  . ERCP  1988   for pancreatitis with stents   S/P SEVERE PANCREATITIS  . HEMORRHOID SURGERY    . LASIK Bilateral APRIL 2005  . MULTIPLE CYSTO/ HOD/ URETHRAL  DILATION/ INSTILLATION CLORPACTIN  .LAST ONE 03-31-2010  . NASAL SINUS SURGERY  1977   sinus cyst  . REDUCTION MAMMAPLASTY Bilateral 1988  . revision rhinoplasty  1978  . RHINOPLASTY  1977  . RIGHT SALPINGOOPHECTOMY  1998  . TONSILLECTOMY AND ADENOIDECTOMY  1963   Social History   Occupational History  . RN    Social History Main Topics  . Smoking status: Passive Smoke Exposure - Never Smoker  . Smokeless tobacco: Never Used     Comment: Father smoked briefly  . Alcohol use No  . Drug use: No  . Sexual activity: Not on file

## 2016-08-22 ENCOUNTER — Telehealth: Payer: Self-pay | Admitting: Pulmonary Disease

## 2016-08-22 NOTE — Telephone Encounter (Signed)
As I was talking to the pt, respiratory was calling pt on her cell to give her instructions. Nothing further is needed

## 2016-08-23 ENCOUNTER — Encounter (HOSPITAL_COMMUNITY)
Admission: RE | Disposition: A | Discharge status: Home / Self Care | Payer: Self-pay | Source: Ambulatory Visit | Attending: Pulmonary Disease

## 2016-08-23 ENCOUNTER — Ambulatory Visit (HOSPITAL_COMMUNITY)
Admission: RE | Admit: 2016-08-23 | Discharge: 2016-08-23 | Disposition: A | Discharge status: Home / Self Care | Payer: PRIVATE HEALTH INSURANCE | Source: Ambulatory Visit | Attending: Pulmonary Disease | Admitting: Pulmonary Disease

## 2016-08-23 ENCOUNTER — Telehealth: Payer: Self-pay

## 2016-08-23 ENCOUNTER — Other Ambulatory Visit: Payer: Self-pay | Admitting: Pulmonary Disease

## 2016-08-23 ENCOUNTER — Encounter (HOSPITAL_COMMUNITY): Payer: Self-pay

## 2016-08-23 ENCOUNTER — Telehealth: Payer: Self-pay | Admitting: Pulmonary Disease

## 2016-08-23 DIAGNOSIS — E785 Hyperlipidemia, unspecified: Secondary | ICD-10-CM | POA: Diagnosis not present

## 2016-08-23 DIAGNOSIS — J4540 Moderate persistent asthma, uncomplicated: Secondary | ICD-10-CM | POA: Insufficient documentation

## 2016-08-23 DIAGNOSIS — K219 Gastro-esophageal reflux disease without esophagitis: Secondary | ICD-10-CM | POA: Diagnosis not present

## 2016-08-23 DIAGNOSIS — R05 Cough: Secondary | ICD-10-CM | POA: Diagnosis not present

## 2016-08-23 DIAGNOSIS — Z8673 Personal history of transient ischemic attack (TIA), and cerebral infarction without residual deficits: Secondary | ICD-10-CM | POA: Diagnosis not present

## 2016-08-23 DIAGNOSIS — I10 Essential (primary) hypertension: Secondary | ICD-10-CM | POA: Diagnosis not present

## 2016-08-23 DIAGNOSIS — J45909 Unspecified asthma, uncomplicated: Secondary | ICD-10-CM | POA: Insufficient documentation

## 2016-08-23 DIAGNOSIS — Z79899 Other long term (current) drug therapy: Secondary | ICD-10-CM | POA: Insufficient documentation

## 2016-08-23 DIAGNOSIS — J984 Other disorders of lung: Secondary | ICD-10-CM | POA: Diagnosis not present

## 2016-08-23 DIAGNOSIS — R053 Chronic cough: Secondary | ICD-10-CM

## 2016-08-23 DIAGNOSIS — F329 Major depressive disorder, single episode, unspecified: Secondary | ICD-10-CM | POA: Diagnosis not present

## 2016-08-23 DIAGNOSIS — F419 Anxiety disorder, unspecified: Secondary | ICD-10-CM | POA: Insufficient documentation

## 2016-08-23 DIAGNOSIS — Z7722 Contact with and (suspected) exposure to environmental tobacco smoke (acute) (chronic): Secondary | ICD-10-CM | POA: Insufficient documentation

## 2016-08-23 HISTORY — PX: VIDEO BRONCHOSCOPY: SHX5072

## 2016-08-23 LAB — BODY FLUID CELL COUNT WITH DIFFERENTIAL
Lymphs, Fluid: 27 %
Lymphs, Fluid: 34 %
Monocyte-Macrophage-Serous Fluid: 48 % — ABNORMAL LOW (ref 50–90)
Monocyte-Macrophage-Serous Fluid: 51 % (ref 50–90)
Neutrophil Count, Fluid: 15 % (ref 0–25)
Neutrophil Count, Fluid: 25 % (ref 0–25)
Total Nucleated Cell Count, Fluid: 43 cu mm (ref 0–1000)
Total Nucleated Cell Count, Fluid: 52 uL (ref 0–1000)

## 2016-08-23 SURGERY — VIDEO BRONCHOSCOPY WITHOUT FLUORO
Anesthesia: Moderate Sedation | Laterality: Bilateral

## 2016-08-23 MED ORDER — AZELASTINE-FLUTICASONE 137-50 MCG/ACT NA SUSP: 1.0000 | Freq: Every day | NASAL | 0 refills | Status: DC

## 2016-08-23 MED ORDER — SODIUM CHLORIDE 0.9 % IV SOLN: Freq: Once | INTRAVENOUS | Status: DC

## 2016-08-23 MED ORDER — FENTANYL CITRATE (PF) 100 MCG/2ML IJ SOLN
INTRAMUSCULAR | Status: AC
  Filled 2016-08-23: qty 4

## 2016-08-23 MED ORDER — MIDAZOLAM HCL 10 MG/2ML IJ SOLN
INTRAMUSCULAR | Status: DC | PRN
  Administered 2016-08-23 (×2): 2 mg via INTRAVENOUS

## 2016-08-23 MED ORDER — FENTANYL CITRATE (PF) 100 MCG/2ML IJ SOLN
INTRAMUSCULAR | Status: DC | PRN
  Administered 2016-08-23: 50 ug via INTRAVENOUS
  Administered 2016-08-23 (×3): 25 ug via INTRAVENOUS

## 2016-08-23 MED ORDER — LIDOCAINE HCL (PF) 1 % IJ SOLN
INTRAMUSCULAR | Status: DC | PRN
  Administered 2016-08-23: 10 mL

## 2016-08-23 MED ORDER — SODIUM CHLORIDE 0.9 % IV SOLN
Freq: Once | INTRAVENOUS | Status: AC
  Administered 2016-08-23: 08:00:00 via INTRAVENOUS

## 2016-08-23 MED ORDER — MIDAZOLAM HCL 5 MG/ML IJ SOLN
INTRAMUSCULAR | Status: AC
  Filled 2016-08-23: qty 2

## 2016-08-23 NOTE — Op Note (Signed)
Video Bronchoscopy Procedure Note  Pre-Procedure Diagnoses: 1.  Persistent Cough  Post-Procedure Diagnoses: 1.  Persistent Cough 2.  Inflamed Mucosa  Procedures Performed: 1. Bronchoscopy with Airway Inspection 2. Bronchoalveolar Lavage  Consent:  Informed consent was obtained from the patient after discussing the risks and benefits of the procedure including bleeding, infection, pneumothorax, medication allergy, vocal chord injury, and potentially death.  Conscious Sedation:   Time of First Medication Administration:  8:28 am Time of Last Medication Administration:  8:49 am Time Physicial Left Room:  9:00 am  Medications Administered During Conscious Sedation: 1. Lidocaine 1% gargle 10cc 2. Lidocaine 1% 14cc via bronchoscope 3. Versed 4 mg IV 4. Fentanyl 125 mcg IV  Pre-Procedure Physical Exam: General:  No acute distress. Awake. Alert. ASA Class 2. HEENT:  Moist mucus membranes. No oral ulcers. Mallampati Class 1. Cardiovascular:  Regular rate. No edema. No appreciable JVD. Pulmonary:  Clear to auscultation with good aeration bilaterally. Normal work of breathing. Abdomen:  Soft. Nontender. Nondistended. Normal bowel sounds. Musculoskeletal:  Normal bulk and tone. Normal neck flexion & extension. Neurological:  Oriented to person, place, and time. Moving all 4 extremities equally.  Description of Procedure: Patient was brought back to the endoscopy procedure room.  A time out was performed to identify the correct patient and procedure.  Lidocaine gargle was performed.  Patient was laid recumbent and conscious sedation was administered by respiratory therapy under my direction.  Bite block was inserted and towel was placed over the patient's eyes.  Flexible bronchoscope was then inserted into the posterior pharynx until vocal chords were in full view. There was no abnormality of the vocal chords or arytenoids.  A total of 8cc of Lidocaine was used to anesthetize the vocal chords.   The bronchoscope was then inserted between the vocal chords with ease into the proximal trachea.  Lidocaine was then used to anesthetize the patient's proximal airways.  An airway inspeciton was performed finding diffuse, erythematous mucosa throughout the airways leading to bilateral upper lobes, lingula, and right middle lobe. This mucosal abnormality did extend somewhat into the airways leading to the bilateral lower lobes but these were relatively spared moving forward. There was a small amount of thin, clear secretions/mucus suctioned bilaterally. There was slight narrowing of the right middle lobe bronchus. The bronchoscope was then advanced into the anterior segment of the right upper lobe.  A lavage was performed by instilling a total of 120 cc of sterile saline and aspirating a total of 90 cc of clear fluid.  The bronchoscope was then repositioned to the lateral segment of the right middle lobe.  A second lavage was performed by instilling a total of 120 cc of sterile saline and aspirating a total of 120 cc of blood-tinged fluid. The flexible bronchoscope was then removed from the patient's airways after suctioning of the remaining secretions. The bite block was removed and the patient was returned to the upright position.  Blood Loss:  None.  Complications:  None.  Bronchoalveolar Lavage: 1. Performed on the Right Upper Lobe Lobe:  Sent for cell count, Aspergillus Antigen, Fungal smear & culture, AFB smear & culture, and Quantitative BAL Culture. 2. Performed on the Right Middle Lobe Lobe:  Sent for cell count, Aspergillus Antigen, Fungal smear & culture, AFB smear & culture, and Quantitative BAL Culture.  Post Procedure Instructions: Personally spoke with the patient's husband relaying the preliminary results of the procedure.  Instructed him that the patient is not to operate a car for 24  hours.  The patient should seek immediate medical attention if there is any persistent or progressive  hemoptysis, difficulty breathing, or chest pain/discomfort.  The patient should also notify me immediately or seek medical attention for any purulent sputum production or fever occuring today or in the coming days.  The patient will be contacted by me once the final results of the studies are available.  The patient should call my office if they have any questions.

## 2016-08-23 NOTE — Telephone Encounter (Signed)
See other telephone note from 08/23/16 regarding samples.

## 2016-08-23 NOTE — Interval H&P Note (Signed)
History and Physical Interval Note:  08/23/2016 8:22 AM  Brenda [name]  has presented today for surgery, with the diagnosis of cough  The various methods of treatment have been discussed with the patient and family. After consideration of risks, benefits and other options for treatment, the patient has consented to  Procedure(s): VIDEO BRONCHOSCOPY WITHOUT FLUORO (Bilateral) as a surgical intervention .  The patient's history has been reviewed, patient examined, no change in status, stable for surgery.  I have reviewed the patient's chart and labs.  Questions were answered to the patient's satisfaction.    Sonia Baller Ashok Cordia, M.D. [facility] Pulmonary & Critical Care Pager:  [phone] After 3pm or if no response, call [phone] 8:22 AM 08/23/16

## 2016-08-23 NOTE — Discharge Instructions (Signed)
Flexible Bronchoscopy, Care After These instructions give you information on caring for yourself after your procedure. Your doctor may also give you more specific instructions. Call your doctor if you have any problems or questions after your procedure. Follow these instructions at home:  Do not eat or drink anything for 2 hours after your procedure. If you try to eat or drink before the medicine wears off, food or drink could go into your lungs. You could also burn yourself.  After 2 hours have passed and when you can cough and gag normally, you may eat soft food and drink liquids slowly.  The day after the test, you may eat your normal diet.  You may do your normal activities.  Keep all doctor visits. Get help right away if:  You get more and more short of breath.  You get light-headed.  You feel like you are going to pass out (faint).  You have chest pain.  You have new problems that worry you.  You cough up more than a little blood.  You cough up more blood than before. This information is not intended to replace advice given to you by your health care provider. Make sure you discuss any questions you have with your health care provider. Document Released: 02/11/2009 Document Revised: 09/22/2015 Document Reviewed: 12/19/2012 Elsevier Interactive Patient Education  2017 [place] not eat or drink anything until 1030 08/23/2016

## 2016-08-23 NOTE — H&P (View-Only) (Signed)
Subjective:    Patient ID: Brenda [name], female    DOB: 1950-08-22, 66 y.o.   MRN: [id number]  C.C.:  Follow-up for Cough, Moderate, Persistent Asthma, GERD, & Chronic Seasonal Allergic Rhinitis.   HPI Cough: Patient does have very minimal centrilobular micronodularity that is predominantly groundglass in the upper lungs. Cough is somewhat better. Still producing a yellow phlegm. No hemoptysis.   Moderate, persistent asthma: Previously on Breo. Inhalers discontinued at last appointment. Patient has also been on Symbicort. Continue on Singulair. Mild intermittent wheezing. She hasn't needed her rescue inhaler.   Chronic seasonal Allergic rhinitis: Currently prescribed Singulair & Flonase. Patient has declined evaluation by ENT as to etiology of cough. She has been using Dymista in place of Flonase. She has had increased sinus congestion & drainage. She does have post-nasal drainage.   GERD: Currently prescribed Protonix & Zantac. Followed by GI (Dr. Carlean Purl). No reflux or dyspepsia. No morning brash water taste.   Review of Systems  No fever, chills, or sweats. She has had some mild chest tightness. No chest pain or pressure except with cough. No abdominal pain or nausea.   Allergies  Allergen Reactions  . Latex Rash  . Adhesive [Tape] Other (See Comments)    TEARS SKIN  . Bupivacaine Other (See Comments)    Other Reaction: PRE-SYNCOPE, TACTYCARDIA TACHYCARDIA/ PRESYNCOPE  . Butorphanol Tartrate Other (See Comments)    Other Reaction: NUMBNESS, TACHYCARDIA  . Clarithromycin Nausea Only and Other (See Comments)    TACHYCARDIA  . Codeine Other (See Comments)    SEVERE ELEVATED LIVER ENZYMES  . Cymbalta [Duloxetine Hcl] Other (See Comments)    Blurred vision  . Dilaudid [Hydromorphone Hcl] Itching  . Elavil [Amitriptyline Hcl] Other (See Comments)    Elevated liver enzymes  . Hydrocodone Other (See Comments)    Elevated liver enzymes  . Hyoscyamine Sulfate Other (See Comments)    . Lactated Ringers Other (See Comments)    COULD CAUSE PANIC ATTACK  . Levbid [Hyoscyamine Sulfate] Other (See Comments)    Dizziness, nausea, headache  . Lisinopril Other (See Comments)    Dry cough  . Marcaine [Bupivacaine Hcl] Other (See Comments)    TACHYCARDIA, PRESYNCOPE  . Pentazocine Lactate Other (See Comments)    tachycardia  . Percodan [Oxycodone-Aspirin] Nausea Only  . Stadol [Butorphanol Tartrate] Swelling    Lips numb, tongue swell, tachycardia  . Trazodone And Nefazodone Other (See Comments)    tachycardia  . Vistaril [Hydroxyzine Hcl] Other (See Comments)    Altered sensorium  . Morphine And Related Itching and Rash    Current Outpatient Prescriptions on File Prior to Visit  Medication Sig Dispense Refill  . albuterol (VENTOLIN HFA) 108 (90 Base) MCG/ACT inhaler Inhale into the lungs every 6 (six) hours as needed for wheezing or shortness of breath.    . Azelastine-Fluticasone (DYMISTA) 137-50 MCG/ACT SUSP Place 1 spray into the nose daily. 1 Bottle 0  . BIOTIN PO Take by mouth.    . budesonide-formoterol (SYMBICORT) 160-4.5 MCG/ACT inhaler Inhale 2 puffs into the lungs 2 (two) times daily. 1 Inhaler 3  . Calcium Carbonate-Vitamin D (CALCIUM + D PO) Take by mouth daily. 1 tablet daily    . estradiol (VIVELLE-DOT) 0.1 MG/24HR Place 1 patch onto the skin 2 (two) times a week.    . ezetimibe (ZETIA) 10 MG tablet Take 10 mg by mouth daily.     . fish oil-omega-3 fatty acids 1000 MG capsule Take 1 g by mouth daily.     Marland Kitchen  Fluticasone Furoate-Vilanterol (BREO ELLIPTA IN) Inhale 1 puff into the lungs daily.    . folic acid (FOLVITE) 1 MG tablet Take 1 mg by mouth daily.    Marland Kitchen LORazepam (ATIVAN) 1 MG tablet 1/2 to 1 tab twice daily    . metoprolol (LOPRESSOR) 50 MG tablet Take 25 mg by mouth 2 (two) times daily.     . montelukast (SINGULAIR) 10 MG tablet Take 10 mg by mouth at bedtime.    . Multiple Vitamin (MULTIVITAMIN) tablet Take 1 tablet by mouth daily.    Marland Kitchen  olmesartan (BENICAR) 20 MG tablet Take 20 mg by mouth daily.    . pantoprazole (PROTONIX) 40 MG tablet Take 40 mg by mouth daily.    . ranitidine (ZANTAC) 150 MG tablet Take 1 tablet (150 mg total) by mouth at bedtime. 30 tablet 3  . sertraline (ZOLOFT) 50 MG tablet Take 50 mg by mouth daily.    . Vitamin D, Ergocalciferol, (DRISDOL) 50000 UNITS CAPS Take 50,000 Units by mouth every 7 (seven) days.       Current Facility-Administered Medications on File Prior to Visit  Medication Dose Route Frequency Provider Last Rate Last Dose  . 0.9 %  sodium chloride infusion  500 mL Intravenous Continuous Gatha Mayer, MD        Past Medical History:  Diagnosis Date  . Allergic rhinitis   . Allergy   . Anal fissure   . Anemia   . Anxiety   . Arthritis   . Asthma   . Bladder pain   . Complication of anesthesia PAIN ATTACKS   LACTATED RINGERS- CONTRAINDICATED  . Depression   . Fibromyalgia   . Frequency of urination   . GERD (gastroesophageal reflux disease)   . Heart murmur   . History of pancreatitis 1988-1989  . Hyperlipidemia   . Hypertension LABILE  . IBS (irritable bowel syndrome)   . Interstitial cystitis   . Migraines   . Normal cardiac stress test JULY 2009---  NORMAL  . 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 Spasm of sphincter of Oddi 1987  . Stroke ([place])   . Urethral stenosis S/P DILATIONS  . Urgency of urination     Past Surgical History:  Procedure Laterality Date  . ABDOMINAL HYSTERECTOMY  1988  . APPENDECTOMY  04/ 1986  . bilateral reduction mastopexy  1988  . bilateral turbinate resection  1990   nasal spreading graft  . CATARACT EXTRACTION, BILATERAL    . CHOLECYSTECTOMY  04/ 1986  . COLONOSCOPY  02/2004, 11/2010   2005: Normal - Dr. Sammuel Cooper 2012: Normal  . CYSTO WITH HYDRODISTENSION N/A 12/17/2013   Procedure: CYSTO HYDRODISTENSION WITH INSTALLATION OF CHLOROPACTIN AND TETRACAINE;   Surgeon: Malka So, MD;  Location[facility];  Service: Urology;  Laterality: N/A;  . CYSTO WITH HYDRODISTENSION N/A 06/02/2015   Procedure: CYSTOSCOPY/HYDRODISTENSION WITH INSTILLATION OF CLORPACTIN ;  Surgeon: [name], MD;  Location: [facility];  Service: Urology;  Laterality: N/A;  . CYSTOSCOPY WITH URETHRAL DILATATION  07/12/2011   Procedure: CYSTOSCOPY WITH URETHRAL DILATATION;  Surgeon: Malka So, MD;  Location: [facility];  Service: Urology;  Laterality: N/A;  HOD AND INSTILLATION OF CHLOROPACTIN C-ARM   . CYSTOSCOPY WITH URETHRAL DILATATION N/A 06/26/2012   Procedure: CYSTOSCOPY WITH URETHRAL DILATATION HYDRODISTENSION AND INSTILLATION OF CLORPACTION ;  Surgeon:  Malka So, MD;  Location: [facility];  Service: Urology;  Laterality: N/A;  CYSTOSCOPY WITH URETHRAL DILATATION HYDRODISTENSION AND INSTILLATION OF CLORPACTION   . CYSTOSTOMY W/ BLADDER BIOPSY  1983  . DILATION AND CURETTAGE OF UTERUS  1987  . ERCP  1988   for pancreatitis with stents   S/P SEVERE PANCREATITIS  . HEMORRHOID SURGERY    . LASIK Bilateral APRIL 2005  . MULTIPLE CYSTO/ HOD/ URETHRAL DILATION/ INSTILLATION CLORPACTIN  .LAST ONE 03-31-2010  . NASAL SINUS SURGERY  1977   sinus cyst  . REDUCTION MAMMAPLASTY Bilateral 1988  . revision rhinoplasty  1978  . RHINOPLASTY  1977  . RIGHT SALPINGOOPHECTOMY  1998  . TONSILLECTOMY AND ADENOIDECTOMY  1963    Family History  Problem Relation Age of Onset  . Colon cancer Maternal Grandmother   . Colon cancer Cousin   . Allergies Mother   . Asthma Mother   . Heart disease Mother   . Osteoarthritis Mother   . Diabetes Mother   . Irritable bowel syndrome Mother   . Brain cancer Father   . Osteoarthritis Father   . Colon polyps Father     adenomatous  . Heart disease Father   . Melanoma Sister   . Osteoarthritis Sister   . Irritable bowel syndrome Sister   . Diabetes Sister   . Heart disease  Sister   . Stroke Sister   . Ulcerative colitis Maternal Uncle   . Stomach cancer Neg Hx   . Rheumatologic disease Neg Hx     Social History   Social History  . Marital status: Married    Spouse name: N/A  . Number of children: 0  . Years of education: N/A   Occupational History  . RN    Social History Main Topics  . Smoking status: Passive Smoke Exposure - Never Smoker  . Smokeless tobacco: Never Used     Comment: Father smoked briefly  . Alcohol use No  . Drug use: No  . Sexual activity: Not Asked   Other Topics Concern  . None   Social History Narrative   Married, lives with spouse   No children   RN at [employer]   No recent travel      [place] [facility]:   Originally from [place]. Always lived in [place]. Previously has traveled to [place], [place], [place], [place], & mostly [place]. No indoor pets currently. She does have cats in her garage. No bird, mold, or hot tub exposure. No indoor plants. Mostly wood floors. She did have her bedroom carpet taken up in Summer 2017. Has mostly blinds. No wood burning fire place. Previously enjoyed Firefighter & playing piano.       Objective:   Physical Exam BP 128/76 (BP Location: Left Arm, Cuff Size: Normal)   Pulse 78   Ht 5\' 2"  (1.575 m)   Wt 140 lb (63.5 kg)   SpO2 96%   BMI 25.61 kg/m   Gen.: No distress. Caucasian female alert. HEENT: Moist mucous membrane. Mild bilateral nasal turbinate swelling. Mallampati class I. Abdomen: Soft. Nondistended. Normoactive bowel sounds. Pulmonary: Clear on auscultation. No accessory muscle use on room air. Intermittent cough witnessed. Cardiovascular: Regular rate. Regular rhythm. No edema or JVD appreciated.  PFT 04/18/16: FVC 2.06 L (76%) FEV1 1.84 L (80%) FEV1/FVC 0.89 FEF 25-75 3.03 L (156%) negative bronchodilator response TLC 43 L (108%) RV 139% ERV 25% DLCO uncorrected 67% 10/20/13: FVC 2.14 L (86%) FEV1 1.98 L (  92%) FEV1/FVC 0.82 FEF 25-75 2.14 L (104%) negative  bronchodilator response TLC 3.73 L (83%) RV 70% ERV 65% DLCO uncorrected 92%  IMAGING CT CHEST W/O 07/17/16 (personally reviewed by me): Upper lobe predominant centrilobular groundglass micronodules. He with minimal subpleural reticulation. Seems to have progressed somewhat. No pleural effusion or thickening. No pericardial effusion. No pathologic mediastinal adenopathy.  CXR PA/LAT 06/07/16 (Previously reviewed by me):  No focal opacity or mass appreciated. No pleural effusion. Heart normal in size & mediastinum normal in contour.  MAXILLOFACIAL CT LTD W/O 04/19/16 (per radiologist): Visualized paranasal sinuses are essentially clear.  BARIUM SWALLOW/ESOPHAGRAM 04/16/16 (per radiologist):  Mild gastroesophageal reflux. No stricture.   CT CHEST W/O 03/28/15 (previously reviewed by me):  No parenchymal nodule or opacity appreciated. No pleural effusion or thickening. No pericardial effusion. No pathologic mediastinal adenopathy.  MICROBIOLOGY Sputum Culture (06/14/16):  Normal Oral Flora / AFB pending / Fungus pending Sputum Culture (04/19/16):  Scant Yeast not identified / AFB negative / Normal Oral Flora Sputum Culture (05/04/16):  Normal Oral Flora  LABS 04/18/16 CBC: 9.9/13.8/40.0/431 IgM: 129 IgA: 139 IgG: 587 IgE: <2 RAST panel: Negative  06/02/15 HGB:  15.3    Assessment & Plan:  66 y.o. female with persistent cough, moderate, persistent asthma, chronic seasonal allergic rhinitis, & GERD. I reviewed the patient's CT scan with her today which does show some very minimal centrilobular groundglass nodules. She has no exposures or history to suggest hypersensitivity pneumonitis nor does she have any ongoing tobacco use distant just a smoking-related lung disease. An atypical infection is certainly possible and we did discuss bronchoscopy with airway inspection and lavage. I reviewed the risks of the procedure including bleeding, infection, pneumothorax, vocal cord injury, medication  allergy, and potentially death. The patient is willing to undergo the procedure. Overall she does continue to have problems from her allergic rhinitis which could certainly be a major factor and may require ENT evaluation depending upon the results of her bronchoscopy.  1. Cough:  Planning for bronchoscopy the week of 4/23 with airway inspection and lavage for bilateral upper lobes. If this is unrevealing plan for evaluation by ENT. 2. Moderate, persistent asthma: Continuing to hold on inhaler therapy at this time. No new medications. 3. Chronic seasonal allergic rhinitis: Patient given sample of Diovan listed today. Continuing Singulair. 4. GERD: Well-controlled. Continuing Protonix & Zantac. 5. Health maintenance:  Status post Prevnar March 2018, Pneumovax October 2014 & influenza vaccine February 2018.  6. Follow-up: Return to clinic in 4 weeks or sooner if needed.  Sonia Baller Ashok Cordia, M.D. [facility] Pulmonary & Critical Care Pager:  [phone] After 3pm or if no response, call [phone] 9:15 AM 08/10/16

## 2016-08-23 NOTE — Progress Notes (Signed)
Video bronchoscopy done  Intervention bronchial washing Pt tolerated well

## 2016-08-23 NOTE — Telephone Encounter (Signed)
Received message from Brenda [name] stating patient needed dymista samples. Samples were retrieved and placed in pick up. Pt was called and message left instructing she can pick up samples. Nothing further is needed.

## 2016-08-24 ENCOUNTER — Ambulatory Visit (INDEPENDENT_AMBULATORY_CARE_PROVIDER_SITE_OTHER): Payer: Worker's Compensation | Admitting: Orthopaedic Surgery

## 2016-08-24 DIAGNOSIS — M25562 Pain in left knee: Secondary | ICD-10-CM

## 2016-08-24 LAB — ACID FAST SMEAR (AFB, MYCOBACTERIA)
Acid Fast Smear: NEGATIVE
Acid Fast Smear: NEGATIVE

## 2016-08-24 NOTE — Progress Notes (Signed)
Office Visit Note   Patient: Brenda [name]           Date of Birth: [birth year]           MRN: [id number] Visit Date: 08/24/2016              Requested by: Prince Solian, MD [address] [place], [place] [address] PCP: Tivis Ringer, MD   Assessment & Plan: Visit Diagnoses:  1. Acute pain of left knee   Workman's comp injury left knee with MRI scan demonstrating possible tear of the posterior cruciate ligament and partial tear of the medial collateral ligament associated with mild tricompartmental degenerative arthritis  Plan: Brenda [name] is actually doing quite well in terms of her left knee she is working full time. On occasion she has some achiness in her knees there probably related to the weather and arthritis but does not have any functional limitations. I think from a workman's comp standpoint she is fine and may continue working full full time. She has reached maximum medical improvement and will not have any permanent partial impairment. We'll see back on a when necessary basis  Follow-Up Instructions: Return if symptoms worsen or fail to improve.   Orders:  No orders of the defined types were placed in this encounter.  No orders of the defined types were placed in this encounter.     Procedures: No procedures performed   Clinical Data: No additional findings.   Subjective: Chief Complaint  Patient presents with  . Left Knee - Pain  Brenda [name] sustained an on-the-job injury to her left knee as previously outlined. She had an MRI scan of the left knee about 5 weeks ago demonstrating a probable tear of the posterior cruciate ligament. The anterior cruciate ligament was intact. She had a mild strain of the medial collateral ligament and mild to moderate chondral thinning in all 3 compartments. She did have a popliteal cyst. She is actually doing fairly well without any significant limitations in terms of her knee. She's not had any swelling or sensation of her knee  giving way. She does have some achiness when the weather is inclement  HPI  Review of Systems   Objective: Vital Signs: There were no vitals taken for this visit.  Physical Exam  Ortho Exam left knee is examined and compared to the right knee. There was no left knee effusion. No medial or lateral joint pain. No patellar discomfort with flexion and extension. I thought she had a slightly greater posterior drawer sign on the left compared to the right but was not uncomfortable. There was some fullness in the popliteal space but no redness or ecchymosis. No calf pain. Neurovascular exam is intact. She walks without a limp.  Specialty Comments:  No specialty comments available.  Imaging: No results found.   PMFS History: Patient Active Problem List   Diagnosis Date Noted  . Persistent cough   . Moderate persistent asthma 03/19/2016  . Chronic seasonal allergic rhinitis 03/19/2016  . GERD (gastroesophageal reflux disease) 03/19/2016  . Cough 03/19/2016  . Essential hypertension, benign 03/19/2016  . H/O: CVA (cerebrovascular accident) 03/19/2016  . IBS (irritable bowel syndrome) 03/19/2016  . Interstitial cystitis 03/19/2016  . Fibromyalgia 03/19/2016  . DJD (degenerative joint disease) 03/19/2016  . Arthritis 03/19/2016  . Sphincter of Oddi dysfunction 03/19/2016  . H/O acute pancreatitis 03/19/2016   Past Medical History:  Diagnosis Date  . Allergic rhinitis   . Allergy   . Anal fissure   .  Anemia   . Anxiety   . Arthritis   . Asthma   . Bladder pain   . Complication of anesthesia PAIN ATTACKS   LACTATED RINGERS- CONTRAINDICATED  . Depression   . Fibromyalgia   . Frequency of urination   . GERD (gastroesophageal reflux disease)   . Heart murmur   . History of pancreatitis 1988-1989  . Hyperlipidemia   . Hypertension LABILE  . IBS (irritable bowel syndrome)   . Interstitial cystitis   . Migraines   . Normal cardiac stress test JULY 2009---  NORMAL  . Remote  history of stroke 1996-   MILD  W/ NO RESIDUALS   AND PER SCAN CVA  IN 2010  (INFARCTION LEFT THALAMUS)  . Spasm of sphincter of Oddi 1987  . Stroke ([place])   . Urethral stenosis S/P DILATIONS  . Urgency of urination     Family History  Problem Relation Age of Onset  . Colon cancer Maternal Grandmother   . Colon cancer Cousin   . Allergies Mother   . Asthma Mother   . Heart disease Mother   . Osteoarthritis Mother   . Diabetes Mother   . Irritable bowel syndrome Mother   . Brain cancer Father   . Osteoarthritis Father   . Colon polyps Father     adenomatous  . Heart disease Father   . Melanoma Sister   . Osteoarthritis Sister   . Irritable bowel syndrome Sister   . Diabetes Sister   . Heart disease Sister   . Stroke Sister   . Ulcerative colitis Maternal Uncle   . Stomach cancer Neg Hx   . Rheumatologic disease Neg Hx     Past Surgical History:  Procedure Laterality Date  . ABDOMINAL HYSTERECTOMY  1988  . APPENDECTOMY  04/ 1986  . bilateral reduction mastopexy  1988  . bilateral turbinate resection  1990   nasal spreading graft  . CATARACT EXTRACTION, BILATERAL    . CHOLECYSTECTOMY  04/ 1986  . COLONOSCOPY  02/2004, 11/2010   2005: Normal - Dr. Sammuel Cooper 2012: Normal  . CYSTO WITH HYDRODISTENSION N/A 12/17/2013   Procedure: CYSTO HYDRODISTENSION WITH INSTALLATION OF CHLOROPACTIN AND TETRACAINE;  Surgeon: Malka So, MD;  Location: [facility];  Service: Urology;  Laterality: N/A;  . CYSTO WITH HYDRODISTENSION N/A 06/02/2015   Procedure: CYSTOSCOPY/HYDRODISTENSION WITH INSTILLATION OF CLORPACTIN ;  Surgeon: [name], MD;  Location[facility];  Service: Urology;  Laterality: N/A;  . CYSTOSCOPY WITH URETHRAL DILATATION  07/12/2011   Procedure: CYSTOSCOPY WITH URETHRAL DILATATION;  Surgeon: Malka So, MD;  Location: [facility];  Service: Urology;  Laterality: N/A;  HOD AND INSTILLATION OF CHLOROPACTIN C-ARM   . CYSTOSCOPY  WITH URETHRAL DILATATION N/A 06/26/2012   Procedure: CYSTOSCOPY WITH URETHRAL DILATATION HYDRODISTENSION AND INSTILLATION OF CLORPACTION ;  Surgeon: Malka So, MD;  Location: [facility];  Service: Urology;  Laterality: N/A;  CYSTOSCOPY WITH URETHRAL DILATATION HYDRODISTENSION AND INSTILLATION OF CLORPACTION   . CYSTOSTOMY W/ BLADDER BIOPSY  1983  . DILATION AND CURETTAGE OF UTERUS  1987  . ERCP  1988   for pancreatitis with stents   S/P SEVERE PANCREATITIS  . HEMORRHOID SURGERY    . LASIK Bilateral APRIL 2005  . MULTIPLE CYSTO/ HOD/ URETHRAL DILATION/ INSTILLATION CLORPACTIN  .LAST ONE 03-31-2010  . NASAL SINUS SURGERY  1977   sinus cyst  . REDUCTION MAMMAPLASTY Bilateral 1988  . revision rhinoplasty  1978  .  RHINOPLASTY  1977  . RIGHT SALPINGOOPHECTOMY  1998  . TONSILLECTOMY AND ADENOIDECTOMY  1963   Social History   Occupational History  . RN    Social History Main Topics  . Smoking status: Passive Smoke Exposure - Never Smoker  . Smokeless tobacco: Never Used     Comment: Father smoked briefly  . Alcohol use No  . Drug use: No  . Sexual activity: Not on file     Garald Balding, MD   Note - This record has been created using [employer].  Chart creation errors have been sought, but may not always  have been located. Such creation errors do not reflect on  the standard of medical care.

## 2016-08-25 LAB — CULTURE, BAL-QUANTITATIVE W GRAM STAIN: Culture: 20000 — AB

## 2016-08-25 LAB — CULTURE, BAL-QUANTITATIVE: Special Requests: NORMAL

## 2016-08-26 LAB — CULTURE, BAL-QUANTITATIVE W GRAM STAIN: Culture: 200 — AB

## 2016-08-27 ENCOUNTER — Encounter (HOSPITAL_COMMUNITY): Payer: Self-pay | Admitting: Pulmonary Disease

## 2016-08-28 LAB — ASPERGILLUS ANTIGEN, BAL/SERUM: Aspergillus Ag, BAL/Serum: 0.05 Index (ref 0.00–0.49)

## 2016-09-13 ENCOUNTER — Ambulatory Visit (INDEPENDENT_AMBULATORY_CARE_PROVIDER_SITE_OTHER): Payer: PRIVATE HEALTH INSURANCE | Admitting: Pulmonary Disease

## 2016-09-13 ENCOUNTER — Other Ambulatory Visit (INDEPENDENT_AMBULATORY_CARE_PROVIDER_SITE_OTHER): Payer: PRIVATE HEALTH INSURANCE

## 2016-09-13 ENCOUNTER — Encounter: Payer: Self-pay | Admitting: Pulmonary Disease

## 2016-09-13 VITALS — BP 124/68 | HR 88 | Ht 62.0 in | Wt 142.0 lb

## 2016-09-13 DIAGNOSIS — J4540 Moderate persistent asthma, uncomplicated: Secondary | ICD-10-CM

## 2016-09-13 DIAGNOSIS — J302 Other seasonal allergic rhinitis: Secondary | ICD-10-CM

## 2016-09-13 DIAGNOSIS — R059 Cough, unspecified: Secondary | ICD-10-CM

## 2016-09-13 DIAGNOSIS — R05 Cough: Secondary | ICD-10-CM

## 2016-09-13 LAB — ANGIOTENSIN CONVERTING ENZYME: Angiotensin-Converting Enzyme: 57 U/L (ref 9–67)

## 2016-09-13 LAB — SEDIMENTATION RATE: Sed Rate: 17 mm/hr (ref 0–30)

## 2016-09-13 LAB — C-REACTIVE PROTEIN: CRP: 0.2 mg/dL — ABNORMAL LOW (ref 0.5–20.0)

## 2016-09-13 MED ORDER — MOXIFLOXACIN HCL 400 MG PO TABS: 400.0000 mg | ORAL_TABLET | Freq: Every day | ORAL | 0 refills | Status: DC

## 2016-09-13 NOTE — Patient Instructions (Addendum)
   Call me if you notice your cough is getting worse or you stop coughing up blood.  If your blood work is negative my next step is to refer you to [facility] for evaluation.  TESTS ORDERED: 1. Sputum AFB, Fungus, & Bacteria today. 2. Serum ANA, ESR, CRP, ACE, ANCA Panel, Rheumatoid Factor, Anti-CCP, Smith Antibody, DS DNA Ab, Myosoitis Panel, Histone Antibody, & Sjogrens Panel today.

## 2016-09-13 NOTE — Progress Notes (Signed)
Subjective:    Patient ID: Brenda [name], female    DOB: [birth year], 66 y.o.   MRN: [id number]  C.C.:  Follow-up for Cough, Moderate, Persistent Asthma, GERD, & Chronic Seasonal Allergic Rhinitis.   HPI  Since last appointment patient underwent bronchoscopy with airway inspection and lavage with culture. Patient was found to have inflamed endobronchial mucosa with some blood-tinged secretions during lavage of right middle lobe.\  Cough:  Her cough is continuing with production of a clear sinus mucus and cloudy chest mucus. The cough doesn't seem to be improving.   Moderate, persistent asthma: Previously tried on Breo and Symbicort without relief of cough. Currently on Singulair. Mild wheezing. No new dyspnea. She used her Albuterol the other day. No exacerbations since last appointment.   Chronic seasonal allergic rhinitis: Currently prescribed Singulair and Flonase. Given sample of diagnosed at last appointment to use in place of Flonase with some symptomatic relief.  GERD: Prescribed Protonix and Zantac. Followed by GI (Dr. Carlean Purl). No reflux or dyspepsia.   Review of Systems  No fever or chills. No chest pain or pressure except with her cough. No dysuria or hematuria.   Allergies  Allergen Reactions  . Latex Rash  . Adhesive [Tape] Other (See Comments)    TEARS SKIN  . Bupivacaine Other (See Comments)    Other Reaction: PRE-SYNCOPE, TACTYCARDIA TACHYCARDIA/ PRESYNCOPE  . Butorphanol Tartrate Other (See Comments)    Other Reaction: NUMBNESS, TACHYCARDIA  . Clarithromycin Nausea Only and Other (See Comments)    TACHYCARDIA  . Codeine Other (See Comments)    SEVERE ELEVATED LIVER ENZYMES  . Cymbalta [Duloxetine Hcl] Other (See Comments)    Blurred vision  . Dilaudid [Hydromorphone Hcl] Itching  . Elavil [Amitriptyline Hcl] Other (See Comments)    Elevated liver enzymes  . Hydrocodone Other (See Comments)    Elevated liver enzymes  . Hyoscyamine Sulfate Other (See  Comments)  . Lactated Ringers Other (See Comments)    COULD CAUSE PANIC ATTACK  . Levbid [Hyoscyamine Sulfate] Other (See Comments)    Dizziness, nausea, headache  . Lisinopril Other (See Comments)    Dry cough  . Marcaine [Bupivacaine Hcl] Other (See Comments)    TACHYCARDIA, PRESYNCOPE  . Pentazocine Lactate Other (See Comments)    tachycardia  . Percodan [Oxycodone-Aspirin] Nausea Only  . Stadol [Butorphanol Tartrate] Swelling    Lips numb, tongue swell, tachycardia  . Trazodone And Nefazodone Other (See Comments)    tachycardia  . Vistaril [Hydroxyzine Hcl] Other (See Comments)    Altered sensorium  . Morphine And Related Itching and Rash    Current Outpatient Prescriptions on File Prior to Visit  Medication Sig Dispense Refill  . albuterol (VENTOLIN HFA) 108 (90 Base) MCG/ACT inhaler Inhale into the lungs every 6 (six) hours as needed for wheezing or shortness of breath.    . Azelastine-Fluticasone (DYMISTA) 137-50 MCG/ACT SUSP Place 1 spray into the nose daily. 2 Bottle 0  . Calcium Carbonate-Vitamin D (CALCIUM + D PO) Take 1 tablet by mouth daily.     Marland Kitchen estradiol (VIVELLE-DOT) 0.1 MG/24HR Place 1 patch onto the skin once a week. Wednesdays    . ezetimibe (ZETIA) 10 MG tablet Take 10 mg by mouth daily.     . folic acid (FOLVITE) 1 MG tablet Take 1 mg by mouth daily.    Marland Kitchen LORazepam (ATIVAN) 1 MG tablet Take 1 mg by mouth daily.     . metoprolol (LOPRESSOR) 50 MG tablet Take 25 mg by  mouth 2 (two) times daily.     . montelukast (SINGULAIR) 10 MG tablet Take 10 mg by mouth at bedtime.    . Multiple Vitamin (MULTIVITAMIN) tablet Take 1 tablet by mouth daily.    Marland Kitchen olmesartan (BENICAR) 20 MG tablet Take 20 mg by mouth daily.    . pantoprazole (PROTONIX) 40 MG tablet Take 40 mg by mouth daily.    . ranitidine (ZANTAC) 150 MG tablet Take 1 tablet (150 mg total) by mouth at bedtime. 30 tablet 3  . sertraline (ZOLOFT) 50 MG tablet Take 50 mg by mouth daily.    . Vitamin D,  Ergocalciferol, (DRISDOL) 50000 UNITS CAPS Take 50,000 Units by mouth every 7 (seven) days.       No current facility-administered medications on file prior to visit.     Past Medical History:  Diagnosis Date  . Allergic rhinitis   . Allergy   . Anal fissure   . Anemia   . Anxiety   . Arthritis   . Asthma   . Bladder pain   . Complication of anesthesia PAIN ATTACKS   LACTATED RINGERS- CONTRAINDICATED  . Depression   . Fibromyalgia   . Frequency of urination   . GERD (gastroesophageal reflux disease)   . Heart murmur   . History of pancreatitis 1988-1989  . Hyperlipidemia   . Hypertension LABILE  . IBS (irritable bowel syndrome)   . Interstitial cystitis   . Migraines   . Normal cardiac stress test JULY 2009---  NORMAL  . 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 Spasm of sphincter of Oddi 1987  . Stroke ([place])   . Urethral stenosis S/P DILATIONS  . Urgency of urination     Past Surgical History:  Procedure Laterality Date  . ABDOMINAL HYSTERECTOMY  1988  . APPENDECTOMY  04/ 1986  . bilateral reduction mastopexy  1988  . bilateral turbinate resection  1990   nasal spreading graft  . CATARACT EXTRACTION, BILATERAL    . CHOLECYSTECTOMY  04/ 1986  . COLONOSCOPY  02/2004, 11/2010   2005: Normal - Dr. Sammuel Cooper 2012: Normal  . CYSTO WITH HYDRODISTENSION N/A 12/17/2013   Procedure: CYSTO HYDRODISTENSION WITH INSTALLATION OF CHLOROPACTIN AND TETRACAINE;  Surgeon: Malka So, MD;  Location: [facility];  Service: Urology;  Laterality: N/A;  . CYSTO WITH HYDRODISTENSION N/A 06/02/2015   Procedure: CYSTOSCOPY/HYDRODISTENSION WITH INSTILLATION OF CLORPACTIN ;  Surgeon: [name], MD;  Location: [facility];  Service: Urology;  Laterality: N/A;  . CYSTOSCOPY WITH URETHRAL DILATATION  07/12/2011   Procedure: CYSTOSCOPY WITH URETHRAL DILATATION;  Surgeon: Malka So, MD;   Location: [facility];  Service: Urology;  Laterality: N/A;  HOD AND INSTILLATION OF CHLOROPACTIN C-ARM   . CYSTOSCOPY WITH URETHRAL DILATATION N/A 06/26/2012   Procedure: CYSTOSCOPY WITH URETHRAL DILATATION HYDRODISTENSION AND INSTILLATION OF CLORPACTION ;  Surgeon: Malka So, MD;  Location: [facility];  Service: Urology;  Laterality: N/A;  CYSTOSCOPY WITH URETHRAL DILATATION HYDRODISTENSION AND INSTILLATION OF CLORPACTION   . CYSTOSTOMY W/ BLADDER BIOPSY  1983  . DILATION AND CURETTAGE OF UTERUS  1987  . ERCP  1988   for pancreatitis with stents   S/P SEVERE PANCREATITIS  . HEMORRHOID SURGERY    . LASIK Bilateral APRIL 2005  . MULTIPLE CYSTO/ HOD/ URETHRAL DILATION/ INSTILLATION CLORPACTIN  .LAST ONE 03-31-2010  .  NASAL SINUS SURGERY  1977   sinus cyst  . REDUCTION MAMMAPLASTY Bilateral 1988  . revision rhinoplasty  1978  . RHINOPLASTY  1977  . RIGHT SALPINGOOPHECTOMY  1998  . TONSILLECTOMY AND ADENOIDECTOMY  1963  . VIDEO BRONCHOSCOPY Bilateral 08/23/2016   Procedure: VIDEO BRONCHOSCOPY WITHOUT FLUORO;  Surgeon: Javier Glazier, MD;  Location: [facility];  Service: Cardiopulmonary;  Laterality: Bilateral;    Family History  Problem Relation Age of Onset  . Colon cancer Maternal Grandmother   . Colon cancer Cousin   . Allergies Mother   . Asthma Mother   . Heart disease Mother   . Osteoarthritis Mother   . Diabetes Mother   . Irritable bowel syndrome Mother   . Brain cancer Father   . Osteoarthritis Father   . Colon polyps Father        adenomatous  . Heart disease Father   . Melanoma Sister   . Osteoarthritis Sister   . Irritable bowel syndrome Sister   . Diabetes Sister   . Heart disease Sister   . Stroke Sister   . Ulcerative colitis Maternal Uncle   . Stomach cancer Neg Hx   . Rheumatologic disease Neg Hx     Social History   Social History  . Marital status: Married    Spouse name: N/A  . Number of children: 0  . Years  of education: N/A   Occupational History  . RN    Social History Main Topics  . Smoking status: Passive Smoke Exposure - Never Smoker  . Smokeless tobacco: Never Used     Comment: Father smoked briefly  . Alcohol use No  . Drug use: No  . Sexual activity: Not Asked   Other Topics Concern  . None   Social History Narrative   Married, lives with spouse   No children   RN at [employer]   No recent travel      [facility]:   Originally from [place]. Always lived in [place]. Previously has traveled to [place], [place], [place], [place], & mostly [place]. No indoor pets currently. She does have cats in her garage. No bird, mold, or hot tub exposure. No indoor plants. Mostly wood floors. She did have her bedroom carpet taken up in Summer 2017. Has mostly blinds. No wood burning fire place. Previously enjoyed Firefighter & playing piano.       Objective:   Physical Exam BP 124/68 (BP Location: Left Arm, Cuff Size: Normal)   Pulse 88   Ht _0  (1.575 m)   Wt 142 lb (64.4 kg)   SpO2 94%   BMI 25.97 kg/m   Gen.: Comfortable. No distress. Alert. HEENT: Continued mild bilateral nasal turbinate swelling with erythematous mucosa. No oral ulcers. Cardiovascular: Regular rate. Regular rhythm. No edema. Pulmonary: Clear to auscultation. No accessory muscle use on room air. Abdomen: Soft. Nondistended. Normal bowel sounds.  PFT 04/18/16: FVC 2.06 L (76%) FEV1 1.84 L (80%) FEV1/FVC 0.89 FEF 25-75 3.03 L (156%) negative bronchodilator response TLC 43 L (108%) RV 139% ERV 25% DLCO uncorrected 67% 10/20/13: FVC 2.14 L (86%) FEV1 1.98 L (92%) FEV1/FVC 0.82 FEF 25-75 2.14 L (104%) negative bronchodilator response TLC 3.73 L (83%) RV 70% ERV 65% DLCO uncorrected 92%  IMAGING CT CHEST W/O 07/17/16 (previously reviewed by me): Upper lobe predominant centrilobular groundglass micronodules. He with minimal subpleural reticulation. Seems to have progressed somewhat. No pleural effusion or  thickening. No pericardial effusion. No pathologic mediastinal adenopathy.  CXR PA/LAT 06/07/16 (previously reviewed by me):  No focal opacity or mass appreciated. No pleural effusion. Heart normal in size & mediastinum normal in contour.  MAXILLOFACIAL CT LTD W/O 04/19/16 (per radiologist): Visualized paranasal sinuses are essentially clear.  BARIUM SWALLOW/ESOPHAGRAM 04/16/16 (per radiologist):  Mild gastroesophageal reflux. No stricture.   CT CHEST W/O 03/28/15 (previously reviewed by me):  No parenchymal nodule or opacity appreciated. No pleural effusion or thickening. No pericardial effusion. No pathologic mediastinal adenopathy.  MICROBIOLOGY Right Middle Lobe BAL (08/23/16):  Normal Respiratory Flora / AFB pending / Fungus pending / Aspergillus Antigen 0.05 Right Upper Lobe BAL (08/23/16):  Normal Respiratory Flora / AFB pending / Fungus pending Sputum Culture (06/14/16):  Normal Oral Flora / AFB negative / Scant growth Yeast without further growth Sputum Culture (04/19/16):  Scant Yeast not identified / AFB negative / Normal Oral Flora Sputum Culture (05/04/16):  Normal Oral Flora  LABS 04/18/16 CBC: 9.9/13.8/40.0/431 IgM: 129 IgA: 139 IgG: 587 IgE: <2 RAST panel: Negative  06/02/15 HGB:  15.3    Assessment & Plan:  66 y.o. female with persistent cough, moderate persistent asthma, chronic seasonal allergic rhinitis, & GERD. Patient also with some tiny, subcentimeter nodules noted on imaging. Cultures thus far have been negative from her lavage.We did discuss that with her airway inflammation autoimmune processes are also possible. As such, I'm sending in a serum workup today. We also discussed the potential need for a tertiary care center referral to allow for an independent assessment. Given her somewhat purulent secretions that she is producing I am prescribing an antibiotic today while repeating her culture. I instructed the patient contact me for any further breathing problems or  questions.  1. Cough:  Repeating sputum culture for AFB, fungus, and bacteria today. Awaiting finalization of lavage cultures. Checking serum autoimmune workup. If autoimmune workup and cultures are negative plan for tertiary center referral to [facility]. Empiric treatment with Avelox for 7 days for now. 2. Multiple lung nodules: Likely associated with her airway findings on bronchoscopy. Checking serum autoimmune workup. 3. Moderate, persistent asthma: 4. Chronic seasonal allergic rhinitis: Continuing Singulair. Holding off on repeat sinus CT imaging. 5. GERD: No evidence on bronchoscopy of silent reflux. Continuing Zantac. Followed by GI. 6. Health maintenance: Status post Prevnar March 2018, Pneumovax October 2014 & influenza vaccine February 2018.  7. Follow-up: Return to clinic in 3 months or sooner if needed.  Sonia Baller Ashok Cordia, M.D. [facility] Pager:  [phone] After 3pm or if no response, call [phone] 10:31 AM 09/13/16

## 2016-09-14 LAB — ANCA SCREEN W REFLEX TITER: ANCA Screen: NEGATIVE

## 2016-09-14 LAB — SJOGRENS SYNDROME-B EXTRACTABLE NUCLEAR ANTIBODY: SSB (La) (ENA) Antibody, IgG: <1

## 2016-09-14 LAB — CYCLIC CITRUL PEPTIDE ANTIBODY, IGG: Cyclic Citrullin Peptide Ab: <16 Units

## 2016-09-14 LAB — ANA: Anti Nuclear Antibody(ANA): NEGATIVE

## 2016-09-14 LAB — RHEUMATOID FACTOR: Rhuematoid fact SerPl-aCnc: <14 IU/mL (ref <14)

## 2016-09-14 LAB — ANTI-DNA ANTIBODY, DOUBLE-STRANDED: ds DNA Ab: <1 IU/mL

## 2016-09-14 LAB — SJOGRENS SYNDROME-A EXTRACTABLE NUCLEAR ANTIBODY: SSA (Ro) (ENA) Antibody, IgG: <1

## 2016-09-14 LAB — ANTI-SMITH ANTIBODY: ENA SM Ab Ser-aCnc: <1

## 2016-09-15 LAB — HISTONE ANTIBODIES, IGG, BLOOD: Histone Ab: 2.1 U — ABNORMAL HIGH (ref <1.0)

## 2016-09-16 LAB — RESPIRATORY CULTURE OR RESPIRATORY AND SPUTUM CULTURE: Organism ID, Bacteria: NORMAL

## 2016-09-20 LAB — MYOSITIS PANEL III
EJ*: NEGATIVE
Jo-1 (WB)*: NEGATIVE
Ku*: NEGATIVE
Mi-2 antibodies*: NEGATIVE
OJ*: NEGATIVE
PL-12*: NEGATIVE
PL-7*: NEGATIVE
PM-Scl 100*: POSITIVE — AB
PM-Scl 75*: NEGATIVE
RNP: 1.9 EU/ml
RO-52*: NEGATIVE
Signal Recognition Particle*: NEGATIVE

## 2016-09-21 ENCOUNTER — Encounter (INDEPENDENT_AMBULATORY_CARE_PROVIDER_SITE_OTHER): Payer: Self-pay | Admitting: Orthopaedic Surgery

## 2016-09-21 ENCOUNTER — Ambulatory Visit (INDEPENDENT_AMBULATORY_CARE_PROVIDER_SITE_OTHER): Payer: PRIVATE HEALTH INSURANCE | Admitting: Orthopaedic Surgery

## 2016-09-21 ENCOUNTER — Other Ambulatory Visit (INDEPENDENT_AMBULATORY_CARE_PROVIDER_SITE_OTHER): Payer: Self-pay

## 2016-09-21 ENCOUNTER — Other Ambulatory Visit: Payer: Self-pay | Admitting: Urology

## 2016-09-21 VITALS — BP 118/80 | HR 82 | Resp 14 | Ht 63.0 in | Wt 155.0 lb

## 2016-09-21 DIAGNOSIS — G8929 Other chronic pain: Secondary | ICD-10-CM

## 2016-09-21 DIAGNOSIS — M25512 Pain in left shoulder: Secondary | ICD-10-CM | POA: Diagnosis not present

## 2016-09-21 LAB — FUNGUS CULTURE WITH STAIN

## 2016-09-21 LAB — FUNGAL ORGANISM REFLEX

## 2016-09-21 LAB — FUNGUS CULTURE RESULT

## 2016-09-21 MED ORDER — BUPIVACAINE HCL 0.5 % IJ SOLN
2.0000 mL | INTRAMUSCULAR | Status: AC | PRN
  Administered 2016-09-21: 2 mL via INTRA_ARTICULAR

## 2016-09-21 MED ORDER — LIDOCAINE HCL 1 % IJ SOLN
2.0000 mL | INTRAMUSCULAR | Status: AC | PRN
  Administered 2016-09-21: 2 mL

## 2016-09-21 MED ORDER — METHYLPREDNISOLONE ACETATE 40 MG/ML IJ SUSP
80.0000 mg | INTRAMUSCULAR | Status: AC | PRN
  Administered 2016-09-21: 80 mg

## 2016-09-21 NOTE — Progress Notes (Signed)
Office Visit Note   Patient: Brenda [name]           Date of Birth: Aug 02, 1950           MRN: [id number] Visit Date: 09/21/2016              Requested by: Prince Solian, MD [address] [place], [place] [address] PCP: Prince Solian, MD   Assessment & Plan: Visit Diagnoses:  1. Chronic left shoulder pain    Positive impingement with possible rotator cuff tear Plan: Repeat subacromial cortisone injection left shoulder. MRI scan left shoulder and follow-up after scan. Long discussion regarding treatment options she like to have another shot of cortisone and I'll obtain  An MRI scan to delineate the pathology.  Follow-Up Instructions: Return after MRI left shoulder.   Orders:  No orders of the defined types were placed in this encounter.  No orders of the defined types were placed in this encounter.     Procedures: Large Joint Inj Date/Time: 09/21/2016 9:32 AM Performed by: Garald Balding Authorized by: Garald Balding   Consent Given by:  Patient Timeout: prior to procedure the correct patient, procedure, and site was verified   Indications:  Pain Location:  Shoulder Site:  L subacromial bursa Prep: patient was prepped and draped in usual sterile fashion   Needle Size:  25 G Needle Length:  1.5 inches Approach:  Lateral Ultrasound Guidance: No   Fluoroscopic Guidance: No   Arthrogram: No   Medications:  80 mg methylPREDNISolone acetate 40 MG/ML; 2 mL lidocaine 1 %; 2 mL bupivacaine 0.5 % Aspiration Attempted: No   Patient tolerance:  Patient tolerated the procedure well with no immediate complications     Clinical Data: No additional findings.   Subjective: Chief Complaint  Patient presents with  . Left Shoulder - Pain, Edema  Brenda [name] has had recurrent problems with left shoulder pain. Approximately 5-6 weeks ago I injected the subacromial region of the left shoulder with evidence of impingement. She has a "good result "only to have it recur.  She's having difficulty raising her arm overhead and sleeping on that side with occasional popping and clicking. She's had some referred pain into the biceps area. Neurovascular exam intact. She doesn't remember any injury or trauma  HPI  Review of Systems   Objective: Vital Signs: BP 118/80   Pulse 82   Resp 14   Ht 5\' 3"  (1.6 m)   Wt 155 lb (70.3 kg)   BMI 27.46 kg/m   Physical Exam  Ortho Exam Left shoulder with full overhead range of motion but with a circuitous arc of motion. Positive impingement on the extreme of external rotation. No popping or clicking. Pain localized in the anterior lateral subacromial region. Biceps intact. Speed sign negative. Good strength. Neurovascular exam intact. Specialty Comments:  No specialty comments available.  Imaging: No results found.   PMFS History: Patient Active Problem List   Diagnosis Date Noted  . Persistent cough   . Moderate persistent asthma 03/19/2016  . Chronic seasonal allergic rhinitis 03/19/2016  . GERD (gastroesophageal reflux disease) 03/19/2016  . Cough 03/19/2016  . Essential hypertension, benign 03/19/2016  . H/O: CVA (cerebrovascular accident) 03/19/2016  . IBS (irritable bowel syndrome) 03/19/2016  . Interstitial cystitis 03/19/2016  . Fibromyalgia 03/19/2016  . DJD (degenerative joint disease) 03/19/2016  . Arthritis 03/19/2016  . Sphincter of Oddi dysfunction 03/19/2016  . H/O acute pancreatitis 03/19/2016   Past Medical History:  Diagnosis Date  .  Allergic rhinitis   . Allergy   . Anal fissure   . Anemia   . Anxiety   . Arthritis   . Asthma   . Bladder pain   . Complication of anesthesia PAIN ATTACKS   LACTATED RINGERS- CONTRAINDICATED  . Depression   . Fibromyalgia   . Frequency of urination   . GERD (gastroesophageal reflux disease)   . Heart murmur   . History of pancreatitis 1988-1989  . Hyperlipidemia   . Hypertension LABILE  . IBS (irritable bowel syndrome)   . Interstitial  cystitis   . Migraines   . Normal cardiac stress test JULY 2009---  NORMAL  . 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 Spasm of sphincter of Oddi 1987  . Stroke ([place])   . Urethral stenosis S/P DILATIONS  . Urgency of urination     Family History  Problem Relation Age of Onset  . Colon cancer Maternal Grandmother   . Colon cancer Cousin   . Allergies Mother   . Asthma Mother   . Heart disease Mother   . Osteoarthritis Mother   . Diabetes Mother   . Irritable bowel syndrome Mother   . Brain cancer Father   . Osteoarthritis Father   . Colon polyps Father        adenomatous  . Heart disease Father   . Melanoma Sister   . Osteoarthritis Sister   . Irritable bowel syndrome Sister   . Diabetes Sister   . Heart disease Sister   . Stroke Sister   . Ulcerative colitis Maternal Uncle   . Stomach cancer Neg Hx   . Rheumatologic disease Neg Hx     Past Surgical History:  Procedure Laterality Date  . ABDOMINAL HYSTERECTOMY  1988  . APPENDECTOMY  04/ 1986  . bilateral reduction mastopexy  1988  . bilateral turbinate resection  1990   nasal spreading graft  . CATARACT EXTRACTION, BILATERAL    . CHOLECYSTECTOMY  04/ 1986  . COLONOSCOPY  02/2004, 11/2010   2005: Normal - Dr. Sammuel Cooper 2012: Normal  . CYSTO WITH HYDRODISTENSION N/A 12/17/2013   Procedure: CYSTO HYDRODISTENSION WITH INSTALLATION OF CHLOROPACTIN AND TETRACAINE;  Surgeon: Malka So, MD;  Location: [facility];  Service: Urology;  Laterality: N/A;  . CYSTO WITH HYDRODISTENSION N/A 06/02/2015   Procedure: CYSTOSCOPY/HYDRODISTENSION WITH INSTILLATION OF CLORPACTIN ;  Surgeon: [name], MD;  Location: [facility];  Service: Urology;  Laterality: N/A;  . CYSTOSCOPY WITH URETHRAL DILATATION  07/12/2011   Procedure: CYSTOSCOPY WITH URETHRAL DILATATION;  Surgeon: Malka So, MD;  Location: [facility];  Service:  Urology;  Laterality: N/A;  HOD AND INSTILLATION OF CHLOROPACTIN C-ARM   . CYSTOSCOPY WITH URETHRAL DILATATION N/A 06/26/2012   Procedure: CYSTOSCOPY WITH URETHRAL DILATATION HYDRODISTENSION AND INSTILLATION OF CLORPACTION ;  Surgeon: Malka So, MD;  Location: [facility];  Service: Urology;  Laterality: N/A;  CYSTOSCOPY WITH URETHRAL DILATATION HYDRODISTENSION AND INSTILLATION OF CLORPACTION   . CYSTOSTOMY W/ BLADDER BIOPSY  1983  . DILATION AND CURETTAGE OF UTERUS  1987  . ERCP  1988   for pancreatitis with stents   S/P SEVERE PANCREATITIS  . HEMORRHOID SURGERY    . LASIK Bilateral APRIL 2005  . MULTIPLE CYSTO/ HOD/ URETHRAL DILATION/ INSTILLATION CLORPACTIN  .LAST ONE 03-31-2010  . [place]  sinus cyst  . REDUCTION MAMMAPLASTY Bilateral 1988  . revision rhinoplasty  1978  . RHINOPLASTY  1977  . RIGHT SALPINGOOPHECTOMY  1998  . TONSILLECTOMY AND ADENOIDECTOMY  1963  . VIDEO BRONCHOSCOPY Bilateral 08/23/2016   Procedure: VIDEO BRONCHOSCOPY WITHOUT FLUORO;  Surgeon: Javier Glazier, MD;  Location: [facility];  Service: Cardiopulmonary;  Laterality: Bilateral;   Social History   Occupational History  . RN    Social History Main Topics  . Smoking status: Passive Smoke Exposure - Never Smoker  . Smokeless tobacco: Never Used     Comment: Father smoked briefly  . Alcohol use No  . Drug use: No  . Sexual activity: Not on file     Garald Balding, MD   Note - This record has been created using [employer].  Chart creation errors have been sought, but may not always  have been located. Such creation errors do not reflect on  the standard of medical care.

## 2016-09-27 ENCOUNTER — Telehealth (INDEPENDENT_AMBULATORY_CARE_PROVIDER_SITE_OTHER): Payer: Self-pay | Admitting: Orthopaedic Surgery

## 2016-09-28 NOTE — Progress Notes (Signed)
Left message for patient to contact office for results.

## 2016-10-01 ENCOUNTER — Other Ambulatory Visit: Payer: Self-pay | Admitting: Pulmonary Disease

## 2016-10-01 DIAGNOSIS — R799 Abnormal finding of blood chemistry, unspecified: Secondary | ICD-10-CM

## 2016-10-01 NOTE — Progress Notes (Signed)
Spoke with patient and informed her of results and Rheumatology referral. She verbalized understanding and did not have questions. Order placed for rheumatology. Nothing further is needed.

## 2016-10-04 ENCOUNTER — Ambulatory Visit
Admission: RE | Admit: 2016-10-04 | Discharge: 2016-10-04 | Disposition: A | Discharge status: Home / Self Care | Payer: PRIVATE HEALTH INSURANCE | Source: Ambulatory Visit | Attending: Orthopaedic Surgery | Admitting: Orthopaedic Surgery

## 2016-10-04 DIAGNOSIS — G8929 Other chronic pain: Secondary | ICD-10-CM

## 2016-10-04 DIAGNOSIS — M25512 Pain in left shoulder: Principal | ICD-10-CM

## 2016-10-06 LAB — ACID FAST CULTURE WITH REFLEXED SENSITIVITIES (MYCOBACTERIA): Acid Fast Culture: NEGATIVE

## 2016-10-08 ENCOUNTER — Ambulatory Visit (INDEPENDENT_AMBULATORY_CARE_PROVIDER_SITE_OTHER): Payer: PRIVATE HEALTH INSURANCE | Admitting: Orthopaedic Surgery

## 2016-10-08 ENCOUNTER — Encounter (INDEPENDENT_AMBULATORY_CARE_PROVIDER_SITE_OTHER): Payer: Self-pay | Admitting: Orthopaedic Surgery

## 2016-10-08 VITALS — Resp 14 | Ht 63.0 in | Wt 155.0 lb

## 2016-10-08 DIAGNOSIS — G8929 Other chronic pain: Secondary | ICD-10-CM | POA: Diagnosis not present

## 2016-10-08 DIAGNOSIS — M25512 Pain in left shoulder: Secondary | ICD-10-CM

## 2016-10-08 NOTE — Progress Notes (Signed)
Office Visit Note   Patient: Brenda [name]           Date of Birth: [birth year]           MRN: [id number] Visit Date: 10/08/2016              Requested by: Prince Solian, MD [address] [place], [place] [address] PCP: Prince Solian, MD   Assessment & Plan: Visit Diagnoses:  1. Chronic left shoulder pain   MRI scan with mild tendinosis of supraspinatus tendon. Considerable degenerative arthritis at the acromioclavicular joint with edema on both sides. I believe this is the most symptomatic Area Plan: On discussion regarding MRI scan findings. Had cortisone injection at the acromioclavicular joint several weeks ago was some mild relief. Dashauna also aware that surgery is an option  Follow-Up Instructions: Return if symptoms worsen or fail to improve.   Orders:  No orders of the defined types were placed in this encounter.  No orders of the defined types were placed in this encounter.     Procedures: No procedures performed   Clinical Data: No additional findings.   Subjective: Chief Complaint  Patient presents with  . Left Shoulder - Results    MRI results of Left shoulder  Chronic left shoulder pain as previously identified. Brenda [name] has had trouble off and on for several years with an exacerbation and December. She's had some relief with the cortisone injection. Localizes the pain along the anterior aspect of the shoulder with prior crossarm positive test and impingement testing  HPI  Review of Systems   Objective: Vital Signs: Resp 14   Ht 5\' 3"  (1.6 m)   Wt 155 lb (70.3 kg)   BMI 27.46 kg/m   Physical Exam  Ortho Exam pain directly over the acromioclavicular joint left shoulder. Positive crossarm test. Minimally positive impingement testing. Negative empty can testing. Good grip and release. No loss of overhead motion. Neurovascular exam intact  Specialty Comments:  No specialty comments available.  Imaging: No results found.   PMFS  History: Patient Active Problem List   Diagnosis Date Noted  . Persistent cough   . Moderate persistent asthma 03/19/2016  . Chronic seasonal allergic rhinitis 03/19/2016  . GERD (gastroesophageal reflux disease) 03/19/2016  . Cough 03/19/2016  . Essential hypertension, benign 03/19/2016  . H/O: CVA (cerebrovascular accident) 03/19/2016  . IBS (irritable bowel syndrome) 03/19/2016  . Interstitial cystitis 03/19/2016  . Fibromyalgia 03/19/2016  . DJD (degenerative joint disease) 03/19/2016  . Arthritis 03/19/2016  . Sphincter of Oddi dysfunction 03/19/2016  . H/O acute pancreatitis 03/19/2016   Past Medical History:  Diagnosis Date  . Allergic rhinitis   . Allergy   . Anal fissure   . Anemia   . Anxiety   . Arthritis   . Asthma   . Bladder pain   . Complication of anesthesia PAIN ATTACKS   LACTATED RINGERS- CONTRAINDICATED  . Depression   . Fibromyalgia   . Frequency of urination   . GERD (gastroesophageal reflux disease)   . Heart murmur   . History of pancreatitis 1988-1989  . Hyperlipidemia   . Hypertension LABILE  . IBS (irritable bowel syndrome)   . Interstitial cystitis   . Migraines   . Normal cardiac stress test JULY 2009---  NORMAL  . 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 Spasm of sphincter of Oddi 1987  .  Stroke ([place])   . Urethral stenosis S/P DILATIONS  . Urgency of urination     Family History  Problem Relation Age of Onset  . Colon cancer Maternal Grandmother   . Colon cancer Cousin   . Allergies Mother   . Asthma Mother   . Heart disease Mother   . Osteoarthritis Mother   . Diabetes Mother   . Irritable bowel syndrome Mother   . Brain cancer Father   . Osteoarthritis Father   . Colon polyps Father        adenomatous  . Heart disease Father   . Melanoma Sister   . Osteoarthritis Sister   . Irritable bowel syndrome Sister   . Diabetes Sister   . Heart disease  Sister   . Stroke Sister   . Ulcerative colitis Maternal Uncle   . Stomach cancer Neg Hx   . Rheumatologic disease Neg Hx     Past Surgical History:  Procedure Laterality Date  . ABDOMINAL HYSTERECTOMY  1988  . APPENDECTOMY  04/ 1986  . bilateral reduction mastopexy  1988  . bilateral turbinate resection  1990   nasal spreading graft  . CATARACT EXTRACTION, BILATERAL    . CHOLECYSTECTOMY  04/ 1986  . COLONOSCOPY  02/2004, 11/2010   2005: Normal - Dr. Sammuel Cooper 2012: Normal  . CYSTO WITH HYDRODISTENSION N/A 12/17/2013   Procedure: CYSTO HYDRODISTENSION WITH INSTALLATION OF CHLOROPACTIN AND TETRACAINE;  Surgeon: Malka So, MD;  Location: [facility];  Service: Urology;  Laterality: N/A;  . CYSTO WITH HYDRODISTENSION N/A 06/02/2015   Procedure: CYSTOSCOPY/HYDRODISTENSION WITH INSTILLATION OF CLORPACTIN ;  Surgeon: [name], MD;  Location: [facility];  Service: Urology;  Laterality: N/A;  . CYSTOSCOPY WITH URETHRAL DILATATION  07/12/2011   Procedure: CYSTOSCOPY WITH URETHRAL DILATATION;  Surgeon: Malka So, MD;  Location: [facility];  Service: Urology;  Laterality: N/A;  HOD AND INSTILLATION OF CHLOROPACTIN C-ARM   . CYSTOSCOPY WITH URETHRAL DILATATION N/A 06/26/2012   Procedure: CYSTOSCOPY WITH URETHRAL DILATATION HYDRODISTENSION AND INSTILLATION OF CLORPACTION ;  Surgeon: Malka So, MD;  Location: [facility];  Service: Urology;  Laterality: N/A;  CYSTOSCOPY WITH URETHRAL DILATATION HYDRODISTENSION AND INSTILLATION OF CLORPACTION   . CYSTOSTOMY W/ BLADDER BIOPSY  1983  . DILATION AND CURETTAGE OF UTERUS  1987  . ERCP  1988   for pancreatitis with stents   S/P SEVERE PANCREATITIS  . HEMORRHOID SURGERY    . LASIK Bilateral APRIL 2005  . MULTIPLE CYSTO/ HOD/ URETHRAL DILATION/ INSTILLATION CLORPACTIN  .LAST ONE 03-31-2010  . NASAL SINUS SURGERY  1977   sinus cyst  . REDUCTION MAMMAPLASTY Bilateral 1988  . revision  rhinoplasty  1978  . RHINOPLASTY  1977  . RIGHT SALPINGOOPHECTOMY  1998  . TONSILLECTOMY AND ADENOIDECTOMY  1963  . VIDEO BRONCHOSCOPY Bilateral 08/23/2016   Procedure: VIDEO BRONCHOSCOPY WITHOUT FLUORO;  Surgeon: Javier Glazier, MD;  Location: [facility];  Service: Cardiopulmonary;  Laterality: Bilateral;   Social History   Occupational History  . RN    Social History Main Topics  . Smoking status: Passive Smoke Exposure - Never Smoker  . Smokeless tobacco: Never Used     Comment: Father smoked briefly  . Alcohol use No  . Drug use: No  . Sexual activity: Not on file     Garald Balding, MD   Note - This record has been created using [employer].  Chart creation errors have been  sought, but may not always  have been located. Such creation errors do not reflect on  the standard of medical care.

## 2016-10-12 LAB — FUNGUS CULTURE W SMEAR

## 2016-10-19 LAB — ACID FAST CULTURE WITH REFLEXED SENSITIVITIES (MYCOBACTERIA): Acid Fast Culture: NEGATIVE

## 2016-10-22 NOTE — Telephone Encounter (Signed)
Opened by mistake.

## 2016-10-29 ENCOUNTER — Encounter (HOSPITAL_BASED_OUTPATIENT_CLINIC_OR_DEPARTMENT_OTHER): Payer: Self-pay | Admitting: *Deleted

## 2016-10-29 LAB — AFB CULTURE WITH SMEAR (NOT AT ARMC)

## 2016-10-29 NOTE — Progress Notes (Signed)
NPO AFTER MN.  ARRIVE AT 0600.  NEEDS ISTAT AND EKG.  WILL TAKE AM MEDS W/ SIPS OF WATER DOS.

## 2016-10-30 ENCOUNTER — Encounter (INDEPENDENT_AMBULATORY_CARE_PROVIDER_SITE_OTHER): Payer: Self-pay | Admitting: Orthopaedic Surgery

## 2016-10-30 ENCOUNTER — Ambulatory Visit (INDEPENDENT_AMBULATORY_CARE_PROVIDER_SITE_OTHER): Payer: PRIVATE HEALTH INSURANCE | Admitting: Orthopaedic Surgery

## 2016-10-30 VITALS — BP 130/80 | HR 72 | Resp 16 | Ht 60.0 in | Wt 137.0 lb

## 2016-10-30 DIAGNOSIS — G8929 Other chronic pain: Secondary | ICD-10-CM

## 2016-10-30 DIAGNOSIS — M25512 Pain in left shoulder: Secondary | ICD-10-CM

## 2016-10-30 MED ORDER — BUPIVACAINE HCL 0.5 % IJ SOLN
2.0000 mL | INTRAMUSCULAR | Status: AC | PRN
  Administered 2016-10-30: 2 mL via INTRA_ARTICULAR

## 2016-10-30 MED ORDER — METHYLPREDNISOLONE ACETATE 40 MG/ML IJ SUSP
80.0000 mg | INTRAMUSCULAR | Status: AC | PRN
  Administered 2016-10-30: 80 mg

## 2016-10-30 MED ORDER — LIDOCAINE HCL 1 % IJ SOLN
2.0000 mL | INTRAMUSCULAR | Status: AC | PRN
  Administered 2016-10-30: 2 mL

## 2016-10-30 NOTE — Progress Notes (Signed)
Office Visit Note   Patient: Brenda [name]           Date of Birth: [birth year]           MRN: [id number] Visit Date: 10/30/2016              Requested by: Prince Solian, MD [address] [place], [place] [address] PCP: Prince Solian, MD   Assessment & Plan: Visit Diagnoses:  1. Chronic left shoulder pain   Impingement syndrome left shoulder with degenerative arthrosis acromioclavicular joint Also having persistent pain in her left knee which has been previously evaluated we'll see patient in one month to evaluate both Plan: Subacromial and acromioclavicular joint injection left shoulder  Follow-Up Instructions: Return in about 1 month (around 11/30/2016).   Orders:  No orders of the defined types were placed in this encounter.  No orders of the defined types were placed in this encounter.     Procedures: Large Joint Inj Date/Time: 10/30/2016 5:47 PM Performed by: Garald Balding Authorized by: Garald Balding   Consent Given by:  Patient Timeout: prior to procedure the correct patient, procedure, and site was verified   Indications:  Pain Location:  Shoulder Site:  L subacromial bursa Prep: patient was prepped and draped in usual sterile fashion   Needle Size:  25 G Needle Length:  1.5 inches Approach:  Lateral Ultrasound Guidance: No   Fluoroscopic Guidance: No   Arthrogram: No   Medications:  80 mg methylPREDNISolone acetate 40 MG/ML; 2 mL lidocaine 1 %; 2 mL bupivacaine 0.5 % Aspiration Attempted: No   Patient tolerance:  Patient tolerated the procedure well with no immediate complications     Clinical Data: No additional findings.   Subjective: Chief Complaint  Patient presents with  . Left Shoulder - Pain  . Shoulder Pain    Left shoulder pain x 03/2016, no injury, limited range of motion, difficulty sleeping, Wants inj after MRI results, no shoulder surgery, not diabetic, Tylenol doesn't help  (Experiencing recurrent pain to the point of  compromise of left shoulder. She does have degenerative arthrosis at the acromioclavicular joint with edema on both sides. She 6 parents in pain at that one level and wishes to have another cortisone injection   HPI  Review of Systems   Objective: Vital Signs: BP 130/80 (BP Location: Right Arm, Patient Position: Sitting, Cuff Size: Normal)   Pulse 72   Resp 16   Ht 5' (1.524 m)   Wt 137 lb (62.1 kg)   BMI 26.76 kg/m   Physical Exam  Ortho Exam positive impingement left shoulder with pain directly over the acromioclavicular joint. Minimal swelling. No redness. Positive impingement. Good strength   Specialty Comments:  No specialty comments available.  Imaging: No results found.   PMFS History: Patient Active Problem List   Diagnosis Date Noted  . Persistent cough   . Moderate persistent asthma 03/19/2016  . Chronic seasonal allergic rhinitis 03/19/2016  . GERD (gastroesophageal reflux disease) 03/19/2016  . Cough 03/19/2016  . Essential hypertension, benign 03/19/2016  . H/O: CVA (cerebrovascular accident) 03/19/2016  . IBS (irritable bowel syndrome) 03/19/2016  . Interstitial cystitis 03/19/2016  . Fibromyalgia 03/19/2016  . DJD (degenerative joint disease) 03/19/2016  . Arthritis 03/19/2016  . Sphincter of Oddi dysfunction 03/19/2016  . H/O acute pancreatitis 03/19/2016   Past Medical History:  Diagnosis Date  . Anal fissure   . Anemia   . Anxiety   . Arthritis   . Bladder pain   .  Chronic seasonal allergic rhinitis   . Complication of anesthesia PAIN ATTACKS   LACTATED RINGERS- CONTRAINDICATED  . Depression   . Fibromyalgia   . Frequency of urination   . GERD (gastroesophageal reflux disease)   . Heart murmur   . History of pancreatitis 1988-1989  . Hyperlipidemia   . Hypertension LABILE  . IBS (irritable bowel syndrome)   . Interstitial cystitis   . Migraines   . 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 Multiple  pulmonary nodules   . Normal cardiac stress test JULY 2009---  NORMAL  . 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 Spasm of sphincter of Oddi 1987  . Urethral stenosis S/P DILATIONS  . Urgency of urination     Family History  Problem Relation Age of Onset  . Colon cancer Maternal Grandmother   . Colon cancer Cousin   . Allergies Mother   . Asthma Mother   . Heart disease Mother   . Osteoarthritis Mother   . Diabetes Mother   . Irritable bowel syndrome Mother   . Brain cancer Father   . Osteoarthritis Father   . Colon polyps Father        adenomatous  . Heart disease Father   . Melanoma Sister   . Osteoarthritis Sister   . Irritable bowel syndrome Sister   . Diabetes Sister   . Heart disease Sister   . Stroke Sister   . Ulcerative colitis Maternal Uncle   . Stomach cancer Neg Hx   . Rheumatologic disease Neg Hx     Past Surgical History:  Procedure Laterality Date  . ABDOMINAL HYSTERECTOMY  1988  . APPENDECTOMY  04/ 1986  . bilateral reduction mastopexy  1988  . bilateral turbinate resection  1990   nasal spreading graft  . CATARACT EXTRACTION W/ INTRAOCULAR LENS  IMPLANT, BILATERAL  08 and 09/ 2017  . CHOLECYSTECTOMY  04/ 1986  . COLONOSCOPY  02/2004, 11/2010   2005: Normal - Dr. Sammuel Cooper 2012: Normal  . CYSTO WITH HYDRODISTENSION N/A 12/17/2013   Procedure: CYSTO HYDRODISTENSION WITH INSTALLATION OF CHLOROPACTIN AND TETRACAINE;  Surgeon: Malka So, MD;  Location: [facility];  Service: Urology;  Laterality: N/A;  . CYSTO WITH HYDRODISTENSION N/A 06/02/2015   Procedure: CYSTOSCOPY/HYDRODISTENSION WITH INSTILLATION OF CLORPACTIN ;  Surgeon: [name], MD;  Location[facility];  Service: Urology;  Laterality: N/A;  . CYSTOSCOPY WITH URETHRAL DILATATION  07/12/2011   Procedure: CYSTOSCOPY WITH URETHRAL DILATATION;  Surgeon: Malka So, MD;  Location: [facility];  Service: Urology;  Laterality: N/A;  HOD AND INSTILLATION OF CHLOROPACTIN C-ARM   . CYSTOSCOPY WITH URETHRAL DILATATION N/A 06/26/2012   Procedure: CYSTOSCOPY WITH URETHRAL DILATATION HYDRODISTENSION AND INSTILLATION OF CLORPACTION ;  Surgeon: Malka So, MD;  Location: [facility];  Service: Urology;  Laterality: N/A;  CYSTOSCOPY WITH URETHRAL DILATATION HYDRODISTENSION AND INSTILLATION OF CLORPACTION   . CYSTOSTOMY W/ BLADDER BIOPSY  1983  . DILATION AND CURETTAGE OF UTERUS  1987  . ERCP  1988   for pancreatitis with stents   S/P SEVERE PANCREATITIS  . HEMORRHOID SURGERY    . LASIK Bilateral APRIL 2005  . MULTIPLE CYSTO/ HOD/ URETHRAL DILATION/ INSTILLATION CLORPACTIN  .LAST ONE 03-31-2010  . NASAL SINUS SURGERY  1977   sinus cyst  .  REDUCTION MAMMAPLASTY Bilateral 1988  . revision rhinoplasty  1978  . RHINOPLASTY  1977  . RIGHT SALPINGOOPHECTOMY  1998  . TONSILLECTOMY AND ADENOIDECTOMY  1963  . VIDEO BRONCHOSCOPY Bilateral 08/23/2016   Procedure: VIDEO BRONCHOSCOPY WITHOUT FLUORO;  Surgeon: Javier Glazier, MD;  Location: [facility];  Service: Cardiopulmonary;  Laterality: Bilateral;   Social History   Occupational History  . RN    Social History Main Topics  . Smoking status: Passive Smoke Exposure - Never Smoker  . Smokeless tobacco: Never Used     Comment: Father smoked briefly  . Alcohol use No  . Drug use: No  . Sexual activity: Not on file     Garald Balding, MD   Note - This record has been created using [employer].  Chart creation errors have been sought, but may not always  have been located. Such creation errors do not reflect on  the standard of medical care.

## 2016-11-07 NOTE — H&P (Signed)
CC/HPI: I have interstitial cystitis.     Brenda [name] has a long history of IC and needs a repeat HOD and clorpactin. She has bladder pain and frequency.    ALLERGIES: Codeine Derivatives - elevated liver enzymes Dilaudid TABS - Itching Lisinopril TABS - cough Marcaine SOLN - tachycardia and presyncope Morphine Derivatives - Itching, Hives Stadol SOLN - Swelling, tongue swelling and lips numb   MEDICATIONS: Aspirin 81 mg tablet, chewable 1 tablet PO Daily  Metoprolol Tartrate 50 mg tablet 1 tablet PO Daily  Benicar 20 mg tablet 1 tablet PO Daily  Calcium Plus Vitamin D 1 PO Daily  Estradiol 0.1 mg/24 hour patch, transdermal weekly 1 PO Daily  Fish Oil 1 PO Daily  Folic Acid 1 mg tablet 1 tablet PO Daily  Lorazepam 1 mg tablet 1 tablet PO Daily  Probiotic 1 PO Daily  Sertraline Hcl 25 mg tablet  Singulair 10 mg tablet  Ventolin Hfa 1 PO Daily  Vitamin C 1 PO Daily  Vitamin D2 50,000 unit capsule 1 capsule PO Daily  Zetia 10 mg tablet 1 tablet PO Daily    GU PSH: Cysto Bladder Ureth Biopsy - 2012 Cysto Dilate Stricture (M or F) - 2013 Cystoscopy Hydrodistention - 2013, 2011, 2010, 2010, 2008 D&C Non-OB - 2012 Hysterectomy Unilat SO - 2012 Ovary Removal Partial or Total - 2012     PSH Notes: Hemorrhoidectomy, Cystoscopy For Urethral Stricture, Bladder Irrigation, Cystoscopy With Dilation Of Bladder, Complete Colonoscopy, Oophorectomy, Corneal LASIK Bilateral, Liver Biopsy, Hysterectomy, Breast Surgery Reduction Procedure, Endoscopic Retrograde Cholangiopancreatography (ERCP), Dilation And Curettage, Breast Surgery Mastopexy Bilateral, Tonsillectomy With Adenoidectomy, Cholecystectomy, Appendectomy, Cystoscopy With Biopsy, Nose Surgery, Nasal Septal Deviation Repair, Bladder Irrigation, Cystoscopy With Dilation Of Bladder, Bladder Irrigation, Cystoscopy With Dilation Of Bladder, Cystoscopy With Dilation Of Bladder, Bladder Irrigation, Cystoscopy With Dilation Of Bladder  NON-GU PSH:  Appendectomy - 2012 Breast Reduction - 2012 Cataract surgery, Bilateral Cholecystectomy (open) - 2012 Diagnostic Colonoscopy - 2012 Endo Cholangiopancreatograph - 2012 Hemorrhoidectomy (favorite) - 2015 Needle Biopsy Liver - 2012 Remove Tonsils And Adenoids - 2012 Suspend Breast - 2012        GU PMH: Interstitial Cystitis (w/o hematuria), Chronic interstitial cystitis without hematuria - 06/01/2015 Cystocele, midline, Cystocele, midline - 2014 Endometriosis, Unspec, Endometriosis - 2014 Stress Incontinence, Female stress incontinence - 2014 Urethral Stricture, Unspec, Urethral stricture - 2014      PMH Notes:  2010-05-29 17:21:52 - Note: Spasm Of Sphincter Of Oddi  2010-05-29 17:24:06 - Note: Serum Enzyme Levels - ALT (SGPT) Elevated   NON-GU PMH: Cervicalgia, Neck pain, bilateral - 09/06/2015 Radiculopathy, cervical region, Cervical radiculopathy - 09/06/2015 Muscle weakness (generalized), Muscle weakness - 09/02/2015 Other muscle spasm, Muscle spasm - 09/02/2015 Paresthesia of skin, Paresthesia and pain of both upper extremities - 7/0/2637 Follicular disorder, unspecified, Folliculitis - 12/02/8848 Abrasion of unspecified hand, initial encounter, Abrasion of hand - 2015 Acute upper respiratory infection, unspecified, Upper respiratory infection with cough and congestion - 2015 Personal history of other diseases of the respiratory system, History of acute bronchitis - 2015 Vitamin D deficiency, unspecified, Vitamin D deficiency - 2015 Encounter for general adult medical examination without abnormal findings, Encounter for preventive health examination - 2015 Anxiety, Anxiety (Symptom) - 2014 Asthma, Asthma - 2014 Cerebral infarction, unspecified, Stroke Syndrome - 2014 Fibromyalgia, Fibromyalgia - 2014 Irritable bowel syndrome with diarrhea, Irritable Bowel Syndrome - 2014 Personal history of other diseases of the circulatory system, History of hypertension - 2014 Personal history of  other diseases of the nervous  system and sense organs, History of migraine headaches - 2014 Personal history of other endocrine, nutritional and metabolic disease, History of hypercholesterolemia - 2014 Personal history of other specified conditions, History of fibrocystic disease of breast - 2014 Rash and other nonspecific skin eruption, Rash - 2014    FAMILY HISTORY: Acute Myocardial Infarction - Mother Bladder Cancer - Father colonic polyps - Father Congestive Heart Failure - Mother Coronary Artery Disease - Mother Death In The Family Father - Runs In Family Death In The Family Mother - Runs In Family Diabetes - Sister, Mother Family Health Status Number - Runs In Family fibromyalgia - Mother Glioblastoma Multiforme (Grade IV) Of The Brain - Father Heart Disease - Mother Malignant Melanoma Of The Skin - Sister Pure Hypercholesterolemia - Father skin cancer - Mother, Father Stroke Syndrome - Mother Ulcer, Gastric - Mother   SOCIAL HISTORY: Marital Status: Married Current Smoking Status: Patient has never smoked.  Has never drank.  Does not use drugs. Drinks 1 caffeinated drink per day. 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     Notes: Exercise habits, Activities of daily living (ADL's), requires assistance, Occupation:, Never A Smoker, Caffeine Use, Tobacco Use, Alcohol Use, Marital History - Currently Married, no children   REVIEW OF SYSTEMS:     GU Review Female:  Patient reports frequent urination and get up at night to urinate. Patient denies hard to postpone urination, burning /pain with urination, leakage of urine, stream starts and stops, trouble starting your stream, have to strain to urinate, and being pregnant.    Gastrointestinal (Upper):  Patient denies nausea, vomiting, and indigestion/ heartburn.    Gastrointestinal (Lower):  Patient denies diarrhea and constipation.    Constitutional:  Patient reports fatigue. Patient denies fever, night sweats, and  weight loss.    Skin:  Patient denies skin rash/ lesion and itching.    Eyes:  Patient denies blurred vision and double vision.    Ears/ Nose/ Throat:  Patient denies sore throat and sinus problems.    Hematologic/Lymphatic:  Patient denies swollen glands and easy bruising.    Cardiovascular:  Patient denies leg swelling and chest pains.    Respiratory:  Patient denies cough and shortness of breath.    Endocrine:  Patient denies excessive thirst.    Musculoskeletal:  Patient reports joint pain. Patient denies back pain.    Neurological:  Patient denies headaches and dizziness.    Psychologic:  Patient denies depression and anxiety.    VITAL SIGNS:       09/19/2016 03:59 PM     Weight 137 lb / 62.14 kg     Height 59.5 in / 151.13 cm     BP 114/71 mmHg     Pulse 72 /min     Temperature 97.0 F / 36 C     BMI 27.2 kg/m     MULTI-SYSTEM PHYSICAL EXAMINATION:      Constitutional: Well-nourished. No physical deformities. Normally developed. Good grooming.     Respiratory: No labored breathing, no use of accessory muscles. CTA     Cardiovascular: Normal temperature, RRR without murmur.             PAST DATA REVIEWED:   Source Of History:  Patient   PROCEDURES:    Urinalysis - 81003  Dipstick Dipstick Cont'd  Specimen: Patient Cath Bilirubin: Neg  Color: Yellow Ketones: Neg  Appearance: Clear Blood: Neg  Specific Gravity: 1.015 Protein: Neg  pH: <=5.0 Urobilinogen: 0.2  Glucose: Neg Nitrites:  Neg   Leukocyte Esterase: Neg   Notes:       ASSESSMENT:     ICD-10 Details  1 GU:  Interstitial Cystitis (w/o hematuria) - N30.10 I will get her set up for a repeat HOD and clorpactin. She is well aware of the risks.    PLAN:   Schedule  Return Visit/Planned Activity: Next Available Appointment - Schedule Surgery

## 2016-11-08 ENCOUNTER — Ambulatory Visit (HOSPITAL_BASED_OUTPATIENT_CLINIC_OR_DEPARTMENT_OTHER): Payer: PRIVATE HEALTH INSURANCE | Admitting: Anesthesiology

## 2016-11-08 ENCOUNTER — Encounter (HOSPITAL_BASED_OUTPATIENT_CLINIC_OR_DEPARTMENT_OTHER)
Admission: RE | Disposition: A | Discharge status: Home / Self Care | Payer: Self-pay | Source: Ambulatory Visit | Attending: Urology

## 2016-11-08 ENCOUNTER — Ambulatory Visit (HOSPITAL_BASED_OUTPATIENT_CLINIC_OR_DEPARTMENT_OTHER)
Admission: RE | Admit: 2016-11-08 | Discharge: 2016-11-08 | Disposition: A | Discharge status: Home / Self Care | Payer: PRIVATE HEALTH INSURANCE | Source: Ambulatory Visit | Attending: Urology | Admitting: Urology

## 2016-11-08 ENCOUNTER — Encounter (HOSPITAL_BASED_OUTPATIENT_CLINIC_OR_DEPARTMENT_OTHER): Payer: Self-pay | Admitting: *Deleted

## 2016-11-08 DIAGNOSIS — Z79899 Other long term (current) drug therapy: Secondary | ICD-10-CM | POA: Insufficient documentation

## 2016-11-08 DIAGNOSIS — Z7982 Long term (current) use of aspirin: Secondary | ICD-10-CM | POA: Diagnosis not present

## 2016-11-08 DIAGNOSIS — N3010 Interstitial cystitis (chronic) without hematuria: Secondary | ICD-10-CM | POA: Insufficient documentation

## 2016-11-08 HISTORY — DX: Other seasonal allergic rhinitis: J30.2

## 2016-11-08 HISTORY — DX: Moderate persistent asthma, uncomplicated: J45.40

## 2016-11-08 HISTORY — PX: CYSTO WITH HYDRODISTENSION: SHX5453

## 2016-11-08 HISTORY — DX: Other nonspecific abnormal finding of lung field: R91.8

## 2016-11-08 LAB — POCT I-STAT 4, (NA,K, GLUC, HGB,HCT)
Glucose, Bld: 103 mg/dL — ABNORMAL HIGH (ref 65–99)
HCT: 40 % (ref 36.0–46.0)
Hemoglobin: 13.6 g/dL (ref 12.0–15.0)
Potassium: 4.3 mmol/L (ref 3.5–5.1)
Sodium: 140 mmol/L (ref 135–145)

## 2016-11-08 SURGERY — CYSTOSCOPY, WITH BLADDER HYDRODISTENSION
Anesthesia: Monitor Anesthesia Care | Site: Bladder

## 2016-11-08 MED ORDER — SODIUM CHLORIDE 0.9% FLUSH
3.0000 mL | Freq: Two times a day (BID) | INTRAVENOUS | Status: DC
  Filled 2016-11-08: qty 3

## 2016-11-08 MED ORDER — FENTANYL CITRATE (PF) 100 MCG/2ML IJ SOLN
25.0000 ug | INTRAMUSCULAR | Status: DC | PRN
  Filled 2016-11-08: qty 1

## 2016-11-08 MED ORDER — PROPOFOL 500 MG/50ML IV EMUL
INTRAVENOUS | Status: DC | PRN
  Administered 2016-11-08: 200 ug/kg/min via INTRAVENOUS

## 2016-11-08 MED ORDER — SODIUM CHLORIDE 0.9 % IV SOLN
INTRAVENOUS | Status: DC
  Administered 2016-11-08: 07:00:00 via INTRAVENOUS
  Filled 2016-11-08: qty 1000

## 2016-11-08 MED ORDER — SODIUM CHLORIDE 0.9% FLUSH
3.0000 mL | INTRAVENOUS | Status: DC | PRN
  Filled 2016-11-08: qty 3

## 2016-11-08 MED ORDER — PROMETHAZINE HCL 25 MG/ML IJ SOLN
INTRAMUSCULAR | Status: AC
  Filled 2016-11-08: qty 1

## 2016-11-08 MED ORDER — LIDOCAINE 2% (20 MG/ML) 5 ML SYRINGE
INTRAMUSCULAR | Status: AC
  Filled 2016-11-08: qty 5

## 2016-11-08 MED ORDER — DEXAMETHASONE SODIUM PHOSPHATE 10 MG/ML IJ SOLN
INTRAMUSCULAR | Status: AC
  Filled 2016-11-08: qty 1

## 2016-11-08 MED ORDER — DEXAMETHASONE SODIUM PHOSPHATE 4 MG/ML IJ SOLN
INTRAMUSCULAR | Status: DC | PRN
  Administered 2016-11-08 (×2): 5 mg via INTRAVENOUS

## 2016-11-08 MED ORDER — ONDANSETRON HCL 4 MG/2ML IJ SOLN
INTRAMUSCULAR | Status: AC
  Filled 2016-11-08: qty 2

## 2016-11-08 MED ORDER — OXYCHLOROSENE SODIUM POWD
Status: DC | PRN
  Administered 2016-11-08: 1

## 2016-11-08 MED ORDER — PROMETHAZINE HCL 25 MG/ML IJ SOLN
25.0000 mg | Freq: Once | INTRAMUSCULAR | Status: AC
  Administered 2016-11-08: 25 mg via INTRAMUSCULAR
  Filled 2016-11-08: qty 1

## 2016-11-08 MED ORDER — CIPROFLOXACIN IN D5W 400 MG/200ML IV SOLN
400.0000 mg | INTRAVENOUS | Status: AC
  Administered 2016-11-08: 400 mg via INTRAVENOUS
  Filled 2016-11-08: qty 200

## 2016-11-08 MED ORDER — MIDAZOLAM HCL 5 MG/5ML IJ SOLN
INTRAMUSCULAR | Status: DC | PRN
  Administered 2016-11-08: 2 mg via INTRAVENOUS

## 2016-11-08 MED ORDER — FENTANYL CITRATE (PF) 100 MCG/2ML IJ SOLN
INTRAMUSCULAR | Status: AC
  Filled 2016-11-08: qty 2

## 2016-11-08 MED ORDER — ONDANSETRON HCL 4 MG/2ML IJ SOLN
INTRAMUSCULAR | Status: DC | PRN
  Administered 2016-11-08: 4 mg via INTRAVENOUS

## 2016-11-08 MED ORDER — LIDOCAINE 2% (20 MG/ML) 5 ML SYRINGE
INTRAMUSCULAR | Status: DC | PRN
  Administered 2016-11-08: 50 mg via INTRAVENOUS

## 2016-11-08 MED ORDER — FENTANYL CITRATE (PF) 100 MCG/2ML IJ SOLN
INTRAMUSCULAR | Status: DC | PRN
  Administered 2016-11-08: 50 ug via INTRAVENOUS

## 2016-11-08 MED ORDER — MEPERIDINE HCL 100 MG/ML IJ SOLN
75.0000 mg | Freq: Once | INTRAMUSCULAR | Status: AC
  Administered 2016-11-08: 75 mg via INTRAMUSCULAR
  Filled 2016-11-08 (×2): qty 1

## 2016-11-08 MED ORDER — STERILE WATER FOR IRRIGATION IR SOLN
Status: DC | PRN
  Administered 2016-11-08: 3000 mL

## 2016-11-08 MED ORDER — SODIUM CHLORIDE 0.9 % IV SOLN
250.0000 mL | INTRAVENOUS | Status: DC | PRN
  Filled 2016-11-08: qty 250

## 2016-11-08 MED ORDER — ACETAMINOPHEN 325 MG PO TABS
650.0000 mg | ORAL_TABLET | ORAL | Status: DC | PRN
  Filled 2016-11-08: qty 2

## 2016-11-08 MED ORDER — PROPOFOL 10 MG/ML IV BOLUS
INTRAVENOUS | Status: AC
  Filled 2016-11-08: qty 40

## 2016-11-08 MED ORDER — CIPROFLOXACIN IN D5W 400 MG/200ML IV SOLN
INTRAVENOUS | Status: AC
  Filled 2016-11-08: qty 200

## 2016-11-08 MED ORDER — ARTIFICIAL TEARS OPHTHALMIC OINT
TOPICAL_OINTMENT | OPHTHALMIC | Status: AC
  Filled 2016-11-08: qty 3.5

## 2016-11-08 MED ORDER — MIDAZOLAM HCL 2 MG/2ML IJ SOLN
INTRAMUSCULAR | Status: AC
  Filled 2016-11-08: qty 2

## 2016-11-08 MED ORDER — METOCLOPRAMIDE HCL 5 MG/ML IJ SOLN
10.0000 mg | Freq: Once | INTRAMUSCULAR | Status: DC | PRN
  Filled 2016-11-08: qty 2

## 2016-11-08 MED ORDER — ACETAMINOPHEN 650 MG RE SUPP
650.0000 mg | RECTAL | Status: DC | PRN
  Filled 2016-11-08: qty 1

## 2016-11-08 SURGICAL SUPPLY — 27 items
BAG DRAIN URO-CYSTO SKYTR STRL (DRAIN) ×2 IMPLANT
BAG DRN UROCATH (DRAIN) ×1
BAG URINE DRAINAGE (UROLOGICAL SUPPLIES) ×1 IMPLANT
CATH ROBINSON RED A/P 16FR (CATHETERS) ×2 IMPLANT
CATH SILICONE 16FRX5CC (CATHETERS) ×1 IMPLANT
CLOTH BEACON ORANGE TIMEOUT ST (SAFETY) ×2 IMPLANT
ELECT REM PT RETURN 9FT ADLT (ELECTROSURGICAL) ×2
ELECTRODE REM PT RTRN 9FT ADLT (ELECTROSURGICAL) ×1 IMPLANT
GLOVE BIOGEL PI IND STRL 6.5 (GLOVE) IMPLANT
GLOVE BIOGEL PI INDICATOR 6.5 (GLOVE) ×2
GLOVE SURG SS PI 6.5 STRL IVOR (GLOVE) ×1 IMPLANT
GLOVE SURG SS PI 8.0 STRL IVOR (GLOVE) ×2 IMPLANT
GOWN STRL REUS W/ TWL XL LVL3 (GOWN DISPOSABLE) ×1 IMPLANT
GOWN STRL REUS W/TWL LRG LVL3 (GOWN DISPOSABLE) ×1 IMPLANT
GOWN STRL REUS W/TWL XL LVL3 (GOWN DISPOSABLE) ×1 IMPLANT
HOLDER FOLEY CATH W/STRAP (MISCELLANEOUS) ×1 IMPLANT
KIT RM TURNOVER CYSTO AR (KITS) ×2 IMPLANT
MANIFOLD NEPTUNE II (INSTRUMENTS) ×1 IMPLANT
NDL SAFETY ECLIPSE 18X1.5 (NEEDLE) ×1 IMPLANT
NEEDLE HYPO 18GX1.5 SHARP (NEEDLE) ×2
NS IRRIG 500ML POUR BTL (IV SOLUTION) IMPLANT
PACK CYSTO (CUSTOM PROCEDURE TRAY) ×2 IMPLANT
PLUG CATH AND CAP STER (CATHETERS) ×1 IMPLANT
SYR 30ML LL (SYRINGE) ×2 IMPLANT
TUBE CONNECTING 12X1/4 (SUCTIONS) IMPLANT
WATER STERILE IRR 3000ML UROMA (IV SOLUTION) ×3 IMPLANT
WATER STERILE IRR 500ML POUR (IV SOLUTION) ×1 IMPLANT

## 2016-11-08 NOTE — Anesthesia Preprocedure Evaluation (Addendum)
Anesthesia Evaluation  Patient identified by MRN, date of birth, ID band Patient awake    Reviewed: Allergy & Precautions, NPO status , Patient's Chart, lab work & pertinent test results  History of Anesthesia Complications (+) history of anesthetic complications  Airway Mallampati: II   Neck ROM: full    Dental  (+) Teeth Intact, Dental Advisory Given, Caps, Implants,    Pulmonary asthma ,    breath sounds clear to auscultation       Cardiovascular hypertension, Pt. on medications + Valvular Problems/Murmurs  Rhythm:regular Rate:Normal     Neuro/Psych  Headaches, PSYCHIATRIC DISORDERS Anxiety Depression  Neuromuscular disease CVA, No Residual Symptoms    GI/Hepatic Neg liver ROS, GERD  ,IBS.   Endo/Other  negative endocrine ROS  Renal/GU negative Renal ROS     Musculoskeletal  (+) Arthritis , Fibromyalgia -  Abdominal   Peds  Hematology  (+) anemia ,   Anesthesia Other Findings   Reproductive/Obstetrics                           Anesthesia Physical  Anesthesia Plan  ASA: III  Anesthesia Plan: MAC   Post-op Pain Management:    Induction: Intravenous  PONV Risk Score and Plan:   Airway Management Planned: Simple Face Mask  Additional Equipment:   Intra-op Plan:   Post-operative Plan:   Informed Consent: I have reviewed the patients History and Physical, chart, labs and discussed the procedure including the risks, benefits and alternatives for the proposed anesthesia with the patient or authorized representative who has indicated his/her understanding and acceptance.   Dental advisory given  Plan Discussed with: CRNA, Anesthesiologist and Surgeon  Anesthesia Plan Comments:        Anesthesia Quick Evaluation

## 2016-11-08 NOTE — Anesthesia Postprocedure Evaluation (Signed)
Anesthesia Post Note  Patient: Brenda [name]  Procedure(s) Performed: Procedure(s) (LRB): CYSTOSCOPY/HYDRODISTENSION INSTILLATION OF CLORPACTIN (N/A)     Patient location during evaluation: PACU Anesthesia Type: MAC Level of consciousness: awake and alert Pain management: pain level controlled Vital Signs Assessment: post-procedure vital signs reviewed and stable Respiratory status: spontaneous breathing, nonlabored ventilation, respiratory function stable and patient connected to nasal cannula oxygen Cardiovascular status: stable and blood pressure returned to baseline Anesthetic complications: no    Last Vitals:  Vitals:   11/08/16 0830 11/08/16 0845  BP: 129/81 129/89  Pulse: 62 64  Resp: 15 14  Temp:      Last Pain:  Vitals:   11/08/16 0845  TempSrc:   PainSc: 2                  Montez Hageman

## 2016-11-08 NOTE — Op Note (Signed)
NAMEDECLYNN, LOPRESTI               ACCOUNT NO.:  [id number]  MEDICAL RECORD NO.:  [id number]  LOCATION:                               FACILITY:  [facility]  PHYSICIAN:  Marshall Cork. Jeffie Pollock, M.D.    DATE OF BIRTH:  May 23, 1950  DATE OF PROCEDURE:  11/08/2016 DATE OF DISCHARGE:                              OPERATIVE REPORT   PROCEDURE:  Cystoscopy with hydrodistention of the bladder, instillation of Clorpactin and tetracaine.  PREOPERATIVE DIAGNOSIS:  Interstitial cystitis.  POSTOPERATIVE DIAGNOSIS:  Interstitial cystitis.  SURGEON:  Marshall Cork. Jeffie Pollock, M.D.  ANESTHESIA:  General.  SPECIMEN:  None.  DRAINS:  A 16-French silastic Foley catheter.  BLOOD LOSS:  None.  COMPLICATIONS:  None.  INDICATIONS:  Brenda [name] is a 66 year old white female with a long history of interstitial cystitis, who requires intermittent hydrodistention of the bladder for symptom relief.  FINDINGS AND PROCEDURE:  She was taken to the operating room where she was given antibiotic.  A general anesthetic was induced.  She was placed in lithotomy position.  Her perineum and genitalia were prepped with Betadine solution and she was draped in usual sterile fashion.  Cystoscopy was performed using a 23-French scope and 30-degree lens. Examination revealed a normal urethra.  The bladder wall had mild trabeculation.  There was minimal mucosal erythema with some increased vascularity adjacent to the ureteral orifice which were in the normal anatomic position effluxing clear urine.  After thorough inspection, this revealed no tumors or stones.  The bladder was filled under 80 cm water pressure to capacity and then drained.  Her capacity under anesthesia was 500 mL.  Cystoscopy following hydrodistention demonstrated diffuse glomerular hemorrhages with mucosal bleeding consistent with interstitial cystitis.  The cystoscope was then removed and a 16-French silastic Foley catheter was inserted.  The balloon was filled with 10  mL sterile fluid.  The bladder was then filled with approximately 180 mL of Clorpactin solution 1 ampule and 1 L of water that was left indwelling for 2 minutes, then drained.  The bladder was then instilled with 30 mL of tetracaine solution.  The catheter was plugged.  The patient's anesthetic was reversed.  She was moved to recovery room where the catheter was placed to straight drainage.  There were no complications.     Marshall Cork. Jeffie Pollock, M.D.   ______________________________ Marshall Cork. Jeffie Pollock, M.D.    JJW/MEDQ  D:  11/08/2016  T:  11/08/2016  Job:  [id number]

## 2016-11-08 NOTE — Brief Op Note (Signed)
11/08/2016  7:42 AM  PATIENT:  Brunetta Genera  66 y.o. female  PRE-OPERATIVE DIAGNOSIS:  interstitial cystitis  POST-OPERATIVE DIAGNOSIS:  interstitial cystitis  PROCEDURE:  Procedure(s): CYSTOSCOPY/HYDRODISTENSION INSTILLATION OF CLORPACTIN (N/A)  SURGEON:  Surgeon(s) and Role:    * [name], MD - Primary  PHYSICIAN ASSISTANT:   ASSISTANTS: none   ANESTHESIA:   general  EBL:  No intake/output data recorded.  BLOOD ADMINISTERED:none  DRAINS: Urinary Catheter (Foley)   LOCAL MEDICATIONS USED:  OTHER Tetracaine  SPECIMEN:  No Specimen  DISPOSITION OF SPECIMEN:  N/A  COUNTS:  YES  TOURNIQUET:  * No tourniquets in log *  DICTATION: .Other Dictation: Dictation Number [id number]  PLAN OF CARE: Discharge to home after PACU  PATIENT DISPOSITION:  PACU - hemodynamically stable.   Delay start of Pharmacological VTE agent (>24hrs) due to surgical blood loss or risk of bleeding: not applicable

## 2016-11-08 NOTE — Interval H&P Note (Signed)
History and Physical Interval Note:  11/08/2016 7:14 AM  Brenda [name]  has presented today for surgery, with the diagnosis of interstitial cystitis  The various methods of treatment have been discussed with the patient and family. After consideration of risks, benefits and other options for treatment, the patient has consented to  Procedure(s): CYSTOSCOPY/HYDRODISTENSION INSTILLATION OF CLORPACTIN (N/A) as a surgical intervention .  The patient's history has been reviewed, patient examined, no change in status, stable for surgery.  I have reviewed the patient's chart and labs.  Questions were answered to the patient's satisfaction.     [name]

## 2016-11-08 NOTE — Transfer of Care (Signed)
Immediate Anesthesia Transfer of Care Note  Patient: Brenda [name]  Procedure(s) Performed: Procedure(s): CYSTOSCOPY/HYDRODISTENSION INSTILLATION OF CLORPACTIN (N/A)  Patient Location: PACU  Anesthesia Type:MAC  Level of Consciousness: awake, alert , oriented, drowsy and patient cooperative  Airway & Oxygen Therapy: Patient Spontanous Breathing and Patient connected to nasal cannula oxygen  Post-op Assessment: Report given to RN and Post -op Vital signs reviewed and stable  Post vital signs: Reviewed and stable  Last Vitals:  Vitals:   11/08/16 0602  BP: 129/81  Pulse: 66  Resp: 16  Temp: 36.5 C    Last Pain:  Vitals:   11/08/16 0602  TempSrc: Oral      Patients Stated Pain Goal: 7 (45/03/88 8280)  Complications: No apparent anesthesia complications

## 2016-11-08 NOTE — OR Nursing (Signed)
instillation of tetracaine hcl / 0.1% neomycin at 0741 by dr Jeffie Pollock

## 2016-11-08 NOTE — Op Note (Deleted)
  The note originally documented on this encounter has been moved the the encounter in which it belongs.  

## 2016-11-08 NOTE — Discharge Instructions (Addendum)
CYSTOSCOPY HOME CARE INSTRUCTIONS  Activity: Rest for the remainder of the day.  Do not drive or operate equipment today.  You may resume normal activities in one to two days as instructed by your physician.   Meals: Drink plenty of liquids and eat light foods such as gelatin or soup this evening.  You may return to a normal meal plan tomorrow.  Return to Work: You may return to work in one to two days or as instructed by your physician.  Special Instructions / Symptoms: Call your physician if any of these symptoms occur:   -persistent or heavy bleeding  -bleeding which continues after first few urination  -large blood clots that are difficult to pass  -urine stream diminishes or stops completely  -fever equal to or higher than 101 degrees Farenheit.  -cloudy urine with a strong, foul odor  -severe pain  Females should always wipe from front to back after elimination.  You may feel some burning pain when you urinate.  This should disappear with time.  Applying moist heat to the lower abdomen or a hot tub bath may help relieve the pain. \    Patient Signature:  ________________________________________________________  Nurse's Signature:  ________________________________________________________   Post Anesthesia Home Care Instructions  Activity: Get plenty of rest for the remainder of the day. A responsible individual must stay with you for 24 hours following the procedure.  For the next 24 hours, DO NOT: -Drive a car -Operate machinery -Drink alcoholic beverages -Take any medication unless instructed by your physician -Make any legal decisions or sign important papers.  Meals: Start with liquid foods such as gelatin or soup. Progress to regular foods as tolerated. Avoid greasy, spicy, heavy foods. If nausea and/or vomiting occur, drink only clear liquids until the nausea and/or vomiting subsides. Call your physician if vomiting continues.  Special  Instructions/Symptoms: Your throat may feel dry or sore from the anesthesia or the breathing tube placed in your throat during surgery. If this causes discomfort, gargle with warm salt water. The discomfort should disappear within 24 hours.  If you had a scopolamine patch placed behind your ear for the management of post- operative nausea and/or vomiting:  1. The medication in the patch is effective for 72 hours, after which it should be removed.  Wrap patch in a tissue and discard in the trash. Wash hands thoroughly with soap and water. 2. You may remove the patch earlier than 72 hours if you experience unpleasant side effects which may include dry mouth, dizziness or visual disturbances. 3. Avoid touching the patch. Wash your hands with soap and water after contact with the patch.     

## 2016-11-09 ENCOUNTER — Encounter (HOSPITAL_BASED_OUTPATIENT_CLINIC_OR_DEPARTMENT_OTHER): Payer: Self-pay | Admitting: Urology

## 2016-11-30 ENCOUNTER — Ambulatory Visit (INDEPENDENT_AMBULATORY_CARE_PROVIDER_SITE_OTHER): Payer: Worker's Compensation | Admitting: Orthopaedic Surgery

## 2016-11-30 ENCOUNTER — Encounter (INDEPENDENT_AMBULATORY_CARE_PROVIDER_SITE_OTHER): Payer: Self-pay | Admitting: Orthopaedic Surgery

## 2016-11-30 VITALS — BP 134/88 | HR 69 | Ht 60.0 in | Wt 145.0 lb

## 2016-11-30 DIAGNOSIS — G8929 Other chronic pain: Secondary | ICD-10-CM

## 2016-11-30 DIAGNOSIS — M25561 Pain in right knee: Secondary | ICD-10-CM

## 2016-11-30 DIAGNOSIS — M25512 Pain in left shoulder: Secondary | ICD-10-CM

## 2016-11-30 NOTE — Progress Notes (Signed)
Office Visit Note   Patient: Brenda [name]           Date of Birth: 1950-12-06           MRN: [id number] Visit Date: 11/30/2016              Requested by: Prince Solian, MD [address] [place], [place] [address] PCP: Prince Solian, MD   Assessment & Plan: Visit Diagnoses:  1. Acute pain of right knee   2. Chronic left shoulder pain   Status post on-the-job injury left knee as previously outlined. Occasionally has some difficulty with stiffness and soreness but "I can live with it". Prior MRI scan demonstrates possible tear of posterior cruciate ligament and mild strain of medial collateral ligament. Study performed outside the [facility]. Also having a problem with her left shoulder. MRI scan several months ago demonstrated mild tendinosis of the supraspinatus tendon and biceps tendon. Severe arthropathy of the acromioclavicular joint with marrow edema on either side of the joint. Presently feeling better after cortisone injection  Plan: Monitor left shoulder issues over time. Presently doing well. Settle workman's comp claim left knee. No permanent partial impairment  Follow-Up Instructions: Return if symptoms worsen or fail to improve.   Orders:  No orders of the defined types were placed in this encounter.  No orders of the defined types were placed in this encounter.     Procedures: No procedures performed   Clinical Data: No additional findings.   Subjective: Chief Complaint  Patient presents with  . Left Knee - Pain    Brenda [name] is a 66 y o that presents with chronic Left knee pain. She relates she walked a lot on Thursday. She went to a rheumatologist for possible RA.  Presently relates doing well in terms of her left shoulder after the cortisone injection. She does have significant degenerative change at the acromioclavicular joint with marrow edema on both sides. Cortisone injection has provided her significant relief to the point she is able to raise  her arm overhead or most of her activities. 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 Her left knee on occasion will be a bit sore and achy but she can "live with it". This was worked months, related. I think she is fine to settle with the insurance company without permanent partial impairment HPI  Review of Systems   Objective: Vital Signs: BP 134/88   Pulse 69   Ht 5' (1.524 m)   Wt 145 lb (65.8 kg)   BMI 28.32 kg/m   Physical Exam  Ortho Exam left shoulder with minimal tenderness over the acromioclavicular joint. Able to quickly raise her left arm overhead. No impingement or empty can testing today. Biceps intact. Neurovascular exam intact. Skin intact. No problem with range of motion of cervical spine.  Left knee without effusion. Possible popliteal cyst but not uncomfortable. No calf pain. No swelling distally. Skin intact. Neurovascular exam intact. Full quick extension and flexion over 110 without instability. No pain over the MCL or LCL. No evidence of anterior or posterior drawer sign. No patella pain. Minimal medial lateral joint discomfort without crepitation:  No specialty comments available.  Imaging: No results found.   PMFS History: Patient Active Problem List   Diagnosis Date Noted  . Persistent cough   . Moderate persistent asthma 03/19/2016  . Chronic seasonal allergic rhinitis 03/19/2016  . GERD (gastroesophageal reflux disease) 03/19/2016  . Cough 03/19/2016  . Essential hypertension, benign  03/19/2016  . H/O: CVA (cerebrovascular accident) 03/19/2016  . IBS (irritable bowel syndrome) 03/19/2016  . Interstitial cystitis 03/19/2016  . Fibromyalgia 03/19/2016  . DJD (degenerative joint disease) 03/19/2016  . Arthritis 03/19/2016  . Sphincter of Oddi dysfunction 03/19/2016  . H/O acute pancreatitis 03/19/2016   Past Medical History:  Diagnosis Date  . Anal fissure   . Anemia   . Anxiety   . Arthritis   . Bladder pain     . Chronic seasonal allergic rhinitis   . Complication of anesthesia PAIN ATTACKS   LACTATED RINGERS- CONTRAINDICATED  . Depression   . Fibromyalgia   . Frequency of urination   . GERD (gastroesophageal reflux disease)   . Heart murmur   . History of pancreatitis 1988-1989  . Hyperlipidemia   . Hypertension LABILE  . IBS (irritable bowel syndrome)   . Interstitial cystitis   . Migraines   . Moderate persistent asthma    PULMOLOIGST-  DR NESTOR ([place])  . Multiple pulmonary nodules   . Normal cardiac stress test JULY 2009---  NORMAL  . 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 Spasm of sphincter of Oddi 1987  . Urethral stenosis S/P DILATIONS  . Urgency of urination     Family History  Problem Relation Age of Onset  . Colon cancer Maternal Grandmother   . Colon cancer Cousin   . Allergies Mother   . Asthma Mother   . Heart disease Mother   . Osteoarthritis Mother   . Diabetes Mother   . Irritable bowel syndrome Mother   . Brain cancer Father   . Osteoarthritis Father   . Colon polyps Father        adenomatous  . Heart disease Father   . Melanoma Sister   . Osteoarthritis Sister   . Irritable bowel syndrome Sister   . Diabetes Sister   . Heart disease Sister   . Stroke Sister   . Ulcerative colitis Maternal Uncle   . Stomach cancer Neg Hx   . Rheumatologic disease Neg Hx     Past Surgical History:  Procedure Laterality Date  . ABDOMINAL HYSTERECTOMY  1988  . APPENDECTOMY  04/ 1986  . bilateral reduction mastopexy  1988  . bilateral turbinate resection  1990   nasal spreading graft  . CATARACT EXTRACTION W/ INTRAOCULAR LENS  IMPLANT, BILATERAL  08 and 09/ 2017  . CHOLECYSTECTOMY  04/ 1986  . COLONOSCOPY  02/2004, 11/2010   2005: Normal - Dr. Sammuel Cooper 2012: Normal  . CYSTO WITH HYDRODISTENSION N/A 12/17/2013   Procedure: CYSTO HYDRODISTENSION WITH INSTALLATION OF CHLOROPACTIN AND  TETRACAINE;  Surgeon: Malka So, MD;  Location: [facility];  Service: Urology;  Laterality: N/A;  . CYSTO WITH HYDRODISTENSION N/A 06/02/2015   Procedure: CYSTOSCOPY/HYDRODISTENSION WITH INSTILLATION OF CLORPACTIN ;  Surgeon: [name], MD;  Location[facility];  Service: Urology;  Laterality: N/A;  . CYSTO WITH HYDRODISTENSION N/A 11/08/2016   Procedure: CYSTOSCOPY/HYDRODISTENSION INSTILLATION OF CLORPACTIN;  Surgeon: [name], MD;  Location[facility];  Service: Urology;  Laterality: N/A;  . CYSTOSCOPY WITH URETHRAL DILATATION  07/12/2011   Procedure: CYSTOSCOPY WITH URETHRAL DILATATION;  Surgeon: Malka So, MD;  Location: [facility];  Service: Urology;  Laterality: N/A;  HOD AND INSTILLATION OF CHLOROPACTIN C-ARM   . CYSTOSCOPY WITH URETHRAL DILATATION N/A 06/26/2012  Procedure: CYSTOSCOPY WITH URETHRAL DILATATION HYDRODISTENSION AND INSTILLATION OF CLORPACTION ;  Surgeon: Malka So, MD;  Location: [facility];  Service: Urology;  Laterality: N/A;  CYSTOSCOPY WITH URETHRAL DILATATION HYDRODISTENSION AND INSTILLATION OF CLORPACTION   . CYSTOSTOMY W/ BLADDER BIOPSY  1983  . DILATION AND CURETTAGE OF UTERUS  1987  . ERCP  1988   for pancreatitis with stents   S/P SEVERE PANCREATITIS  . HEMORRHOID SURGERY    . LASIK Bilateral APRIL 2005  . MULTIPLE CYSTO/ HOD/ URETHRAL DILATION/ INSTILLATION CLORPACTIN  .LAST ONE 03-31-2010  . NASAL SINUS SURGERY  1977   sinus cyst  . REDUCTION MAMMAPLASTY Bilateral 1988  . revision rhinoplasty  1978  . RHINOPLASTY  1977  . RIGHT SALPINGOOPHECTOMY  1998  . TONSILLECTOMY AND ADENOIDECTOMY  1963  . VIDEO BRONCHOSCOPY Bilateral 08/23/2016   Procedure: VIDEO BRONCHOSCOPY WITHOUT FLUORO;  Surgeon: Javier Glazier, MD;  Location: [facility];  Service: Cardiopulmonary;  Laterality: Bilateral;   Social History   Occupational History  . RN    Social History Main Topics   . Smoking status: Passive Smoke Exposure - Never Smoker  . Smokeless tobacco: Never Used     Comment: Father smoked briefly  . Alcohol use No  . Drug use: No  . Sexual activity: Not on file     Garald Balding, MD   Note - This record has been created using [employer].  Chart creation errors have been sought, but may not always  have been located. Such creation errors do not reflect on  the standard of medical care.

## 2016-12-04 ENCOUNTER — Encounter (HOSPITAL_COMMUNITY): Payer: Self-pay | Admitting: Emergency Medicine

## 2016-12-04 ENCOUNTER — Emergency Department (HOSPITAL_COMMUNITY): Payer: PPO

## 2016-12-04 ENCOUNTER — Emergency Department (HOSPITAL_COMMUNITY)
Admission: EM | Admit: 2016-12-04 | Discharge: 2016-12-04 | Disposition: A | Discharge status: Home / Self Care | Payer: PPO | Attending: Emergency Medicine | Admitting: Emergency Medicine

## 2016-12-04 DIAGNOSIS — J45909 Unspecified asthma, uncomplicated: Secondary | ICD-10-CM | POA: Insufficient documentation

## 2016-12-04 DIAGNOSIS — R079 Chest pain, unspecified: Secondary | ICD-10-CM

## 2016-12-04 DIAGNOSIS — Z91040 Latex allergy status: Secondary | ICD-10-CM | POA: Insufficient documentation

## 2016-12-04 DIAGNOSIS — F419 Anxiety disorder, unspecified: Secondary | ICD-10-CM | POA: Insufficient documentation

## 2016-12-04 DIAGNOSIS — Z9049 Acquired absence of other specified parts of digestive tract: Secondary | ICD-10-CM | POA: Diagnosis not present

## 2016-12-04 DIAGNOSIS — Z7722 Contact with and (suspected) exposure to environmental tobacco smoke (acute) (chronic): Secondary | ICD-10-CM | POA: Diagnosis not present

## 2016-12-04 DIAGNOSIS — Z79899 Other long term (current) drug therapy: Secondary | ICD-10-CM | POA: Insufficient documentation

## 2016-12-04 DIAGNOSIS — F329 Major depressive disorder, single episode, unspecified: Secondary | ICD-10-CM | POA: Insufficient documentation

## 2016-12-04 DIAGNOSIS — I10 Essential (primary) hypertension: Secondary | ICD-10-CM | POA: Insufficient documentation

## 2016-12-04 LAB — I-STAT TROPONIN, ED: Troponin i, poc: 0 ng/mL (ref 0.00–0.08)

## 2016-12-04 LAB — CBC
HCT: 42 % (ref 36.0–46.0)
Hemoglobin: 14.2 g/dL (ref 12.0–15.0)
MCH: 31.6 pg (ref 26.0–34.0)
MCHC: 33.8 g/dL (ref 30.0–36.0)
MCV: 93.3 fL (ref 78.0–100.0)
Platelets: 322 10*3/uL (ref 150–400)
RBC: 4.5 MIL/uL (ref 3.87–5.11)
RDW: 13.1 % (ref 11.5–15.5)
WBC: 9.9 10*3/uL (ref 4.0–10.5)

## 2016-12-04 LAB — BASIC METABOLIC PANEL
Anion gap: 10 (ref 5–15)
BUN: 12 mg/dL (ref 6–20)
CO2: 23 mmol/L (ref 22–32)
Calcium: 9.6 mg/dL (ref 8.9–10.3)
Chloride: 105 mmol/L (ref 101–111)
Creatinine, Ser: 0.95 mg/dL (ref 0.44–1.00)
GFR calc Af Amer: >60 mL/min (ref >60)
GFR calc non Af Amer: >60 mL/min (ref >60)
Glucose, Bld: 114 mg/dL — ABNORMAL HIGH (ref 65–99)
Potassium: 4.3 mmol/L (ref 3.5–5.1)
Sodium: 138 mmol/L (ref 135–145)

## 2016-12-04 LAB — D-DIMER, QUANTITATIVE (NOT AT ARMC): D-Dimer, Quant: 0.27 ug/mL-FEU (ref 0.00–0.50)

## 2016-12-04 MED ORDER — IBUPROFEN 400 MG PO TABS
400.0000 mg | ORAL_TABLET | Freq: Once | ORAL | Status: DC
  Filled 2016-12-04: qty 1

## 2016-12-04 MED ORDER — OMEPRAZOLE 20 MG PO CPDR: 20.0000 mg | DELAYED_RELEASE_CAPSULE | Freq: Every day | ORAL | 0 refills | Status: DC

## 2016-12-04 MED ORDER — ACETAMINOPHEN 325 MG PO TABS
650.0000 mg | ORAL_TABLET | Freq: Once | ORAL | Status: AC
  Administered 2016-12-04: 650 mg via ORAL
  Filled 2016-12-04: qty 2

## 2016-12-04 NOTE — ED Provider Notes (Signed)
[place] [facility] DEPT Provider Note   CSN: [id number] Arrival date & time: 12/04/16  1343     History   Chief Complaint Chief Complaint  Patient presents with  . Chest Pain    HPI Brenda [name] is a 66 y.o. female.  HPI Pt has been having trouble with chest pain the last few days.   The pain has been constant.  Palpation does increase the pain. The pain is sharp, it increases with deep breathing.  She has tried asa without relief.    No fevers.  No cough.  No leg swelling or edema.  She has been having trouble for the past year.  She has seen pulmonary and had an extensive workup.  The exact cause was ot determined.  No hx of heart attack.  No history of PE. Past Medical History:  Diagnosis Date  . Anal fissure   . Anemia   . Anxiety   . Arthritis   . Bladder pain   . Chronic seasonal allergic rhinitis   . Complication of anesthesia PAIN ATTACKS   LACTATED RINGERS- CONTRAINDICATED  . Depression   . Fibromyalgia   . Frequency of urination   . GERD (gastroesophageal reflux disease)   . Heart murmur   . History of pancreatitis 1988-1989  . Hyperlipidemia   . Hypertension LABILE  . IBS (irritable bowel syndrome)   . Interstitial cystitis   . Migraines   . Moderate persistent asthma    PULMOLOIGST-  DR NESTOR ([place])  . Multiple pulmonary nodules   . Normal cardiac stress test JULY 2009---  NORMAL  . 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 Spasm of sphincter of Oddi 1987  . Urethral stenosis S/P DILATIONS  . Urgency of urination     Patient Active Problem List   Diagnosis Date Noted  . Persistent cough   . Moderate persistent asthma 03/19/2016  . Chronic seasonal allergic rhinitis 03/19/2016  . GERD (gastroesophageal reflux disease) 03/19/2016  . Cough 03/19/2016  . Essential hypertension, benign 03/19/2016  . H/O: CVA (cerebrovascular accident) 03/19/2016  . IBS (irritable bowel  syndrome) 03/19/2016  . Interstitial cystitis 03/19/2016  . Fibromyalgia 03/19/2016  . DJD (degenerative joint disease) 03/19/2016  . Arthritis 03/19/2016  . Sphincter of Oddi dysfunction 03/19/2016  . H/O acute pancreatitis 03/19/2016    Past Surgical History:  Procedure Laterality Date  . ABDOMINAL HYSTERECTOMY  1988  . APPENDECTOMY  04/ 1986  . bilateral reduction mastopexy  1988  . bilateral turbinate resection  1990   nasal spreading graft  . CATARACT EXTRACTION W/ INTRAOCULAR LENS  IMPLANT, BILATERAL  08 and 09/ 2017  . CHOLECYSTECTOMY  04/ 1986  . COLONOSCOPY  02/2004, 11/2010   2005: Normal - Dr. Sammuel Cooper 2012: Normal  . CYSTO WITH HYDRODISTENSION N/A 12/17/2013   Procedure: CYSTO HYDRODISTENSION WITH INSTALLATION OF CHLOROPACTIN AND TETRACAINE;  Surgeon: Malka So, MD;  Location[facility];  Service: Urology;  Laterality: N/A;  . CYSTO WITH HYDRODISTENSION N/A 06/02/2015   Procedure: CYSTOSCOPY/HYDRODISTENSION WITH INSTILLATION OF CLORPACTIN ;  Surgeon: [name], MD;  Location: [facility];  Service: Urology;  Laterality: N/A;  . CYSTO WITH HYDRODISTENSION N/A 11/08/2016   Procedure: CYSTOSCOPY/HYDRODISTENSION INSTILLATION OF CLORPACTIN;  Surgeon: [name], MD;  Location: [facility];  Service: Urology;  Laterality: N/A;  . CYSTOSCOPY WITH URETHRAL DILATATION  07/12/2011  Procedure: CYSTOSCOPY WITH URETHRAL DILATATION;  Surgeon: Malka So, MD;  Location: [facility];  Service: Urology;  Laterality: N/A;  HOD AND INSTILLATION OF CHLOROPACTIN C-ARM   . CYSTOSCOPY WITH URETHRAL DILATATION N/A 06/26/2012   Procedure: CYSTOSCOPY WITH URETHRAL DILATATION HYDRODISTENSION AND INSTILLATION OF CLORPACTION ;  Surgeon: Malka So, MD;  Location: [facility];  Service: Urology;  Laterality: N/A;  CYSTOSCOPY WITH URETHRAL DILATATION HYDRODISTENSION AND INSTILLATION OF CLORPACTION   . CYSTOSTOMY W/ BLADDER  BIOPSY  1983  . DILATION AND CURETTAGE OF UTERUS  1987  . ERCP  1988   for pancreatitis with stents   S/P SEVERE PANCREATITIS  . HEMORRHOID SURGERY    . LASIK Bilateral APRIL 2005  . MULTIPLE CYSTO/ HOD/ URETHRAL DILATION/ INSTILLATION CLORPACTIN  .LAST ONE 03-31-2010  . NASAL SINUS SURGERY  1977   sinus cyst  . REDUCTION MAMMAPLASTY Bilateral 1988  . revision rhinoplasty  1978  . RHINOPLASTY  1977  . RIGHT SALPINGOOPHECTOMY  1998  . TONSILLECTOMY AND ADENOIDECTOMY  1963  . VIDEO BRONCHOSCOPY Bilateral 08/23/2016   Procedure: VIDEO BRONCHOSCOPY WITHOUT FLUORO;  Surgeon: Javier Glazier, MD;  Location: [facility];  Service: Cardiopulmonary;  Laterality: Bilateral;    OB History    No data available       Home Medications    Prior to Admission medications   Medication Sig Start Date End Date Taking? Authorizing Provider  acetaminophen (TYLENOL) 325 MG tablet Take 325 mg by mouth every 6 (six) hours as needed (for headaches).   Yes [provider]  albuterol (VENTOLIN HFA) 108 (90 Base) MCG/ACT inhaler Inhale into the lungs every 6 (six) hours as needed for wheezing or shortness of breath.   Yes [provider]  Calcium Carbonate-Vitamin D (CALCIUM + D PO) Take 1 tablet by mouth daily.    Yes [provider]  Coenzyme Q10 (CO Q-10 PO) Take 1 capsule by mouth 2 (two) times a week.   Yes [provider]  ezetimibe (ZETIA) 10 MG tablet Take 10 mg by mouth daily.    Yes [provider]  fluticasone (FLONASE ALLERGY RELIEF) 50 MCG/ACT nasal spray Place 1 spray into both nostrils daily.   Yes [provider]  folic acid (FOLVITE) 1 MG tablet Take 1 mg by mouth daily.   Yes [provider]  LORazepam (ATIVAN) 1 MG tablet Take 1 mg by mouth every 8 (eight) hours as needed for anxiety.    Yes [provider]  losartan (COZAAR) 50 MG tablet Take 50 mg by mouth daily. 11/29/16  Yes [provider]  metoprolol  (LOPRESSOR) 50 MG tablet Take 25 mg by mouth 2 (two) times daily.    Yes [provider]  montelukast (SINGULAIR) 10 MG tablet Take 10 mg by mouth daily.    Yes [provider]  Multiple Vitamin (MULTIVITAMIN) tablet Take 1 tablet by mouth daily.   Yes [provider]  Omega-3 Fatty Acids (FISH OIL PO) Take 1 capsule by mouth 2 (two) times a week.   Yes [provider]  ranitidine (ZANTAC) 150 MG tablet Take 1 tablet (150 mg total) by mouth at bedtime. Patient taking differently: Take 150 mg by mouth daily.  04/17/16 04/17/17 Yes Javier Glazier, MD  Vitamin D, Ergocalciferol, (DRISDOL) 50000 UNITS CAPS Take 50,000 Units by mouth every 7 (seven) days.     Yes [provider]  Azelastine-Fluticasone (DYMISTA) 137-50 MCG/ACT SUSP Place 1 spray into  the nose daily. Patient not taking: Reported on 12/04/2016 08/23/16   Javier Glazier, MD  omeprazole (PRILOSEC) 20 MG capsule Take 1 capsule (20 mg total) by mouth daily. 12/04/16   Dorie Rank, MD    Family History Family History  Problem Relation Age of Onset  . Colon cancer Maternal Grandmother   . Colon cancer Cousin   . Allergies Mother   . Asthma Mother   . Heart disease Mother   . Osteoarthritis Mother   . Diabetes Mother   . Irritable bowel syndrome Mother   . Brain cancer Father   . Osteoarthritis Father   . Colon polyps Father        adenomatous  . Heart disease Father   . Melanoma Sister   . Osteoarthritis Sister   . Irritable bowel syndrome Sister   . Diabetes Sister   . Heart disease Sister   . Stroke Sister   . Ulcerative colitis Maternal Uncle   . Stomach cancer Neg Hx   . Rheumatologic disease Neg Hx     Social History Social History  Substance Use Topics  . Smoking status: Passive Smoke Exposure - Never Smoker  . Smokeless tobacco: Never Used     Comment: Father smoked briefly  . Alcohol use No     Allergies   Latex; Adhesive [tape]; Bupivacaine; Butorphanol  tartrate; Clarithromycin; Codeine; Cymbalta [duloxetine hcl]; Dilaudid [hydromorphone hcl]; Elavil [amitriptyline hcl]; Hydrocodone; Hyoscyamine sulfate; Lactated ringers; Levbid [hyoscyamine sulfate]; Lisinopril; Marcaine [bupivacaine hcl]; Pentazocine lactate; Percodan [oxycodone-aspirin]; Stadol [butorphanol tartrate]; Trazodone and nefazodone; Vistaril [hydroxyzine hcl]; and Morphine and related   Review of Systems Review of Systems  All other systems reviewed and are negative.    Physical Exam Updated Vital Signs BP 120/77   Pulse 67   Temp 97.7 F (36.5 C) (Oral)   Resp 17   Ht 1.524 m (5')   Wt 62.1 kg (137 lb)   SpO2 100%   BMI 26.76 kg/m   Physical Exam  Constitutional: She appears well-developed and well-nourished. No distress.  HENT:  Head: Normocephalic and atraumatic.  Right Ear: External ear normal.  Left Ear: External ear normal.  Eyes: Conjunctivae are normal. Right eye exhibits no discharge. Left eye exhibits no discharge. No scleral icterus.  Neck: Neck supple. No tracheal deviation present.  Cardiovascular: Normal rate, regular rhythm and intact distal pulses.   Pulmonary/Chest: Effort normal and breath sounds normal. No stridor. No respiratory distress. She has no wheezes. She has no rales.  Abdominal: Soft. Bowel sounds are normal. She exhibits no distension. There is no tenderness. There is no rebound and no guarding.  Musculoskeletal: She exhibits no edema or tenderness.  Neurological: She is alert. She has normal strength. No cranial nerve deficit (no facial droop, extraocular movements intact, no slurred speech) or sensory deficit. She exhibits normal muscle tone. She displays no seizure activity. Coordination normal.  Skin: Skin is warm and dry. No rash noted.  Psychiatric: She has a normal mood and affect.  Nursing note and vitals reviewed.    ED Treatments / Results  Labs (all labs ordered are listed, but only abnormal results are  displayed) Labs Reviewed  BASIC METABOLIC PANEL - Abnormal; Notable for the following:       Result Value   Glucose, Bld 114 (*)    All other components within normal limits  CBC  D-DIMER, QUANTITATIVE (NOT AT [facility])  I-STAT TROPONIN, ED    EKG  EKG Interpretation  Date/Time:  Tuesday December 04 2016 13:44:50 EDT Ventricular Rate:  71 PR Interval:  144 QRS Duration: 70 QT Interval:  362 QTC Calculation: 393 R Axis:   21 Text Interpretation:  Normal sinus rhythm Low voltage QRS Borderline ECG No significant change since last tracing Confirmed by Dorie Rank [phone]) on 12/04/2016 2:33:28 PM       Radiology Dg Chest 2 View  Result Date: 12/04/2016 CLINICAL DATA:  Chest pain and hypertension EXAM: CHEST  2 VIEW COMPARISON:  Chest radiograph June 07, 2016 and chest CT July 17, 2016 FINDINGS: There is no edema or consolidation. The heart size and pulmonary vascularity are normal. No adenopathy. No pneumothorax. No bone lesions. IMPRESSION: No edema or consolidation. Electronically Signed   By: Lowella Grip III M.D.   On: 12/04/2016 16:04    Procedures Procedures (including critical care time)  Medications Ordered in ED Medications  ibuprofen (ADVIL,MOTRIN) tablet 400 mg (400 mg Oral Not Given 12/04/16 1459)  acetaminophen (TYLENOL) tablet 650 mg (650 mg Oral Given 12/04/16 1534)     Initial Impression / Assessment and Plan / ED Course  I have reviewed the triage vital signs and the nursing notes.  Pertinent labs & imaging results that were available during my care of the patient were reviewed by me and considered in my medical decision making (see chart for details).   Atypical sx ongoing for a long period of time.  Constant pain for the last several days.  Negative troponin and d dimer.  Doubt ACS, dissection, PE.  At this time there does not appear to be any evidence of an acute emergency medical condition and the patient appears stable for discharge with appropriate  outpatient follow up.   Final Clinical Impressions(s) / ED Diagnoses   Final diagnoses:  Chest pain, unspecified type    New Prescriptions New Prescriptions   OMEPRAZOLE (PRILOSEC) 20 MG CAPSULE    Take 1 capsule (20 mg total) by mouth daily.     Dorie Rank, MD 12/04/16 [phone]

## 2016-12-04 NOTE — ED Triage Notes (Signed)
Cp since a couple of days comes and goes now staying, some sob. Some radiation to left arm pit

## 2016-12-04 NOTE — Discharge Instructions (Signed)
Follow up with your doctor, take the medications as prescribed, return as needed for worsening symptoms

## 2016-12-07 ENCOUNTER — Telehealth (INDEPENDENT_AMBULATORY_CARE_PROVIDER_SITE_OTHER): Payer: Self-pay

## 2016-12-07 NOTE — Telephone Encounter (Signed)
Faxed the 11/30/16 office note to [facility] oer her request

## 2016-12-13 DIAGNOSIS — N3010 Interstitial cystitis (chronic) without hematuria: Secondary | ICD-10-CM | POA: Diagnosis not present

## 2016-12-20 ENCOUNTER — Encounter: Payer: Self-pay | Admitting: Pulmonary Disease

## 2016-12-20 ENCOUNTER — Ambulatory Visit (INDEPENDENT_AMBULATORY_CARE_PROVIDER_SITE_OTHER): Payer: PPO | Admitting: Pulmonary Disease

## 2016-12-20 VITALS — BP 108/64 | HR 80 | Ht 60.0 in | Wt 142.4 lb

## 2016-12-20 DIAGNOSIS — R05 Cough: Secondary | ICD-10-CM

## 2016-12-20 DIAGNOSIS — R918 Other nonspecific abnormal finding of lung field: Secondary | ICD-10-CM

## 2016-12-20 DIAGNOSIS — J4540 Moderate persistent asthma, uncomplicated: Secondary | ICD-10-CM | POA: Diagnosis not present

## 2016-12-20 DIAGNOSIS — Z23 Encounter for immunization: Secondary | ICD-10-CM

## 2016-12-20 DIAGNOSIS — R053 Chronic cough: Secondary | ICD-10-CM

## 2016-12-20 MED ORDER — AZELASTINE-FLUTICASONE 137-50 MCG/ACT NA SUSP: 1.0000 | Freq: Every day | NASAL | 3 refills | Status: DC

## 2016-12-20 NOTE — Patient Instructions (Signed)
   Call or e-mail me if you feel your cough is getting worse.  I will see you back in 3 months or sooner if needed.  I'm keeping you on all of your same medications.  TESTS ORDERED: 1. Spirometry with DLCO at next appointment

## 2016-12-20 NOTE — Progress Notes (Signed)
Subjective:    Patient ID: Brenda [name], female    DOB: 06-11-1950, 66 y.o.   MRN: [id number]  C.C.:  Follow-up for Chronic Cough, Multiple Lung Nodules, Moderate, Persistent Asthma, GERD, & Chronic Seasonal Allergic Rhinitis.   HPI  Chronic cough: Cultures consistently negative. Previously underwent bronchoscopy with negative cultures. She feels her cough isn't as bad as previous. She is still clearing her throat [name]. He cough produces a clear mucus. Minimal wheezing.   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 Imaging previously showed upper lobe predominant centrilobular micronodules. Patient was evaluated by rheumatology.  Moderate persistent asthma: Previously on Singulair. Previously tried on Breo and Symbicort without relief of cough. She is using her rescue inhaler intermittently with some help. She switched off of Dysmista back to Flonase with some increase in her sinus congestion.   Chronic seasonal allergic rhinitis: Previously prescribed Singulair and Flonase. At previous appointment patient had dramatic relief on Dymista. She is still having some sinus congestion & drainage of clear mucus.   GERD: Following with GI (Dr. Carlean Purl). Prescribed Protonix and Zantac. No abdominal pain or reflux.  Review of Systems  no subjective fever, chills, or sweats. No rashes or abnormal bruising. No headache or vision changes.  Allergies  Allergen Reactions  . Latex Rash  . Adhesive [Tape] Other (See Comments)    TEARS SKIN; PLEASE USE COBAN WRAP!!  . Bupivacaine Other (See Comments)    Other Reaction: PRE-SYNCOPE, TACTYCARDIA TACHYCARDIA/ PRESYNCOPE  . Butorphanol Tartrate Other (See Comments)    NUMBNESS, TACHYCARDIA  . Clarithromycin Nausea Only and Other (See Comments)    TACHYCARDIA  . Codeine Other (See Comments)    SEVERE ELEVATED LIVER ENZYMES  . Cymbalta [Duloxetine Hcl] Other (See Comments)    Blurred vision  . Dilaudid  [Hydromorphone Hcl] Itching    SEVERE ITCHING  . Elavil [Amitriptyline Hcl] Other (See Comments)    Elevated liver enzymes  . Hydrocodone Other (See Comments)    Elevated liver enzymes  . Hyoscyamine Sulfate Other (See Comments)    Dizziness, nausea, headache  . Lactated Ringers Other (See Comments)    COULD CAUSE PANIC ATTACK  . Levbid [Hyoscyamine Sulfate] Other (See Comments)    Dizziness, nausea, headache  . Lisinopril Cough    Dry cough  . Marcaine [Bupivacaine Hcl] Other (See Comments)    TACHYCARDIA, PRESYNCOPE  . Pentazocine Lactate Other (See Comments)    Tachycardia   . Percodan [Oxycodone-Aspirin] Nausea Only  . Stadol [Butorphanol Tartrate] Swelling    Lips numb, tongue swell, tachycardia  . Trazodone And Nefazodone Other (See Comments)    Tachycardia   . Vistaril [Hydroxyzine Hcl] Other (See Comments)    Altered sensorium  . Morphine And Related Itching and Rash    Current Outpatient Prescriptions on File Prior to Visit  Medication Sig Dispense Refill  . acetaminophen (TYLENOL) 325 MG tablet Take 325 mg by mouth every 6 (six) hours as needed (for headaches).    Marland Kitchen albuterol (VENTOLIN HFA) 108 (90 Base) MCG/ACT inhaler Inhale into the lungs every 6 (six) hours as needed for wheezing or shortness of breath.    . Calcium Carbonate-Vitamin D (CALCIUM + D PO) Take 1 tablet by mouth [name].     . Coenzyme Q10 (CO Q-10 PO) Take 1 capsule by mouth 2 (two) times a week.    . ezetimibe (ZETIA) 10 MG tablet Take 10 mg by mouth [name].     . fluticasone (FLONASE ALLERGY RELIEF)  50 MCG/ACT nasal spray Place 1 spray into both nostrils as needed.     . folic acid (FOLVITE) 1 MG tablet Take 1 mg by mouth [name].    Marland Kitchen LORazepam (ATIVAN) 1 MG tablet Take 1 mg by mouth every 8 (eight) hours as needed for anxiety.     . metoprolol (LOPRESSOR) 50 MG tablet Take 25 mg by mouth 2 (two) times [name].     . montelukast (SINGULAIR) 10 MG tablet Take 10 mg by mouth [name].     . Multiple  Vitamin (MULTIVITAMIN) tablet Take 1 tablet by mouth [name].    . Omega-3 Fatty Acids (FISH OIL PO) Take 1 capsule by mouth 2 (two) times a week.    . ranitidine (ZANTAC) 150 MG tablet Take 1 tablet (150 mg total) by mouth at bedtime. (Patient taking differently: Take 150 mg by mouth [name]. ) 30 tablet 3  . Vitamin D, Ergocalciferol, (DRISDOL) 50000 UNITS CAPS Take 50,000 Units by mouth every 7 (seven) days.       No current facility-administered medications on file prior to visit.     Past Medical History:  Diagnosis Date  . Anal fissure   . Anemia   . Anxiety   . Arthritis   . Bladder pain   . Chronic seasonal allergic rhinitis   . Complication of anesthesia PAIN ATTACKS   LACTATED RINGERS- CONTRAINDICATED  . Depression   . Fibromyalgia   . Frequency of urination   . GERD (gastroesophageal reflux disease)   . Heart murmur   . History of pancreatitis 1988-1989  . Hyperlipidemia   . Hypertension LABILE  . IBS (irritable bowel syndrome)   . Interstitial cystitis   . Migraines   . Moderate persistent asthma    PULMOLOIGST-  DR Jsiah Menta ([place])  . Multiple pulmonary nodules   . Normal cardiac stress test JULY 2009---  NORMAL  . 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 Spasm of sphincter of Oddi 1987  . Urethral stenosis S/P DILATIONS  . Urgency of urination     Past Surgical History:  Procedure Laterality Date  . ABDOMINAL HYSTERECTOMY  1988  . APPENDECTOMY  04/ 1986  . bilateral reduction mastopexy  1988  . bilateral turbinate resection  1990   nasal spreading graft  . CATARACT EXTRACTION W/ INTRAOCULAR LENS  IMPLANT, BILATERAL  08 and 09/ 2017  . CHOLECYSTECTOMY  04/ 1986  . COLONOSCOPY  02/2004, 11/2010   2005: Normal - Dr. Sammuel Cooper 2012: Normal  . CYSTO WITH HYDRODISTENSION N/A 12/17/2013   Procedure: CYSTO HYDRODISTENSION WITH INSTALLATION OF CHLOROPACTIN AND TETRACAINE;  Surgeon: Malka So,  MD;  Location: [facility];  Service: Urology;  Laterality: N/A;  . CYSTO WITH HYDRODISTENSION N/A 06/02/2015   Procedure: CYSTOSCOPY/HYDRODISTENSION WITH INSTILLATION OF CLORPACTIN ;  Surgeon: [name], MD;  Location[facility];  Service: Urology;  Laterality: N/A;  . CYSTO WITH HYDRODISTENSION N/A 11/08/2016   Procedure: CYSTOSCOPY/HYDRODISTENSION INSTILLATION OF CLORPACTIN;  Surgeon: [name], MD;  Location: [facility];  Service: Urology;  Laterality: N/A;  . CYSTOSCOPY WITH URETHRAL DILATATION  07/12/2011   Procedure: CYSTOSCOPY WITH URETHRAL DILATATION;  Surgeon: Malka So, MD;  Location: [facility];  Service: Urology;  Laterality: N/A;  HOD AND INSTILLATION OF CHLOROPACTIN C-ARM   . CYSTOSCOPY WITH URETHRAL DILATATION N/A 06/26/2012   Procedure:  CYSTOSCOPY WITH URETHRAL DILATATION HYDRODISTENSION AND INSTILLATION OF CLORPACTION ;  Surgeon: Malka So, MD;  Location[facility];  Service: Urology;  Laterality: N/A;  CYSTOSCOPY WITH URETHRAL DILATATION HYDRODISTENSION AND INSTILLATION OF CLORPACTION   . CYSTOSTOMY W/ BLADDER BIOPSY  1983  . DILATION AND CURETTAGE OF UTERUS  1987  . ERCP  1988   for pancreatitis with stents   S/P SEVERE PANCREATITIS  . HEMORRHOID SURGERY    . LASIK Bilateral APRIL 2005  . MULTIPLE CYSTO/ HOD/ URETHRAL DILATION/ INSTILLATION CLORPACTIN  .LAST ONE 03-31-2010  . NASAL SINUS SURGERY  1977   sinus cyst  . REDUCTION MAMMAPLASTY Bilateral 1988  . revision rhinoplasty  1978  . RHINOPLASTY  1977  . RIGHT SALPINGOOPHECTOMY  1998  . TONSILLECTOMY AND ADENOIDECTOMY  1963  . VIDEO BRONCHOSCOPY Bilateral 08/23/2016   Procedure: VIDEO BRONCHOSCOPY WITHOUT FLUORO;  Surgeon: Javier Glazier, MD;  Location: [facility];  Service: Cardiopulmonary;  Laterality: Bilateral;    Family History  Problem Relation Age of Onset  . Colon cancer Maternal Grandmother   . Colon cancer Cousin     . Allergies Mother   . Asthma Mother   . Heart disease Mother   . Osteoarthritis Mother   . Diabetes Mother   . Irritable bowel syndrome Mother   . Brain cancer Father   . Osteoarthritis Father   . Colon polyps Father        adenomatous  . Heart disease Father   . Melanoma Sister   . Osteoarthritis Sister   . Irritable bowel syndrome Sister   . Diabetes Sister   . Heart disease Sister   . Stroke Sister   . Ulcerative colitis Maternal Uncle   . Stomach cancer Neg Hx   . Rheumatologic disease Neg Hx     Social History   Social History  . Marital status: Married    Spouse name: N/A  . Number of children: 0  . Years of education: N/A   Occupational History  . RN    Social History Main Topics  . Smoking status: Passive Smoke Exposure - Never Smoker  . Smokeless tobacco: Never Used     Comment: Father smoked briefly  . Alcohol use No  . Drug use: No  . Sexual activity: Not Asked   Other Topics Concern  . None   Social History Narrative   Married, lives with spouse   No children   RN at [employer]   No recent travel      [facility]:   Originally from [place]. Always lived in [place]. Previously has traveled to [place], [place], [place], [place], & mostly [place]. No indoor pets currently. She does have cats in her garage. No bird, mold, or hot tub exposure. No indoor plants. Mostly wood floors. She did have her bedroom carpet taken up in Summer 2017. Has mostly blinds. No wood burning fire place. Previously enjoyed Firefighter & playing piano.       Objective:   Physical Exam BP 108/64 (BP Location: Left Arm, Patient Position: Sitting, Cuff Size: Normal)   Pulse 80   Ht 5' (1.524 m)   Wt 142 lb 6.4 oz (64.6 kg)   SpO2 97%   BMI 27.81 kg/m   General:  Awake. Alert. Caucasian female. Integument:  Warm & dry. No rash on exposed skin. No bruising. Lymphatics: No appreciated cervical or supraclavicular lymphadenopathy. HEENT:  Moist mucus membranes. No  nasal turbinate swelling. No oral ulcers. Cardiovascular:  Regular rate. No edema. Normal S1 & S2.  Pulmonary:  Good aeration bilaterally. Clear with auscultation. Abdomen: Soft. Normal bowel sounds. Mildly protuberant. Musculoskeletal:  Normal bulk and tone. No joint deformity or effusion appreciated.  PFT 04/18/16: FVC 2.06 L (76%) FEV1 1.84 L (80%) FEV1/FVC 0.89 FEF 25-75 3.03 L (156%) negative bronchodilator response TLC 43 L (108%) RV 139% ERV 25% DLCO uncorrected 67% 10/20/13: FVC 2.14 L (86%) FEV1 1.98 L (92%) FEV1/FVC 0.82 FEF 25-75 2.14 L (104%) negative bronchodilator response TLC 3.73 L (83%) RV 70% ERV 65% DLCO uncorrected 92%  IMAGING CT CHEST W/O 07/17/16 (previously reviewed by me): Upper lobe predominant centrilobular groundglass micronodules. He with minimal subpleural reticulation. Seems to have progressed somewhat. No pleural effusion or thickening. No pericardial effusion. No pathologic mediastinal adenopathy.  CXR PA/LAT 06/07/16 (previously reviewed by me):  No focal opacity or mass appreciated. No pleural effusion. Heart normal in size & mediastinum normal in contour.  MAXILLOFACIAL CT LTD W/O 04/19/16 (per radiologist): Visualized paranasal sinuses are essentially clear.  BARIUM SWALLOW/ESOPHAGRAM 04/16/16 (per radiologist):  Mild gastroesophageal reflux. No stricture.   CT CHEST W/O 03/28/15 (previously reviewed by me):  No parenchymal nodule or opacity appreciated. No pleural effusion or thickening. No pericardial effusion. No pathologic mediastinal adenopathy.  MICROBIOLOGY Sputum Culture (09/13/16):  Normal Respiratory Flora / AFB negative / Fungus negative  Right Middle Lobe BAL (08/23/16):  Normal Respiratory Flora / AFB negative / Fungus negative / Aspergillus Antigen 0.05 Right Upper Lobe BAL (08/23/16):  Normal Respiratory Flora / AFB negative / Fungus negative Sputum Culture (06/14/16):  Normal Oral Flora / AFB negative / Scant growth Yeast without further  growth Sputum Culture (04/19/16):  Scant Yeast not identified / AFB negative / Normal Oral Flora Sputum Culture (05/04/16):  Normal Oral Flora  LABS 09/13/16 CRP: 0.2 ESR: 17 ANA: Negative ACE:  57 Anti-CCP:  <`16 RF:  <14 DS DNA AB:  <1 Smith AB:  <1 History of antibody: 2.1  RNP antibody: 1.9 Myositis antibody panel:  PM-scl 100 Positive SSA:  <1.0 SSB:  <1.0  04/18/16 CBC: 9.9/13.8/40.0/431 IgM: 129 IgA: 139 IgG: 587 IgE: <2 RAST panel: Negative  06/02/15 HGB:  15.3    Assessment & Plan:  66 y.o. female with chronic cough, multiple lung nodules, moderate persistent asthma, chronic seasonal allergic rhinitis, & GERD. Micronodule area within the lung tissue could be secondary to an early autoimmune process. Discussed this at length with the patient. Cough may be mildly better and as the patient previously discontinued Zoloft I question whether or not this could be coincidental. Overall her reflux is well controlled. Her allergic rhinitis may have been better controlled on Dymista and I will be refilling this medication today. I instructed the patient contacted me if she had any new breathing problems or questions before next appointment.  1. Chronic cough:  Slight improvement. Continuing to hold off on Zoloft. Awaiting rheumatology input on possible autoimmune process. 2. Multiple lung nodules:  Question if this could be secondary to underlying autoimmune process. Holding on surgical lung biopsy. Repeat spirometry with DLCO next appointment. Holding on repeat chest imaging at this time. 3. Moderate, persistent asthma:  Continuing albuterol inhaler as needed. 4. Chronic seasonal allergic rhinitis:  Prescribing Dymista in place of Flonase. 5. GERD:  Continuing current regimen. No changes. 6. Health maintenance: Status post Prevnar March 2018 & Pneumovax October 2014. Administering influenza vaccine today. 7. Follow-up: Return to clinic in 3 months or sooner if needed.  Creig Hines  Earney Hamburg, M.D. [facility] Pager:  [phone] After 3pm or if no response, call [phone] 3:12 PM 12/20/16

## 2017-01-17 DIAGNOSIS — N3010 Interstitial cystitis (chronic) without hematuria: Secondary | ICD-10-CM | POA: Diagnosis not present

## 2017-01-24 DIAGNOSIS — F4001 Agoraphobia with panic disorder: Secondary | ICD-10-CM | POA: Diagnosis not present

## 2017-01-25 ENCOUNTER — Telehealth: Payer: Self-pay | Admitting: Pulmonary Disease

## 2017-01-25 NOTE — Telephone Encounter (Signed)
While Zoloft can cause damage to the lungs it is generally a pattern different from her's. Additionally the previous bronchoscopy & cell counts don't support the type of injury that Zoloft can cause. However, if she wants to try to taper herself off the Zoloft to see if this has some effect it would be reasonable. She should discuss this with her prescribing physician.

## 2017-01-25 NOTE — Telephone Encounter (Signed)
lmomtcb x1 for pt 

## 2017-01-25 NOTE — Telephone Encounter (Signed)
Spoke with pt, she states she spoke with Dr. Clovis Pu and states Zoloft can  possibly cause pulmonary fibrosis. Dr. Clovis Pu wanted her to mention it to Breese to see what he thinks because he wants her to continue it but wants to make sure it is ok with JN. She stated this may have caused her lung issues. Should she continue on Zoloft or do you think this has nothing to do with her lung issues or any issued that will progress in the future? Please advise.

## 2017-01-25 NOTE — Telephone Encounter (Signed)
Pt is aware of JN's recommendations and voiced her understanding. Pt states she stopped taken Zoloft about 2 months ago. Pt states she discussed this with prescribing provider yesterday. Nothing further needed.

## 2017-01-25 NOTE — Telephone Encounter (Signed)
Patient is returning phone call.  °

## 2017-02-18 DIAGNOSIS — R351 Nocturia: Secondary | ICD-10-CM | POA: Diagnosis not present

## 2017-02-18 DIAGNOSIS — Z6827 Body mass index (BMI) 27.0-27.9, adult: Secondary | ICD-10-CM | POA: Diagnosis not present

## 2017-02-18 DIAGNOSIS — Z01419 Encounter for gynecological examination (general) (routine) without abnormal findings: Secondary | ICD-10-CM | POA: Diagnosis not present

## 2017-02-18 DIAGNOSIS — R350 Frequency of micturition: Secondary | ICD-10-CM | POA: Diagnosis not present

## 2017-02-18 DIAGNOSIS — R102 Pelvic and perineal pain: Secondary | ICD-10-CM | POA: Diagnosis not present

## 2017-02-21 DIAGNOSIS — N3010 Interstitial cystitis (chronic) without hematuria: Secondary | ICD-10-CM | POA: Diagnosis not present

## 2017-02-26 DIAGNOSIS — D2271 Melanocytic nevi of right lower limb, including hip: Secondary | ICD-10-CM | POA: Diagnosis not present

## 2017-02-26 DIAGNOSIS — D1801 Hemangioma of skin and subcutaneous tissue: Secondary | ICD-10-CM | POA: Diagnosis not present

## 2017-02-26 DIAGNOSIS — L84 Corns and callosities: Secondary | ICD-10-CM | POA: Diagnosis not present

## 2017-02-26 DIAGNOSIS — D2262 Melanocytic nevi of left upper limb, including shoulder: Secondary | ICD-10-CM | POA: Diagnosis not present

## 2017-02-26 DIAGNOSIS — L821 Other seborrheic keratosis: Secondary | ICD-10-CM | POA: Diagnosis not present

## 2017-02-26 DIAGNOSIS — D2272 Melanocytic nevi of left lower limb, including hip: Secondary | ICD-10-CM | POA: Diagnosis not present

## 2017-02-26 DIAGNOSIS — D2261 Melanocytic nevi of right upper limb, including shoulder: Secondary | ICD-10-CM | POA: Diagnosis not present

## 2017-02-26 DIAGNOSIS — D225 Melanocytic nevi of trunk: Secondary | ICD-10-CM | POA: Diagnosis not present

## 2017-03-07 DIAGNOSIS — H43813 Vitreous degeneration, bilateral: Secondary | ICD-10-CM | POA: Diagnosis not present

## 2017-03-07 DIAGNOSIS — H524 Presbyopia: Secondary | ICD-10-CM | POA: Diagnosis not present

## 2017-03-07 DIAGNOSIS — H04123 Dry eye syndrome of bilateral lacrimal glands: Secondary | ICD-10-CM | POA: Diagnosis not present

## 2017-03-07 DIAGNOSIS — Z961 Presence of intraocular lens: Secondary | ICD-10-CM | POA: Diagnosis not present

## 2017-03-14 ENCOUNTER — Ambulatory Visit (INDEPENDENT_AMBULATORY_CARE_PROVIDER_SITE_OTHER): Payer: PPO | Admitting: Pulmonary Disease

## 2017-03-14 ENCOUNTER — Ambulatory Visit: Payer: PPO | Admitting: Pulmonary Disease

## 2017-03-14 ENCOUNTER — Encounter: Payer: Self-pay | Admitting: Pulmonary Disease

## 2017-03-14 VITALS — BP 116/78 | HR 85 | Ht 60.0 in | Wt 144.0 lb

## 2017-03-14 DIAGNOSIS — R053 Chronic cough: Secondary | ICD-10-CM

## 2017-03-14 DIAGNOSIS — J4540 Moderate persistent asthma, uncomplicated: Secondary | ICD-10-CM | POA: Diagnosis not present

## 2017-03-14 DIAGNOSIS — R918 Other nonspecific abnormal finding of lung field: Secondary | ICD-10-CM | POA: Diagnosis not present

## 2017-03-14 DIAGNOSIS — R05 Cough: Secondary | ICD-10-CM

## 2017-03-14 DIAGNOSIS — K219 Gastro-esophageal reflux disease without esophagitis: Secondary | ICD-10-CM

## 2017-03-14 LAB — PULMONARY FUNCTION TEST
DL/VA % pred: 89 %
DL/VA: 3.8 ml/min/mmHg/L
DLCO cor % pred: 72 %
DLCO cor: 13.7 ml/min/mmHg
DLCO unc % pred: 74 %
DLCO unc: 14.03 ml/min/mmHg
FEF 25-75 Pre: 2.61 L/sec
FEF2575-%Pred-Pre: 137 %
FEV1-%Pred-Pre: 95 %
FEV1-Pre: 1.94 L
FEV1FVC-%Pred-Pre: 111 %
FEV6-%Pred-Pre: 88 %
FEV6-Pre: 2.26 L
FEV6FVC-%Pred-Pre: 104 %
FVC-%Pred-Pre: 84 %
FVC-Pre: 2.27 L
Pre FEV1/FVC ratio: 86 %
Pre FEV6/FVC Ratio: 100 %

## 2017-03-14 MED ORDER — AZELASTINE-FLUTICASONE 137-50 MCG/ACT NA SUSP: 1.0000 | Freq: Every day | NASAL | 0 refills | Status: DC

## 2017-03-14 NOTE — Progress Notes (Signed)
PFT done today. 

## 2017-03-14 NOTE — Progress Notes (Signed)
Subjective:    Patient ID: Brenda [name], female    DOB: 12/04/1950, 66 y.o.   MRN: [id number]  C.C.:  Follow-up for Chronic Cough, Multiple Lung Nodules, Moderate, Persistent Asthma, Chronic Seasonal Allergic Rhinitis, & Mild Restrictive Lung Disease.   HPI  Chronic cough: Previous cultures from bronchoscopy negative. At last appointment patient's cough was improving slightly. Previously stopped Zoloft with the concern that it could be causing lung injury. She reports her cough is still fluctuating. She reports she has had clear mucus.   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 Previously Referred to Rheumatology. Deferred Surgical Lung Biopsy at last Appointment. Previously stopped Zoloft with the concern that it could be causing lung injury.  Moderate, persistent asthma: Previously on Singulair. Has also tried Breo as well as Symbicort without symptomatic relief in her cough.  Chronic seasonal allergic rhinitis: Prescribed Singulair. Switched back to Dymista from [place] at last appointment to the increased sinus congestion. She has noticed increased sinus congestion & drainage lately of clear mucus. She has had some scabbing & dryness in her nose lately as well.  GERD: Followed by GI. Prescribed Protonix and Zantac. No reflux or dyspepsia.  Review of Systems No chest pain or pressure except with coughing. No rashes or bruising. No fever chills. Still has her hot flashes after going off her hormone replacement.   Allergies  Allergen Reactions  . Latex Rash  . Adhesive [Tape] Other (See Comments)    TEARS SKIN; PLEASE USE COBAN WRAP!!  . Bupivacaine Other (See Comments)    Other Reaction: PRE-SYNCOPE, TACTYCARDIA TACHYCARDIA/ PRESYNCOPE  . Butorphanol Tartrate Other (See Comments)    NUMBNESS, TACHYCARDIA  . Clarithromycin Nausea Only and Other (See Comments)    TACHYCARDIA  . Codeine Other (See Comments)    SEVERE ELEVATED LIVER ENZYMES  .  Cymbalta [Duloxetine Hcl] Other (See Comments)    Blurred vision  . Dilaudid [Hydromorphone Hcl] Itching    SEVERE ITCHING  . Elavil [Amitriptyline Hcl] Other (See Comments)    Elevated liver enzymes  . Hydrocodone Other (See Comments)    Elevated liver enzymes  . Hyoscyamine Sulfate Other (See Comments)    Dizziness, nausea, headache  . Lactated Ringers Other (See Comments)    COULD CAUSE PANIC ATTACK  . Levbid [Hyoscyamine Sulfate] Other (See Comments)    Dizziness, nausea, headache  . Lisinopril Cough    Dry cough  . Marcaine [Bupivacaine Hcl] Other (See Comments)    TACHYCARDIA, PRESYNCOPE  . Pentazocine Lactate Other (See Comments)    Tachycardia   . Percodan [Oxycodone-Aspirin] Nausea Only  . Stadol [Butorphanol Tartrate] Swelling    Lips numb, tongue swell, tachycardia  . Trazodone And Nefazodone Other (See Comments)    Tachycardia   . Vistaril [Hydroxyzine Hcl] Other (See Comments)    Altered sensorium  . Morphine And Related Itching and Rash    Current Outpatient Medications on File Prior to Visit  Medication Sig Dispense Refill  . acetaminophen (TYLENOL) 325 MG tablet Take 325 mg by mouth every 6 (six) hours as needed (for headaches).    Marland Kitchen albuterol (VENTOLIN HFA) 108 (90 Base) MCG/ACT inhaler Inhale into the lungs every 6 (six) hours as needed for wheezing or shortness of breath.    . Azelastine-Fluticasone (DYMISTA) 137-50 MCG/ACT SUSP Place 1 spray into the nose daily. 1 Bottle 3  . Calcium Carbonate-Vitamin D (CALCIUM + D PO) Take 1 tablet by mouth daily.     . Coenzyme Q10 (CO  Q-10 PO) Take 1 capsule by mouth 2 (two) times a week.    . ezetimibe (ZETIA) 10 MG tablet Take 10 mg by mouth daily.     . fluticasone (FLONASE ALLERGY RELIEF) 50 MCG/ACT nasal spray Place 1 spray into both nostrils as needed.     . folic acid (FOLVITE) 1 MG tablet Take 1 mg by mouth daily.    Marland Kitchen LORazepam (ATIVAN) 1 MG tablet Take 1 mg by mouth every 8 (eight) hours as needed for  anxiety.     . metoprolol (LOPRESSOR) 50 MG tablet Take 25 mg by mouth 2 (two) times daily.     . montelukast (SINGULAIR) 10 MG tablet Take 10 mg by mouth daily.     . Multiple Vitamin (MULTIVITAMIN) tablet Take 1 tablet by mouth daily.    Marland Kitchen olmesartan (BENICAR) 20 MG tablet Take 20 mg by mouth daily.    . Omega-3 Fatty Acids (FISH OIL PO) Take 1 capsule by mouth 2 (two) times a week.    . pantoprazole (PROTONIX) 20 MG tablet Take 20 mg by mouth daily.    . ranitidine (ZANTAC) 150 MG tablet Take 1 tablet (150 mg total) by mouth at bedtime. (Patient taking differently: Take 150 mg by mouth daily. ) 30 tablet 3  . Vitamin D, Ergocalciferol, (DRISDOL) 50000 UNITS CAPS Take 50,000 Units by mouth every 7 (seven) days.       No current facility-administered medications on file prior to visit.     Past Medical History:  Diagnosis Date  . Anal fissure   . Anemia   . Anxiety   . Arthritis   . Bladder pain   . Chronic seasonal allergic rhinitis   . Complication of anesthesia PAIN ATTACKS   LACTATED RINGERS- CONTRAINDICATED  . Depression   . Fibromyalgia   . Frequency of urination   . GERD (gastroesophageal reflux disease)   . Heart murmur   . History of pancreatitis 1988-1989  . Hyperlipidemia   . Hypertension LABILE  . IBS (irritable bowel syndrome)   . Interstitial cystitis   . Migraines   . Moderate persistent asthma    PULMOLOIGST-  DR Neyah Ellerman ([place])  . Multiple pulmonary nodules   . Normal cardiac stress test JULY 2009---  NORMAL  . 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 Spasm of sphincter of Oddi 1987  . Urethral stenosis S/P DILATIONS  . Urgency of urination     Past Surgical History:  Procedure Laterality Date  . ABDOMINAL HYSTERECTOMY  1988  . APPENDECTOMY  04/ 1986  . bilateral reduction mastopexy  1988  . bilateral turbinate resection  1990   nasal spreading graft  . CATARACT EXTRACTION  W/ INTRAOCULAR LENS  IMPLANT, BILATERAL  08 and 09/ 2017  . CHOLECYSTECTOMY  04/ 1986  . COLONOSCOPY  02/2004, 11/2010   2005: Normal - Dr. Sammuel Cooper 2012: Normal  . CYSTO WITH HYDRODISTENSION N/A 12/17/2013   Procedure: CYSTO HYDRODISTENSION WITH INSTALLATION OF CHLOROPACTIN AND TETRACAINE;  Surgeon: Malka So, MD;  Location: [facility];  Service: Urology;  Laterality: N/A;  . CYSTO WITH HYDRODISTENSION N/A 06/02/2015   Procedure: CYSTOSCOPY/HYDRODISTENSION WITH INSTILLATION OF CLORPACTIN ;  Surgeon: [name], MD;  Location: [facility];  Service: Urology;  Laterality: N/A;  . CYSTO WITH HYDRODISTENSION N/A 11/08/2016   Procedure: CYSTOSCOPY/HYDRODISTENSION INSTILLATION OF CLORPACTIN;  Surgeon: Jeffie Pollock,  Jenny Reichmann, MD;  Location: [facility];  Service: Urology;  Laterality: N/A;  . CYSTOSCOPY WITH URETHRAL DILATATION  07/12/2011   Procedure: CYSTOSCOPY WITH URETHRAL DILATATION;  Surgeon: Malka So, MD;  Location: [facility];  Service: Urology;  Laterality: N/A;  HOD AND INSTILLATION OF CHLOROPACTIN C-ARM   . CYSTOSCOPY WITH URETHRAL DILATATION N/A 06/26/2012   Procedure: CYSTOSCOPY WITH URETHRAL DILATATION HYDRODISTENSION AND INSTILLATION OF CLORPACTION ;  Surgeon: Malka So, MD;  Location: [facility];  Service: Urology;  Laterality: N/A;  CYSTOSCOPY WITH URETHRAL DILATATION HYDRODISTENSION AND INSTILLATION OF CLORPACTION   . CYSTOSTOMY W/ BLADDER BIOPSY  1983  . DILATION AND CURETTAGE OF UTERUS  1987  . ERCP  1988   for pancreatitis with stents   S/P SEVERE PANCREATITIS  . HEMORRHOID SURGERY    . LASIK Bilateral APRIL 2005  . MULTIPLE CYSTO/ HOD/ URETHRAL DILATION/ INSTILLATION CLORPACTIN  .LAST ONE 03-31-2010  . NASAL SINUS SURGERY  1977   sinus cyst  . REDUCTION MAMMAPLASTY Bilateral 1988  . revision rhinoplasty  1978  . RHINOPLASTY  1977  . RIGHT SALPINGOOPHECTOMY  1998  . TONSILLECTOMY AND ADENOIDECTOMY  1963    . VIDEO BRONCHOSCOPY Bilateral 08/23/2016   Procedure: VIDEO BRONCHOSCOPY WITHOUT FLUORO;  Surgeon: Javier Glazier, MD;  Location: [facility];  Service: Cardiopulmonary;  Laterality: Bilateral;    Family History  Problem Relation Age of Onset  . Colon cancer Maternal Grandmother   . Colon cancer Cousin   . Allergies Mother   . Asthma Mother   . Heart disease Mother   . Osteoarthritis Mother   . Diabetes Mother   . Irritable bowel syndrome Mother   . Brain cancer Father   . Osteoarthritis Father   . Colon polyps Father        adenomatous  . Heart disease Father   . Melanoma Sister   . Osteoarthritis Sister   . Irritable bowel syndrome Sister   . Diabetes Sister   . Heart disease Sister   . Stroke Sister   . Ulcerative colitis Maternal Uncle   . Stomach cancer Neg Hx   . Rheumatologic disease Neg Hx     Social History   Socioeconomic History  . Marital status: Married    Spouse name: None  . Number of children: 0  . Years of education: None  . Highest education level: None  Social Needs  . Financial resource strain: None  . Food insecurity - worry: None  . Food insecurity - inability: None  . Transportation needs - medical: None  . Transportation needs - non-medical: None  Occupational History  . Occupation: Therapist, sports  Tobacco Use  . Smoking status: Passive Smoke Exposure - Never Smoker  . Smokeless tobacco: Never Used  . Tobacco comment: Father smoked briefly  Substance and Sexual Activity  . Alcohol use: No  . Drug use: No  . Sexual activity: None  Other Topics Concern  . None  Social History Narrative   Married, lives with spouse   No children   RN at [employer]   No recent travel      [facility]:   Originally from [place]. Always lived in [place]. Previously has traveled to [place], [place], [place], [place], & mostly [place]. No indoor pets currently. She does have cats in her garage. No bird, mold, or hot tub exposure. No indoor plants. Mostly  wood floors. She did have her bedroom carpet taken up in Summer  2017. Has mostly blinds. No wood burning fire place. Previously enjoyed Firefighter & playing piano.       Objective:   Physical Exam BP 116/78 (BP Location: Right Arm, Cuff Size: Normal)   Pulse 85   Ht 5' (1.524 m)   Wt 144 lb (65.3 kg)   SpO2 97%   BMI 28.12 kg/m   General:  Awake. No distress. Comfortable.  Integument:  No rash. Warm. Dry. Extremities:  No cyanosis or clubbing.  HEENT:  Mild bilateral nasal turbinate swelling. No scleral icterus. Moist mucous membranes Cardiovascular:  Regular rate. No edema. Regular rhythm.  Pulmonary:  Clear bilaterally with auscultation. Normal work of breathing on room air. Abdomen: Soft. Normal bowel sounds. Nondistended.  Musculoskeletal:  Normal bulk and tone.No joint deformity or effusion appreciated. Neurological:  Cranial nerves 2-12 grossly in tact. No meningismus. Moving all 4 extremities equally.   PFT 03/14/17: FVC 2.27 L (84%) FEV1 1.94 L (95%) FEV1/FVC 0.86 FEF 25-75 2.61 L (137%)                                                                                                             DLCO corrected 72% 04/18/16: FVC 2.06 L (76%) FEV1 1.84 L (80%) FEV1/FVC 0.89 FEF 25-75 3.03 L (156%) negative bronchodilator response TLC 43 L (108%) RV 139% ERV 25% DLCO uncorrected 67% 10/20/13: FVC 2.14 L (86%) FEV1 1.98 L (92%) FEV1/FVC 0.82 FEF 25-75 2.14 L (104%) negative bronchodilator response TLC 3.73 L (83%) RV 70% ERV 65% DLCO uncorrected 92%  IMAGING CT CHEST W/O 07/17/16 (previously reviewed by me): Upper lobe predominant centrilobular groundglass micronodules. He with minimal subpleural reticulation. Seems to have progressed somewhat. No pleural effusion or thickening. No pericardial effusion. No pathologic mediastinal adenopathy.  CXR PA/LAT 06/07/16 (previously reviewed by me):  No focal opacity or mass appreciated. No pleural effusion. Heart normal in size & mediastinum  normal in contour.  MAXILLOFACIAL CT LTD W/O 04/19/16 (per radiologist): Visualized paranasal sinuses are essentially clear.  BARIUM SWALLOW/ESOPHAGRAM 04/16/16 (per radiologist):  Mild gastroesophageal reflux. No stricture.   CT CHEST W/O 03/28/15 (previously reviewed by me):  No parenchymal nodule or opacity appreciated. No pleural effusion or thickening. No pericardial effusion. No pathologic mediastinal adenopathy.  MICROBIOLOGY Sputum Culture (09/13/16):  Normal Respiratory Flora / AFB negative / Fungus negative  Right Middle Lobe BAL (08/23/16):  Normal Respiratory Flora / AFB negative / Fungus negative / Aspergillus Antigen 0.05 Right Upper Lobe BAL (08/23/16):  Normal Respiratory Flora / AFB negative / Fungus negative Sputum Culture (06/14/16):  Normal Oral Flora / AFB negative / Scant growth Yeast without further growth Sputum Culture (04/19/16):  Scant Yeast not identified / AFB negative / Normal Oral Flora Sputum Culture (05/04/16):  Normal Oral Flora  LABS 09/13/16 CRP: 0.2 ESR: 17 ANA: Negative ACE:  57 Anti-CCP:  <16 RF:  <14 DS DNA AB:  <1 Smith AB:  <1 History of antibody: 2.1  RNP antibody: 1.9 Myositis antibody panel:  PM-scl 100 Positive SSA:  <1.0 SSB:  <1.0  04/18/16 CBC: 9.9/13.8/40.0/431 IgM: 129 IgA: 139 IgG: 587 IgE: <2 RAST panel: Negative  06/02/15 HGB:  15.3    Assessment & Plan:  66 y.o. female with chronic cough as well as what seems to be moderate, persistent asthma. Cough seems to be coming more from her seasonal allergic rhinitis today. Her spirometry has improved since previous testing which is encouraging and could indicate that her previous lung disease/lung nodules were eats secondary to Zoloft. My review of literature does not indicate her previous pattern on CT imaging as one that would be consistent with lung induced injury from Zoloft. However, this is certainly still possible. Her reflux is under good control at this time. I instructed the  patient to contact my office if she had questions or concerns before her next appointment.  1. Chronic cough: Patient educated on cough suppression & to use hard candy/cough drops to thin secretions. Increasing treatment of allergic rhinitis. 2. Multiple lung nodules: Awaiting results/note from rheumatology visit. Deferring surgical lung biopsy. Repeat CT chest without contrast in March. 3. Moderate, persistent asthma: Continuing Singulair. No new medications. 4. Chronic seasonal allergic rhinitis: Given another sample of Dymista. 5. GERD: Continue Protonix and Zantac. No new medications. 6. Health maintenance: Status post Flu Vaccine August 2018, Prevnar March 2018 & Pneumovax October 2014.  7. Follow-up: Return to clinic in 5 months or sooner if needed.  Sonia Baller Ashok Cordia, M.D. [facility] Pager:  [phone] After 3pm or if no response, call [phone] 9:34 AM 03/14/17

## 2017-03-14 NOTE — Patient Instructions (Addendum)
   Continue using your medications as prescribed.  Try using the Dymista nasal spray in place of Flonase. Start with 1 spray in each nostril once daily.  Call my office if your sinus congestion & cough don't improve after a few days on the Dymista.  We will plan on seeing you back around the time of your next CT scan.  Try to use cough drops & hard candy to thin out the secretions in the back of your throat and decrease the need for throat clearing.  We will also let you know once I've reviewed the visit note from Rheumatology.    TESTS ORDERED: 1. CT CHEST W/O March 2019

## 2017-03-15 ENCOUNTER — Telehealth: Payer: Self-pay | Admitting: Pulmonary Disease

## 2017-03-15 NOTE — Telephone Encounter (Signed)
I reviewed the note from the patient's rheumatology evaluation. Her histone antibody was elevated but otherwise her workup serologically was negative. They did not feel she had an autoimmune process and agreed with repeating CT imaging in 6 months. Continuing with plan for noncontrast CT chest in March. Patient has never had a high-resolution CT scan and therefore I will be repeating a 5 mm cut noncontrast chest CT to rule out for more accurate comparison. I also phoned in a prescription for Dymista nasal spray to her pharmacy as she says this has helped her postnasal drainage and cough significantly more than Flonase.

## 2017-04-18 DIAGNOSIS — F4001 Agoraphobia with panic disorder: Secondary | ICD-10-CM | POA: Diagnosis not present

## 2017-04-26 DIAGNOSIS — H00011 Hordeolum externum right upper eyelid: Secondary | ICD-10-CM | POA: Diagnosis not present

## 2017-04-26 DIAGNOSIS — N3010 Interstitial cystitis (chronic) without hematuria: Secondary | ICD-10-CM | POA: Diagnosis not present

## 2017-05-01 ENCOUNTER — Telehealth: Payer: Self-pay | Admitting: Adult Health

## 2017-05-01 NOTE — Telephone Encounter (Signed)
PA request received by [facility] for [employer]. Initiated via [email or web address]- Key: [place] This has been sent to plan for review and a determination is expected within 1-5 business days.    Former [facility] pt not yet established with another provider- will route to [place] for follow up.

## 2017-05-02 NOTE — Telephone Encounter (Signed)
Checked Cover My Meds. PA has been approved through 04/29/2018.

## 2017-05-03 ENCOUNTER — Other Ambulatory Visit: Payer: Self-pay

## 2017-05-03 MED ORDER — RANITIDINE HCL 150 MG PO TABS: 150.0000 mg | ORAL_TABLET | Freq: Every day | ORAL | 3 refills | Status: DC

## 2017-05-06 ENCOUNTER — Telehealth: Payer: Self-pay | Admitting: Internal Medicine

## 2017-05-06 DIAGNOSIS — R0602 Shortness of breath: Secondary | ICD-10-CM

## 2017-05-06 NOTE — Telephone Encounter (Signed)
Fine with me but her insurance will probably deny it as they use the same criteria as radiology on lung nodules = fleischner guidelines  Which are quoted in her report to do agian at 12 months.  What I would rec is:    if she's concerned needs earlier eval then first  see one of [place] in office and if we sort this out and try to determine whether there is some clinical indication for repeat scan earlier than the guidelines

## 2017-05-06 NOTE — Telephone Encounter (Signed)
ATC pt, no answer. Left message for pt to call back.  

## 2017-05-06 NOTE — Telephone Encounter (Signed)
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 Melvyn Novas in April after CT scan.  Pt was to have a follow-up CT scan in April but is wishing to have it done now.  Dr. Melvyn Novas, please advise if you are okay for pt to have the CT now or if pt should wait until April as originally stated per Dr. Ashok Cordia.  Thanks!

## 2017-05-07 NOTE — Telephone Encounter (Signed)
lmtcb x1 for pt. 

## 2017-05-07 NOTE — Telephone Encounter (Signed)
Patient returned call, CB is [phone].

## 2017-05-08 DIAGNOSIS — R05 Cough: Secondary | ICD-10-CM | POA: Diagnosis not present

## 2017-05-08 DIAGNOSIS — J209 Acute bronchitis, unspecified: Secondary | ICD-10-CM | POA: Diagnosis not present

## 2017-05-08 DIAGNOSIS — J309 Allergic rhinitis, unspecified: Secondary | ICD-10-CM | POA: Diagnosis not present

## 2017-05-08 DIAGNOSIS — I10 Essential (primary) hypertension: Secondary | ICD-10-CM | POA: Diagnosis not present

## 2017-05-08 DIAGNOSIS — Z6827 Body mass index (BMI) 27.0-27.9, adult: Secondary | ICD-10-CM | POA: Diagnosis not present

## 2017-05-08 NOTE — Telephone Encounter (Signed)
Spoke with patient. She is still wanting to get the CT scan sooner without an appointment.   MW, are you ok with this? Please advise. Thanks!

## 2017-05-08 NOTE — Telephone Encounter (Signed)
Pt is calling back pt is working if there is a day and time please her know you can leave it on the vm [phone]

## 2017-05-08 NOTE — Telephone Encounter (Signed)
Left vm as requested below relaying MW's recs.  Ct ordered.  Nothing further needed at this time.

## 2017-05-08 NOTE — Telephone Encounter (Signed)
lmtcb x2 for pt. 

## 2017-05-08 NOTE — Telephone Encounter (Signed)
Fine with me if she's willing to accept her insurance may not cover it

## 2017-05-15 ENCOUNTER — Ambulatory Visit: Payer: Self-pay | Admitting: Internal Medicine

## 2017-05-16 DIAGNOSIS — E7849 Other hyperlipidemia: Secondary | ICD-10-CM | POA: Diagnosis not present

## 2017-05-16 DIAGNOSIS — R82998 Other abnormal findings in urine: Secondary | ICD-10-CM | POA: Diagnosis not present

## 2017-05-16 DIAGNOSIS — I10 Essential (primary) hypertension: Secondary | ICD-10-CM | POA: Diagnosis not present

## 2017-05-16 DIAGNOSIS — E559 Vitamin D deficiency, unspecified: Secondary | ICD-10-CM | POA: Diagnosis not present

## 2017-05-16 DIAGNOSIS — M859 Disorder of bone density and structure, unspecified: Secondary | ICD-10-CM | POA: Diagnosis not present

## 2017-05-16 DIAGNOSIS — R7301 Impaired fasting glucose: Secondary | ICD-10-CM | POA: Diagnosis not present

## 2017-05-20 ENCOUNTER — Encounter (INDEPENDENT_AMBULATORY_CARE_PROVIDER_SITE_OTHER): Payer: Self-pay | Admitting: Orthopaedic Surgery

## 2017-05-20 ENCOUNTER — Ambulatory Visit (INDEPENDENT_AMBULATORY_CARE_PROVIDER_SITE_OTHER): Payer: PPO | Admitting: Orthopaedic Surgery

## 2017-05-20 VITALS — BP 121/76 | HR 65 | Resp 16 | Ht 59.75 in | Wt 139.0 lb

## 2017-05-20 DIAGNOSIS — M1712 Unilateral primary osteoarthritis, left knee: Secondary | ICD-10-CM | POA: Insufficient documentation

## 2017-05-20 NOTE — Progress Notes (Deleted)
Office Visit Note   Patient: Brenda [name]           Date of Birth: [birth year]           MRN: [id number] Visit Date: 05/20/2017              Requested by: Prince Solian, MD [address] [place], [place] [address] PCP: Prince Solian, MD   Assessment & Plan: Visit Diagnoses: No diagnosis found.  Plan: ***  Follow-Up Instructions: No Follow-up on file.   Orders:  No orders of the defined types were placed in this encounter.  No orders of the defined types were placed in this encounter.     Procedures: No procedures performed   Clinical Data: No additional findings.   Subjective: Chief Complaint  Patient presents with  . Left Hand - Pain  . Knee Pain    Left knee pain x 1 week, pain from upper leg down to foot, draggin vaccum up stairs, swollen, aching, difficulty walking, difficulty sleeping, popping, cracking, wants inj., Tylenol doesn't help    HPI  Review of Systems  Constitutional: Positive for appetite change.  HENT: Negative for trouble swallowing.   Eyes: Negative for pain.  Respiratory: Negative for shortness of breath.   Cardiovascular: Negative for leg swelling.  Gastrointestinal: Negative for diarrhea.  Endocrine: Negative for heat intolerance.  Genitourinary: Negative for difficulty urinating.  Musculoskeletal: Positive for back pain, gait problem, joint swelling, neck pain and neck stiffness.  Skin: Negative for rash.  Allergic/Immunologic: Negative for food allergies.  Neurological: Positive for weakness and numbness.  Hematological: Does not bruise/bleed easily.  Psychiatric/Behavioral: Positive for sleep disturbance.     Objective: Vital Signs: BP 121/76 (BP Location: Right Arm, Patient Position: Sitting, Cuff Size: Normal)   Pulse 65   Resp 16   Ht 4' 11.75" (1.518 m)   Wt 139 lb (63 kg)   BMI 27.37 kg/m   Physical Exam  Ortho Exam  Specialty Comments:  No specialty comments available.  Imaging: No results  found.   PMFS History: Patient Active Problem List   Diagnosis Date Noted  . Persistent cough   . Moderate persistent asthma 03/19/2016  . Chronic seasonal allergic rhinitis 03/19/2016  . GERD (gastroesophageal reflux disease) 03/19/2016  . Cough 03/19/2016  . Essential hypertension, benign 03/19/2016  . H/O: CVA (cerebrovascular accident) 03/19/2016  . IBS (irritable bowel syndrome) 03/19/2016  . Interstitial cystitis 03/19/2016  . Fibromyalgia 03/19/2016  . DJD (degenerative joint disease) 03/19/2016  . Arthritis 03/19/2016  . Sphincter of Oddi dysfunction 03/19/2016  . H/O acute pancreatitis 03/19/2016   Past Medical History:  Diagnosis Date  . Anal fissure   . Anemia   . Anxiety   . Arthritis   . Bladder pain   . Chronic seasonal allergic rhinitis   . Complication of anesthesia PAIN ATTACKS   LACTATED RINGERS- CONTRAINDICATED  . Depression   . Fibromyalgia   . Frequency of urination   . GERD (gastroesophageal reflux disease)   . Heart murmur   . History of pancreatitis 1988-1989  . Hyperlipidemia   . Hypertension LABILE  . IBS (irritable bowel syndrome)   . Interstitial cystitis   . Migraines   . Moderate persistent asthma    PULMOLOIGST-  DR NESTOR ([place])  . Multiple pulmonary nodules   . Normal cardiac stress test JULY 2009---  NORMAL  . 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 Spasm of sphincter of Oddi 1987  . Urethral stenosis S/P DILATIONS  . Urgency of urination     Family History  Problem Relation Age of Onset  . Colon cancer Maternal Grandmother   . Colon cancer Cousin   . Allergies Mother   . Asthma Mother   . Heart disease Mother   . Osteoarthritis Mother   . Diabetes Mother   . Irritable bowel syndrome Mother   . Brain cancer Father   . Osteoarthritis Father   . Colon polyps Father        adenomatous  . Heart disease Father   . Melanoma Sister   .  Osteoarthritis Sister   . Irritable bowel syndrome Sister   . Diabetes Sister   . Heart disease Sister   . Stroke Sister   . Ulcerative colitis Maternal Uncle   . Stomach cancer Neg Hx   . Rheumatologic disease Neg Hx     Past Surgical History:  Procedure Laterality Date  . ABDOMINAL HYSTERECTOMY  1988  . APPENDECTOMY  04/ 1986  . bilateral reduction mastopexy  1988  . bilateral turbinate resection  1990   nasal spreading graft  . CATARACT EXTRACTION W/ INTRAOCULAR LENS  IMPLANT, BILATERAL  08 and 09/ 2017  . CHOLECYSTECTOMY  04/ 1986  . COLONOSCOPY  02/2004, 11/2010   2005: Normal - Dr. Sammuel Cooper 2012: Normal  . CYSTO WITH HYDRODISTENSION N/A 12/17/2013   Procedure: CYSTO HYDRODISTENSION WITH INSTALLATION OF CHLOROPACTIN AND TETRACAINE;  Surgeon: Malka So, MD;  Location[facility];  Service: Urology;  Laterality: N/A;  . CYSTO WITH HYDRODISTENSION N/A 06/02/2015   Procedure: CYSTOSCOPY/HYDRODISTENSION WITH INSTILLATION OF CLORPACTIN ;  Surgeon: [name], MD;  Location: [facility];  Service: Urology;  Laterality: N/A;  . CYSTO WITH HYDRODISTENSION N/A 11/08/2016   Procedure: CYSTOSCOPY/HYDRODISTENSION INSTILLATION OF CLORPACTIN;  Surgeon: [name], MD;  Location: [facility];  Service: Urology;  Laterality: N/A;  . CYSTOSCOPY WITH URETHRAL DILATATION  07/12/2011   Procedure: CYSTOSCOPY WITH URETHRAL DILATATION;  Surgeon: Malka So, MD;  Location: [facility];  Service: Urology;  Laterality: N/A;  HOD AND INSTILLATION OF CHLOROPACTIN C-ARM   . CYSTOSCOPY WITH URETHRAL DILATATION N/A 06/26/2012   Procedure: CYSTOSCOPY WITH URETHRAL DILATATION HYDRODISTENSION AND INSTILLATION OF CLORPACTION ;  Surgeon: Malka So, MD;  Location[facility];  Service: Urology;  Laterality: N/A;  CYSTOSCOPY WITH URETHRAL DILATATION HYDRODISTENSION AND INSTILLATION OF CLORPACTION   . CYSTOSTOMY W/ BLADDER BIOPSY  1983  .  DILATION AND CURETTAGE OF UTERUS  1987  . ERCP  1988   for pancreatitis with stents   S/P SEVERE PANCREATITIS  . HEMORRHOID SURGERY    . LASIK Bilateral APRIL 2005  . MULTIPLE CYSTO/ HOD/ URETHRAL DILATION/ INSTILLATION CLORPACTIN  .LAST ONE 03-31-2010  . NASAL SINUS SURGERY  1977   sinus cyst  . REDUCTION MAMMAPLASTY Bilateral 1988  . revision rhinoplasty  1978  . RHINOPLASTY  1977  . RIGHT SALPINGOOPHECTOMY  1998  . TONSILLECTOMY AND ADENOIDECTOMY  1963  . VIDEO BRONCHOSCOPY Bilateral 08/23/2016   Procedure: VIDEO BRONCHOSCOPY WITHOUT FLUORO;  Surgeon: Javier Glazier, MD;  Location: [facility];  Service: Cardiopulmonary;  Laterality: Bilateral;   Social History   Occupational History  . Occupation: Therapist, sports  Tobacco Use  . Smoking status: Passive Smoke Exposure - Never Smoker  . Smokeless tobacco: Never Used  . Tobacco comment:  Father smoked briefly  Substance and Sexual Activity  . Alcohol use: No  . Drug use: No  . Sexual activity: Not on file

## 2017-05-20 NOTE — Progress Notes (Signed)
Office Visit Note   Patient: Brenda [name]           Date of Birth: Oct 19, 1950           MRN: [id number] Visit Date: 05/20/2017              Requested by: Prince Solian, MD [address] [place], [place] [address] PCP: Prince Solian, MD   Assessment & Plan: Visit Diagnoses:  1. Unilateral primary osteoarthritis, left knee     Plan: Osteoarthritis left knee. We'll plan cortisone injection and monitor her response. If no improvement will consider repeat films  Follow-Up Instructions: Return if symptoms worsen or fail to improve.   Orders:  No orders of the defined types were placed in this encounter.  No orders of the defined types were placed in this encounter.     Procedures: No procedures performed   Clinical Data: No additional findings.   Subjective: Chief Complaint  Patient presents with  . Left Hand - Pain  . Knee Pain    Left knee pain x 1 week, pain from upper leg down to foot, draggin vaccum up stairs, swollen, aching, difficulty walking, difficulty sleeping, popping, cracking, wants inj., Tylenol doesn't help  Recurrent pain left knee for about a week without injury or trauma. Does spend a lot of time on her feet. Prior evaluations reveal some degenerative changes in all 3 compartments confirmed with an MRI scan in April of last year. Left knee is feeling a little achy and "sore"  HPI  Review of Systems   Objective: Vital Signs: BP 121/76 (BP Location: Right Arm, Patient Position: Sitting, Cuff Size: Normal)   Pulse 65   Resp 16   Ht 4' 11.75" (1.518 m)   Wt 139 lb (63 kg)   BMI 27.37 kg/m   Physical Exam  Ortho Exam awake alert and oriented 3 comfortable sitting. May be very minimal effusion left knee. Mostly lateral joint pain some patella crepitation. No instability. Full extension and flexion over 100 no calf pain neurovascular exam intact  Specialty Comments:  No specialty comments available.  Imaging: No results  found.   PMFS History: Patient Active Problem List   Diagnosis Date Noted  . Unilateral primary osteoarthritis, left knee 05/20/2017  . Persistent cough   . Moderate persistent asthma 03/19/2016  . Chronic seasonal allergic rhinitis 03/19/2016  . GERD (gastroesophageal reflux disease) 03/19/2016  . Cough 03/19/2016  . Essential hypertension, benign 03/19/2016  . H/O: CVA (cerebrovascular accident) 03/19/2016  . IBS (irritable bowel syndrome) 03/19/2016  . Interstitial cystitis 03/19/2016  . Fibromyalgia 03/19/2016  . DJD (degenerative joint disease) 03/19/2016  . Arthritis 03/19/2016  . Sphincter of Oddi dysfunction 03/19/2016  . H/O acute pancreatitis 03/19/2016   Past Medical History:  Diagnosis Date  . Anal fissure   . Anemia   . Anxiety   . Arthritis   . Bladder pain   . Chronic seasonal allergic rhinitis   . Complication of anesthesia PAIN ATTACKS   LACTATED RINGERS- CONTRAINDICATED  . Depression   . Fibromyalgia   . Frequency of urination   . GERD (gastroesophageal reflux disease)   . Heart murmur   . History of pancreatitis 1988-1989  . Hyperlipidemia   . Hypertension LABILE  . IBS (irritable bowel syndrome)   . Interstitial cystitis   . Migraines   . Moderate persistent asthma    PULMOLOIGST-  DR NESTOR ([place])  . Multiple pulmonary nodules   . Normal cardiac stress test Carlean Purl  2009---  NORMAL  . 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 Spasm of sphincter of Oddi 1987  . Urethral stenosis S/P DILATIONS  . Urgency of urination     Family History  Problem Relation Age of Onset  . Colon cancer Maternal Grandmother   . Colon cancer Cousin   . Allergies Mother   . Asthma Mother   . Heart disease Mother   . Osteoarthritis Mother   . Diabetes Mother   . Irritable bowel syndrome Mother   . Brain cancer Father   . Osteoarthritis Father   . Colon polyps Father        adenomatous   . Heart disease Father   . Melanoma Sister   . Osteoarthritis Sister   . Irritable bowel syndrome Sister   . Diabetes Sister   . Heart disease Sister   . Stroke Sister   . Ulcerative colitis Maternal Uncle   . Stomach cancer Neg Hx   . Rheumatologic disease Neg Hx     Past Surgical History:  Procedure Laterality Date  . ABDOMINAL HYSTERECTOMY  1988  . APPENDECTOMY  04/ 1986  . bilateral reduction mastopexy  1988  . bilateral turbinate resection  1990   nasal spreading graft  . CATARACT EXTRACTION W/ INTRAOCULAR LENS  IMPLANT, BILATERAL  08 and 09/ 2017  . CHOLECYSTECTOMY  04/ 1986  . COLONOSCOPY  02/2004, 11/2010   2005: Normal - Dr. Sammuel Cooper 2012: Normal  . CYSTO WITH HYDRODISTENSION N/A 12/17/2013   Procedure: CYSTO HYDRODISTENSION WITH INSTALLATION OF CHLOROPACTIN AND TETRACAINE;  Surgeon: Malka So, MD;  Location: [facility];  Service: Urology;  Laterality: N/A;  . CYSTO WITH HYDRODISTENSION N/A 06/02/2015   Procedure: CYSTOSCOPY/HYDRODISTENSION WITH INSTILLATION OF CLORPACTIN ;  Surgeon: [name], MD;  Location: [facility];  Service: Urology;  Laterality: N/A;  . CYSTO WITH HYDRODISTENSION N/A 11/08/2016   Procedure: CYSTOSCOPY/HYDRODISTENSION INSTILLATION OF CLORPACTIN;  Surgeon: [name], MD;  Location: [facility];  Service: Urology;  Laterality: N/A;  . CYSTOSCOPY WITH URETHRAL DILATATION  07/12/2011   Procedure: CYSTOSCOPY WITH URETHRAL DILATATION;  Surgeon: Malka So, MD;  Location: [facility];  Service: Urology;  Laterality: N/A;  HOD AND INSTILLATION OF CHLOROPACTIN C-ARM   . CYSTOSCOPY WITH URETHRAL DILATATION N/A 06/26/2012   Procedure: CYSTOSCOPY WITH URETHRAL DILATATION HYDRODISTENSION AND INSTILLATION OF CLORPACTION ;  Surgeon: Malka So, MD;  Location: [facility];  Service: Urology;  Laterality: N/A;  CYSTOSCOPY WITH URETHRAL DILATATION HYDRODISTENSION AND INSTILLATION OF  CLORPACTION   . CYSTOSTOMY W/ BLADDER BIOPSY  1983  . DILATION AND CURETTAGE OF UTERUS  1987  . ERCP  1988   for pancreatitis with stents   S/P SEVERE PANCREATITIS  . HEMORRHOID SURGERY    . LASIK Bilateral APRIL 2005  . MULTIPLE CYSTO/ HOD/ URETHRAL DILATION/ INSTILLATION CLORPACTIN  .LAST ONE 03-31-2010  . NASAL SINUS SURGERY  1977   sinus cyst  . REDUCTION MAMMAPLASTY Bilateral 1988  . revision rhinoplasty  1978  . RHINOPLASTY  1977  . RIGHT SALPINGOOPHECTOMY  1998  . TONSILLECTOMY AND ADENOIDECTOMY  1963  . VIDEO BRONCHOSCOPY Bilateral 08/23/2016   Procedure: VIDEO BRONCHOSCOPY WITHOUT FLUORO;  Surgeon: Javier Glazier, MD;  Location: [facility];  Service: Cardiopulmonary;  Laterality: Bilateral;   Social History   Occupational History  . Occupation:  RN  Tobacco Use  . Smoking status: Passive Smoke Exposure - Never Smoker  . Smokeless tobacco: Never Used  . Tobacco comment: Father smoked briefly  Substance and Sexual Activity  . Alcohol use: No  . Drug use: No  . Sexual activity: Not on file     Garald Balding, MD   Note - This record has been created using [employer].  Chart creation errors have been sought, but may not always  have been located. Such creation errors do not reflect on  the standard of medical care.

## 2017-05-22 ENCOUNTER — Other Ambulatory Visit: Payer: Self-pay | Admitting: Internal Medicine

## 2017-05-22 DIAGNOSIS — Z139 Encounter for screening, unspecified: Secondary | ICD-10-CM

## 2017-05-23 DIAGNOSIS — E7849 Other hyperlipidemia: Secondary | ICD-10-CM | POA: Diagnosis not present

## 2017-05-23 DIAGNOSIS — R7301 Impaired fasting glucose: Secondary | ICD-10-CM | POA: Diagnosis not present

## 2017-05-23 DIAGNOSIS — N3010 Interstitial cystitis (chronic) without hematuria: Secondary | ICD-10-CM | POA: Diagnosis not present

## 2017-05-23 DIAGNOSIS — Z6828 Body mass index (BMI) 28.0-28.9, adult: Secondary | ICD-10-CM | POA: Diagnosis not present

## 2017-05-23 DIAGNOSIS — R002 Palpitations: Secondary | ICD-10-CM | POA: Diagnosis not present

## 2017-05-23 DIAGNOSIS — Z0000 Encounter for general adult medical examination without abnormal findings: Secondary | ICD-10-CM | POA: Diagnosis not present

## 2017-05-23 DIAGNOSIS — M859 Disorder of bone density and structure, unspecified: Secondary | ICD-10-CM | POA: Diagnosis not present

## 2017-05-23 DIAGNOSIS — K219 Gastro-esophageal reflux disease without esophagitis: Secondary | ICD-10-CM | POA: Diagnosis not present

## 2017-05-23 DIAGNOSIS — I10 Essential (primary) hypertension: Secondary | ICD-10-CM | POA: Diagnosis not present

## 2017-05-23 DIAGNOSIS — R911 Solitary pulmonary nodule: Secondary | ICD-10-CM | POA: Diagnosis not present

## 2017-05-23 DIAGNOSIS — Z1389 Encounter for screening for other disorder: Secondary | ICD-10-CM | POA: Diagnosis not present

## 2017-05-23 DIAGNOSIS — F39 Unspecified mood [affective] disorder: Secondary | ICD-10-CM | POA: Diagnosis not present

## 2017-05-24 DIAGNOSIS — N3010 Interstitial cystitis (chronic) without hematuria: Secondary | ICD-10-CM | POA: Diagnosis not present

## 2017-05-28 DIAGNOSIS — N3010 Interstitial cystitis (chronic) without hematuria: Secondary | ICD-10-CM | POA: Diagnosis not present

## 2017-05-28 DIAGNOSIS — R350 Frequency of micturition: Secondary | ICD-10-CM | POA: Diagnosis not present

## 2017-06-05 DIAGNOSIS — M6281 Muscle weakness (generalized): Secondary | ICD-10-CM | POA: Diagnosis not present

## 2017-06-05 DIAGNOSIS — N3010 Interstitial cystitis (chronic) without hematuria: Secondary | ICD-10-CM | POA: Diagnosis not present

## 2017-06-05 DIAGNOSIS — R102 Pelvic and perineal pain: Secondary | ICD-10-CM | POA: Diagnosis not present

## 2017-06-05 DIAGNOSIS — M62838 Other muscle spasm: Secondary | ICD-10-CM | POA: Diagnosis not present

## 2017-06-06 DIAGNOSIS — R350 Frequency of micturition: Secondary | ICD-10-CM | POA: Diagnosis not present

## 2017-06-13 DIAGNOSIS — R350 Frequency of micturition: Secondary | ICD-10-CM | POA: Diagnosis not present

## 2017-06-19 DIAGNOSIS — R351 Nocturia: Secondary | ICD-10-CM | POA: Diagnosis not present

## 2017-06-19 DIAGNOSIS — M6281 Muscle weakness (generalized): Secondary | ICD-10-CM | POA: Diagnosis not present

## 2017-06-19 DIAGNOSIS — R350 Frequency of micturition: Secondary | ICD-10-CM | POA: Diagnosis not present

## 2017-06-19 DIAGNOSIS — M62838 Other muscle spasm: Secondary | ICD-10-CM | POA: Diagnosis not present

## 2017-06-19 DIAGNOSIS — N3010 Interstitial cystitis (chronic) without hematuria: Secondary | ICD-10-CM | POA: Diagnosis not present

## 2017-06-20 ENCOUNTER — Ambulatory Visit
Admission: RE | Admit: 2017-06-20 | Discharge: 2017-06-20 | Disposition: A | Discharge status: Home / Self Care | Payer: PPO | Source: Ambulatory Visit | Attending: Internal Medicine | Admitting: Internal Medicine

## 2017-06-20 DIAGNOSIS — Z139 Encounter for screening, unspecified: Secondary | ICD-10-CM

## 2017-06-20 DIAGNOSIS — Z1231 Encounter for screening mammogram for malignant neoplasm of breast: Secondary | ICD-10-CM | POA: Diagnosis not present

## 2017-06-21 DIAGNOSIS — R350 Frequency of micturition: Secondary | ICD-10-CM | POA: Diagnosis not present

## 2017-06-21 DIAGNOSIS — N3010 Interstitial cystitis (chronic) without hematuria: Secondary | ICD-10-CM | POA: Diagnosis not present

## 2017-06-27 DIAGNOSIS — R350 Frequency of micturition: Secondary | ICD-10-CM | POA: Diagnosis not present

## 2017-07-01 ENCOUNTER — Encounter (INDEPENDENT_AMBULATORY_CARE_PROVIDER_SITE_OTHER): Payer: Self-pay | Admitting: Orthopaedic Surgery

## 2017-07-01 ENCOUNTER — Ambulatory Visit (INDEPENDENT_AMBULATORY_CARE_PROVIDER_SITE_OTHER): Payer: PPO | Admitting: Orthopaedic Surgery

## 2017-07-01 VITALS — BP 95/66 | HR 72 | Resp 14 | Ht 59.0 in | Wt 140.0 lb

## 2017-07-01 DIAGNOSIS — M25562 Pain in left knee: Secondary | ICD-10-CM

## 2017-07-01 DIAGNOSIS — G8929 Other chronic pain: Secondary | ICD-10-CM

## 2017-07-01 MED ORDER — METHYLPREDNISOLONE ACETATE 40 MG/ML IJ SUSP
80.0000 mg | INTRAMUSCULAR | Status: AC | PRN
  Administered 2017-07-01: 80 mg

## 2017-07-01 MED ORDER — LIDOCAINE HCL 1 % IJ SOLN
2.0000 mL | INTRAMUSCULAR | Status: AC | PRN
  Administered 2017-07-01: 2 mL

## 2017-07-01 MED ORDER — BUPIVACAINE HCL 0.5 % IJ SOLN
2.0000 mL | INTRAMUSCULAR | Status: AC | PRN
  Administered 2017-07-01: 2 mL via INTRA_ARTICULAR

## 2017-07-01 NOTE — Progress Notes (Signed)
Office Visit Note   Patient: Brenda [name]           Date of Birth: 25-Feb-1951           MRN: [id number] Visit Date: 07/01/2017              Requested by: Brenda Solian, MD [address] [place], [place] [address] PCP: Brenda Solian, MD   Assessment & Plan: Visit Diagnoses:  1. Chronic pain of left knee     Plan: Prior workmen's comp injury as previously identified. Brenda [name] think she has settled with the insurance company. Prior MRI scan demonstrated some tricompartmental degenerative arthritis with a possible partial tear of the MCL and cruciate ligaments. Has done well until recently when she developed some pain along the anterior medial aspect of the knee and wanted to have cortisone injection hasn't helped in the past. Will inject today and monitor her response  Follow-Up Instructions: Return if symptoms worsen or fail to improve.   Orders:  Orders Placed This Encounter  Procedures  . Large Joint Inj: L knee   No orders of the defined types were placed in this encounter.     Procedures: Large Joint Inj: L knee on 07/01/2017 11:51 AM Indications: pain and diagnostic evaluation Details: 25 G 1.5 in needle, anteromedial approach  Arthrogram: No  Medications: 2 mL lidocaine 1 %; 2 mL bupivacaine 0.5 %; 80 mg methylPREDNISolone acetate 40 MG/ML Procedure, treatment alternatives, risks and benefits explained, specific risks discussed. Consent was given by the patient. Patient was prepped and draped in the usual sterile fashion.       Clinical Data: No additional findings.   Subjective: Chief Complaint  Patient presents with  . Left Knee - Pain    Brenda [name] comes in with l knee pain, has had 1 inj in left knee. Still having some pain in the knee.  Has had a prior workmen's comp injury to her left knee as previously identified. MRI scan reveals some tricompartmental degenerative changes with a partial tear of the MCL and cruciate ligaments. Has settled with  the insurance company but has had some recurrent pain along the anterior medial aspect of her knee without recurrent injury or trauma. No sensation of her knee giving way. Case we will experience some swelling  HPI  Review of Systems  Constitutional: Negative for fatigue and fever.  HENT: Negative for ear pain and tinnitus.   Eyes: Negative for pain.  Respiratory: Negative for cough and shortness of breath.   Cardiovascular: Positive for palpitations. Negative for chest pain and leg swelling.  Gastrointestinal: Positive for blood in stool, constipation and diarrhea.  Genitourinary: Negative for dysuria.  Musculoskeletal: Positive for back pain, joint swelling, myalgias, neck pain and neck stiffness.  Skin: Negative for rash and wound.  Allergic/Immunologic: Negative for food allergies.  Neurological: Positive for numbness. Negative for dizziness, weakness, light-headedness and headaches.  Hematological: Does not bruise/bleed easily.  Psychiatric/Behavioral: Positive for sleep disturbance. The patient is not nervous/anxious.      Objective: Vital Signs: BP 95/66 (BP Location: Left Arm, Patient Position: Sitting, Cuff Size: Normal)   Pulse 72   Resp 14   Ht 4\' 11"  (1.499 m)   Wt 140 lb (63.5 kg)   BMI 28.28 kg/m   Physical Exam  Ortho Exam left knee without effusion. Pain mostly along the anteromedial aspect of the knee. Mild patella crepitation. No instability. No calf pain or popliteal mass. Neurovascular exam intact distally. No distal edema. Full  knee extension and flexion over 105 on the MCL. Right leg raise negative. No pain with range of motion of either hip  Specialty Comments:  No specialty comments available.  Imaging: No results found.   PMFS History: Patient Active Problem List   Diagnosis Date Noted  . Unilateral primary osteoarthritis, left knee 05/20/2017  . Persistent cough   . Moderate persistent asthma 03/19/2016  . Chronic seasonal allergic rhinitis  03/19/2016  . GERD (gastroesophageal reflux disease) 03/19/2016  . Cough 03/19/2016  . Essential hypertension, benign 03/19/2016  . H/O: CVA (cerebrovascular accident) 03/19/2016  . IBS (irritable bowel syndrome) 03/19/2016  . Interstitial cystitis 03/19/2016  . Fibromyalgia 03/19/2016  . DJD (degenerative joint disease) 03/19/2016  . Arthritis 03/19/2016  . Sphincter of Oddi dysfunction 03/19/2016  . H/O acute pancreatitis 03/19/2016   Past Medical History:  Diagnosis Date  . Anal fissure   . Anemia   . Anxiety   . Arthritis   . Bladder pain   . Chronic seasonal allergic rhinitis   . Complication of anesthesia PAIN ATTACKS   LACTATED RINGERS- CONTRAINDICATED  . Depression   . Fibromyalgia   . Frequency of urination   . GERD (gastroesophageal reflux disease)   . Heart murmur   . History of pancreatitis 1988-1989  . Hyperlipidemia   . Hypertension LABILE  . IBS (irritable bowel syndrome)   . Interstitial cystitis   . Migraines   . Moderate persistent asthma    PULMOLOIGST-  DR NESTOR ([place])  . Multiple pulmonary nodules   . Normal cardiac stress test JULY 2009---  NORMAL  . 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 Spasm of sphincter of Oddi 1987  . Urethral stenosis S/P DILATIONS  . Urgency of urination     Family History  Problem Relation Age of Onset  . Colon cancer Maternal Grandmother   . Colon cancer Cousin   . Allergies Mother   . Asthma Mother   . Heart disease Mother   . Osteoarthritis Mother   . Diabetes Mother   . Irritable bowel syndrome Mother   . Brain cancer Father   . Osteoarthritis Father   . Colon polyps Father        adenomatous  . Heart disease Father   . Melanoma Sister   . Osteoarthritis Sister   . Irritable bowel syndrome Sister   . Diabetes Sister   . Heart disease Sister   . Stroke Sister   . Ulcerative colitis Maternal Uncle   . Stomach cancer Neg Hx     . Rheumatologic disease Neg Hx     Past Surgical History:  Procedure Laterality Date  . ABDOMINAL HYSTERECTOMY  1988  . APPENDECTOMY  04/ 1986  . bilateral reduction mastopexy  1988  . bilateral turbinate resection  1990   nasal spreading graft  . CATARACT EXTRACTION W/ INTRAOCULAR LENS  IMPLANT, BILATERAL  08 and 09/ 2017  . CHOLECYSTECTOMY  04/ 1986  . COLONOSCOPY  02/2004, 11/2010   2005: Normal - Dr. Sammuel Cooper 2012: Normal  . CYSTO WITH HYDRODISTENSION N/A 12/17/2013   Procedure: CYSTO HYDRODISTENSION WITH INSTALLATION OF CHLOROPACTIN AND TETRACAINE;  Surgeon: Malka So, MD;  Location: [facility];  Service: Urology;  Laterality: N/A;  . CYSTO WITH HYDRODISTENSION N/A 06/02/2015   Procedure: CYSTOSCOPY/HYDRODISTENSION WITH INSTILLATION OF CLORPACTIN ;  Surgeon: [name], MD;  Location: [facility];  Service: Urology;  Laterality: N/A;  . CYSTO WITH HYDRODISTENSION N/A 11/08/2016   Procedure: CYSTOSCOPY/HYDRODISTENSION INSTILLATION OF CLORPACTIN;  Surgeon: [name], MD;  Location: [facility];  Service: Urology;  Laterality: N/A;  . CYSTOSCOPY WITH URETHRAL DILATATION  07/12/2011   Procedure: CYSTOSCOPY WITH URETHRAL DILATATION;  Surgeon: Malka So, MD;  Location: [facility];  Service: Urology;  Laterality: N/A;  HOD AND INSTILLATION OF CHLOROPACTIN C-ARM   . CYSTOSCOPY WITH URETHRAL DILATATION N/A 06/26/2012   Procedure: CYSTOSCOPY WITH URETHRAL DILATATION HYDRODISTENSION AND INSTILLATION OF CLORPACTION ;  Surgeon: Malka So, MD;  Location: [facility];  Service: Urology;  Laterality: N/A;  CYSTOSCOPY WITH URETHRAL DILATATION HYDRODISTENSION AND INSTILLATION OF CLORPACTION   . CYSTOSTOMY W/ BLADDER BIOPSY  1983  . DILATION AND CURETTAGE OF UTERUS  1987  . ERCP  1988   for pancreatitis with stents   S/P SEVERE PANCREATITIS  . HEMORRHOID SURGERY    . LASIK Bilateral APRIL 2005  . MULTIPLE CYSTO/ HOD/  URETHRAL DILATION/ INSTILLATION CLORPACTIN  .LAST ONE 03-31-2010  . NASAL SINUS SURGERY  1977   sinus cyst  . REDUCTION MAMMAPLASTY Bilateral 1988  . revision rhinoplasty  1978  . RHINOPLASTY  1977  . RIGHT SALPINGOOPHECTOMY  1998  . TONSILLECTOMY AND ADENOIDECTOMY  1963  . VIDEO BRONCHOSCOPY Bilateral 08/23/2016   Procedure: VIDEO BRONCHOSCOPY WITHOUT FLUORO;  Surgeon: Javier Glazier, MD;  Location: [facility];  Service: Cardiopulmonary;  Laterality: Bilateral;   Social History   Occupational History  . Occupation: Therapist, sports  Tobacco Use  . Smoking status: Passive Smoke Exposure - Never Smoker  . Smokeless tobacco: Never Used  . Tobacco comment: Father smoked briefly  Substance and Sexual Activity  . Alcohol use: No  . Drug use: No  . Sexual activity: Not on file

## 2017-07-02 DIAGNOSIS — R350 Frequency of micturition: Secondary | ICD-10-CM | POA: Diagnosis not present

## 2017-07-09 DIAGNOSIS — R350 Frequency of micturition: Secondary | ICD-10-CM | POA: Diagnosis not present

## 2017-07-10 DIAGNOSIS — R102 Pelvic and perineal pain: Secondary | ICD-10-CM | POA: Diagnosis not present

## 2017-07-10 DIAGNOSIS — N3010 Interstitial cystitis (chronic) without hematuria: Secondary | ICD-10-CM | POA: Diagnosis not present

## 2017-07-10 DIAGNOSIS — M62838 Other muscle spasm: Secondary | ICD-10-CM | POA: Diagnosis not present

## 2017-07-10 DIAGNOSIS — M6281 Muscle weakness (generalized): Secondary | ICD-10-CM | POA: Diagnosis not present

## 2017-07-19 DIAGNOSIS — N3010 Interstitial cystitis (chronic) without hematuria: Secondary | ICD-10-CM | POA: Diagnosis not present

## 2017-07-23 DIAGNOSIS — N3010 Interstitial cystitis (chronic) without hematuria: Secondary | ICD-10-CM | POA: Diagnosis not present

## 2017-07-23 DIAGNOSIS — M6281 Muscle weakness (generalized): Secondary | ICD-10-CM | POA: Diagnosis not present

## 2017-07-23 DIAGNOSIS — R102 Pelvic and perineal pain: Secondary | ICD-10-CM | POA: Diagnosis not present

## 2017-07-23 DIAGNOSIS — R350 Frequency of micturition: Secondary | ICD-10-CM | POA: Diagnosis not present

## 2017-07-23 DIAGNOSIS — M62838 Other muscle spasm: Secondary | ICD-10-CM | POA: Diagnosis not present

## 2017-07-24 DIAGNOSIS — Z0000 Encounter for general adult medical examination without abnormal findings: Secondary | ICD-10-CM | POA: Diagnosis not present

## 2017-07-24 DIAGNOSIS — I10 Essential (primary) hypertension: Secondary | ICD-10-CM | POA: Diagnosis not present

## 2017-07-29 ENCOUNTER — Ambulatory Visit (INDEPENDENT_AMBULATORY_CARE_PROVIDER_SITE_OTHER)
Admission: RE | Admit: 2017-07-29 | Discharge: 2017-07-29 | Disposition: A | Discharge status: Home / Self Care | Payer: PPO | Source: Ambulatory Visit | Attending: Internal Medicine | Admitting: Internal Medicine

## 2017-07-29 DIAGNOSIS — R0602 Shortness of breath: Secondary | ICD-10-CM

## 2017-07-29 DIAGNOSIS — R918 Other nonspecific abnormal finding of lung field: Secondary | ICD-10-CM | POA: Diagnosis not present

## 2017-07-29 NOTE — Progress Notes (Signed)
LMTCB

## 2017-07-30 DIAGNOSIS — H61002 Unspecified perichondritis of left external ear: Secondary | ICD-10-CM | POA: Diagnosis not present

## 2017-07-30 NOTE — Progress Notes (Signed)
Spoke with pt and notified of results per Dr. Wert. Pt verbalized understanding and denied any questions. 

## 2017-08-01 DIAGNOSIS — R350 Frequency of micturition: Secondary | ICD-10-CM | POA: Diagnosis not present

## 2017-08-06 DIAGNOSIS — Z23 Encounter for immunization: Secondary | ICD-10-CM | POA: Diagnosis not present

## 2017-08-06 DIAGNOSIS — R350 Frequency of micturition: Secondary | ICD-10-CM | POA: Diagnosis not present

## 2017-08-06 DIAGNOSIS — R102 Pelvic and perineal pain: Secondary | ICD-10-CM | POA: Diagnosis not present

## 2017-08-06 DIAGNOSIS — N3010 Interstitial cystitis (chronic) without hematuria: Secondary | ICD-10-CM | POA: Diagnosis not present

## 2017-08-06 DIAGNOSIS — M6281 Muscle weakness (generalized): Secondary | ICD-10-CM | POA: Diagnosis not present

## 2017-08-06 DIAGNOSIS — M62838 Other muscle spasm: Secondary | ICD-10-CM | POA: Diagnosis not present

## 2017-08-12 ENCOUNTER — Encounter: Payer: Self-pay | Admitting: Internal Medicine

## 2017-08-12 ENCOUNTER — Other Ambulatory Visit (INDEPENDENT_AMBULATORY_CARE_PROVIDER_SITE_OTHER): Payer: PPO

## 2017-08-12 ENCOUNTER — Ambulatory Visit: Payer: PPO | Admitting: Internal Medicine

## 2017-08-12 VITALS — BP 110/80 | HR 75 | Ht 59.0 in | Wt 146.6 lb

## 2017-08-12 DIAGNOSIS — R05 Cough: Secondary | ICD-10-CM

## 2017-08-12 DIAGNOSIS — R058 Other specified cough: Secondary | ICD-10-CM | POA: Insufficient documentation

## 2017-08-12 LAB — NITRIC OXIDE: Nitric Oxide: 27

## 2017-08-12 LAB — CBC WITH DIFFERENTIAL/PLATELET
Basophils Absolute: 0.1 10*3/uL (ref 0.0–0.1)
Basophils Relative: 1.1 % (ref 0.0–3.0)
Eosinophils Absolute: 0.1 10*3/uL (ref 0.0–0.7)
Eosinophils Relative: 1.8 % (ref 0.0–5.0)
HCT: 40.4 % (ref 36.0–46.0)
Hemoglobin: 13.7 g/dL (ref 12.0–15.0)
Lymphocytes Relative: 25.1 % (ref 12.0–46.0)
Lymphs Abs: 1.9 10*3/uL (ref 0.7–4.0)
MCHC: 34 g/dL (ref 30.0–36.0)
MCV: 93.1 fl (ref 78.0–100.0)
Monocytes Absolute: 0.7 10*3/uL (ref 0.1–1.0)
Monocytes Relative: 8.5 % (ref 3.0–12.0)
Neutro Abs: 4.9 10*3/uL (ref 1.4–7.7)
Neutrophils Relative %: 63.5 % (ref 43.0–77.0)
Platelets: 327 10*3/uL (ref 150.0–400.0)
RBC: 4.34 Mil/uL (ref 3.87–5.11)
RDW: 13.7 % (ref 11.5–15.5)
WBC: 7.7 10*3/uL (ref 4.0–10.5)

## 2017-08-12 NOTE — Assessment & Plan Note (Addendum)
MAXILLOFACIAL CT LTD W/O 04/19/16 Visualized paranasal sinuses are essentially clear. BARIUM SWALLOW/ESOPHAGRAM 04/16/16  Mild gastroesophageal reflux. No stricture. 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   These are the same pts (now being labeled as having "irritable larynx syndrome" by some cough centers) who not infrequently have a history of having failed to tolerate ace inhibitors,  dry powder inhalers or biphosphonates or report having atypical/extraesophageal reflux symptoms that don't respond to standard doses of PPI  and are easily confused as having aecopd or asthma flares by even experienced allergists/ pulmonologists (myself included).    Of the three most common causes of  Sub-acute / recurrent or chronic cough, only one (GERD)  can actually contribute to/ trigger  the other two (asthma and post nasal drip syndrome)  and perpetuate the cylce of cough.  While not intuitively obvious, many patients with chronic low grade reflux (which we know she has based on above DgEs)  do not cough until there is a primary insult that disturbs the protective epithelial barrier and exposes sensitive nerve endings.   This is  typically viral but can due to PNDS and  either may apply here.   The point is that once this occurs, it is difficult to eliminate the cycle  using anything but a maximally effective acid suppression regimen at least in the short run, accompanied by an appropriate diet to address non acid GERD and control / eliminate the perception of pnds with 1st gen H1 blockers per guidelines and dymista at full dosing then regroup in 4 weeks   Discussed with pt: The standardized cough guidelines published in Chest by Lissa Morales in 2006 are still the best available and consist of a multiple step process (up to 12!) , not a single office visit,  and are intended  to address this problem logically,  with an alogrithm dependent on response to empiric treatment at  each progressive step  to determine a specific diagnosis with  minimal addtional testing needed. Therefore if adherence is an issue or can't be accurately verified,  it's very unlikely the standard evaluation and treatment will be successful here.    Furthermore, response to therapy (other than acute cough suppression, which should only be used short term with avoidance of narcotic containing cough syrups if possible), can be a gradual process for which the patient is not likely to  perceive immediate benefit.  Unlike going to an eye doctor where the best perscription is almost always the first one and is immediately effective, this is almost never the case in the management of chronic cough syndromes. Therefore the patient  needs to commit up front to consistently adhere to recommendations  for up to 6 weeks of therapy directed at the likely underlying problem(s) before the response can be reasonably evaluated.    I had an extended discussion with the patient reviewing all relevant studies completed to date and  lasting 25 minutes of a 40  minute transition of care office visit to establish with me re non-specific but potentially very serious refractory  respiratory symptoms of uncertain and potentially multiple  etiologies.   Each maintenance medication was reviewed in detail including most importantly the difference between maintenance and prns and under what circumstances the prns are to be triggered using an action plan format that is not reflected in the computer generated alphabetically organized AVS.    Please see AVS for specific instructions unique to this office visit that I personally wrote and verbalized to the the pt in detail and then reviewed with pt  by my nurse highlighting any changes in therapy/plan of care  recommended at today's visit.         Will regroup in 4 weeks with all meds in hand using a trust but verify approach to confirm accurate Medication  Reconciliation The principal here is that until we are certain that the  patients are doing what we've asked, it makes no sense to ask them to do more.

## 2017-08-12 NOTE — Patient Instructions (Addendum)
Dysmista should be twice daily pointed toward your ear on the same side  For drainage / throat tickle try take CHLORPHENIRAMINE  4 mg - take one every 4 hours as needed - available over the counter- may cause drowsiness so start with just a bedtime dose or two and see how you tolerate it before trying in daytime     Stop clariton/zyrtec and cloricidan and cough drops    Protonix 40  Take 30-60 min before first meal of the day and continue the zantac 150 mg at bedtime  GERD (REFLUX)  is an extremely common cause of respiratory symptoms just like yours , many times with no obvious heartburn at all.    It can be treated with medication, but also with lifestyle changes including elevation of the head of your bed (ideally with 6 inch  bed blocks),  Smoking cessation, avoidance of late meals, excessive alcohol, and avoid fatty foods, chocolate, peppermint, colas, red wine, and acidic juices such as orange juice.  NO MINT OR MENTHOL PRODUCTS SO NO COUGH DROPS   USE SUGARLESS CANDY INSTEAD (Jolley ranchers or Stover's or Life Savers) or even ice chips will also do - the key is to swallow to prevent all throat clearing. NO OIL BASED VITAMINS - use powdered substitutes.    Please remember to go to the lab department downstairs in the basement  for your tests - we will call you with the results when they are available.  Please schedule a follow up office visit in 4 weeks, sooner if needed  with all medications /inhalers/ solutions in hand so we can verify exactly what you are taking. This includes all medications from all doctors and over the counters

## 2017-08-12 NOTE — Progress Notes (Addendum)
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 > Bonner > shots x 20 years > stopped by Donneta Romberg  Around 2000 >  Still had runny nose and tendency of "every head cold went into chest" but overall better until 2006 then recurrent pattern of episodes of cough / chest tightness typically winter x 3-4 weeks total  Need inhaler just while sick then in between no need albuterol but never really  100% better while on lisinopril but this stopped in 2015 but pattern of cough continued  And since  Sept 2017   cough has persisted with even only little better on  prednisone self referred back to pulmonary clinic to establish with me p Dr Ammie Dalton last eval 03/14/17 rec  Continue using your medications as prescribed.  Try using the Dymista nasal spray in place of Flonase. Start with 1 spray in each nostril once daily.  Try to use cough drops & hard candy to thin out the secretions in the back of your throat and decrease the need for throat clearing.  We will also let you know once I've reviewed the visit note from Rheumatology.    TESTS ORDERED: 1. CT CHEST W/O March 2019   08/12/2017  f/u ov/Adetokunbo Mccadden re:  Chief Complaint  Patient presents with  . Follow-up    Former patient of Dr Ashok Cordia. She states her cough is some better since she was seen last. She states she is having some PND and occ will produce some yellow sputum.  She is using her albuterol inhaler 2-3 x per wk on average.          Kouffman Reflux v Neurogenic Cough Differentiator Reflux Comments  Do you awaken from a sound sleep coughing violently?                            With trouble breathing? No not now Not now   Do you have choking episodes when you cannot  Get enough air, gasping for air ?              Yes    Do you usually cough when you lie down into  The bed,  or when you just lie down to rest ?                          no   Do you usually cough after meals or eating?         Gets choked   Do you cough when (or after) you bend over?    no   GERD SCORE     Kouffman Reflux v Neurogenic Cough Differentiator Neurogenic   Do you more-or-less cough all day long? sporadic   Does change of temperature make you cough? no   Does laughing or chuckling cause you to cough? sometimes   Do fumes (perfume, automobile fumes, burned  Toast, etc.,) cause you to cough ?      Yes def perfume   Does speaking, singing, or talking on the phone cause you to cough   ?               Yes    Neurogenic/Airway score  No other obvious day to day or daytime variability or assoc  mucus plugs or hemoptysis or cp or chest tightness, subjective wheeze or overt sinus or hb symptoms. No unusual exposure hx or h/o childhood pna/ asthma or knowledge of premature birth.  Sleeping ok flat without nocturnal  or early am exacerbation  of respiratory  c/o's or need for noct saba. Also denies any obvious fluctuation of symptoms with weather or environmental changes or other aggravating or alleviating factors except as outlined above   Current Allergies, Complete Past Medical History, Past Surgical History, Family History, and Social History were reviewed in [employer] record.  ROS  The following are not active complaints unless bolded Hoarseness, sore throat, dysphagia, dental problems, itching, sneezing,  nasal congestion or discharge of excess mucus or purulent secretions, ear ache,   fever, chills, sweats, unintended wt loss or wt gain, classically pleuritic or exertional cp,  orthopnea pnd or leg swelling, presyncope, palpitations, abdominal pain, anorexia, nausea, vomiting, diarrhea  or change in bowel habits or change in bladder habits, change in stools or change in urine, dysuria, hematuria,  rash, arthralgias, visual complaints, headache, numbness,  weakness or ataxia or problems with walking or coordination,  change in mood/affect or memory.        Current Meds  Medication Sig  . acetaminophen (TYLENOL) 325 MG tablet Take 500 mg by mouth every 6 (six) hours as needed (for headaches).   Marland Kitchen albuterol (VENTOLIN HFA) 108 (90 Base) MCG/ACT inhaler Inhale into the lungs every 6 (six) hours as needed for wheezing or shortness of breath.  . Azelastine-Fluticasone (DYMISTA) 137-50 MCG/ACT SUSP Place 1 spray into the nose daily.  . Calcium Carbonate-Vitamin D (CALCIUM + D PO) Take 1 tablet by mouth daily.   . Coenzyme Q10 (CO Q-10 PO) Take 1 capsule by mouth 2 (two) times a week.  . ezetimibe (ZETIA) 10 MG tablet Take 10 mg by mouth daily.   Marland Kitchen FLUoxetine (PROZAC) 10 MG tablet   . folic acid (FOLVITE) 1 MG tablet Take 1 mg by mouth daily.  Marland Kitchen LORazepam (ATIVAN) 1 MG tablet Take 1 mg by mouth every 8 (eight) hours as needed for anxiety.   Marland Kitchen losartan (COZAAR) 50 MG tablet Take 50 mg by mouth daily.  . metoprolol (LOPRESSOR) 50 MG tablet Take 25 mg by mouth 2 (two) times daily.   . montelukast (SINGULAIR) 10 MG tablet Take 10 mg by mouth daily.   . Multiple Vitamin (MULTIVITAMIN) tablet Take 1 tablet by mouth daily.  . Omega-3 Fatty Acids (FISH OIL PO) Take 1 capsule by mouth 2 (two) times a week.  . pantoprazole (PROTONIX) 40 MG tablet Does not eat breakfast   . ranitidine (ZANTAC) 150 MG tablet Take 1 tablet (150 mg total) by mouth at bedtime.  . Vitamin D, Ergocalciferol, (DRISDOL) 50000 UNITS CAPS Take 50,000 Units by mouth every 7 (seven) days.                 Objective:   Physical Exam   Amb wf nad with freq throat clearing/ dry quality barking cough   Wt Readings from Last 3 Encounters:  08/12/17 146 lb 9.6 oz (66.5 kg)  07/01/17 140 lb (63.5 kg)  05/20/17 139 lb (63 kg)     Vital signs reviewed - Note on arrival 02 sats  97% on RA     HEENT: nl dentition, turbinates bilaterally, and oropharynx which is pristine. Nl external ear  canals without cough reflex  NECK :  without JVD/Nodes/TM/ nl carotid upstrokes bilaterally   LUNGS: no acc muscle use,  Nl contour chest which is clear to A and P bilaterally with cough on insp   maneuvers   CV:  RRR  no s3 or murmur or increase in P2, and no edema   ABD:  soft and nontender with nl inspiratory excursion in the supine position. No bruits or organomegaly appreciated, bowel sounds nl  MS:  Nl gait/ ext warm without deformities, calf tenderness, cyanosis or clubbing No obvious joint restrictions   SKIN: warm and dry without lesions    NEURO:  alert, approp, nl sensorium with  no motor or cerebellar deficits apparent.         PFT 03/14/17: FVC 2.27 L (84%) FEV1 1.94 L (95%) FEV1/FVC 0.86 FEF 25-75 2.61 L (137%)                                                                                                             DLCO corrected 72%               I personally reviewed images and agree with radiology impression as follows:   Chest CT 07/29/17 Scattered pulmonary nodules are unchanged from 07/17/2016 and considered benign.       .Labs ordered 08/12/2017  Allergy profile    Assessment & Plan:

## 2017-08-13 DIAGNOSIS — R350 Frequency of micturition: Secondary | ICD-10-CM | POA: Diagnosis not present

## 2017-08-13 LAB — RESPIRATORY ALLERGY PROFILE REGION II ~~LOC~~
Allergen, A. alternata, m6: <0.1 kU/L
Allergen, Cedar tree, t12: 0.13 kU/L — ABNORMAL HIGH
Allergen, Comm Silver Birch, t9: <0.1 kU/L
Allergen, Cottonwood, t14: <0.1 kU/L
Allergen, D pternoyssinus,d7: <0.1 kU/L
Allergen, Mouse Urine Protein, e78: <0.1 kU/L
Allergen, Mulberry, t76: <0.1 kU/L
Allergen, Oak,t7: <0.1 kU/L
Allergen, P. notatum, m1: <0.1 kU/L
Aspergillus fumigatus, m3: <0.1 kU/L
Bermuda Grass: <0.1 kU/L
Box Elder IgE: <0.1 kU/L
CLADOSPORIUM HERBARUM (M2) IGE: <0.1 kU/L
COMMON RAGWEED (SHORT) (W1) IGE: <0.1 kU/L
Cat Dander: <0.1 kU/L
Class: 0
Class: 0
Class: 0
Class: 0
Class: 0
Class: 0
Class: 0
Class: 0
Class: 0
Class: 0
Class: 0
Class: 0
Class: 0
Class: 0
Class: 0
Class: 0
Class: 0
Class: 0
Class: 0
Class: 0
Class: 0
Class: 0
Class: 0
Class: 0
Cockroach: <0.1 kU/L
D. farinae: <0.1 kU/L
Dog Dander: <0.1 kU/L
Elm IgE: <0.1 kU/L
IgE (Immunoglobulin E), Serum: <2 kU/L (ref <=114)
Johnson Grass: <0.1 kU/L
Pecan/Hickory Tree IgE: <0.1 kU/L
Rough Pigweed  IgE: <0.1 kU/L
Sheep Sorrel IgE: <0.1 kU/L
Timothy Grass: <0.1 kU/L

## 2017-08-13 LAB — INTERPRETATION:

## 2017-08-14 NOTE — Progress Notes (Signed)
Spoke with pt and notified of results per Dr. Wert. Pt verbalized understanding and denied any questions. 

## 2017-08-20 DIAGNOSIS — R350 Frequency of micturition: Secondary | ICD-10-CM | POA: Diagnosis not present

## 2017-08-20 DIAGNOSIS — N3010 Interstitial cystitis (chronic) without hematuria: Secondary | ICD-10-CM | POA: Diagnosis not present

## 2017-09-02 DIAGNOSIS — R102 Pelvic and perineal pain: Secondary | ICD-10-CM | POA: Diagnosis not present

## 2017-09-02 DIAGNOSIS — M6281 Muscle weakness (generalized): Secondary | ICD-10-CM | POA: Diagnosis not present

## 2017-09-02 DIAGNOSIS — M62838 Other muscle spasm: Secondary | ICD-10-CM | POA: Diagnosis not present

## 2017-09-02 DIAGNOSIS — N3010 Interstitial cystitis (chronic) without hematuria: Secondary | ICD-10-CM | POA: Diagnosis not present

## 2017-09-03 DIAGNOSIS — Z23 Encounter for immunization: Secondary | ICD-10-CM | POA: Diagnosis not present

## 2017-09-09 ENCOUNTER — Ambulatory Visit: Payer: PPO | Admitting: Internal Medicine

## 2017-09-09 ENCOUNTER — Encounter: Payer: Self-pay | Admitting: Internal Medicine

## 2017-09-09 VITALS — BP 110/74 | HR 67 | Ht 59.0 in | Wt 143.0 lb

## 2017-09-09 DIAGNOSIS — R05 Cough: Secondary | ICD-10-CM | POA: Diagnosis not present

## 2017-09-09 DIAGNOSIS — R058 Other specified cough: Secondary | ICD-10-CM

## 2017-09-09 MED ORDER — GABAPENTIN 100 MG PO CAPS: 100.0000 mg | ORAL_CAPSULE | Freq: Three times a day (TID) | ORAL | 2 refills | Status: DC

## 2017-09-09 NOTE — Progress Notes (Signed)
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 > Bonner > shots x 20 years > stopped by Donneta Romberg  Around 2000 >  Still had runny nose and tendency of "every head cold went into chest" but overall better until 2006 then recurrent pattern of episodes of cough / chest tightness typically winter x 3-4 weeks total  Need inhaler just while sick then in between no need albuterol but never really  100% better while on lisinopril but this stopped in 2015 but pattern of cough continued  And since  Sept 2017   cough has persisted with even "only little better" on  prednisone self referred back to pulmonary clinic to establish with me p Dr Ammie Dalton last eval 03/14/17 rec  Continue using your medications as prescribed.  Try using the Dymista nasal spray in place of Flonase. Start with 1 spray in each nostril once daily.  Try to use cough drops & hard candy to thin out the secretions in the back of your throat and decrease the need for throat clearing.  We will also let you know once I've reviewed the visit note from Rheumatology.       08/12/2017  f/u ov/Mckinzi Eriksen re:  All started with bronchitis 2006 intermittent problem 3-4 weeks once or twice yearly  then onset of drainage 2017 mostly present during the day or immediately on lying down  Chief Complaint  Patient presents with  . Follow-up    Former patient of Dr Ashok Cordia. She states her cough is some better since she was seen last. She states she is having some PND and occ will produce some yellow sputum.  She is using her albuterol inhaler 2-3 x per wk on average.    Kouffman Reflux v Neurogenic Cough Differentiator Reflux Comments  Do you awaken from a sound sleep coughing violently?                            With trouble breathing? No not now Not now   Do you have choking episodes  when you cannot  Get enough air, gasping for air ?              Yes    Do you usually cough when you lie down into  The bed, or when you just lie down to rest ?                          no   Do you usually cough after meals or eating?         Gets choked   Do you cough when (or after) you bend over?    no   GERD SCORE     Kouffman Reflux v Neurogenic Cough Differentiator Neurogenic   Do you more-or-less cough all day long? sporadic   Does change of temperature make you cough? no   Does laughing or chuckling cause you to cough? sometimes   Do fumes (perfume, automobile fumes, burned  Toast, etc.,) cause you to cough ?      Yes def perfume   Does speaking, singing, or talking on the phone cause you to cough   ?  Yes    Neurogenic/Airway score    rec Dysmista should be twice daily pointed toward your ear on the same side For drainage / throat tickle try take CHLORPHENIRAMINE  4 mg - take one every 4 hours as needed - available over the counter- may cause drowsiness so start with just a bedtime dose or two and see how you tolerate it before trying in daytime   Stop clariton/zyrtec and cloricidan and cough drops  Protonix 40  Take 30-60 min before first meal of the day and continue the zantac 150 mg at bedtime GERD diet   Please schedule a follow up office visit in 4 weeks, sooner if needed  with all medications /inhalers/ solutions in hand so we can verify exactly what you are taking. This includes all medications from all doctors and over the counters   09/09/2017  f/u ov/Lerline Valdivia re:  uacs / better with h1 but only for 4 hours / did not bring meds  Chief Complaint  Patient presents with  . Follow-up    Cough and PND are unchanged. No new co's.    Dyspnea:  More so than usual x months Cough: clear mucus  Sleep: ok once gets to sleep s exac noc or awakening  SABA use:  Not much use but feels tight today   h1 helps some but only for 4 hours and can't take it much daytime due to  sleepiness - not clear dymista helping    No obvious day to day or daytime variability or assoc excess/ purulent sputum or mucus plugs or hemoptysis or cp or chest tightness, subjective wheeze or overt sinus or hb symptoms. No unusual exposure hx or h/o childhood pna/ asthma or knowledge of premature birth.  Sleeping p h1  without nocturnal  or early am exacerbation  of respiratory  c/o's or need for noct saba. Also denies any obvious fluctuation of symptoms with weather or environmental changes or other aggravating or alleviating factors except as outlined above   Current Allergies, Complete Past Medical History, Past Surgical History, Family History, and Social History were reviewed in [employer] record.  ROS  The following are not active complaints unless bolded Hoarseness, sore throat, dysphagia, dental problems, itching, sneezing,  nasal congestion or discharge of excess mucus or purulent secretions, ear ache,   fever, chills, sweats, unintended wt loss or wt gain, classically pleuritic or exertional cp,  orthopnea pnd or arm/hand swelling  or leg swelling, presyncope, palpitations, abdominal pain, anorexia, nausea, vomiting, diarrhea  or change in bowel habits or change in bladder habits, change in stools or change in urine, dysuria, hematuria,  rash, arthralgias, visual complaints, headache, numbness, weakness or ataxia or problems with walking or coordination,  change in mood or  memory.        Current Meds  Medication Sig  . acetaminophen (TYLENOL) 325 MG tablet Take 500 mg by mouth every 6 (six) hours as needed (for headaches).   Marland Kitchen albuterol (VENTOLIN HFA) 108 (90 Base) MCG/ACT inhaler Inhale into the lungs every 6 (six) hours as needed for wheezing or shortness of breath.  . Azelastine-Fluticasone (DYMISTA) 137-50 MCG/ACT SUSP Place 1 spray into the nose daily.  . Calcium Carbonate-Vitamin D (CALCIUM + D PO) Take 1 tablet by mouth daily.   . Coenzyme Q10 (CO Q-10  PO) Take 1 capsule by mouth 2 (two) times a week.  . Collagen Hydrolysate POWD by Does not apply route daily.  Marland Kitchen ezetimibe (ZETIA) 10 MG tablet  Take 10 mg by mouth daily.   Marland Kitchen FLUoxetine (PROZAC) 10 MG tablet   . folic acid (FOLVITE) 1 MG tablet Take 1 mg by mouth daily.  Marland Kitchen LORazepam (ATIVAN) 1 MG tablet Take 1 mg by mouth every 8 (eight) hours as needed for anxiety.   Marland Kitchen losartan (COZAAR) 50 MG tablet Take 50 mg by mouth daily.  Marland Kitchen MAGNESIUM PO Take 1 tablet by mouth daily.  . metoprolol (LOPRESSOR) 50 MG tablet Take 25 mg by mouth 2 (two) times daily.   . montelukast (SINGULAIR) 10 MG tablet Take 10 mg by mouth daily.   . Multiple Vitamin (MULTIVITAMIN) tablet Take 1 tablet by mouth daily.  . Omega-3 Fatty Acids (FISH OIL PO) Take 1 capsule by mouth 2 (two) times a week.  . pantoprazole (PROTONIX) 40 MG tablet   . Probiotic Product (PROBIOTIC PO) Take 1 capsule by mouth daily.  . ranitidine (ZANTAC) 150 MG tablet Take 1 tablet (150 mg total) by mouth at bedtime.  . Vitamin D, Ergocalciferol, (DRISDOL) 50000 UNITS CAPS Take 50,000 Units by mouth every 7 (seven) days.                        Objective:   Physical Exam   amb wf freq throat clearing   09/09/2017         143   08/12/17 146 lb 9.6 oz (66.5 kg)  07/01/17 140 lb (63.5 kg)  05/20/17 139 lb (63 kg)     Vital signs reviewed - Note on arrival 02 sats  97% on RA     HEENT: nl dentition, turbinates bilaterally, and oropharynx which is pristine. Nl external ear canals without cough reflex   NECK :  without JVD/Nodes/TM/ nl carotid upstrokes bilaterally   LUNGS: no acc muscle use,  Nl contour chest which is clear to A and P bilaterally without cough on insp or exp maneuvers   CV:  RRR  no s3 or murmur or increase in P2, and no edema   ABD:  soft and nontender with nl inspiratory excursion in the supine position. No bruits or organomegaly appreciated, bowel sounds nl  MS:  Nl gait/ ext warm without deformities, calf  tenderness, cyanosis or clubbing No obvious joint restrictions   SKIN: warm and dry without lesions    NEURO:  alert, approp, nl sensorium with  no motor or cerebellar deficits apparent.         I personally reviewed images and agree with radiology impression as follows:   Chest CT 07/29/17 1. Scattered pulmonary nodules are unchanged from 07/17/2016 and considered benign.        Assessment & Plan:

## 2017-09-09 NOTE — Patient Instructions (Addendum)
Please see patient coordinator before you leave today  to schedule Methacholine challenge testing   Gabapentin 100 mg three times a daily   Finish your present bottle of dysmista to see what difference it makes   If not improved we will be referring you to ENT at [facility] Dr Bettina Gavia

## 2017-09-10 NOTE — Assessment & Plan Note (Addendum)
MAXILLOFACIAL CT LTD W/O 04/19/16 Visualized paranasal sinuses are essentially clear. BARIUM SWALLOW/ESOPHAGRAM 04/16/16  Mild gastroesophageal reflux. No stricture. - Spirometry 08/12/2017  wnl on singulair mono rx - FENO 08/12/2017  =   27 on singulair only  - 08/12/2017 added 1st gen H1 blockers per guidelines  Plus gerd diet   - Allergy profile 08/12/2017 >  Eos 0.1 /  IgE  2  RAST pos only [place]   - trial of gabapentin 09/09/2017  and finish up dymista and don't restart as not reducing need for 1st gen h1 as hoped and may not be working at all so given the cost no need to continue empirical rx and trial off approp - MCT ordered 09/09/2017   Lack of cough resolution on a verified empirical regimen could mean an alternative diagnosis (cough variant asthma, need to rule out vs irritable larynx syndrome) , persistence of the disease state (eg sinusitis or bronchiectasis which have already been ruled out ) , or inadequacy of currently available therapy (eg no medical rx available for non-acid gerd) which remains a concern and is perpetuated by throat clearing  rec trial of gabapentin for irritable larynx and proceed with mct/ no change gerd rx in meantime   I had an extended discussion with the patient reviewing all relevant studies completed to date and  lasting 15 to 20 minutes of a 25 minute visit    Each maintenance medication was reviewed in detail including most importantly the difference between maintenance and prns and under what circumstances the prns are to be triggered using an action plan format that is not reflected in the computer generated alphabetically organized AVS.    Please see AVS for specific instructions unique to this visit that I personally wrote and verbalized to the the pt in detail and then reviewed with pt  by my nurse highlighting any  changes in therapy recommended at today's visit to their plan of care.

## 2017-09-11 ENCOUNTER — Encounter: Payer: Self-pay | Admitting: Internal Medicine

## 2017-09-12 DIAGNOSIS — N3010 Interstitial cystitis (chronic) without hematuria: Secondary | ICD-10-CM | POA: Diagnosis not present

## 2017-09-20 ENCOUNTER — Ambulatory Visit (HOSPITAL_COMMUNITY)
Admission: RE | Admit: 2017-09-20 | Discharge: 2017-09-20 | Disposition: A | Discharge status: Home / Self Care | Payer: PPO | Source: Ambulatory Visit | Attending: Internal Medicine | Admitting: Internal Medicine

## 2017-09-20 DIAGNOSIS — R05 Cough: Secondary | ICD-10-CM | POA: Insufficient documentation

## 2017-09-20 DIAGNOSIS — R058 Other specified cough: Secondary | ICD-10-CM

## 2017-09-20 LAB — PULMONARY FUNCTION TEST
FEF 25-75 Post: 2.35 L/sec
FEF 25-75 Pre: 2.64 L/sec
FEF2575-%Change-Post: -11 %
FEF2575-%Pred-Post: 125 %
FEF2575-%Pred-Pre: 141 %
FEV1-%Change-Post: -3 %
FEV1-%Pred-Post: 90 %
FEV1-%Pred-Pre: 94 %
FEV1-Post: 1.84 L
FEV1-Pre: 1.91 L
FEV1FVC-%Change-Post: 4 %
FEV1FVC-%Pred-Pre: 112 %
FEV6-%Change-Post: -8 %
FEV6-%Pred-Post: 79 %
FEV6-%Pred-Pre: 86 %
FEV6-Post: 2.04 L
FEV6-Pre: 2.21 L
FEV6FVC-%Pred-Post: 104 %
FEV6FVC-%Pred-Pre: 104 %
FVC-%Change-Post: -8 %
FVC-%Pred-Post: 76 %
FVC-%Pred-Pre: 83 %
FVC-Post: 2.04 L
FVC-Pre: 2.21 L
Post FEV1/FVC ratio: 90 %
Post FEV6/FVC ratio: 100 %
Pre FEV1/FVC ratio: 86 %
Pre FEV6/FVC Ratio: 100 %

## 2017-09-20 MED ORDER — SODIUM CHLORIDE 0.9 % IN NEBU
3.0000 mL | INHALATION_SOLUTION | Freq: Once | RESPIRATORY_TRACT | Status: AC
  Administered 2017-09-20: 3 mL via RESPIRATORY_TRACT
  Filled 2017-09-20: qty 3

## 2017-09-20 MED ORDER — METHACHOLINE 4 MG/ML NEB SOLN
2.0000 mL | Freq: Once | RESPIRATORY_TRACT | Status: AC
  Administered 2017-09-20: 8 mg via RESPIRATORY_TRACT
  Filled 2017-09-20: qty 2

## 2017-09-20 MED ORDER — METHACHOLINE 0.25 MG/ML NEB SOLN
2.0000 mL | Freq: Once | RESPIRATORY_TRACT | Status: AC
  Administered 2017-09-20: 0.5 mg via RESPIRATORY_TRACT
  Filled 2017-09-20: qty 2

## 2017-09-20 MED ORDER — METHACHOLINE 1 MG/ML NEB SOLN
2.0000 mL | Freq: Once | RESPIRATORY_TRACT | Status: AC
  Administered 2017-09-20: 2 mg via RESPIRATORY_TRACT
  Filled 2017-09-20: qty 2

## 2017-09-20 MED ORDER — METHACHOLINE 16 MG/ML NEB SOLN
2.0000 mL | Freq: Once | RESPIRATORY_TRACT | Status: AC
  Administered 2017-09-20: 32 mg via RESPIRATORY_TRACT
  Filled 2017-09-20: qty 2

## 2017-09-20 MED ORDER — METHACHOLINE 0.0625 MG/ML NEB SOLN
2.0000 mL | Freq: Once | RESPIRATORY_TRACT | Status: AC
  Administered 2017-09-20: 0.125 mg via RESPIRATORY_TRACT
  Filled 2017-09-20: qty 2

## 2017-09-20 MED ORDER — ALBUTEROL SULFATE (2.5 MG/3ML) 0.083% IN NEBU
2.5000 mg | INHALATION_SOLUTION | Freq: Once | RESPIRATORY_TRACT | Status: AC
  Administered 2017-09-20: 2.5 mg via RESPIRATORY_TRACT

## 2017-09-24 ENCOUNTER — Telehealth: Payer: Self-pay | Admitting: Internal Medicine

## 2017-09-24 NOTE — Progress Notes (Signed)
LMTCB

## 2017-09-24 NOTE — Telephone Encounter (Signed)
Called and spoke to patient. Gave patient results per MW and scheduled ROV for first available in June. Patient would like to review results in greater detail and talk about plan of care. Nothing further needed at this time.

## 2017-10-09 DIAGNOSIS — F4001 Agoraphobia with panic disorder: Secondary | ICD-10-CM | POA: Diagnosis not present

## 2017-10-10 DIAGNOSIS — R945 Abnormal results of liver function studies: Secondary | ICD-10-CM | POA: Diagnosis not present

## 2017-10-10 DIAGNOSIS — N3010 Interstitial cystitis (chronic) without hematuria: Secondary | ICD-10-CM | POA: Diagnosis not present

## 2017-10-24 ENCOUNTER — Ambulatory Visit: Payer: PPO | Admitting: Internal Medicine

## 2017-10-24 ENCOUNTER — Encounter: Payer: Self-pay | Admitting: Internal Medicine

## 2017-10-24 VITALS — BP 128/90 | HR 70 | Ht 59.0 in | Wt 148.0 lb

## 2017-10-24 DIAGNOSIS — R05 Cough: Secondary | ICD-10-CM

## 2017-10-24 DIAGNOSIS — R058 Other specified cough: Secondary | ICD-10-CM

## 2017-10-24 MED ORDER — GABAPENTIN 300 MG PO CAPS
300.0000 mg | ORAL_CAPSULE | Freq: Three times a day (TID) | ORAL | 2 refills | Status: DC
Start: 2017-10-24 — End: 2018-03-03

## 2017-10-24 NOTE — Progress Notes (Addendum)
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 > Brenda [name] > shots x 20 years > stopped by Brenda [name]  Around 2000 >  Still had runny nose and tendency of "every head cold went into chest" but overall better until 2006 then recurrent pattern of episodes of cough / chest tightness typically winter x 3-4 weeks total  Need inhaler just while sick then in between no need albuterol but never really  100% better while on lisinopril but this stopped in 2015 but pattern of cough continued  And since  Sept 2017   cough has persisted with even "only little better" on  prednisone self referred back to pulmonary clinic to establish with me p Brenda [name] last eval 03/14/17 rec  Continue using your medications as prescribed.  Try using the Dymista nasal spray in place of Flonase. Start with 1 spray in each nostril once daily.  Try to use cough drops & hard candy to thin out the secretions in the back of your throat and decrease the need for throat clearing.  We will also let you know once I've reviewed the visit note from Rheumatology.       08/12/2017  f/u ov/Brenda [name] re:  All started with bronchitis 2006 intermittent problem 3-4 weeks once or twice yearly  then onset of drainage 2017 mostly present during the day or immediately on lying down  Chief Complaint  Patient presents with  . Follow-up    Former patient of Brenda [name]. She states her cough is some better since she was seen last. She states she is having some PND and occ will produce some yellow sputum.  She is using her albuterol inhaler 2-3 x per wk on average.    Brenda [name] v Neurogenic Cough Differentiator [name] Comments  Do you awaken from a sound sleep coughing violently?                            With trouble breathing? No not now Not now   Do you have choking episodes  when you cannot  Get enough air, gasping for air ?              Yes    Do you usually cough when you lie down into  The bed, or when you just lie down to rest ?                          no   Do you usually cough after meals or eating?         Gets choked   Do you cough when (or after) you bend over?    no   GERD SCORE     Brenda [name] v Neurogenic Cough Differentiator Neurogenic   Do you more-or-less cough all day long? sporadic   Does change of temperature make you cough? no   Does laughing or chuckling cause you to cough? sometimes   Do fumes (perfume, automobile fumes, burned  Toast, etc.,) cause you to cough ?      Yes def perfume   Does speaking, singing, or talking on the phone cause you to cough   ?  Yes    Neurogenic/Airway score    rec Dysmista should be twice daily pointed toward your ear on the same side For drainage / throat tickle try take CHLORPHENIRAMINE  4 mg - take one every 4 hours as needed - available over the counter- may cause drowsiness so start with just a bedtime dose or two and see how you tolerate it before trying in daytime   Stop clariton/zyrtec and cloricidan and cough drops  Protonix 40  Take 30-60 min before first meal of the day and continue the zantac 150 mg at bedtime GERD diet   Please schedule a follow up office visit in 4 weeks, sooner if needed  with all medications /inhalers/ solutions in hand so we can verify exactly what you are taking. This includes all medications from all doctors and over the counters   09/09/2017  f/u ov/Brenda [name] Want re:  uacs / better with h1 but only for 4 hours / did not bring meds  Chief Complaint  Patient presents with  . Follow-up    Cough and PND are unchanged. No new co's.    Dyspnea:  More so than usual x months Cough: clear mucus  Sleep: ok once gets to sleep s exac noc or awakening  SABA use:  Not much use but feels tight today  h1 helps some but only for 4 hours and can't take it much daytime due to  sleepiness - not clear dymista helping rec  schedule Methacholine challenge testing > Neg so pt stopped singulair  Gabapentin 100 mg three times a daily  Finish your present bottle of dysmista to see what difference it makes  If not improved we will be referring you to ENT at [facility] Brenda [name]    10/24/2017  f/u ov/Brenda [name] re: cough since sept 2017 / neg MCT  Chief Complaint  Patient presents with  . Follow-up    here to discuss negative MCT results.  Cough comes and goes. She occ produces some clear sputum.  She is having some sinus pressure and PND.   Dyspnea:  Not limited by breathing from desired activities   Cough: most am's waking up s cough then starts after stirring then sporadic for the rest of the day   not consistent with h1 at all Back on HRT   No obvious day to day or daytime variability or assoc excess/ purulent sputum or mucus plugs or hemoptysis or cp or chest tightness, subjective wheeze or overt sinus or hb symptoms.   Sleeping fine flat  without nocturnal  or early am exacerbation  of respiratory  c/o's or need for noct saba. Also denies any obvious fluctuation of symptoms with weather or environmental changes or other aggravating or alleviating factors except as outlined above   No unusual exposure hx or h/o childhood pna/ asthma or knowledge of premature birth.  Current Allergies, Complete Past Medical History, Past Surgical History, Family History, and Social History were reviewed in [employer] record.  ROS  The following are not active complaints unless bolded Hoarseness, sore throat, dysphagia, dental problems, itching, sneezing,  nasal congestion or discharge of excess mucus or purulent secretions, ear ache,   fever, chills, sweats, unintended wt loss or wt gain, classically pleuritic or exertional cp,  orthopnea pnd or arm/hand swelling  or leg swelling, presyncope, palpitations, abdominal pain, anorexia, nausea, vomiting, diarrhea  or change in  bowel habits or change in bladder habits, change in stools or change in urine, dysuria, hematuria,  rash, arthralgias, visual complaints, headache, numbness, weakness or ataxia or problems with walking or coordination,  change in mood or  memory.        Current Meds  Medication Sig  . acetaminophen (TYLENOL) 325 MG tablet Take 500 mg by mouth every 6 (six) hours as needed (for headaches).   Marland Kitchen albuterol (VENTOLIN HFA) 108 (90 Base) MCG/ACT inhaler Inhale into the lungs every 6 (six) hours as needed for wheezing or shortness of breath.  . Azelastine-Fluticasone (DYMISTA) 137-50 MCG/ACT SUSP Place 1 spray into the nose daily.  . Coenzyme Q10 (CO Q-10 PO) Take 1 capsule by mouth 2 (two) times a week.  . Collagen Hydrolysate POWD by Does not apply route daily.  Marland Kitchen ezetimibe (ZETIA) 10 MG tablet Take 10 mg by mouth daily.   Marland Kitchen FLUoxetine (PROZAC) 10 MG tablet   . folic acid (FOLVITE) 1 MG tablet Take 1 mg by mouth daily.  Marland Kitchen gabapentin (NEURONTIN) 100 MG capsule Take 1 capsule (100 mg total) by mouth 3 (three) times daily. One three times daily  . LORazepam (ATIVAN) 1 MG tablet Take 1 mg by mouth every 8 (eight) hours as needed for anxiety.   Marland Kitchen losartan (COZAAR) 50 MG tablet Take 50 mg by mouth daily.  Marland Kitchen MAGNESIUM PO Take 1 tablet by mouth daily.  . metoprolol (LOPRESSOR) 50 MG tablet Take 25 mg by mouth 2 (two) times daily.   . pantoprazole (PROTONIX) 40 MG tablet   . Probiotic Product (PROBIOTIC PO) Take 1 capsule by mouth daily.  . ranitidine (ZANTAC) 150 MG tablet Take 1 tablet (150 mg total) by mouth at bedtime.  . Vitamin D, Ergocalciferol, (DRISDOL) 50000 UNITS CAPS Take 50,000 Units by mouth every 7 (seven) days.    Marland Kitchen zolpidem (AMBIEN) 5 MG tablet Take 5 mg by mouth at bedtime as needed for sleep.                               Objective:   Physical Exam   amb wf sniffles and throat clearing    10/24/2017        148  09/09/2017         143   08/12/17 146 lb 9.6 oz (66.5  kg)  07/01/17 140 lb (63.5 kg)  05/20/17 139 lb (63 kg)     Vital signs reviewed - Note on arrival 02 sats  97% on RA     HEENT: nl dentition, turbinates bilaterally, and oropharynx which is pristine . Nl external ear canals without cough reflex   NECK :  without JVD/Nodes/TM/ nl carotid upstrokes bilaterally   LUNGS: no acc muscle use,  Nl contour chest which is clear to A and P bilaterally without cough on insp or exp maneuvers   CV:  RRR  no s3 or murmur or increase in P2, and no edema   ABD:  soft and nontender with nl inspiratory excursion in the supine position. No bruits or organomegaly appreciated, bowel sounds nl  MS:  Nl gait/ ext warm without deformities, calf tenderness, cyanosis or clubbing No obvious joint restrictions   SKIN: warm and dry without lesions    NEURO:  alert, approp, nl sensorium with  no motor or cerebellar deficits apparent.               Assessment & Plan:

## 2017-10-24 NOTE — Patient Instructions (Addendum)
Increase gabapentin to 100 mg x 2  With each meal x one week then 3 with each meal until  you use the 100's then change to the  300 mg tablet and take it three times a day   Finish up the dysmista as you plan to see if any nasal symptoms flare off it   Change the   Zantac to where you take it right  after supper and take chlorpheniramine one hour before bedtime   Keep the candy handy - ice chips also work     If not satisfied call for referral to [facility] Dr Carol Ada

## 2017-10-25 ENCOUNTER — Encounter: Payer: Self-pay | Admitting: Internal Medicine

## 2017-10-25 NOTE — Assessment & Plan Note (Signed)
MAXILLOFACIAL CT LTD W/O 04/19/16 Visualized paranasal sinuses are essentially clear. BARIUM SWALLOW/ESOPHAGRAM 04/16/16  Mild gastroesophageal reflux. No stricture. 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    Please see AVS for specific instructions unique to this visit that I personally wrote and verbalized to the the pt in detail and then reviewed with pt  by my nurse highlighting any  changes in therapy recommended at today's visit to their plan of care.

## 2017-11-06 DIAGNOSIS — N952 Postmenopausal atrophic vaginitis: Secondary | ICD-10-CM | POA: Diagnosis not present

## 2017-11-08 DIAGNOSIS — N3010 Interstitial cystitis (chronic) without hematuria: Secondary | ICD-10-CM | POA: Diagnosis not present

## 2017-11-13 ENCOUNTER — Ambulatory Visit (HOSPITAL_COMMUNITY)
Admission: EM | Admit: 2017-11-13 | Discharge: 2017-11-13 | Disposition: A | Discharge status: Home / Self Care | Payer: PPO | Attending: Internal Medicine | Admitting: Internal Medicine

## 2017-11-13 ENCOUNTER — Ambulatory Visit (INDEPENDENT_AMBULATORY_CARE_PROVIDER_SITE_OTHER): Payer: PPO

## 2017-11-13 ENCOUNTER — Encounter (HOSPITAL_COMMUNITY): Payer: Self-pay | Admitting: Emergency Medicine

## 2017-11-13 DIAGNOSIS — M79642 Pain in left hand: Secondary | ICD-10-CM | POA: Diagnosis not present

## 2017-11-13 DIAGNOSIS — S60512A Abrasion of left hand, initial encounter: Secondary | ICD-10-CM | POA: Diagnosis not present

## 2017-11-13 DIAGNOSIS — W19XXXA Unspecified fall, initial encounter: Secondary | ICD-10-CM | POA: Diagnosis not present

## 2017-11-13 DIAGNOSIS — S61217A Laceration without foreign body of left little finger without damage to nail, initial encounter: Secondary | ICD-10-CM | POA: Diagnosis not present

## 2017-11-13 DIAGNOSIS — M25562 Pain in left knee: Secondary | ICD-10-CM | POA: Diagnosis not present

## 2017-11-13 DIAGNOSIS — S8992XA Unspecified injury of left lower leg, initial encounter: Secondary | ICD-10-CM | POA: Diagnosis not present

## 2017-11-13 DIAGNOSIS — S80212A Abrasion, left knee, initial encounter: Secondary | ICD-10-CM

## 2017-11-13 DIAGNOSIS — S6992XA Unspecified injury of left wrist, hand and finger(s), initial encounter: Secondary | ICD-10-CM | POA: Diagnosis not present

## 2017-11-13 MED ORDER — KETOROLAC TROMETHAMINE 60 MG/2ML IM SOLN
60.0000 mg | Freq: Once | INTRAMUSCULAR | Status: AC
  Administered 2017-11-13: 60 mg via INTRAMUSCULAR

## 2017-11-13 MED ORDER — KETOROLAC TROMETHAMINE 60 MG/2ML IM SOLN
INTRAMUSCULAR | Status: AC
  Filled 2017-11-13: qty 2

## 2017-11-13 NOTE — Discharge Instructions (Addendum)
It was nice meeting you!!  I am sorry this happened to you today. Your xray's are all normal which is great. We cleaned your wounds and placed bandages and gave you a shot of Toradol.  You can take tylenol for the pain as needed. Heat or ice to the areas may help.  Follow up with your doctor if symptoms worsen.

## 2017-11-13 NOTE — ED Provider Notes (Signed)
[place]    CSN: [id number] Arrival date & time: 11/13/17  8921     History   Chief Complaint Chief Complaint  Patient presents with  . Fall    HPI Brenda [name] is a 67 y.o. female.   Pt is a 67 year old femae with past medical hx of anemia, anxiety, arthritis, allergies, fibromyalgia, GERD, pancreatitis, HTN, IBS, migraines, and asthma. She is here for a fall that occurred this am. She was on the front porch of her house and was walking with a pot in her hand when her foot caught on the rug and she fell. She landed on the left arm, hand and knee. Denies hitting her head or LOC. She has multiple abrasions to the left hand, left knee. Small laceration to the left pinky finger. She denies any neck, hip or back pain.  Denies any dizziness, headache, nausea.   ROS per HPI        Past Medical History:  Diagnosis Date  . Anal fissure   . Anemia   . Anxiety   . Arthritis   . Bladder pain   . Chronic seasonal allergic rhinitis   . Complication of anesthesia PAIN ATTACKS   LACTATED RINGERS- CONTRAINDICATED  . Depression   . Fibromyalgia   . Frequency of urination   . GERD (gastroesophageal reflux disease)   . Heart murmur   . History of pancreatitis 1988-1989  . Hyperlipidemia   . Hypertension LABILE  . IBS (irritable bowel syndrome)   . Interstitial cystitis   . Migraines   . Moderate persistent asthma    PULMOLOIGST-  DR NESTOR ([place])  . Multiple pulmonary nodules   . Normal cardiac stress test JULY 2009---  NORMAL  . 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 Spasm of sphincter of Oddi 1987  . Urethral stenosis S/P DILATIONS  . Urgency of urination     Patient Active Problem List   Diagnosis Date Noted  . Upper airway cough syndrome 08/12/2017  . Unilateral primary osteoarthritis, left knee 05/20/2017  . Persistent cough   . Moderate persistent asthma 03/19/2016  .  Chronic seasonal allergic rhinitis 03/19/2016  . GERD (gastroesophageal reflux disease) 03/19/2016  . Cough 03/19/2016  . Essential hypertension, benign 03/19/2016  . H/O: CVA (cerebrovascular accident) 03/19/2016  . IBS (irritable bowel syndrome) 03/19/2016  . Interstitial cystitis 03/19/2016  . Fibromyalgia 03/19/2016  . DJD (degenerative joint disease) 03/19/2016  . Arthritis 03/19/2016  . Sphincter of Oddi dysfunction 03/19/2016  . H/O acute pancreatitis 03/19/2016    Past Surgical History:  Procedure Laterality Date  . ABDOMINAL HYSTERECTOMY  1988  . APPENDECTOMY  04/ 1986  . bilateral reduction mastopexy  1988  . bilateral turbinate resection  1990   nasal spreading graft  . CATARACT EXTRACTION W/ INTRAOCULAR LENS  IMPLANT, BILATERAL  08 and 09/ 2017  . CHOLECYSTECTOMY  04/ 1986  . COLONOSCOPY  02/2004, 11/2010   2005: Normal - Dr. Sammuel Cooper 2012: Normal  . CYSTO WITH HYDRODISTENSION N/A 12/17/2013   Procedure: CYSTO HYDRODISTENSION WITH INSTALLATION OF CHLOROPACTIN AND TETRACAINE;  Surgeon: Malka So, MD;  Location[facility];  Service: Urology;  Laterality: N/A;  . CYSTO WITH HYDRODISTENSION N/A 06/02/2015   Procedure: CYSTOSCOPY/HYDRODISTENSION WITH INSTILLATION OF CLORPACTIN ;  Surgeon: [name], MD;  Location[facility];  Service: Urology;  Laterality: N/A;  . CYSTO WITH HYDRODISTENSION N/A 11/08/2016   Procedure: CYSTOSCOPY/HYDRODISTENSION INSTILLATION OF CLORPACTIN;  Surgeon: [name], MD;  Location: [facility];  Service: Urology;  Laterality: N/A;  . CYSTOSCOPY WITH URETHRAL DILATATION  07/12/2011   Procedure: CYSTOSCOPY WITH URETHRAL DILATATION;  Surgeon: Malka So, MD;  Location: [facility];  Service: Urology;  Laterality: N/A;  HOD AND INSTILLATION OF CHLOROPACTIN C-ARM   . CYSTOSCOPY WITH URETHRAL DILATATION N/A 06/26/2012   Procedure: CYSTOSCOPY WITH URETHRAL DILATATION HYDRODISTENSION AND  INSTILLATION OF CLORPACTION ;  Surgeon: Malka So, MD;  Location: [facility];  Service: Urology;  Laterality: N/A;  CYSTOSCOPY WITH URETHRAL DILATATION HYDRODISTENSION AND INSTILLATION OF CLORPACTION   . CYSTOSTOMY W/ BLADDER BIOPSY  1983  . DILATION AND CURETTAGE OF UTERUS  1987  . ERCP  1988   for pancreatitis with stents   S/P SEVERE PANCREATITIS  . HEMORRHOID SURGERY    . LASIK Bilateral APRIL 2005  . MULTIPLE CYSTO/ HOD/ URETHRAL DILATION/ INSTILLATION CLORPACTIN  .LAST ONE 03-31-2010  . NASAL SINUS SURGERY  1977   sinus cyst  . REDUCTION MAMMAPLASTY Bilateral 1988  . revision rhinoplasty  1978  . RHINOPLASTY  1977  . RIGHT SALPINGOOPHECTOMY  1998  . TONSILLECTOMY AND ADENOIDECTOMY  1963  . VIDEO BRONCHOSCOPY Bilateral 08/23/2016   Procedure: VIDEO BRONCHOSCOPY WITHOUT FLUORO;  Surgeon: Javier Glazier, MD;  Location: [facility];  Service: Cardiopulmonary;  Laterality: Bilateral;    OB History   None      Home Medications    Prior to Admission medications   Medication Sig Start Date End Date Taking? Authorizing Provider  acetaminophen (TYLENOL) 325 MG tablet Take 500 mg by mouth every 6 (six) hours as needed (for headaches).     [provider]  albuterol (VENTOLIN HFA) 108 (90 Base) MCG/ACT inhaler Inhale into the lungs every 6 (six) hours as needed for wheezing or shortness of breath.    [provider]  Azelastine-Fluticasone (DYMISTA) 137-50 MCG/ACT SUSP Place 1 spray into the nose daily. 12/20/16   Javier Glazier, MD  Coenzyme Q10 (CO Q-10 PO) Take 1 capsule by mouth 2 (two) times a week.    [provider]  Collagen Hydrolysate POWD by Does not apply route daily.    [provider]  ezetimibe (ZETIA) 10 MG tablet Take 10 mg by mouth daily.     [provider]  FLUoxetine (PROZAC) 10 MG tablet  04/26/17   [provider]  folic acid (FOLVITE) 1 MG tablet Take 1 mg by mouth daily.    [provider]  gabapentin (NEURONTIN) 100 MG capsule Take 1 capsule (100 mg total) by mouth 3 (three) times daily. One three times daily 09/09/17   Tanda Rockers, MD  gabapentin (NEURONTIN) 300 MG capsule Take 1 capsule (300 mg total) by mouth 3 (three) times daily. 10/24/17   Tanda Rockers, MD  LORazepam (ATIVAN) 1 MG tablet Take 1 mg by mouth every 8 (eight) hours as needed for anxiety.     [provider]  losartan (COZAAR) 50 MG tablet Take 50 mg by mouth daily.    [provider]  MAGNESIUM PO Take 1 tablet by mouth daily.    [provider]  metoprolol (LOPRESSOR) 50 MG tablet Take 25 mg by mouth 2 (two) times daily.     [provider]  pantoprazole (PROTONIX) 40 MG tablet  04/14/17   [provider]  Probiotic Product (PROBIOTIC PO) Take 1 capsule by mouth daily.    [provider]  ranitidine (ZANTAC) 150 MG tablet Take 1 tablet (150 mg total) by mouth at bedtime. 05/03/17 05/03/18  Parrett, Fonnie Mu, NP  Vitamin D, Ergocalciferol, (DRISDOL) 50000 UNITS CAPS Take 50,000 Units by mouth every 7 (seven) days.      [provider]  zolpidem (AMBIEN) 5 MG tablet Take 5 mg by mouth at bedtime as needed for sleep.    [provider]    Family History Family History  Problem Relation Age of Onset  . Colon cancer Maternal Grandmother   . Colon cancer Cousin   . Allergies Mother   . Asthma Mother   . Heart disease Mother   . Osteoarthritis Mother   . Diabetes Mother   . Irritable bowel syndrome Mother   . Brain cancer Father   . Osteoarthritis Father   . Colon polyps Father        adenomatous  . Heart disease Father   . Melanoma Sister   . Osteoarthritis Sister   . Irritable bowel syndrome Sister   . Diabetes Sister   . Heart disease Sister   . Stroke Sister   . Ulcerative colitis Maternal Uncle   . Stomach cancer Neg Hx   . Rheumatologic disease Neg Hx     Social History Social History   Tobacco Use  .  Smoking status: Passive Smoke Exposure - Never Smoker  . Smokeless tobacco: Never Used  . Tobacco comment: Father smoked briefly  Substance Use Topics  . Alcohol use: No  . Drug use: No     Allergies   Latex; Adhesive [tape]; Bupivacaine; Butorphanol tartrate; Clarithromycin; Codeine; Cymbalta [duloxetine hcl]; Dilaudid [hydromorphone hcl]; Elavil [amitriptyline hcl]; Hydrocodone; Hyoscyamine sulfate; Lactated ringers; Levbid [hyoscyamine sulfate]; Lisinopril; Marcaine [bupivacaine hcl]; Pentazocine lactate; Percodan [oxycodone-aspirin]; Stadol [butorphanol tartrate]; Trazodone and nefazodone; Vistaril [hydroxyzine hcl]; and Morphine and related   Review of Systems Review of Systems   Physical Exam Triage Vital Signs ED Triage Vitals [11/13/17 0941]  Enc Vitals Group     BP 119/71     Pulse Rate 65     Resp 16     Temp (!) 97.5 F (36.4 C)     Temp Source Oral     SpO2 100 %     Weight      Height      Head Circumference      Peak Flow      Pain Score      Pain Loc      Pain Edu?      Excl. in [place]?    No data found.  Updated Vital Signs BP 119/71 (BP Location: Left Arm)   Pulse 65   Temp (!) 97.5 F (36.4 C) (Oral)   Resp 16   SpO2 100%   Visual Acuity Right Eye Distance:   Left Eye Distance:   Bilateral Distance:    Right Eye Near:   Left Eye Near:    Bilateral Near:     Physical Exam  Constitutional: She is oriented to person, place, and time. She appears well-developed and well-nourished. No distress.  Eyes: Pupils are equal, round, and reactive to light.  Neck: Normal range of motion.  Cardiovascular: Normal rate, regular rhythm and normal heart sounds.  Pulmonary/Chest: Effort normal and breath sounds normal.  Musculoskeletal:  Tender to palpation of left lateral neck and shoulder.  Flexion, extension and rotation of the  neck done without pain or crepitus.  Able to flex, extend, internal and externally rotate the shoulder.    Pain and swelling in  the left hand with mild bruising.  Multiple abrasions to hand and half centimeter superficial laceration to left fifth digit.  Good flexion and extension of fingers and rotation of wrist.  2 abrasions to left knee with minimal swelling.  She is able to flex, extend, internally and externally rotate the knee.  Tender to palpation.     Neurological: She is alert and oriented to person, place, and time.  Skin: Skin is warm and dry. Capillary refill takes less than 2 seconds.  Psychiatric: She has a normal mood and affect.     UC Treatments / Results  Labs (all labs ordered are listed, but only abnormal results are displayed) Labs Reviewed - No data to display  EKG None  Radiology Dg Knee Complete 4 Views Left  Result Date: 11/13/2017 CLINICAL DATA:  Pain following fall EXAM: LEFT KNEE - COMPLETE 4+ VIEW COMPARISON:  June 21, 2016 FINDINGS: Frontal, lateral, and bilateral oblique views were obtained. There is no evident fracture or dislocation. Joint spaces appear normal. There is slight narrowing of the patellofemoral joint and medial compartment. Lateral joint space appears unremarkable. No erosive change. IMPRESSION: Slight narrowing of the patellofemoral joint and medial compartment regions. No fracture or joint effusion. Electronically Signed   By: Lowella Grip III M.D.   On: 11/13/2017 11:04   Dg Hand Complete Left  Result Date: 11/13/2017 CLINICAL DATA:  Pain following fall EXAM: LEFT HAND - COMPLETE 3+ VIEW COMPARISON:  None. FINDINGS: Frontal, oblique, and lateral views were obtained. No fracture or dislocation. Joint spaces appear normal. No erosive change. IMPRESSION: No fracture or dislocation.  No evident arthropathy. Electronically Signed   By: Lowella Grip III M.D.   On: 11/13/2017 11:05    Procedures Procedures (including critical care time)  Medications Ordered in UC Medications  ketorolac (TORADOL) injection 60 mg (60 mg Intramuscular Given 11/13/17 1049)      Initial Impression / Assessment and Plan / UC Course  I have reviewed the triage vital signs and the nursing notes.  Pertinent labs & imaging results that were available during my care of the patient were reviewed by me and considered in my medical decision making (see chart for details).     X-ray of the left hand and left knee were normal.  Cleaned patient's abrasions and bandaged.  Patient is up-to-date on tetanus from 2012.  Gave shot of Toradol in clinic for pain which she reports helped slightly.  Tylenol or ibuprofen for pain at home.  Ice to the hand and knee for relief of swelling and pain.  Follow-up with her PCP if not getting any better.  Return precautions given. Final Clinical Impressions(s) / UC Diagnoses   Final diagnoses:  Fall, initial encounter     Discharge Instructions     It was nice meeting you!!  I am sorry this happened to you today. Your xray's are all normal which is great. We cleaned your wounds and placed bandages and gave you a shot of Toradol.  You can take tylenol for the pain as needed. Heat or ice to the areas may help.  Follow up with your doctor if symptoms worsen.      ED Prescriptions    None     Controlled Substance Prescriptions [place] Controlled Substance Registry consulted? Not Applicable   Orvan July, NP 11/13/17 1159

## 2017-11-13 NOTE — ED Triage Notes (Signed)
Pt with trip and fall this am; pt with abrasion to left knee; and left hand; small laceration to left pinky

## 2017-11-15 ENCOUNTER — Telehealth: Payer: Self-pay | Admitting: Internal Medicine

## 2017-11-15 DIAGNOSIS — R059 Cough, unspecified: Secondary | ICD-10-CM

## 2017-11-15 DIAGNOSIS — R05 Cough: Secondary | ICD-10-CM

## 2017-11-15 NOTE — Telephone Encounter (Signed)
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

## 2017-11-22 DIAGNOSIS — Z23 Encounter for immunization: Secondary | ICD-10-CM | POA: Diagnosis not present

## 2017-11-27 DIAGNOSIS — E668 Other obesity: Secondary | ICD-10-CM | POA: Diagnosis not present

## 2017-11-27 DIAGNOSIS — R05 Cough: Secondary | ICD-10-CM | POA: Diagnosis not present

## 2017-11-27 DIAGNOSIS — Z6830 Body mass index (BMI) 30.0-30.9, adult: Secondary | ICD-10-CM | POA: Diagnosis not present

## 2017-11-27 DIAGNOSIS — I10 Essential (primary) hypertension: Secondary | ICD-10-CM | POA: Diagnosis not present

## 2017-11-27 DIAGNOSIS — K219 Gastro-esophageal reflux disease without esophagitis: Secondary | ICD-10-CM | POA: Diagnosis not present

## 2017-11-27 DIAGNOSIS — R61 Generalized hyperhidrosis: Secondary | ICD-10-CM | POA: Diagnosis not present

## 2017-11-27 DIAGNOSIS — F39 Unspecified mood [affective] disorder: Secondary | ICD-10-CM | POA: Diagnosis not present

## 2017-11-27 DIAGNOSIS — R7301 Impaired fasting glucose: Secondary | ICD-10-CM | POA: Diagnosis not present

## 2017-11-27 DIAGNOSIS — J3089 Other allergic rhinitis: Secondary | ICD-10-CM | POA: Diagnosis not present

## 2017-11-28 ENCOUNTER — Telehealth: Payer: Self-pay | Admitting: Internal Medicine

## 2017-11-28 DIAGNOSIS — J302 Other seasonal allergic rhinitis: Secondary | ICD-10-CM

## 2017-11-28 NOTE — Telephone Encounter (Signed)
Attempted to contact pt. I did not receive an answer. There was no option for me to leave a message. Will try back.  

## 2017-11-29 NOTE — Telephone Encounter (Signed)
Called and spoke with patient requesting to be referred to an ENT Gantt pt that a message will be routed to [facility] for review  MW please advise

## 2017-11-30 NOTE — Telephone Encounter (Signed)
Dr Carol Ada at [facility] - make sure she takes all  Active meds with her including prns/otcs

## 2017-12-02 NOTE — Telephone Encounter (Signed)
Called and spoke with patient regarding ENT referral Placed referral today for Dr Carol Ada at [facility] Pt verbalized understanding, nothing further needed at this time.

## 2017-12-02 NOTE — Telephone Encounter (Signed)
Attempted to call pt. I did not receive an answer. I have left a message for pt to return our call.  

## 2017-12-06 DIAGNOSIS — N3010 Interstitial cystitis (chronic) without hematuria: Secondary | ICD-10-CM | POA: Diagnosis not present

## 2017-12-11 DIAGNOSIS — R490 Dysphonia: Secondary | ICD-10-CM | POA: Diagnosis not present

## 2017-12-11 DIAGNOSIS — R05 Cough: Secondary | ICD-10-CM | POA: Diagnosis not present

## 2017-12-11 DIAGNOSIS — J45901 Unspecified asthma with (acute) exacerbation: Secondary | ICD-10-CM | POA: Diagnosis not present

## 2017-12-11 DIAGNOSIS — R0982 Postnasal drip: Secondary | ICD-10-CM | POA: Diagnosis not present

## 2017-12-11 DIAGNOSIS — J329 Chronic sinusitis, unspecified: Secondary | ICD-10-CM | POA: Diagnosis not present

## 2017-12-11 DIAGNOSIS — J328 Other chronic sinusitis: Secondary | ICD-10-CM | POA: Diagnosis not present

## 2017-12-11 DIAGNOSIS — J301 Allergic rhinitis due to pollen: Secondary | ICD-10-CM | POA: Diagnosis not present

## 2018-01-01 DIAGNOSIS — N3010 Interstitial cystitis (chronic) without hematuria: Secondary | ICD-10-CM | POA: Diagnosis not present

## 2018-01-20 DIAGNOSIS — K219 Gastro-esophageal reflux disease without esophagitis: Secondary | ICD-10-CM | POA: Diagnosis not present

## 2018-01-20 DIAGNOSIS — J384 Edema of larynx: Secondary | ICD-10-CM | POA: Diagnosis not present

## 2018-01-24 ENCOUNTER — Encounter (INDEPENDENT_AMBULATORY_CARE_PROVIDER_SITE_OTHER): Payer: Self-pay | Admitting: Orthopaedic Surgery

## 2018-01-24 ENCOUNTER — Ambulatory Visit (INDEPENDENT_AMBULATORY_CARE_PROVIDER_SITE_OTHER): Payer: PPO | Admitting: Orthopaedic Surgery

## 2018-01-24 VITALS — BP 120/82 | HR 66 | Ht 59.0 in | Wt 148.0 lb

## 2018-01-24 DIAGNOSIS — M1712 Unilateral primary osteoarthritis, left knee: Secondary | ICD-10-CM

## 2018-01-24 MED ORDER — METHYLPREDNISOLONE ACETATE 40 MG/ML IJ SUSP
80.0000 mg | INTRAMUSCULAR | Status: AC | PRN
  Administered 2018-01-24: 80 mg

## 2018-01-24 MED ORDER — LIDOCAINE HCL 1 % IJ SOLN
2.0000 mL | INTRAMUSCULAR | Status: AC | PRN
  Administered 2018-01-24: 2 mL

## 2018-01-24 MED ORDER — BUPIVACAINE HCL 0.5 % IJ SOLN
2.0000 mL | INTRAMUSCULAR | Status: AC | PRN
  Administered 2018-01-24: 2 mL via INTRA_ARTICULAR

## 2018-01-24 NOTE — Progress Notes (Signed)
Office Visit Note   Patient: Brenda [name]           Date of Birth: [birth year]           MRN: [id number] Visit Date: 01/24/2018              Requested by: Prince Solian, MD [address] [place], [place] [address] PCP: Prince Solian, MD   Assessment & Plan: Visit Diagnoses:  1. Unilateral primary osteoarthritis, left knee     Plan: Osteoarthritis left knee.  Prior cortisone injection in March was helpful.  Will reinject left knee with cortisone.  Reevaluate over the next several months if continues to be a problem.  Can always consider Visco supplementation  Follow-Up Instructions: Return if symptoms worsen or fail to improve.   Orders:  Orders Placed This Encounter  Procedures  . Large Joint Inj: L knee   No orders of the defined types were placed in this encounter.     Procedures: Large Joint Inj: L knee on 01/24/2018 12:14 PM Indications: pain and diagnostic evaluation Details: 25 G 1.5 in needle, anteromedial approach  Arthrogram: No  Medications: 2 mL lidocaine 1 %; 2 mL bupivacaine 0.5 %; 80 mg methylPREDNISolone acetate 40 MG/ML Procedure, treatment alternatives, risks and benefits explained, specific risks discussed. Consent was given by the patient. Patient was prepped and draped in the usual sterile fashion.       Clinical Data: No additional findings.   Subjective: Chief Complaint  Patient presents with  . Follow-up    3/209 HAD L KNEE INJECTION HELPED FOR A LITTLE WHILE.  PT FELL ON PORCH 6 WEEKS AGO   Has been followed for the issue with her left knee over long period of time.  She had a Workmen's Comp. claim and is not sure if she has settled that or not.  I last saw her in March of this year and injected cortisone on the medial compartment of her left knee and relates that it made a big difference.  She has had some recent recurrent discomfort.  Pain is localized along the medial aspect of her knee.  She is not sure she is had any swelling.   No numbness or tingling. Prior films and MRI scan demonstrate degenerative changes in all 3 compartments.  There was also a possible tear of the medial  meniscus which I did not think was symptomatic HPI  Review of Systems  Constitutional: Negative for fatigue.  HENT: Negative for ear pain.   Eyes: Negative for pain.  Respiratory: Negative for cough and shortness of breath.   Cardiovascular: Positive for leg swelling.  Gastrointestinal: Negative for constipation and diarrhea.  Genitourinary: Negative for difficulty urinating.  Musculoskeletal: Positive for back pain and neck pain.  Skin: Negative for rash.  Allergic/Immunologic: Negative for food allergies.  Neurological: Positive for weakness and numbness.  Hematological: Does not bruise/bleed easily.  Psychiatric/Behavioral: Negative for sleep disturbance.     Objective: Vital Signs: BP 120/82 (BP Location: Right Arm, Patient Position: Sitting, Cuff Size: Normal)   Pulse 66   Ht 4\' 11"  (1.499 m)   Wt 148 lb (67.1 kg)   BMI 29.89 kg/m   Physical Exam  Constitutional: She is oriented to person, place, and time. She appears well-developed and well-nourished.  HENT:  Mouth/Throat: Oropharynx is clear and moist.  Eyes: Pupils are equal, round, and reactive to light. EOM are normal.  Pulmonary/Chest: Effort normal.  Neurological: She is alert and oriented to person, place, and  time.  Skin: Skin is warm and dry.  Psychiatric: She has a normal mood and affect. Her behavior is normal.    Ortho Exam left knee without effusion.  Mild patellar crepitation with flexion extension.  Mostly medial joint pain more anterior than posterior.  No lateral joint pain.  Negative anterior drawer sign.  No calf pain no distal edema.  Full extension and flexion over 105 degrees without instability.  Straight leg raise negative.  Painless range of motion left hip Specialty Comments:  No specialty comments available.  Imaging: No results  found.   PMFS History: Patient Active Problem List   Diagnosis Date Noted  . Upper airway cough syndrome 08/12/2017  . Unilateral primary osteoarthritis, left knee 05/20/2017  . Persistent cough   . Moderate persistent asthma 03/19/2016  . Chronic seasonal allergic rhinitis 03/19/2016  . GERD (gastroesophageal reflux disease) 03/19/2016  . Cough 03/19/2016  . Essential hypertension, benign 03/19/2016  . H/O: CVA (cerebrovascular accident) 03/19/2016  . IBS (irritable bowel syndrome) 03/19/2016  . Interstitial cystitis 03/19/2016  . Fibromyalgia 03/19/2016  . DJD (degenerative joint disease) 03/19/2016  . Arthritis 03/19/2016  . Sphincter of Oddi dysfunction 03/19/2016  . H/O acute pancreatitis 03/19/2016   Past Medical History:  Diagnosis Date  . Anal fissure   . Anemia   . Anxiety   . Arthritis   . Bladder pain   . Chronic seasonal allergic rhinitis   . Complication of anesthesia PAIN ATTACKS   LACTATED RINGERS- CONTRAINDICATED  . Depression   . Fibromyalgia   . Frequency of urination   . GERD (gastroesophageal reflux disease)   . Heart murmur   . History of pancreatitis 1988-1989  . Hyperlipidemia   . Hypertension LABILE  . IBS (irritable bowel syndrome)   . Interstitial cystitis   . Migraines   . Moderate persistent asthma    PULMOLOIGST-  DR NESTOR ([place])  . Multiple pulmonary nodules   . Normal cardiac stress test JULY 2009---  NORMAL  . 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 Spasm of sphincter of Oddi 1987  . Urethral stenosis S/P DILATIONS  . Urgency of urination     Family History  Problem Relation Age of Onset  . Colon cancer Maternal Grandmother   . Colon cancer Cousin   . Allergies Mother   . Asthma Mother   . Heart disease Mother   . Osteoarthritis Mother   . Diabetes Mother   . Irritable bowel syndrome Mother   . Brain cancer Father   . Osteoarthritis Father   .  Colon polyps Father        adenomatous  . Heart disease Father   . Melanoma Sister   . Osteoarthritis Sister   . Irritable bowel syndrome Sister   . Diabetes Sister   . Heart disease Sister   . Stroke Sister   . Ulcerative colitis Maternal Uncle   . Stomach cancer Neg Hx   . Rheumatologic disease Neg Hx     Past Surgical History:  Procedure Laterality Date  . ABDOMINAL HYSTERECTOMY  1988  . APPENDECTOMY  04/ 1986  . bilateral reduction mastopexy  1988  . bilateral turbinate resection  1990   nasal spreading graft  . CATARACT EXTRACTION W/ INTRAOCULAR LENS  IMPLANT, BILATERAL  08 and 09/ 2017  . CHOLECYSTECTOMY  04/ 1986  . COLONOSCOPY  02/2004, 11/2010  2005: Normal - Dr. Sammuel Cooper 2012: Normal  . CYSTO WITH HYDRODISTENSION N/A 12/17/2013   Procedure: CYSTO HYDRODISTENSION WITH INSTALLATION OF CHLOROPACTIN AND TETRACAINE;  Surgeon: Malka So, MD;  Location[facility];  Service: Urology;  Laterality: N/A;  . CYSTO WITH HYDRODISTENSION N/A 06/02/2015   Procedure: CYSTOSCOPY/HYDRODISTENSION WITH INSTILLATION OF CLORPACTIN ;  Surgeon: [name], MD;  Location: [facility];  Service: Urology;  Laterality: N/A;  . CYSTO WITH HYDRODISTENSION N/A 11/08/2016   Procedure: CYSTOSCOPY/HYDRODISTENSION INSTILLATION OF CLORPACTIN;  Surgeon: [name], MD;  Location: [facility];  Service: Urology;  Laterality: N/A;  . CYSTOSCOPY WITH URETHRAL DILATATION  07/12/2011   Procedure: CYSTOSCOPY WITH URETHRAL DILATATION;  Surgeon: Malka So, MD;  Location: [facility];  Service: Urology;  Laterality: N/A;  HOD AND INSTILLATION OF CHLOROPACTIN C-ARM   . CYSTOSCOPY WITH URETHRAL DILATATION N/A 06/26/2012   Procedure: CYSTOSCOPY WITH URETHRAL DILATATION HYDRODISTENSION AND INSTILLATION OF CLORPACTION ;  Surgeon: Malka So, MD;  Location: [facility];  Service: Urology;  Laterality: N/A;  CYSTOSCOPY WITH URETHRAL DILATATION  HYDRODISTENSION AND INSTILLATION OF CLORPACTION   . CYSTOSTOMY W/ BLADDER BIOPSY  1983  . DILATION AND CURETTAGE OF UTERUS  1987  . ERCP  1988   for pancreatitis with stents   S/P SEVERE PANCREATITIS  . HEMORRHOID SURGERY    . LASIK Bilateral APRIL 2005  . MULTIPLE CYSTO/ HOD/ URETHRAL DILATION/ INSTILLATION CLORPACTIN  .LAST ONE 03-31-2010  . NASAL SINUS SURGERY  1977   sinus cyst  . REDUCTION MAMMAPLASTY Bilateral 1988  . revision rhinoplasty  1978  . RHINOPLASTY  1977  . RIGHT SALPINGOOPHECTOMY  1998  . TONSILLECTOMY AND ADENOIDECTOMY  1963  . VIDEO BRONCHOSCOPY Bilateral 08/23/2016   Procedure: VIDEO BRONCHOSCOPY WITHOUT FLUORO;  Surgeon: Javier Glazier, MD;  Location: [facility];  Service: Cardiopulmonary;  Laterality: Bilateral;   Social History   Occupational History  . Occupation: Therapist, sports  Tobacco Use  . Smoking status: Passive Smoke Exposure - Never Smoker  . Smokeless tobacco: Never Used  . Tobacco comment: Father smoked briefly  Substance and Sexual Activity  . Alcohol use: No  . Drug use: No  . Sexual activity: Not on file

## 2018-01-29 DIAGNOSIS — Z23 Encounter for immunization: Secondary | ICD-10-CM | POA: Diagnosis not present

## 2018-01-31 DIAGNOSIS — N3010 Interstitial cystitis (chronic) without hematuria: Secondary | ICD-10-CM | POA: Diagnosis not present

## 2018-02-06 ENCOUNTER — Other Ambulatory Visit: Payer: Self-pay | Admitting: Urology

## 2018-02-07 DIAGNOSIS — J387 Other diseases of larynx: Secondary | ICD-10-CM | POA: Diagnosis not present

## 2018-02-07 DIAGNOSIS — R05 Cough: Secondary | ICD-10-CM | POA: Diagnosis not present

## 2018-02-07 DIAGNOSIS — R490 Dysphonia: Secondary | ICD-10-CM | POA: Diagnosis not present

## 2018-02-07 DIAGNOSIS — J384 Edema of larynx: Secondary | ICD-10-CM | POA: Diagnosis not present

## 2018-02-07 DIAGNOSIS — K219 Gastro-esophageal reflux disease without esophagitis: Secondary | ICD-10-CM | POA: Diagnosis not present

## 2018-02-24 DIAGNOSIS — Z6829 Body mass index (BMI) 29.0-29.9, adult: Secondary | ICD-10-CM | POA: Diagnosis not present

## 2018-02-24 DIAGNOSIS — Z01419 Encounter for gynecological examination (general) (routine) without abnormal findings: Secondary | ICD-10-CM | POA: Diagnosis not present

## 2018-02-25 DIAGNOSIS — L821 Other seborrheic keratosis: Secondary | ICD-10-CM | POA: Diagnosis not present

## 2018-02-25 DIAGNOSIS — D2271 Melanocytic nevi of right lower limb, including hip: Secondary | ICD-10-CM | POA: Diagnosis not present

## 2018-02-25 DIAGNOSIS — D2272 Melanocytic nevi of left lower limb, including hip: Secondary | ICD-10-CM | POA: Diagnosis not present

## 2018-02-25 DIAGNOSIS — D1801 Hemangioma of skin and subcutaneous tissue: Secondary | ICD-10-CM | POA: Diagnosis not present

## 2018-03-03 ENCOUNTER — Encounter (HOSPITAL_BASED_OUTPATIENT_CLINIC_OR_DEPARTMENT_OTHER): Payer: Self-pay | Admitting: *Deleted

## 2018-03-03 ENCOUNTER — Other Ambulatory Visit: Payer: Self-pay

## 2018-03-03 NOTE — Progress Notes (Signed)
SPOKE FACE TO FACE WITH Brenda [name] NPO AFTER MIDNIGHT PATIENT WISHES TO TAKE THE FOLLOWING MEDS WITH A SIP OF WATER DAY OF SURGERY: PROTONIX, LORAZEPAM, METOPROLOL RECORDS ON CHART/EPIC: CHEST CT 07-29-17, LOV DR NZVJ PULMONARY 08-12-17 DRIVER SPOUSE GRADY HAS SURGERY ORDERS IN EPIC NEEDS I STAT 4 AND EKG

## 2018-03-05 NOTE — H&P (Signed)
CC/HPI: I have interstitial cystitis.     Brenda [name] has a long history of IC and is to have cystoscopy and HOD today.        ALLERGIES: Codeine Derivatives - elevated liver enzymes Dilaudid TABS - Itching Lisinopril TABS - cough Marcaine SOLN - tachycardia and presyncope Morphine Derivatives - Itching, Hives Stadol SOLN - Swelling, tongue swelling and lips numb    MEDICATIONS: Aspirin 81 mg tablet, chewable 1 tablet PO Daily  Metoprolol Tartrate 50 mg tablet 1 tablet PO Daily  Benicar 20 mg tablet 1 tablet PO Daily  Calcium Plus Vitamin D 1 PO Daily  Estradiol 0.1 mg/24 hour patch, transdermal weekly 1 PO Daily  Fish Oil 1 PO Daily  Folic Acid 1 mg tablet 1 tablet PO Daily  Lorazepam 1 mg tablet 1 tablet PO Daily  Probiotic 1 PO Daily  Sertraline Hcl 25 mg tablet  Singulair 10 mg tablet  Ventolin Hfa 1 PO Daily  Vitamin C 1 PO Daily  Vitamin D2 50,000 unit capsule 1 capsule PO Daily  Zetia 10 mg tablet 1 tablet PO Daily     GU PSH: Cysto Bladder Ureth Biopsy - 2012 Cysto Dilate Stricture (M or F) - 2013 Cystoscopy Hydrodistention - 11/08/2016, 2013, 2011, 2010, 2010, 2008 D&C Non-OB - 2012 Hysterectomy Unilat SO - 2012 Ovary Removal Partial or Total - 2012      PSH Notes: Hemorrhoidectomy, Cystoscopy For Urethral Stricture, Bladder Irrigation, Cystoscopy With Dilation Of Bladder, Complete Colonoscopy, Oophorectomy, Corneal LASIK Bilateral, Liver Biopsy, Hysterectomy, Breast Surgery Reduction Procedure, Endoscopic Retrograde Cholangiopancreatography (ERCP), Dilation And Curettage, Breast Surgery Mastopexy Bilateral, Tonsillectomy With Adenoidectomy, Cholecystectomy, Appendectomy, Cystoscopy With Biopsy, Nose Surgery, Nasal Septal Deviation Repair, Bladder Irrigation, Cystoscopy With Dilation Of Bladder, Bladder Irrigation, Cystoscopy With Dilation Of Bladder, Cystoscopy With Dilation Of Bladder, Bladder Irrigation, Cystoscopy With Dilation Of Bladder   NON-GU PSH: Appendectomy -  2012 Breast Reduction - 2012 Cataract surgery, Bilateral Cholecystectomy (open) - 2012 Diagnostic Colonoscopy - 2012 Endo Cholangiopancreatograph - 2012 Hemorrhoidectomy (favorite) - 2015 Needle Biopsy Liver - 2012 Remove Tonsils And Adenoids - 2012 Suspend Breast - 2012    GU PMH: Interstitial Cystitis (w/o hematuria), I will get her set up for a repeat HOD and clorpactin. She is well aware of the risks. - 09/19/2016, Chronic interstitial cystitis without hematuria, - 06/01/2015 Cystocele, midline, Cystocele, midline - 2014 Endometriosis, Unspec, Endometriosis - 2014 Stress Incontinence, Female stress incontinence - 2014 Urethral Stricture, Unspec, Urethral stricture - 2014      PMH Notes:  2010-05-29 17:21:52 - Note: Spasm Of Sphincter Of Oddi  2010-05-29 17:24:06 - Note: Serum Enzyme Levels - ALT (SGPT) Elevated   NON-GU PMH: Cervicalgia, Neck pain, bilateral - 09/06/2015 Radiculopathy, cervical region, Cervical radiculopathy - 09/06/2015 Muscle weakness (generalized), Muscle weakness - 09/02/2015 Other muscle spasm, Muscle spasm - 09/02/2015 Paresthesia of skin, Paresthesia and pain of both upper extremities - 06/01/9796 Follicular disorder, unspecified, Folliculitis - 12/30/1192 Abrasion of unspecified hand, initial encounter, Abrasion of hand - 2015 Acute upper respiratory infection, unspecified, Upper respiratory infection with cough and congestion - 2015 Personal history of other diseases of the respiratory system, History of acute bronchitis - 2015 Vitamin D deficiency, unspecified, Vitamin D deficiency - 2015 Encounter for general adult medical examination without abnormal findings, Encounter for preventive health examination - 2015 Anxiety, Anxiety (Symptom) - 2014 Asthma, Asthma - 2014 Cerebral infarction, unspecified, Stroke Syndrome - 2014 Fibromyalgia, Fibromyalgia - 2014 Irritable bowel syndrome with diarrhea, Irritable Bowel Syndrome - 2014 Personal history  of other diseases  of the circulatory system, History of hypertension - 2014 Personal history of other diseases of the nervous system and sense organs, History of migraine headaches - 2014 Personal history of other endocrine, nutritional and metabolic disease, History of hypercholesterolemia - 2014 Personal history of other specified conditions, History of fibrocystic disease of breast - 2014 Rash and other nonspecific skin eruption, Rash - 2014    FAMILY HISTORY: Acute Myocardial Infarction - Mother Bladder Cancer - Father colonic polyps - Father Congestive Heart Failure - Mother Coronary Artery Disease - Mother Death In The Family Father - Runs In Family Death In The Family Mother - Runs In Family Diabetes - Sister, Mother Family Health Status Number - Runs In Family fibromyalgia - Mother Glioblastoma Multiforme (Grade IV) Of The Brain - Father Heart Disease - Mother Malignant Melanoma Of The Skin - Sister Pure Hypercholesterolemia - Father skin cancer - Mother, Father Stroke Syndrome - Mother Ulcer, Gastric - Mother   SOCIAL HISTORY: Marital Status: Married Preferred Language: English; Ethnicity: Not Hispanic Or Latino; Race: White Current Smoking Status: Patient has never smoked.  Has never drank.  Does not use drugs. Drinks 1 caffeinated drink per day. 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     Notes: Exercise habits, Activities of daily living (ADL's), requires assistance, Occupation:, Never A Smoker, Caffeine Use, Tobacco Use, Alcohol Use, Marital History - Currently Married, no children   REVIEW OF SYSTEMS:    GU Review Female:   bladder pain. Patient reports frequent urination. Patient denies hard to postpone urination, burning /pain with urination, get up at night to urinate, leakage of urine, stream starts and stops, trouble starting your stream, have to strain to urinate, and being pregnant.  Gastrointestinal (Upper):   Patient denies nausea, vomiting, and indigestion/  heartburn.  Gastrointestinal (Lower):   Patient denies diarrhea and constipation.  Constitutional:   Patient denies fever, night sweats, weight loss, and fatigue.  Skin:   Patient denies skin rash/ lesion and itching.  Eyes:   Patient denies blurred vision and double vision.  Ears/ Nose/ Throat:   Patient denies sore throat and sinus problems.  Hematologic/Lymphatic:   Patient denies swollen glands and easy bruising.  Cardiovascular:   Patient denies leg swelling and chest pains.  Respiratory:   Patient denies cough and shortness of breath.  Endocrine:   Patient denies excessive thirst.  Musculoskeletal:   Patient denies back pain and joint pain.  Neurological:   Patient denies headaches and dizziness.  Psychologic:   Patient denies depression and anxiety.   VITAL SIGNS: None   Gen: WD, WN in NAD Lungs: CTA. CV: RRR.   ASSESSMENT:      ICD-10 Details  1 GU:   Interstitial Cystitis (w/o hematuria) - N30.10   2   Urinary Frequency - R35.0      PLAN:          Cystoscopy with HOD.  Risks reviewed.

## 2018-03-06 ENCOUNTER — Ambulatory Visit (HOSPITAL_BASED_OUTPATIENT_CLINIC_OR_DEPARTMENT_OTHER): Payer: PPO | Admitting: Anesthesiology

## 2018-03-06 ENCOUNTER — Encounter (HOSPITAL_BASED_OUTPATIENT_CLINIC_OR_DEPARTMENT_OTHER)
Admission: RE | Disposition: A | Discharge status: Home / Self Care | Payer: Self-pay | Source: Ambulatory Visit | Attending: Urology

## 2018-03-06 ENCOUNTER — Encounter (HOSPITAL_BASED_OUTPATIENT_CLINIC_OR_DEPARTMENT_OTHER): Payer: Self-pay | Admitting: *Deleted

## 2018-03-06 ENCOUNTER — Ambulatory Visit (HOSPITAL_BASED_OUTPATIENT_CLINIC_OR_DEPARTMENT_OTHER)
Admission: RE | Admit: 2018-03-06 | Discharge: 2018-03-06 | Disposition: A | Discharge status: Home / Self Care | Payer: PPO | Source: Ambulatory Visit | Attending: Urology | Admitting: Urology

## 2018-03-06 DIAGNOSIS — N8110 Cystocele, unspecified: Secondary | ICD-10-CM | POA: Insufficient documentation

## 2018-03-06 DIAGNOSIS — Z888 Allergy status to other drugs, medicaments and biological substances status: Secondary | ICD-10-CM | POA: Insufficient documentation

## 2018-03-06 DIAGNOSIS — Z8379 Family history of other diseases of the digestive system: Secondary | ICD-10-CM | POA: Diagnosis not present

## 2018-03-06 DIAGNOSIS — N3010 Interstitial cystitis (chronic) without hematuria: Secondary | ICD-10-CM | POA: Insufficient documentation

## 2018-03-06 DIAGNOSIS — Z8371 Family history of colonic polyps: Secondary | ICD-10-CM | POA: Diagnosis not present

## 2018-03-06 DIAGNOSIS — Z823 Family history of stroke: Secondary | ICD-10-CM | POA: Diagnosis not present

## 2018-03-06 DIAGNOSIS — Z90710 Acquired absence of both cervix and uterus: Secondary | ICD-10-CM | POA: Insufficient documentation

## 2018-03-06 DIAGNOSIS — Z9842 Cataract extraction status, left eye: Secondary | ICD-10-CM | POA: Diagnosis not present

## 2018-03-06 DIAGNOSIS — G709 Myoneural disorder, unspecified: Secondary | ICD-10-CM | POA: Diagnosis not present

## 2018-03-06 DIAGNOSIS — J45909 Unspecified asthma, uncomplicated: Secondary | ICD-10-CM | POA: Insufficient documentation

## 2018-03-06 DIAGNOSIS — Z9841 Cataract extraction status, right eye: Secondary | ICD-10-CM | POA: Diagnosis not present

## 2018-03-06 DIAGNOSIS — Z820 Family history of epilepsy and other diseases of the nervous system: Secondary | ICD-10-CM | POA: Diagnosis not present

## 2018-03-06 DIAGNOSIS — N393 Stress incontinence (female) (male): Secondary | ICD-10-CM | POA: Insufficient documentation

## 2018-03-06 DIAGNOSIS — Z808 Family history of malignant neoplasm of other organs or systems: Secondary | ICD-10-CM | POA: Insufficient documentation

## 2018-03-06 DIAGNOSIS — I10 Essential (primary) hypertension: Secondary | ICD-10-CM | POA: Diagnosis not present

## 2018-03-06 DIAGNOSIS — M5412 Radiculopathy, cervical region: Secondary | ICD-10-CM | POA: Insufficient documentation

## 2018-03-06 DIAGNOSIS — Z79899 Other long term (current) drug therapy: Secondary | ICD-10-CM | POA: Insufficient documentation

## 2018-03-06 DIAGNOSIS — M1990 Unspecified osteoarthritis, unspecified site: Secondary | ICD-10-CM | POA: Diagnosis not present

## 2018-03-06 DIAGNOSIS — M797 Fibromyalgia: Secondary | ICD-10-CM | POA: Insufficient documentation

## 2018-03-06 DIAGNOSIS — Z9049 Acquired absence of other specified parts of digestive tract: Secondary | ICD-10-CM | POA: Insufficient documentation

## 2018-03-06 DIAGNOSIS — E785 Hyperlipidemia, unspecified: Secondary | ICD-10-CM | POA: Diagnosis not present

## 2018-03-06 DIAGNOSIS — E7800 Pure hypercholesterolemia, unspecified: Secondary | ICD-10-CM | POA: Insufficient documentation

## 2018-03-06 DIAGNOSIS — F329 Major depressive disorder, single episode, unspecified: Secondary | ICD-10-CM | POA: Insufficient documentation

## 2018-03-06 DIAGNOSIS — Z8673 Personal history of transient ischemic attack (TIA), and cerebral infarction without residual deficits: Secondary | ICD-10-CM | POA: Insufficient documentation

## 2018-03-06 DIAGNOSIS — G43909 Migraine, unspecified, not intractable, without status migrainosus: Secondary | ICD-10-CM | POA: Insufficient documentation

## 2018-03-06 DIAGNOSIS — F419 Anxiety disorder, unspecified: Secondary | ICD-10-CM | POA: Insufficient documentation

## 2018-03-06 DIAGNOSIS — K219 Gastro-esophageal reflux disease without esophagitis: Secondary | ICD-10-CM | POA: Diagnosis not present

## 2018-03-06 DIAGNOSIS — Z833 Family history of diabetes mellitus: Secondary | ICD-10-CM | POA: Diagnosis not present

## 2018-03-06 DIAGNOSIS — K589 Irritable bowel syndrome without diarrhea: Secondary | ICD-10-CM | POA: Insufficient documentation

## 2018-03-06 DIAGNOSIS — Z8052 Family history of malignant neoplasm of bladder: Secondary | ICD-10-CM | POA: Insufficient documentation

## 2018-03-06 DIAGNOSIS — Z8249 Family history of ischemic heart disease and other diseases of the circulatory system: Secondary | ICD-10-CM | POA: Diagnosis not present

## 2018-03-06 DIAGNOSIS — E559 Vitamin D deficiency, unspecified: Secondary | ICD-10-CM | POA: Insufficient documentation

## 2018-03-06 HISTORY — DX: Other specified postprocedural states: Z98.890

## 2018-03-06 HISTORY — PX: CYSTO WITH HYDRODISTENSION: SHX5453

## 2018-03-06 HISTORY — DX: Cervical disc disorder, unspecified, unspecified cervical region: M50.90

## 2018-03-06 HISTORY — DX: Spondylosis without myelopathy or radiculopathy, lumbar region: M47.816

## 2018-03-06 HISTORY — DX: Other specified postprocedural states: R11.2

## 2018-03-06 LAB — POCT I-STAT 4, (NA,K, GLUC, HGB,HCT)
Glucose, Bld: 95 mg/dL (ref 70–99)
HCT: 41 % (ref 36.0–46.0)
Hemoglobin: 13.9 g/dL (ref 12.0–15.0)
Potassium: 4.3 mmol/L (ref 3.5–5.1)
Sodium: 141 mmol/L (ref 135–145)

## 2018-03-06 SURGERY — CYSTOSCOPY, WITH BLADDER HYDRODISTENSION
Anesthesia: Monitor Anesthesia Care

## 2018-03-06 MED ORDER — LACTATED RINGERS IV SOLN
INTRAVENOUS | Status: DC
  Filled 2018-03-06: qty 1000

## 2018-03-06 MED ORDER — SODIUM CHLORIDE 0.9 % IV SOLN
250.0000 mL | INTRAVENOUS | Status: DC | PRN
  Filled 2018-03-06: qty 250

## 2018-03-06 MED ORDER — ALBUTEROL SULFATE HFA 108 (90 BASE) MCG/ACT IN AERS
INHALATION_SPRAY | RESPIRATORY_TRACT | Status: DC | PRN
  Administered 2018-03-06: 2 via RESPIRATORY_TRACT

## 2018-03-06 MED ORDER — MEPERIDINE HCL 50 MG/ML IJ SOLN
50.0000 mg | Freq: Once | INTRAMUSCULAR | Status: AC
  Administered 2018-03-06: 50 mg via INTRAMUSCULAR
  Filled 2018-03-06: qty 1

## 2018-03-06 MED ORDER — PROPOFOL 500 MG/50ML IV EMUL
INTRAVENOUS | Status: AC
  Filled 2018-03-06: qty 50

## 2018-03-06 MED ORDER — PROPOFOL 10 MG/ML IV BOLUS
INTRAVENOUS | Status: DC | PRN
  Administered 2018-03-06 (×2): 50 mg via INTRAVENOUS

## 2018-03-06 MED ORDER — MIDAZOLAM HCL 2 MG/2ML IJ SOLN
INTRAMUSCULAR | Status: AC
  Filled 2018-03-06: qty 2

## 2018-03-06 MED ORDER — PROMETHAZINE HCL 25 MG/ML IJ SOLN
6.2500 mg | INTRAMUSCULAR | Status: DC | PRN
  Filled 2018-03-06: qty 1

## 2018-03-06 MED ORDER — PROPOFOL 500 MG/50ML IV EMUL
INTRAVENOUS | Status: DC | PRN
  Administered 2018-03-06: 25 ug/kg/min via INTRAVENOUS

## 2018-03-06 MED ORDER — SODIUM CHLORIDE 0.9% FLUSH
3.0000 mL | INTRAVENOUS | Status: DC | PRN
  Filled 2018-03-06: qty 3

## 2018-03-06 MED ORDER — MEPERIDINE HCL 25 MG/ML IJ SOLN
INTRAMUSCULAR | Status: AC
  Filled 2018-03-06: qty 2

## 2018-03-06 MED ORDER — SODIUM CHLORIDE 0.9 % IV SOLN
INTRAVENOUS | Status: DC
  Administered 2018-03-06: 07:00:00 via INTRAVENOUS
  Filled 2018-03-06: qty 1000

## 2018-03-06 MED ORDER — OXYCHLOROSENE SODIUM POWD
Status: DC | PRN
  Administered 2018-03-06: 1

## 2018-03-06 MED ORDER — FENTANYL CITRATE (PF) 100 MCG/2ML IJ SOLN
25.0000 ug | INTRAMUSCULAR | Status: DC | PRN
  Administered 2018-03-06: 25 ug via INTRAVENOUS
  Filled 2018-03-06: qty 1

## 2018-03-06 MED ORDER — PROMETHAZINE HCL 25 MG/ML IJ SOLN
25.0000 mg | Freq: Once | INTRAMUSCULAR | Status: AC
  Administered 2018-03-06: 25 mg via INTRAMUSCULAR
  Filled 2018-03-06: qty 1

## 2018-03-06 MED ORDER — FENTANYL CITRATE (PF) 100 MCG/2ML IJ SOLN
INTRAMUSCULAR | Status: AC
  Filled 2018-03-06: qty 2

## 2018-03-06 MED ORDER — ACETAMINOPHEN 650 MG RE SUPP
650.0000 mg | RECTAL | Status: DC | PRN
  Filled 2018-03-06: qty 1

## 2018-03-06 MED ORDER — CIPROFLOXACIN IN D5W 400 MG/200ML IV SOLN
INTRAVENOUS | Status: AC
  Filled 2018-03-06: qty 200

## 2018-03-06 MED ORDER — CIPROFLOXACIN IN D5W 400 MG/200ML IV SOLN
400.0000 mg | INTRAVENOUS | Status: AC
  Administered 2018-03-06: 400 mg via INTRAVENOUS
  Filled 2018-03-06: qty 200

## 2018-03-06 MED ORDER — PROMETHAZINE HCL 25 MG/ML IJ SOLN
INTRAMUSCULAR | Status: AC
  Filled 2018-03-06: qty 1

## 2018-03-06 MED ORDER — SODIUM CHLORIDE 0.9% FLUSH
3.0000 mL | Freq: Two times a day (BID) | INTRAVENOUS | Status: DC
  Filled 2018-03-06: qty 3

## 2018-03-06 MED ORDER — MIDAZOLAM HCL 5 MG/5ML IJ SOLN
INTRAMUSCULAR | Status: DC | PRN
  Administered 2018-03-06: 2 mg via INTRAVENOUS

## 2018-03-06 MED ORDER — MEPERIDINE HCL 25 MG/ML IJ SOLN
6.2500 mg | INTRAMUSCULAR | Status: DC | PRN
  Filled 2018-03-06: qty 1

## 2018-03-06 MED ORDER — FENTANYL CITRATE (PF) 100 MCG/2ML IJ SOLN
INTRAMUSCULAR | Status: DC | PRN
  Administered 2018-03-06 (×2): 50 ug via INTRAVENOUS

## 2018-03-06 MED ORDER — ACETAMINOPHEN 325 MG PO TABS
650.0000 mg | ORAL_TABLET | ORAL | Status: DC | PRN
  Filled 2018-03-06: qty 2

## 2018-03-06 MED ORDER — LIDOCAINE 2% (20 MG/ML) 5 ML SYRINGE
INTRAMUSCULAR | Status: AC
  Filled 2018-03-06: qty 5

## 2018-03-06 MED ORDER — NON FORMULARY
Status: DC | PRN
  Administered 2018-03-06: 30 mL

## 2018-03-06 MED ORDER — LIDOCAINE 2% (20 MG/ML) 5 ML SYRINGE
INTRAMUSCULAR | Status: DC | PRN
  Administered 2018-03-06: 50 mg via INTRAVENOUS

## 2018-03-06 SURGICAL SUPPLY — 17 items
BAG DRAIN URO-CYSTO SKYTR STRL (DRAIN) ×2 IMPLANT
BAG DRN UROCATH (DRAIN) ×1
CATH ROBINSON RED A/P 16FR (CATHETERS) ×2 IMPLANT
CLOTH BEACON ORANGE TIMEOUT ST (SAFETY) ×2 IMPLANT
ELECT REM PT RETURN 9FT ADLT (ELECTROSURGICAL) ×2
ELECTRODE REM PT RTRN 9FT ADLT (ELECTROSURGICAL) ×1 IMPLANT
GLOVE SURG SS PI 8.0 STRL IVOR (GLOVE) ×2 IMPLANT
GOWN STRL REUS W/TWL XL LVL3 (GOWN DISPOSABLE) ×2 IMPLANT
KIT TURNOVER CYSTO (KITS) ×2 IMPLANT
MANIFOLD NEPTUNE II (INSTRUMENTS) IMPLANT
NDL SAFETY ECLIPSE 18X1.5 (NEEDLE) ×1 IMPLANT
NEEDLE HYPO 18GX1.5 SHARP (NEEDLE) ×2
NS IRRIG 500ML POUR BTL (IV SOLUTION) IMPLANT
PACK CYSTO (CUSTOM PROCEDURE TRAY) ×2 IMPLANT
SYR 30ML LL (SYRINGE) ×2 IMPLANT
TUBE CONNECTING 12X1/4 (SUCTIONS) IMPLANT
WATER STERILE IRR 3000ML UROMA (IV SOLUTION) ×2 IMPLANT

## 2018-03-06 NOTE — Anesthesia Procedure Notes (Addendum)
Procedure Name: MAC Date/Time: 03/06/2018 7:37 AM Performed by: Bonney Aid, CRNA Pre-anesthesia Checklist: Patient identified, Timeout performed, Emergency Drugs available, Suction available and Patient being monitored Patient Re-evaluated:Patient Re-evaluated prior to induction Oxygen Delivery Method: Simple face mask Placement Confirmation: positive ETCO2

## 2018-03-06 NOTE — Discharge Instructions (Addendum)
CYSTOSCOPY HOME CARE INSTRUCTIONS  Activity: Rest for the remainder of the day.  Do not drive or operate equipment today.  You may resume normal activities in one to two days as instructed by your physician.   Meals: Drink plenty of liquids and eat light foods such as gelatin or soup this evening.  You may return to a normal meal plan tomorrow.  Return to Work: You may return to work in one to two days or as instructed by your physician.  Special Instructions / Symptoms: Call your physician if any of these symptoms occur:   -persistent or heavy bleeding  -bleeding which continues after first few urination  -large blood clots that are difficult to pass  -urine stream diminishes or stops completely  -fever equal to or higher than 101 degrees Farenheit.  -cloudy urine with a strong, foul odor  -severe pain  Females should always wipe from front to back after elimination.  You may feel some burning pain when you urinate.  This should disappear with time.  Applying moist heat to the lower abdomen or a hot tub bath may help relieve the pain. \     Post Anesthesia Home Care Instructions  Activity: Get plenty of rest for the remainder of the day. A responsible individual must stay with you for 24 hours following the procedure.  For the next 24 hours, DO NOT: -Drive a car -Operate machinery -Drink alcoholic beverages -Take any medication unless instructed by your physician -Make any legal decisions or sign important papers.  Meals: Start with liquid foods such as gelatin or soup. Progress to regular foods as tolerated. Avoid greasy, spicy, heavy foods. If nausea and/or vomiting occur, drink only clear liquids until the nausea and/or vomiting subsides. Call your physician if vomiting continues.  Special Instructions/Symptoms: Your throat may feel dry or sore from the anesthesia or the breathing tube placed in your throat during surgery. If this causes discomfort, gargle with warm salt  water. The discomfort should disappear within 24 hours.  If you had a scopolamine patch placed behind your ear for the management of post- operative nausea and/or vomiting:  1. The medication in the patch is effective for 72 hours, after which it should be removed.  Wrap patch in a tissue and discard in the trash. Wash hands thoroughly with soap and water. 2. You may remove the patch earlier than 72 hours if you experience unpleasant side effects which may include dry mouth, dizziness or visual disturbances. 3. Avoid touching the patch. Wash your hands with soap and water after contact with the patch.     

## 2018-03-06 NOTE — Anesthesia Postprocedure Evaluation (Signed)
Anesthesia Post Note  Patient: Brenda [name]  Procedure(s) Performed: CYSTOSCOPY/HYDRODISTENSION INSTILL CHLORPACTION (N/A )     Patient location during evaluation: PACU Anesthesia Type: MAC Level of consciousness: awake and alert Pain management: pain level controlled Vital Signs Assessment: post-procedure vital signs reviewed and stable Respiratory status: spontaneous breathing, nonlabored ventilation, respiratory function stable and patient connected to nasal cannula oxygen Cardiovascular status: stable and blood pressure returned to baseline Postop Assessment: no apparent nausea or vomiting Anesthetic complications: no    Last Vitals:  Vitals:   03/06/18 0900 03/06/18 1000  BP: 119/87 (!) 148/92  Pulse: (!) 54 (!) 56  Resp: 13 12  Temp:    SpO2: 97% 100%    Last Pain:  Vitals:   03/06/18 1000  TempSrc:   PainSc: Jobos Adylene Dlugosz

## 2018-03-06 NOTE — Transfer of Care (Signed)
Immediate Anesthesia Transfer of Care Note  Patient: Brenda [name]  Procedure(s) Performed: CYSTOSCOPY/HYDRODISTENSION INSTILL CHLORPACTION (N/A )  Patient Location: PACU  Anesthesia Type:MAC  Level of Consciousness: drowsy  Airway & Oxygen Therapy: Patient Spontanous Breathing and Patient connected to face mask oxygen  Post-op Assessment: Report given to RN  Post vital signs: Reviewed and stable  Last Vitals:  Vitals Value Taken Time  BP 128/84 03/06/2018  7:57 AM  Temp    Pulse 63 03/06/2018  7:59 AM  Resp 14 03/06/2018  7:59 AM  SpO2 100 % 03/06/2018  7:59 AM  Vitals shown include unvalidated device data.  Last Pain:  Vitals:   03/06/18 0555  TempSrc: Oral         Complications: No apparent anesthesia complications

## 2018-03-06 NOTE — Op Note (Signed)
PROCEDURE:  Cystoscopy with hydrodistention of the bladder, instillation of Clorpactin and tetracaine.  PREOPERATIVE DIAGNOSIS:  Interstitial cystitis.  POSTOPERATIVE DIAGNOSIS:  Interstitial cystitis.  SURGEON:  Marshall Cork. Jeffie Pollock, M.D.  ANESTHESIA:  General.  SPECIMEN:  None.  DRAINS:  A 16-French silastic Foley catheter.  BLOOD LOSS:  None.  COMPLICATIONS:  None.  INDICATIONS:  Brenda [name] is a 67 year old white female with a long history of interstitial cystitis, who requires intermittent hydrodistention of the bladder for symptom relief.  FINDINGS AND PROCEDURE:  She was taken to the operating room where she was given antibiotic.  A MAC was induced.  She was placed in lithotomy position.  Her perineum and genitalia were prepped with Betadine solution and she was draped in usual sterile fashion.  Cystoscopy was performed using a 23-French scope and 30-degree lens. Examination revealed a normal urethra.  The bladder wall had mild trabeculation.  There was minimal mucosal erythema with some increased vascularity adjacent to the ureteral orifice which were in the normal anatomic position effluxing clear urine.  After thorough inspection, this revealed no tumors or stones.  The bladder was filled under 80 cm water pressure to capacity and then drained.  Her capacity under anesthesia was 500 mL.  Cystoscopy following hydrodistention demonstrated diffuse glomerular hemorrhages with mucosal bleeding consistent with interstitial cystitis.  The cystoscope was then removed and a 16-French silastic Foley catheter was inserted.  The balloon was filled with 10 mL sterile fluid.  The bladder was then filled with approximately 180 mL of Clorpactin solution 1 ampule and 1 L of water that was left indwelling for 2 minutes, then drained.  The bladder was then instilled with 30 mL of tetracaine solution.  The catheter was plugged.  The patient's anesthetic was reversed.  She  was moved to recovery room where the catheter was placed to straight drainage.  There were no complications.

## 2018-03-06 NOTE — Anesthesia Preprocedure Evaluation (Addendum)
Anesthesia Evaluation  Patient identified by MRN, date of birth, ID band Patient awake    Reviewed: Allergy & Precautions, NPO status , Patient's Chart, lab work & pertinent test results  History of Anesthesia Complications (+) PONV  Airway Mallampati: II  TM Distance: >3 FB Neck ROM: Full    Dental  (+) Caps, Dental Advisory Given,    Pulmonary asthma ,    breath sounds clear to auscultation       Cardiovascular hypertension, Pt. on medications and Pt. on home beta blockers + Valvular Problems/Murmurs  Rhythm:Regular Rate:Normal     Neuro/Psych  Headaches, Anxiety Depression  Neuromuscular disease    GI/Hepatic Neg liver ROS, GERD  Medicated,  Endo/Other  negative endocrine ROS  Renal/GU negative Renal ROS     Musculoskeletal  (+) Arthritis , Fibromyalgia -  Abdominal Normal abdominal exam  (+)   Peds  Hematology   Anesthesia Other Findings - HLD  Reproductive/Obstetrics                            Anesthesia Physical Anesthesia Plan  ASA: II  Anesthesia Plan: MAC   Post-op Pain Management:    Induction: Intravenous  PONV Risk Score and Plan: 4 or greater and Ondansetron, Dexamethasone, Midazolam and Propofol infusion  Airway Management Planned: Natural Airway and Simple Face Mask  Additional Equipment: None  Intra-op Plan:   Post-operative Plan: Extubation in OR  Informed Consent: I have reviewed the patients History and Physical, chart, labs and discussed the procedure including the risks, benefits and alternatives for the proposed anesthesia with the patient or authorized representative who has indicated his/her understanding and acceptance.   Dental advisory given  Plan Discussed with: CRNA  Anesthesia Plan Comments:       Anesthesia Quick Evaluation

## 2018-03-06 NOTE — Interval H&P Note (Signed)
History and Physical Interval Note:  03/06/2018 7:17 AM  Brenda [name]  has presented today for surgery, with the diagnosis of INTERSTITIAL CYSTITIS  The various methods of treatment have been discussed with the patient and family. After consideration of risks, benefits and other options for treatment, the patient has consented to  Procedure(s): CYSTOSCOPY/HYDRODISTENSION INSTILL CHLORPACTION (N/A) as a surgical intervention .  The patient's history has been reviewed, patient examined, no change in status, stable for surgery.  I have reviewed the patient's chart and labs.  Questions were answered to the patient's satisfaction.     [name]

## 2018-03-07 ENCOUNTER — Encounter (HOSPITAL_BASED_OUTPATIENT_CLINIC_OR_DEPARTMENT_OTHER): Payer: Self-pay | Admitting: Urology

## 2018-03-13 ENCOUNTER — Encounter: Payer: Self-pay | Admitting: Emergency Medicine

## 2018-03-13 DIAGNOSIS — F4001 Agoraphobia with panic disorder: Secondary | ICD-10-CM

## 2018-03-13 DIAGNOSIS — F32A Depression, unspecified: Secondary | ICD-10-CM | POA: Insufficient documentation

## 2018-03-13 DIAGNOSIS — F411 Generalized anxiety disorder: Secondary | ICD-10-CM | POA: Insufficient documentation

## 2018-03-13 DIAGNOSIS — F320 Major depressive disorder, single episode, mild: Secondary | ICD-10-CM | POA: Insufficient documentation

## 2018-03-20 DIAGNOSIS — Z6829 Body mass index (BMI) 29.0-29.9, adult: Secondary | ICD-10-CM | POA: Diagnosis not present

## 2018-03-20 DIAGNOSIS — R05 Cough: Secondary | ICD-10-CM | POA: Diagnosis not present

## 2018-03-20 DIAGNOSIS — J209 Acute bronchitis, unspecified: Secondary | ICD-10-CM | POA: Diagnosis not present

## 2018-03-20 DIAGNOSIS — J309 Allergic rhinitis, unspecified: Secondary | ICD-10-CM | POA: Diagnosis not present

## 2018-03-20 DIAGNOSIS — I10 Essential (primary) hypertension: Secondary | ICD-10-CM | POA: Diagnosis not present

## 2018-03-31 ENCOUNTER — Ambulatory Visit: Payer: PPO | Admitting: Psychiatry

## 2018-03-31 ENCOUNTER — Encounter: Payer: Self-pay | Admitting: Psychiatry

## 2018-03-31 DIAGNOSIS — F4001 Agoraphobia with panic disorder: Secondary | ICD-10-CM | POA: Diagnosis not present

## 2018-03-31 DIAGNOSIS — F411 Generalized anxiety disorder: Secondary | ICD-10-CM

## 2018-03-31 DIAGNOSIS — F330 Major depressive disorder, recurrent, mild: Secondary | ICD-10-CM

## 2018-03-31 NOTE — Progress Notes (Addendum)
Brenda [name] [id number] 1950-10-16 67 y.o.  Subjective:   Patient ID:  Brenda [name] is a 67 y.o. (DOB [birth year]) female.  Chief Complaint:  Chief Complaint  Patient presents with  . Anxiety  . Sleeping Problem    hot flashes and NM    HPI [name] presents to the office today for follow-up of anxiety and poor sleep.  NM and hot flashes.  Dreams sometimes awaken her.  Hot flashes awaken her nightly and has them daytime too.  PCP rec Effexor for hot flashes but she tried it in the past and didn't tolerate it.  Multiple med intolerances.  More depressed with no family left and no kids.  Trying to sell parents property but ambivalent.  Retired but works 2 days/week.  Depressed some at other times of the year but worse at the holidays.  Wonders about med change but fears weight gain.  She is had multiple med intolerances including venlafaxine which caused vision problems and mental fogginess, Trintellix, duloxetine was blurred vision, paroxetine, buspirone, sertraline, and Lexapro.  She has a history of side effects from trazodone, gabapentin, Valium which caused nightmares, Tranxene which caused tachycardia.  All these meds that she did not tolerate we tried low dosages.   Review of Systems:  Review of Systems  Respiratory: Negative for shortness of breath.   Neurological: Negative for tremors and weakness.  Psychiatric/Behavioral: Positive for dysphoric mood and sleep disturbance. Negative for agitation, behavioral problems, confusion, decreased concentration, hallucinations, self-injury and suicidal ideas. The patient is nervous/anxious. The patient is not hyperactive.     Medications: I have reviewed the patient's current medications.  Current Outpatient Medications  Medication Sig Dispense Refill  . acetaminophen (TYLENOL) 325 MG tablet Take 500 mg by mouth every 6 (six) hours as needed (for headaches).     Marland Kitchen albuterol (VENTOLIN HFA) 108 (90 Base) MCG/ACT inhaler  Inhale into the lungs every 6 (six) hours as needed for wheezing or shortness of breath.    . ezetimibe (ZETIA) 10 MG tablet Take 10 mg by mouth every evening.     . famotidine (PEPCID) 20 MG tablet Take 20 mg by mouth at bedtime.    Marland Kitchen FLUoxetine (PROZAC) 10 MG tablet     . fluticasone (FLONASE) 50 MCG/ACT nasal spray Place 2 sprays into both nostrils daily.    . folic acid (FOLVITE) 1 MG tablet Take 1 mg by mouth daily.    Marland Kitchen LORazepam (ATIVAN) 1 MG tablet Take 1 mg by mouth daily.     Marland Kitchen losartan (COZAAR) 50 MG tablet Take 50 mg by mouth daily.    Marland Kitchen MAGNESIUM PO Take 1 tablet by mouth as needed.     . metoprolol (LOPRESSOR) 50 MG tablet Take 25 mg by mouth 2 (two) times daily.     . Multiple Vitamins-Minerals (HAIR SKIN AND NAILS FORMULA PO) Take by mouth as needed.    . pantoprazole (PROTONIX) 40 MG tablet 40 mg.     . Probiotic Product (PROBIOTIC PO) Take 1 capsule by mouth every evening.     Marland Kitchen UNABLE TO FIND CALCUIM WITH VITAMIN D GUMMIES BID    . Vitamin D, Ergocalciferol, (DRISDOL) 50000 UNITS CAPS Take 50,000 Units by mouth every 7 (seven) days.      Marland Kitchen zolpidem (AMBIEN) 5 MG tablet Take 5 mg by mouth at bedtime as needed for sleep.     No current facility-administered medications for this visit.     Medication Side Effects: Other:  vivid dreams  Allergies:  Allergies  Allergen Reactions  . Latex Rash  . Adhesive [Tape] Other (See Comments)    TEARS SKIN; PLEASE USE COBAN WRAP!!  . Bupivacaine Other (See Comments)    Other Reaction: PRE-SYNCOPE, TACTYCARDIA TACHYCARDIA/ PRESYNCOPE  . Butorphanol Tartrate Other (See Comments)    NUMBNESS, TACHYCARDIA  . Clarithromycin Nausea Only and Other (See Comments)    TACHYCARDIA  . Codeine Other (See Comments)    SEVERE ELEVATED LIVER ENZYMES  . Cymbalta [Duloxetine Hcl] Other (See Comments)    Blurred vision  . Dilaudid [Hydromorphone Hcl] Itching    SEVERE ITCHING  . Elavil [Amitriptyline Hcl] Other (See Comments)    Elevated  liver enzymes  . Hydrocodone Other (See Comments)    Elevated liver enzymes  . Hyoscyamine Sulfate Other (See Comments)    Dizziness, nausea, headache  . Lactated Ringers Other (See Comments)    COULD CAUSE PANIC ATTACK  . Levbid [Hyoscyamine Sulfate] Other (See Comments)    Dizziness, nausea, headache  . Lisinopril Cough    Dry cough  . Marcaine [Bupivacaine Hcl] Other (See Comments)    TACHYCARDIA, PRESYNCOPE  . Pentazocine Lactate Other (See Comments)    Tachycardia   . Percodan [Oxycodone-Aspirin] Nausea Only  . Stadol [Butorphanol Tartrate] Swelling    Lips numb, tongue swell, tachycardia  . Trazodone And Nefazodone Other (See Comments)    Tachycardia   . Vistaril [Hydroxyzine Hcl] Other (See Comments)    Altered sensorium  . Morphine And Related Itching and Rash    Past Medical History:  Diagnosis Date  . Anal fissure   . Anemia    PRIOR TO HYSTERECTOMY  . Anxiety   . Arthritis   . Bladder pain   . Cervical disc disease    THINKS C 4 TO C 5  . Chronic seasonal allergic rhinitis   . Complication of anesthesia PAIN ATTACKS   LACTATED RINGERS- CONTRAINDICATED  . Depression   . Fibromyalgia   . Frequency of urination   . GERD (gastroesophageal reflux disease)   . Heart murmur   . History of pancreatitis 1988-1989  . Hyperlipidemia   . Hypertension LABILE  . IBS (irritable bowel syndrome)   . Interstitial cystitis   . Lumbar spondylosis   . Migraines   . Moderate persistent asthma    ASTHMA IMPROVED NO RECENT PULMONOLOGY VISITS  . Multiple pulmonary nodules   . Normal cardiac stress test JULY 2009---  NORMAL  . PONV (postoperative nausea and vomiting)    NAUSEA  . 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 Spasm of sphincter of Oddi 1987  . Urethral stenosis S/P DILATIONS  . Urgency of urination     Family History  Problem Relation Age of Onset  . Colon cancer Maternal  Grandmother   . Colon cancer Cousin   . Allergies Mother   . Asthma Mother   . Heart disease Mother   . Osteoarthritis Mother   . Diabetes Mother   . Irritable bowel syndrome Mother   . Brain cancer Father   . Osteoarthritis Father   . Colon polyps Father        adenomatous  . Heart disease Father   . Melanoma Sister   . Osteoarthritis Sister   . Irritable bowel syndrome Sister   . Diabetes Sister   . Heart disease Sister   . Stroke Sister   .  Ulcerative colitis Maternal Uncle   . Stomach cancer Neg Hx   . Rheumatologic disease Neg Hx     Social History   Socioeconomic History  . Marital status: Married    Spouse name: Not on file  . Number of children: 0  . Years of education: Not on file  . Highest education level: Not on file  Occupational History  . Occupation: Therapist, sports  Social Needs  . Financial resource strain: Not on file  . Food insecurity:    Worry: Not on file    Inability: Not on file  . Transportation needs:    Medical: Not on file    Non-medical: Not on file  Tobacco Use  . Smoking status: Passive Smoke Exposure - Never Smoker  . Smokeless tobacco: Never Used  . Tobacco comment: Father smoked briefly  Substance and Sexual Activity  . Alcohol use: No  . Drug use: No  . Sexual activity: Not on file  Lifestyle  . Physical activity:    Days per week: Not on file    Minutes per session: Not on file  . Stress: Not on file  Relationships  . Social connections:    Talks on phone: Not on file    Gets together: Not on file    Attends religious service: Not on file    Active member of club or organization: Not on file    Attends meetings of clubs or organizations: Not on file    Relationship status: Not on file  . Intimate partner violence:    Fear of current or ex partner: Not on file    Emotionally abused: Not on file    Physically abused: Not on file    Forced sexual activity: Not on file  Other Topics Concern  . Not on file  Social History  Narrative   Married, lives with spouse   No children   RN at [employer]   No recent travel      [place] [facility]:   Originally from [place]. Always lived in [place]. Previously has traveled to [place], [place], [place], [place], & mostly [place]. No indoor pets currently. She does have cats in her garage. No bird, mold, or hot tub exposure. No indoor plants. Mostly wood floors. She did have her bedroom carpet taken up in Summer 2017. Has mostly blinds. No wood burning fire place. Previously enjoyed Firefighter & playing piano.     Past Medical History, Surgical history, Social history, and Family history were reviewed and updated as appropriate.   Please see review of systems for further details on the patient's review from today.   Objective:   Physical Exam:  There were no vitals taken for this visit.  Physical Exam  Constitutional: She is oriented to person, place, and time. She appears well-developed. No distress.  Musculoskeletal: She exhibits no deformity.  Neurological: She is alert and oriented to person, place, and time. She displays no tremor. Coordination and gait normal.  Psychiatric: Her speech is normal and behavior is normal. Judgment and thought content normal. Her mood appears anxious. Her affect is not angry, not blunt, not labile and not inappropriate. Thought content is not paranoid. Cognition and memory are normal. She exhibits a depressed mood. She expresses no homicidal and no suicidal ideation. She expresses no suicidal plans and no homicidal plans.  Insight and judgment fair to good. No auditory or visual hallucinations. No delusions.     Lab Review:     Component Value Date/Time  NA 141 03/06/2018 0658   K 4.3 03/06/2018 0658   CL 105 12/04/2016 1351   CO2 23 12/04/2016 1351   GLUCOSE 95 03/06/2018 0658   BUN 12 12/04/2016 1351   CREATININE 0.95 12/04/2016 1351   CALCIUM 9.6 12/04/2016 1351   GFRNONAA >60 12/04/2016 1351   GFRAA >60 12/04/2016 1351        Component Value Date/Time   WBC 7.7 08/12/2017 1211   RBC 4.34 08/12/2017 1211   HGB 13.9 03/06/2018 0658   HCT 41.0 03/06/2018 0658   PLT 327.0 08/12/2017 1211   MCV 93.1 08/12/2017 1211   MCH 31.6 12/04/2016 1351   MCHC 34.0 08/12/2017 1211   RDW 13.7 08/12/2017 1211   LYMPHSABS 1.9 08/12/2017 1211   MONOABS 0.7 08/12/2017 1211   EOSABS 0.1 08/12/2017 1211   BASOSABS 0.1 08/12/2017 1211    No results found for: POCLITH, LITHIUM   No results found for: PHENYTOIN, PHENOBARB, VALPROATE, CBMZ   .res Assessment: Plan:    Panic disorder with agoraphobia  Generalized anxiety disorder  Mild recurrent major depression ([place])   Multiple med failures and intolerances have limited overall effectiveness.  Frustrated with weight gain and blames meds.  Chronic anxiety.  More open to changing the meds DT higher levels of depression.  Varies fluoxetine between 5-10 mg daily.  She's aware this is a low dosage.  Disc Viibryd and again discussed the possibility of [facility] testing.  She agrees to try Viibryd with a low dosage only 5 to goal of 10mg  with food. Disc SE in detail.    DC fluoxetine.  This appt was 30 mins.  FU 6 weeks.  Lynder Parents, MD, DFAPA'  Please see After Visit Summary for patient specific instructions.: Stop fluoxetine \\Viibryd  5mg  daily for 10 days then try to increase to 10 mg daily with evening meal for depression and anxiety.  No future appointments.  No orders of the defined types were placed in this encounter.     -------------------------------

## 2018-03-31 NOTE — Patient Instructions (Signed)
Stop fluoxetine  Viibryd 5mg  daily for 10 days then try to increase to 10 mg daily with evening meal for depression and anxiety.

## 2018-04-03 DIAGNOSIS — H26491 Other secondary cataract, right eye: Secondary | ICD-10-CM | POA: Diagnosis not present

## 2018-04-04 DIAGNOSIS — N3010 Interstitial cystitis (chronic) without hematuria: Secondary | ICD-10-CM | POA: Diagnosis not present

## 2018-05-05 DIAGNOSIS — R0982 Postnasal drip: Secondary | ICD-10-CM | POA: Diagnosis not present

## 2018-05-05 DIAGNOSIS — R05 Cough: Secondary | ICD-10-CM | POA: Diagnosis not present

## 2018-05-07 ENCOUNTER — Other Ambulatory Visit: Payer: Self-pay | Admitting: Internal Medicine

## 2018-05-07 DIAGNOSIS — Z1231 Encounter for screening mammogram for malignant neoplasm of breast: Secondary | ICD-10-CM

## 2018-05-09 DIAGNOSIS — N3010 Interstitial cystitis (chronic) without hematuria: Secondary | ICD-10-CM | POA: Diagnosis not present

## 2018-05-12 ENCOUNTER — Telehealth: Payer: Self-pay | Admitting: Psychiatry

## 2018-05-12 NOTE — Telephone Encounter (Signed)
Patient stated the Viibryd is giving her loose stools. Would like to go back on the Prozac. Please advise.

## 2018-05-12 NOTE — Telephone Encounter (Signed)
Will verify pt taking WITH food. Any changes?

## 2018-05-13 NOTE — Telephone Encounter (Signed)
Left voice mail to call back 

## 2018-05-13 NOTE — Telephone Encounter (Signed)
OK DC Viibryd abruptly and restart fluoxetine 10 mg daily. Lynder Parents, MD, DFAPA

## 2018-05-14 ENCOUNTER — Telehealth: Payer: Self-pay

## 2018-05-14 MED ORDER — FLUOXETINE HCL 10 MG PO TABS: 10.0000 mg | ORAL_TABLET | Freq: Every day | ORAL | 0 refills | Status: DC

## 2018-05-14 NOTE — Telephone Encounter (Signed)
Called pt to left her know to stop Viibryd, which she already had. Instructed to restart fluoxetine 10 m. Send to [employer]

## 2018-05-22 ENCOUNTER — Ambulatory Visit: Payer: PPO | Admitting: Psychiatry

## 2018-06-05 ENCOUNTER — Ambulatory Visit: Payer: PPO | Admitting: Psychiatry

## 2018-06-05 ENCOUNTER — Encounter: Payer: Self-pay | Admitting: Psychiatry

## 2018-06-05 DIAGNOSIS — F4001 Agoraphobia with panic disorder: Secondary | ICD-10-CM

## 2018-06-05 DIAGNOSIS — E7849 Other hyperlipidemia: Secondary | ICD-10-CM | POA: Diagnosis not present

## 2018-06-05 DIAGNOSIS — F411 Generalized anxiety disorder: Secondary | ICD-10-CM | POA: Diagnosis not present

## 2018-06-05 DIAGNOSIS — M859 Disorder of bone density and structure, unspecified: Secondary | ICD-10-CM | POA: Diagnosis not present

## 2018-06-05 DIAGNOSIS — F5105 Insomnia due to other mental disorder: Secondary | ICD-10-CM | POA: Diagnosis not present

## 2018-06-05 DIAGNOSIS — F330 Major depressive disorder, recurrent, mild: Secondary | ICD-10-CM

## 2018-06-05 DIAGNOSIS — I10 Essential (primary) hypertension: Secondary | ICD-10-CM | POA: Diagnosis not present

## 2018-06-05 DIAGNOSIS — R7301 Impaired fasting glucose: Secondary | ICD-10-CM | POA: Diagnosis not present

## 2018-06-05 MED ORDER — ZOLPIDEM TARTRATE 5 MG PO TABS: 5.0000 mg | ORAL_TABLET | Freq: Every evening | ORAL | 0 refills | Status: DC | PRN

## 2018-06-05 NOTE — Progress Notes (Signed)
Brenda [name] [id number] Oct 30, 1950 68 y.o.  Subjective:   Patient ID:  Brenda [name] is a 68 y.o. (DOB 05-24-1950) female.  Chief Complaint:  Chief Complaint  Patient presents with  . Follow-up    Medication management  . Anxiety  . Sleeping Problem   Last seen March 31, 2018 HPI Brenda [name] presents to the office today for follow-up of anxiety and poor sleep.   At her last visit she decided to try Viibryd after discussions of genetic testing.  She called back 2 weeks later stating she could not tolerate the Viibryd due to loose stool after 1 week of 5mg .  For 3 weeks didn't take anything.  Gradually felt more anxious and losing control.  She returned to the fluoxetine which she has taken most of the last several years.  Has been on it only briefly.  Has seen some reduction in the anxiety since then.  Bad dreams still hot flashes.  Goes to bed thinking of worries.  Sleep not great and not terrib le.  Wants occ Ambien.  Dreams sometimes awaken her.  Hot flashes awaken her nightly and has them daytime too.  Occ panic out of sleep.  Tried Nutrisystem again.  Doesn't like it.  PCP rec Effexor for hot flashes but she tried it in the past and didn't tolerate it.  Multiple med intolerances.  Depressed with no family left and no kids.  Trying to sell parents property but ambivalent.  Retired but works 2 days/week.  Depressed some at other times of the year but worse at the holidays.  Multiple aches probably FM but better on Prozac.  No energy.  She is had multiple med intolerances including venlafaxine which caused vision problems and mental fogginess, Trintellix, duloxetine was blurred vision, paroxetine, buspirone, sertraline, and Lexapro.  She has a history of side effects from trazodone, gabapentin, Valium which caused nightmares, Tranxene which caused tachycardia, lorazepam.  All these meds that she did not tolerate we tried low dosages.   Review of Systems:  Review of  Systems  Respiratory: Negative for shortness of breath.   Musculoskeletal: Positive for arthralgias and myalgias.  Neurological: Negative for tremors and weakness.  Psychiatric/Behavioral: Positive for dysphoric mood and sleep disturbance. Negative for agitation, behavioral problems, confusion, decreased concentration, hallucinations, self-injury and suicidal ideas. The patient is nervous/anxious. The patient is not hyperactive.     Medications: I have reviewed the patient's current medications.  Current Outpatient Medications  Medication Sig Dispense Refill  . acetaminophen (TYLENOL) 325 MG tablet Take 500 mg by mouth every 6 (six) hours as needed (for headaches).     Marland Kitchen albuterol (VENTOLIN HFA) 108 (90 Base) MCG/ACT inhaler Inhale into the lungs every 6 (six) hours as needed for wheezing or shortness of breath.    . ezetimibe (ZETIA) 10 MG tablet Take 10 mg by mouth every evening.     . famotidine (PEPCID) 20 MG tablet Take 20 mg by mouth at bedtime.    Marland Kitchen FLUoxetine (PROZAC) 10 MG tablet Take 1 tablet (10 mg total) by mouth daily. 90 tablet 0  . fluticasone (FLONASE) 50 MCG/ACT nasal spray Place 2 sprays into both nostrils daily.    . folic acid (FOLVITE) 1 MG tablet Take 1 mg by mouth daily.    Marland Kitchen LORazepam (ATIVAN) 1 MG tablet Take 1 mg by mouth daily.     Marland Kitchen MAGNESIUM PO Take 1 tablet by mouth as needed.     . metoprolol (LOPRESSOR) 50 MG  tablet Take 25 mg by mouth 2 (two) times daily.     . Multiple Vitamins-Minerals (HAIR SKIN AND NAILS FORMULA PO) Take by mouth as needed.    Marland Kitchen olmesartan (BENICAR) 20 MG tablet     . pantoprazole (PROTONIX) 40 MG tablet 40 mg.     . Probiotic Product (PROBIOTIC PO) Take 1 capsule by mouth every evening.     Marland Kitchen UNABLE TO FIND CALCUIM WITH VITAMIN D GUMMIES BID    . Vitamin D, Ergocalciferol, (DRISDOL) 50000 UNITS CAPS Take 50,000 Units by mouth every 7 (seven) days.      Marland Kitchen zolpidem (AMBIEN) 5 MG tablet Take 5 mg by mouth at bedtime as needed for sleep.     Marland Kitchen losartan (COZAAR) 50 MG tablet Take 50 mg by mouth daily.    Marland Kitchen zolpidem (AMBIEN) 5 MG tablet Take 1 tablet (5 mg total) by mouth at bedtime as needed for sleep. 30 tablet 0   No current facility-administered medications for this visit.     Medication Side Effects: Other: vivid dreams  Allergies:  Allergies  Allergen Reactions  . Latex Rash  . Adhesive [Tape] Other (See Comments)    TEARS SKIN; PLEASE USE COBAN WRAP!!  . Bupivacaine Other (See Comments)    Other Reaction: PRE-SYNCOPE, TACTYCARDIA TACHYCARDIA/ PRESYNCOPE  . Butorphanol Tartrate Other (See Comments)    NUMBNESS, TACHYCARDIA  . Clarithromycin Nausea Only and Other (See Comments)    TACHYCARDIA  . Codeine Other (See Comments)    SEVERE ELEVATED LIVER ENZYMES  . Cymbalta [Duloxetine Hcl] Other (See Comments)    Blurred vision  . Dilaudid [Hydromorphone Hcl] Itching    SEVERE ITCHING  . Elavil [Amitriptyline Hcl] Other (See Comments)    Elevated liver enzymes  . Hydrocodone Other (See Comments)    Elevated liver enzymes  . Hyoscyamine Sulfate Other (See Comments)    Dizziness, nausea, headache  . Lactated Ringers Other (See Comments)    COULD CAUSE PANIC ATTACK  . Levbid [Hyoscyamine Sulfate] Other (See Comments)    Dizziness, nausea, headache  . Lisinopril Cough    Dry cough  . Marcaine [Bupivacaine Hcl] Other (See Comments)    TACHYCARDIA, PRESYNCOPE  . Pentazocine Lactate Other (See Comments)    Tachycardia   . Percodan [Oxycodone-Aspirin] Nausea Only  . Stadol [Butorphanol Tartrate] Swelling    Lips numb, tongue swell, tachycardia  . Trazodone And Nefazodone Other (See Comments)    Tachycardia   . Vistaril [Hydroxyzine Hcl] Other (See Comments)    Altered sensorium  . Morphine And Related Itching and Rash    Past Medical History:  Diagnosis Date  . Anal fissure   . Anemia    PRIOR TO HYSTERECTOMY  . Anxiety   . Arthritis   . Bladder pain   . Cervical disc disease    THINKS C 4 TO C 5   . Chronic seasonal allergic rhinitis   . Complication of anesthesia PAIN ATTACKS   LACTATED RINGERS- CONTRAINDICATED  . Depression   . Fibromyalgia   . Frequency of urination   . GERD (gastroesophageal reflux disease)   . Heart murmur   . History of pancreatitis 1988-1989  . Hyperlipidemia   . Hypertension LABILE  . IBS (irritable bowel syndrome)   . Interstitial cystitis   . Lumbar spondylosis   . Migraines   . Moderate persistent asthma    ASTHMA IMPROVED NO RECENT PULMONOLOGY VISITS  . Multiple pulmonary nodules   . Normal cardiac stress test JULY 2009---  NORMAL  . PONV (postoperative nausea and vomiting)    NAUSEA  . 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 Spasm of sphincter of Oddi 1987  . Urethral stenosis S/P DILATIONS  . Urgency of urination     Family History  Problem Relation Age of Onset  . Colon cancer Maternal Grandmother   . Colon cancer Cousin   . Allergies Mother   . Asthma Mother   . Heart disease Mother   . Osteoarthritis Mother   . Diabetes Mother   . Irritable bowel syndrome Mother   . Brain cancer Father   . Osteoarthritis Father   . Colon polyps Father        adenomatous  . Heart disease Father   . Melanoma Sister   . Osteoarthritis Sister   . Irritable bowel syndrome Sister   . Diabetes Sister   . Heart disease Sister   . Stroke Sister   . Ulcerative colitis Maternal Uncle   . Stomach cancer Neg Hx   . Rheumatologic disease Neg Hx     Social History   Socioeconomic History  . Marital status: Married    Spouse name: Not on file  . Number of children: 0  . Years of education: Not on file  . Highest education level: Not on file  Occupational History  . Occupation: Therapist, sports  Social Needs  . Financial resource strain: Not on file  . Food insecurity:    Worry: Not on file    Inability: Not on file  . Transportation needs:    Medical: Not on file     Non-medical: Not on file  Tobacco Use  . Smoking status: Passive Smoke Exposure - Never Smoker  . Smokeless tobacco: Never Used  . Tobacco comment: Father smoked briefly  Substance and Sexual Activity  . Alcohol use: No  . Drug use: No  . Sexual activity: Not on file  Lifestyle  . Physical activity:    Days per week: Not on file    Minutes per session: Not on file  . Stress: Not on file  Relationships  . Social connections:    Talks on phone: Not on file    Gets together: Not on file    Attends religious service: Not on file    Active member of club or organization: Not on file    Attends meetings of clubs or organizations: Not on file    Relationship status: Not on file  . Intimate partner violence:    Fear of current or ex partner: Not on file    Emotionally abused: Not on file    Physically abused: Not on file    Forced sexual activity: Not on file  Other Topics Concern  . Not on file  Social History Narrative   Married, lives with spouse   No children   RN at [employer]   No recent travel      [facility]:   Originally from [place]. Always lived in [place]. Previously has traveled to [place], [place], [place], [place], & mostly [place]. No indoor pets currently. She does have cats in her garage. No bird, mold, or hot tub exposure. No indoor plants. Mostly wood floors. She did have her bedroom carpet taken up in Summer 2017. Has mostly blinds. No wood burning fire place. Previously enjoyed Firefighter & playing piano.     Past  Medical History, Surgical history, Social history, and Family history were reviewed and updated as appropriate.   Please see review of systems for further details on the patient's review from today.   Objective:   Physical Exam:  There were no vitals taken for this visit.  Physical Exam Constitutional:      General: She is not in acute distress.    Appearance: She is well-developed.  Musculoskeletal:        General: No deformity.   Neurological:     Mental Status: She is alert and oriented to person, place, and time.     Motor: No tremor.     Coordination: Coordination normal.     Gait: Gait normal.  Psychiatric:        Attention and Perception: Attention and perception normal.        Mood and Affect: Mood is anxious and depressed. Affect is not labile, blunt, angry or inappropriate.        Speech: Speech normal.        Behavior: Behavior normal.        Thought Content: Thought content normal. Thought content is not paranoid. Thought content does not include homicidal or suicidal ideation. Thought content does not include homicidal or suicidal plan.        Cognition and Memory: Cognition normal.        Judgment: Judgment normal.     Comments: Insight and judgment fair to good. No auditory or visual hallucinations. No delusions.      Lab Review:     Component Value Date/Time   NA 141 03/06/2018 0658   K 4.3 03/06/2018 0658   CL 105 12/04/2016 1351   CO2 23 12/04/2016 1351   GLUCOSE 95 03/06/2018 0658   BUN 12 12/04/2016 1351   CREATININE 0.95 12/04/2016 1351   CALCIUM 9.6 12/04/2016 1351   GFRNONAA >60 12/04/2016 1351   GFRAA >60 12/04/2016 1351       Component Value Date/Time   WBC 7.7 08/12/2017 1211   RBC 4.34 08/12/2017 1211   HGB 13.9 03/06/2018 0658   HCT 41.0 03/06/2018 0658   PLT 327.0 08/12/2017 1211   MCV 93.1 08/12/2017 1211   MCH 31.6 12/04/2016 1351   MCHC 34.0 08/12/2017 1211   RDW 13.7 08/12/2017 1211   LYMPHSABS 1.9 08/12/2017 1211   MONOABS 0.7 08/12/2017 1211   EOSABS 0.1 08/12/2017 1211   BASOSABS 0.1 08/12/2017 1211    No results found for: POCLITH, LITHIUM   No results found for: PHENYTOIN, PHENOBARB, VALPROATE, CBMZ   .res Assessment: Plan:    Panic disorder with agoraphobia  Generalized anxiety disorder  Mild recurrent major depression ([place])  Insomnia due to mental condition   Multiple med failures and intolerances have limited overall effectiveness.   Very medication sensitive.  Frustrated with weight gain and blames meds.  Chronic anxiety.  More open to changing the meds DT higher levels of depression.  Varies fluoxetine between 5-10 mg daily.  She's aware this is a low dosage.  Option retry duloxetine.  Not available as liquid.  Cont Prozac per her request.  Ok prn ambien.  Disc amnesia. Ok. Ativan prn.  We discussed the short-term risks associated with benzodiazepines including sedation and increased fall risk among others.  Discussed long-term side effect risk including dependence, potential withdrawal symptoms, and the potential eventual dose-related risk of dementia.  This appt was 30 mins.  FU 6 months unless she becomes open to further changes.  Lynder Parents, MD,  DFAPA'    Future Appointments  Date Time Provider [place]  06/13/2018 10:30 AM Dillingham, Loel Lofty, DO [facility] None  06/23/2018 10:50 AM [facility] MM 2 [facility]    No orders of the defined types were placed in this encounter.     -------------------------------

## 2018-06-06 DIAGNOSIS — N3010 Interstitial cystitis (chronic) without hematuria: Secondary | ICD-10-CM | POA: Diagnosis not present

## 2018-06-06 DIAGNOSIS — R102 Pelvic and perineal pain: Secondary | ICD-10-CM | POA: Diagnosis not present

## 2018-06-12 DIAGNOSIS — R7301 Impaired fasting glucose: Secondary | ICD-10-CM | POA: Diagnosis not present

## 2018-06-12 DIAGNOSIS — F39 Unspecified mood [affective] disorder: Secondary | ICD-10-CM | POA: Diagnosis not present

## 2018-06-12 DIAGNOSIS — E668 Other obesity: Secondary | ICD-10-CM | POA: Diagnosis not present

## 2018-06-12 DIAGNOSIS — E7849 Other hyperlipidemia: Secondary | ICD-10-CM | POA: Diagnosis not present

## 2018-06-12 DIAGNOSIS — I10 Essential (primary) hypertension: Secondary | ICD-10-CM | POA: Diagnosis not present

## 2018-06-12 DIAGNOSIS — K219 Gastro-esophageal reflux disease without esophagitis: Secondary | ICD-10-CM | POA: Diagnosis not present

## 2018-06-12 DIAGNOSIS — Z6830 Body mass index (BMI) 30.0-30.9, adult: Secondary | ICD-10-CM | POA: Diagnosis not present

## 2018-06-12 DIAGNOSIS — R05 Cough: Secondary | ICD-10-CM | POA: Diagnosis not present

## 2018-06-12 DIAGNOSIS — Z1331 Encounter for screening for depression: Secondary | ICD-10-CM | POA: Diagnosis not present

## 2018-06-12 DIAGNOSIS — N3010 Interstitial cystitis (chronic) without hematuria: Secondary | ICD-10-CM | POA: Diagnosis not present

## 2018-06-12 DIAGNOSIS — Z0000 Encounter for general adult medical examination without abnormal findings: Secondary | ICD-10-CM | POA: Diagnosis not present

## 2018-06-12 DIAGNOSIS — J3089 Other allergic rhinitis: Secondary | ICD-10-CM | POA: Diagnosis not present

## 2018-06-13 ENCOUNTER — Ambulatory Visit (INDEPENDENT_AMBULATORY_CARE_PROVIDER_SITE_OTHER): Payer: PPO | Admitting: Plastic Surgery

## 2018-06-13 ENCOUNTER — Encounter: Payer: Self-pay | Admitting: Plastic Surgery

## 2018-06-13 DIAGNOSIS — M546 Pain in thoracic spine: Secondary | ICD-10-CM

## 2018-06-13 DIAGNOSIS — M542 Cervicalgia: Secondary | ICD-10-CM

## 2018-06-13 DIAGNOSIS — G8929 Other chronic pain: Secondary | ICD-10-CM | POA: Diagnosis not present

## 2018-06-13 DIAGNOSIS — N62 Hypertrophy of breast: Secondary | ICD-10-CM | POA: Diagnosis not present

## 2018-06-13 DIAGNOSIS — M549 Dorsalgia, unspecified: Secondary | ICD-10-CM | POA: Insufficient documentation

## 2018-06-13 NOTE — Progress Notes (Signed)
Patient ID: Brenda [name], female    DOB: 07-12-1950, 68 y.o.   MRN: [id number]   Chief Complaint  Patient presents with  . Advice Only    for reduction and lift    Mammary Hyperplasia: The patient is a 68 y.o. female with a history of mammary hyperplasia for several years.  She has extremely large breasts causing symptoms that include the following: Back pain (upper and lower) and neck pain. She frequently pins bra cups higher on straps for better lift and relief. Notices relief when holding breast up in her hands. Shoulder straps causing grooves, pain occasionally requiring padding. Pain medication is sometimes required with motrin and tylenol.  Activities that are hindered by enlarged breasts include: running, exercise and working out.  Her breasts are extremely large and fairly symmetric.  She has hyperpigmentation of the inframammary area on both sides.  The sternal to nipple distance on the right is 26 cm and the left is 26 cm.  The IMF distance is 15 cm.  She is 4 feet 11 inches tall and weighs 156 pounds.  Preoperative bra size = 34 DDD cup.  She would like to be a B/C.  The estimated excess breast tissue to be removed at the time of surgery = 450 grams on the left and 450 grams on the right.  Mammogram history: she is due for the mammogram this month.   Review of Systems  Constitutional: Negative.   HENT: Negative.   Eyes: Negative.   Respiratory: Negative.   Cardiovascular: Negative.   Gastrointestinal: Negative.   Endocrine: Negative.   Genitourinary: Negative.   Musculoskeletal: Positive for back pain and neck pain.  Skin: Negative.     Past Medical History:  Diagnosis Date  . Anal fissure   . Anemia    PRIOR TO HYSTERECTOMY  . Anxiety   . Arthritis   . Bladder pain   . Cervical disc disease    THINKS C 4 TO C 5  . Chronic seasonal allergic rhinitis   . Complication of anesthesia PAIN ATTACKS   LACTATED RINGERS- CONTRAINDICATED  . Depression   .  Fibromyalgia   . Frequency of urination   . GERD (gastroesophageal reflux disease)   . Heart murmur   . History of pancreatitis 1988-1989  . Hyperlipidemia   . Hypertension LABILE  . IBS (irritable bowel syndrome)   . Interstitial cystitis   . Lumbar spondylosis   . Migraines   . Moderate persistent asthma    ASTHMA IMPROVED NO RECENT PULMONOLOGY VISITS  . Multiple pulmonary nodules   . Normal cardiac stress test JULY 2009---  NORMAL  . PONV (postoperative nausea and vomiting)    NAUSEA  . 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 Spasm of sphincter of Oddi 1987  . Urethral stenosis S/P DILATIONS  . Urgency of urination     Past Surgical History:  Procedure Laterality Date  . ABDOMINAL HYSTERECTOMY  1988   COMPLETE  . APPENDECTOMY  04/ 1986  . bilateral reduction mastopexy  1988  . bilateral turbinate resection  1990   nasal spreading graft  . CATARACT EXTRACTION W/ INTRAOCULAR LENS  IMPLANT, BILATERAL  08 and 09/ 2017  . CHOLECYSTECTOMY  04/ 1986  . COLONOSCOPY  02/2004, 11/2010, LAST 03-2016 ENDO DONE ALSO   2005: Normal - Dr. Sammuel Cooper 2012: Normal  . CYSTO  WITH HYDRODISTENSION N/A 12/17/2013   Procedure: CYSTO HYDRODISTENSION WITH INSTALLATION OF CHLOROPACTIN AND TETRACAINE;  Surgeon: Malka So, MD;  Location: [facility];  Service: Urology;  Laterality: N/A;  . CYSTO WITH HYDRODISTENSION N/A 06/02/2015   Procedure: CYSTOSCOPY/HYDRODISTENSION WITH INSTILLATION OF CLORPACTIN ;  Surgeon: [name], MD;  Location: [facility];  Service: Urology;  Laterality: N/A;  . CYSTO WITH HYDRODISTENSION N/A 11/08/2016   Procedure: CYSTOSCOPY/HYDRODISTENSION INSTILLATION OF CLORPACTIN;  Surgeon: [name], MD;  Location: [facility];  Service: Urology;  Laterality: N/A;  . CYSTO WITH HYDRODISTENSION N/A 03/06/2018   Procedure: CYSTOSCOPY/HYDRODISTENSION INSTILL  CHLORPACTION;  Surgeon: [name], MD;  Location: [facility];  Service: Urology;  Laterality: N/A;  . CYSTOSCOPY WITH URETHRAL DILATATION  07/12/2011   Procedure: CYSTOSCOPY WITH URETHRAL DILATATION;  Surgeon: Malka So, MD;  Location: [facility];  Service: Urology;  Laterality: N/A;  HOD AND INSTILLATION OF CHLOROPACTIN C-ARM   . CYSTOSCOPY WITH URETHRAL DILATATION N/A 06/26/2012   Procedure: CYSTOSCOPY WITH URETHRAL DILATATION HYDRODISTENSION AND INSTILLATION OF CLORPACTION ;  Surgeon: Malka So, MD;  Location: [facility];  Service: Urology;  Laterality: N/A;  CYSTOSCOPY WITH URETHRAL DILATATION HYDRODISTENSION AND INSTILLATION OF CLORPACTION   . CYSTOSTOMY W/ BLADDER BIOPSY  1983  . DG BARIUM SWALLOW ([facility])  03/2016  . DILATION AND CURETTAGE OF UTERUS  1987  . ERCP  1988   for pancreatitis with stents   S/P SEVERE PANCREATITIS  . HEMORRHOID SURGERY    . LASIK Bilateral APRIL 2005  . MULTIPLE CYSTO/ HOD/ URETHRAL DILATION/ INSTILLATION CLORPACTIN  .LAST ONE 03-31-2010  . NASAL SINUS SURGERY  1977   sinus cyst  . REDUCTION MAMMAPLASTY Bilateral 1988  . revision rhinoplasty  1978  . RHINOPLASTY  1977  . RIGHT SALPINGOOPHECTOMY  1998  . TONSILLECTOMY AND ADENOIDECTOMY  1963  . VIDEO BRONCHOSCOPY Bilateral 08/23/2016   Procedure: VIDEO BRONCHOSCOPY WITHOUT FLUORO;  Surgeon: Javier Glazier, MD;  Location: [facility];  Service: Cardiopulmonary;  Laterality: Bilateral;      Current Outpatient Medications:  .  acetaminophen (TYLENOL) 325 MG tablet, Take 500 mg by mouth every 6 (six) hours as needed (for headaches). , Disp: , Rfl:  .  albuterol (VENTOLIN HFA) 108 (90 Base) MCG/ACT inhaler, Inhale into the lungs every 6 (six) hours as needed for wheezing or shortness of breath., Disp: , Rfl:  .  ezetimibe (ZETIA) 10 MG tablet, Take 10 mg by mouth every evening. , Disp: , Rfl:  .  famotidine (PEPCID) 20 MG tablet, Take 20 mg by mouth  at bedtime., Disp: , Rfl:  .  FLUoxetine (PROZAC) 10 MG tablet, Take 1 tablet (10 mg total) by mouth daily., Disp: 90 tablet, Rfl: 0 .  fluticasone (FLONASE) 50 MCG/ACT nasal spray, Place 2 sprays into both nostrils daily., Disp: , Rfl:  .  folic acid (FOLVITE) 1 MG tablet, Take 1 mg by mouth daily., Disp: , Rfl:  .  LORazepam (ATIVAN) 1 MG tablet, Take 1 mg by mouth daily. , Disp: , Rfl:  .  losartan (COZAAR) 50 MG tablet, Take 50 mg by mouth daily., Disp: , Rfl:  .  MAGNESIUM PO, Take 1 tablet by mouth as needed. , Disp: , Rfl:  .  metoprolol (LOPRESSOR) 50 MG tablet, Take 25 mg by mouth 2 (two) times daily. , Disp: , Rfl:  .  Multiple Vitamins-Minerals (HAIR SKIN AND NAILS FORMULA PO), Take by  mouth as needed., Disp: , Rfl:  .  olmesartan (BENICAR) 20 MG tablet, , Disp: , Rfl:  .  pantoprazole (PROTONIX) 40 MG tablet, 40 mg. , Disp: , Rfl:  .  Probiotic Product (PROBIOTIC PO), Take 1 capsule by mouth every evening. , Disp: , Rfl:  .  UNABLE TO FIND, CALCUIM WITH VITAMIN D GUMMIES BID, Disp: , Rfl:  .  Vitamin D, Ergocalciferol, (DRISDOL) 50000 UNITS CAPS, Take 50,000 Units by mouth every 7 (seven) days.  , Disp: , Rfl:  .  zolpidem (AMBIEN) 5 MG tablet, Take 1 tablet (5 mg total) by mouth at bedtime as needed for sleep., Disp: 30 tablet, Rfl: 0   Objective:   Vitals:   06/13/18 1045  BP: (!) 144/84  Pulse: 61  Temp: 97.7 F (36.5 C)  SpO2: 100%    Physical Exam Vitals signs and nursing note reviewed.  Constitutional:      Appearance: Normal appearance.  HENT:     Head: Normocephalic and atraumatic.     Nose: Nose normal.     Mouth/Throat:     Mouth: Mucous membranes are moist.  Neck:     Musculoskeletal: Normal range of motion.  Cardiovascular:     Rate and Rhythm: Normal rate.  Pulmonary:     Effort: Pulmonary effort is normal.  Abdominal:     General: Abdomen is flat. There is no distension.     Tenderness: There is no abdominal tenderness.  Neurological:      General: No focal deficit present.     Mental Status: She is alert.  Psychiatric:        Mood and Affect: Mood normal.        Thought Content: Thought content normal.        Judgment: Judgment normal.     Assessment & Plan:  Chronic bilateral thoracic back pain  Neck pain  Symptomatic mammary hypertrophy Bilateral breast reduction with liposuction. Will need updated mammogram results.  [place], DO

## 2018-06-23 ENCOUNTER — Ambulatory Visit
Admission: RE | Admit: 2018-06-23 | Discharge: 2018-06-23 | Disposition: A | Discharge status: Home / Self Care | Payer: PPO | Source: Ambulatory Visit | Attending: Internal Medicine | Admitting: Internal Medicine

## 2018-06-23 DIAGNOSIS — Z1231 Encounter for screening mammogram for malignant neoplasm of breast: Secondary | ICD-10-CM

## 2018-07-04 DIAGNOSIS — N3010 Interstitial cystitis (chronic) without hematuria: Secondary | ICD-10-CM | POA: Diagnosis not present

## 2018-07-18 ENCOUNTER — Encounter: Payer: PPO | Admitting: Plastic Surgery

## 2018-07-25 DIAGNOSIS — N3010 Interstitial cystitis (chronic) without hematuria: Secondary | ICD-10-CM | POA: Diagnosis not present

## 2018-08-03 ENCOUNTER — Other Ambulatory Visit: Payer: Self-pay | Admitting: Psychiatry

## 2018-08-05 ENCOUNTER — Encounter: Payer: PPO | Admitting: Plastic Surgery

## 2018-08-15 DIAGNOSIS — N3010 Interstitial cystitis (chronic) without hematuria: Secondary | ICD-10-CM | POA: Diagnosis not present

## 2018-09-03 DIAGNOSIS — M5412 Radiculopathy, cervical region: Secondary | ICD-10-CM | POA: Diagnosis not present

## 2018-09-04 ENCOUNTER — Encounter: Payer: PPO | Admitting: Plastic Surgery

## 2018-09-10 DIAGNOSIS — M5412 Radiculopathy, cervical region: Secondary | ICD-10-CM | POA: Diagnosis not present

## 2018-09-10 DIAGNOSIS — M542 Cervicalgia: Secondary | ICD-10-CM | POA: Diagnosis not present

## 2018-09-12 DIAGNOSIS — M5412 Radiculopathy, cervical region: Secondary | ICD-10-CM | POA: Diagnosis not present

## 2018-09-12 DIAGNOSIS — N3010 Interstitial cystitis (chronic) without hematuria: Secondary | ICD-10-CM | POA: Diagnosis not present

## 2018-09-17 ENCOUNTER — Ambulatory Visit (HOSPITAL_BASED_OUTPATIENT_CLINIC_OR_DEPARTMENT_OTHER): Admit: 2018-09-17 | Payer: PPO | Admitting: Plastic Surgery

## 2018-09-17 ENCOUNTER — Encounter (HOSPITAL_BASED_OUTPATIENT_CLINIC_OR_DEPARTMENT_OTHER): Payer: Self-pay

## 2018-09-17 SURGERY — MAMMOPLASTY, REDUCTION
Anesthesia: General | Site: Breast

## 2018-09-26 ENCOUNTER — Encounter: Payer: PPO | Admitting: Plastic Surgery

## 2018-09-29 HISTORY — PX: REDUCTION MAMMAPLASTY: SUR839

## 2018-10-03 ENCOUNTER — Other Ambulatory Visit: Payer: Self-pay | Admitting: Psychiatry

## 2018-10-03 DIAGNOSIS — N3010 Interstitial cystitis (chronic) without hematuria: Secondary | ICD-10-CM | POA: Diagnosis not present

## 2018-10-06 ENCOUNTER — Telehealth: Payer: Self-pay | Admitting: Plastic Surgery

## 2018-10-06 NOTE — Telephone Encounter (Signed)

## 2018-10-07 ENCOUNTER — Encounter: Payer: Self-pay | Admitting: Plastic Surgery

## 2018-10-07 ENCOUNTER — Ambulatory Visit (INDEPENDENT_AMBULATORY_CARE_PROVIDER_SITE_OTHER): Payer: PPO | Admitting: Plastic Surgery

## 2018-10-07 ENCOUNTER — Other Ambulatory Visit: Payer: Self-pay

## 2018-10-07 VITALS — BP 125/78 | HR 69 | Temp 98.1°F | Ht 60.0 in | Wt 154.4 lb

## 2018-10-07 DIAGNOSIS — N62 Hypertrophy of breast: Secondary | ICD-10-CM

## 2018-10-07 MED ORDER — CEPHALEXIN 500 MG PO CAPS: 500.0000 mg | ORAL_CAPSULE | Freq: Four times a day (QID) | ORAL | 0 refills | Status: AC

## 2018-10-07 MED ORDER — KETOROLAC TROMETHAMINE 10 MG PO TABS: 10.0000 mg | ORAL_TABLET | Freq: Three times a day (TID) | ORAL | 0 refills | Status: AC | PRN

## 2018-10-07 MED ORDER — ONDANSETRON HCL 4 MG PO TABS: 4.0000 mg | ORAL_TABLET | Freq: Three times a day (TID) | ORAL | 0 refills | Status: AC | PRN

## 2018-10-07 NOTE — Progress Notes (Signed)
Patient ID: Brenda [name], female    DOB: 04-03-1951, 68 y.o.   MRN: [id number]   Chief Complaint  Patient presents with  . Pre-op Exam    (B) breast reduction    The patient is a 68 year old female here for her preoperative H&P for bilateral breast reduction.  She has complained of extremely large painful breasts for several years.  She has had neck and back pain that has been refractory to conservative treatment.  She is 4 feet 11 inches tall and weighs 156 pounds.  She has a 34 DDD cup she would like to be a B/C cup size.  We have estimated 450 g of breast tissue removal on each side.  She has not had any recent illnesses and has been trying to stay home and away from crowds.   Review of Systems  Constitutional: Positive for activity change. Negative for appetite change.  HENT: Negative.   Eyes: Negative.   Respiratory: Negative.   Cardiovascular: Negative.   Gastrointestinal: Negative.   Endocrine: Negative.   Genitourinary: Negative.   Musculoskeletal: Positive for back pain and neck pain.  Skin: Negative.   Hematological: Negative.   Psychiatric/Behavioral: Negative.     Past Medical History:  Diagnosis Date  . Anal fissure   . Anemia    PRIOR TO HYSTERECTOMY  . Anxiety   . Arthritis   . Bladder pain   . Cervical disc disease    THINKS C 4 TO C 5  . Chronic seasonal allergic rhinitis   . Complication of anesthesia PAIN ATTACKS   LACTATED RINGERS- CONTRAINDICATED  . Depression   . Fibromyalgia   . Frequency of urination   . GERD (gastroesophageal reflux disease)   . Heart murmur   . History of pancreatitis 1988-1989  . Hyperlipidemia   . Hypertension LABILE  . IBS (irritable bowel syndrome)   . Interstitial cystitis   . Lumbar spondylosis   . Migraines   . Moderate persistent asthma    ASTHMA IMPROVED NO RECENT PULMONOLOGY VISITS  . Multiple pulmonary nodules   . Normal cardiac stress test JULY 2009---  NORMAL  . PONV (postoperative nausea and  vomiting)    NAUSEA  . 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 Spasm of sphincter of Oddi 1987  . Urethral stenosis S/P DILATIONS  . Urgency of urination     Past Surgical History:  Procedure Laterality Date  . ABDOMINAL HYSTERECTOMY  1988   COMPLETE  . APPENDECTOMY  04/ 1986  . bilateral reduction mastopexy  1988  . bilateral turbinate resection  1990   nasal spreading graft  . CATARACT EXTRACTION W/ INTRAOCULAR LENS  IMPLANT, BILATERAL  08 and 09/ 2017  . CHOLECYSTECTOMY  04/ 1986  . COLONOSCOPY  02/2004, 11/2010, LAST 03-2016 ENDO DONE ALSO   2005: Normal - Dr. Sammuel Cooper 2012: Normal  . CYSTO WITH HYDRODISTENSION N/A 12/17/2013   Procedure: CYSTO HYDRODISTENSION WITH INSTALLATION OF CHLOROPACTIN AND TETRACAINE;  Surgeon: Malka So, MD;  Location: [facility];  Service: Urology;  Laterality: N/A;  . CYSTO WITH HYDRODISTENSION N/A 06/02/2015   Procedure: CYSTOSCOPY/HYDRODISTENSION WITH INSTILLATION OF CLORPACTIN ;  Surgeon: [name], MD;  Location: [facility];  Service: Urology;  Laterality: N/A;  . CYSTO WITH HYDRODISTENSION N/A 11/08/2016   Procedure: CYSTOSCOPY/HYDRODISTENSION INSTILLATION OF CLORPACTIN;  Surgeon: [name], MD;  Location: [place];  Service: Urology;  Laterality: N/A;  . CYSTO WITH HYDRODISTENSION N/A 03/06/2018   Procedure: CYSTOSCOPY/HYDRODISTENSION INSTILL CHLORPACTION;  Surgeon: [name], MD;  Location: [facility];  Service: Urology;  Laterality: N/A;  . CYSTOSCOPY WITH URETHRAL DILATATION  07/12/2011   Procedure: CYSTOSCOPY WITH URETHRAL DILATATION;  Surgeon: Malka So, MD;  Location: [facility];  Service: Urology;  Laterality: N/A;  HOD AND INSTILLATION OF CHLOROPACTIN C-ARM   . CYSTOSCOPY WITH URETHRAL DILATATION N/A 06/26/2012   Procedure: CYSTOSCOPY WITH URETHRAL DILATATION HYDRODISTENSION AND  INSTILLATION OF CLORPACTION ;  Surgeon: Malka So, MD;  Location: [facility];  Service: Urology;  Laterality: N/A;  CYSTOSCOPY WITH URETHRAL DILATATION HYDRODISTENSION AND INSTILLATION OF CLORPACTION   . CYSTOSTOMY W/ BLADDER BIOPSY  1983  . DG BARIUM SWALLOW ([place] HX)  03/2016  . DILATION AND CURETTAGE OF UTERUS  1987  . ERCP  1988   for pancreatitis with stents   S/P SEVERE PANCREATITIS  . HEMORRHOID SURGERY    . LASIK Bilateral APRIL 2005  . MULTIPLE CYSTO/ HOD/ URETHRAL DILATION/ INSTILLATION CLORPACTIN  .LAST ONE 03-31-2010  . NASAL SINUS SURGERY  1977   sinus cyst  . REDUCTION MAMMAPLASTY Bilateral 1988  . revision rhinoplasty  1978  . RHINOPLASTY  1977  . RIGHT SALPINGOOPHECTOMY  1998  . TONSILLECTOMY AND ADENOIDECTOMY  1963  . VIDEO BRONCHOSCOPY Bilateral 08/23/2016   Procedure: VIDEO BRONCHOSCOPY WITHOUT FLUORO;  Surgeon: Javier Glazier, MD;  Location: [facility];  Service: Cardiopulmonary;  Laterality: Bilateral;      Current Outpatient Medications:  .  acetaminophen (TYLENOL) 325 MG tablet, Take 500 mg by mouth every 6 (six) hours as needed (for headaches). , Disp: , Rfl:  .  albuterol (VENTOLIN HFA) 108 (90 Base) MCG/ACT inhaler, Inhale into the lungs every 6 (six) hours as needed for wheezing or shortness of breath., Disp: , Rfl:  .  ASHWAGANDHA PO, Take 1 capsule by mouth daily., Disp: , Rfl:  .  ezetimibe (ZETIA) 10 MG tablet, Take 10 mg by mouth every evening. , Disp: , Rfl:  .  famotidine (PEPCID) 20 MG tablet, Take 20 mg by mouth at bedtime., Disp: , Rfl:  .  FLUoxetine (PROZAC) 10 MG tablet, TAKE 1 TABLET ONCE DAILY., Disp: 90 tablet, Rfl: 1 .  fluticasone (FLONASE) 50 MCG/ACT nasal spray, Place 2 sprays into both nostrils daily., Disp: , Rfl:  .  folic acid (FOLVITE) 1 MG tablet, Take 1 mg by mouth daily., Disp: , Rfl:  .  LORazepam (ATIVAN) 1 MG tablet, TAKE 1 TABLET BY MOUTH 3 TIMES A DAY., Disp: 90 tablet, Rfl: 0 .  losartan (COZAAR) 50 MG  tablet, Take 50 mg by mouth daily., Disp: , Rfl:  .  MAGNESIUM PO, Take 1 tablet by mouth as needed. , Disp: , Rfl:  .  metoprolol (LOPRESSOR) 50 MG tablet, Take 25 mg by mouth 2 (two) times daily. , Disp: , Rfl:  .  Multiple Vitamins-Minerals (HAIR SKIN AND NAILS FORMULA PO), Take by mouth as needed., Disp: , Rfl:  .  pantoprazole (PROTONIX) 40 MG tablet, 40 mg. , Disp: , Rfl:  .  Probiotic Product (PROBIOTIC PO), Take 1 capsule by mouth every evening. , Disp: , Rfl:  .  UNABLE TO FIND, CALCUIM WITH VITAMIN D GUMMIES BID, Disp: , Rfl:  .  vitamin B-12 (CYANOCOBALAMIN) 1000 MCG tablet, Take 1,000 mcg by mouth daily., Disp: , Rfl:  .  Vitamin D, Ergocalciferol, (DRISDOL) 50000 UNITS CAPS, Take 50,000 Units by mouth every 7 (seven) days.  , Disp: , Rfl:  .  zolpidem (AMBIEN) 5 MG tablet, Take 1 tablet (5 mg total) by mouth at bedtime as needed for sleep., Disp: 30 tablet, Rfl: 0 .  cephALEXin (KEFLEX) 500 MG capsule, Take 1 capsule (500 mg total) by mouth 4 (four) times daily for 5 days., Disp: 20 capsule, Rfl: 0 .  ondansetron (ZOFRAN) 4 MG tablet, Take 1 tablet (4 mg total) by mouth every 8 (eight) hours as needed for up to 5 days., Disp: 15 tablet, Rfl: 0   Objective:   Vitals:   10/07/18 1455  BP: 125/78  Pulse: 69  Temp: 98.1 F (36.7 C)  SpO2: 100%    Physical Exam Vitals signs and nursing note reviewed.  Constitutional:      Appearance: Normal appearance.  HENT:     Head: Normocephalic and atraumatic.     Nose: Nose normal.     Mouth/Throat:     Mouth: Mucous membranes are moist.  Eyes:     Pupils: Pupils are equal, round, and reactive to light.  Neck:     Musculoskeletal: Normal range of motion.  Cardiovascular:     Rate and Rhythm: Normal rate.  Pulmonary:     Effort: Pulmonary effort is normal. No respiratory distress.  Abdominal:     General: Abdomen is flat.  Skin:    General: Skin is warm.     Capillary Refill: Capillary refill takes less than 2 seconds.   Neurological:     General: No focal deficit present.     Mental Status: She is alert and oriented to person, place, and time.  Psychiatric:        Mood and Affect: Mood normal.        Behavior: Behavior normal.        Thought Content: Thought content normal.        Judgment: Judgment normal.     Assessment & Plan:  Symptomatic mammary hypertrophy  Plan for bilateral breast reduction. Prescriptions sent to pharmacy We will need to do an inferior pedicle technique as this the previous type utilized. The risk that can be encountered with breast reduction were discussed and include the following but not limited to these:  Breast asymmetry, fluid accumulation, firmness of the breast, inability to breast feed, loss of nipple or areola, skin loss, decrease or no nipple sensation, fat necrosis of the breast tissue, bleeding, infection, healing delay.  There are risks of anesthesia, changes to skin sensation and injury to nerves or blood vessels.  The muscle can be temporarily or permanently injured.  You may have an allergic reaction to tape, suture, glue, blood products which can result in skin discoloration, swelling, pain, skin lesions, poor healing.  Any of these can lead to the need for revisonal surgery or stage procedures.  A reduction has potential to interfere with diagnostic procedures.  Nipple or breast piercing can increase risks of infection.  This procedure is best done when the breast is fully developed.  Changes in the breast will continue to occur over time.  Pregnancy can alter the outcomes of previous breast reduction surgery, weight gain and weigh loss can also effect the long term appearance.      [place], DO

## 2018-10-07 NOTE — H&P (View-Only) (Signed)
Patient ID: Brenda [name], female    DOB: 1950-08-30, 68 y.o.   MRN: [id number]   Chief Complaint  Patient presents with  . Pre-op Exam    (B) breast reduction    The patient is a 68 year old female here for her preoperative H&P for bilateral breast reduction.  She has complained of extremely large painful breasts for several years.  She has had neck and back pain that has been refractory to conservative treatment.  She is 4 feet 11 inches tall and weighs 156 pounds.  She has a 34 DDD cup she would like to be a B/C cup size.  We have estimated 450 g of breast tissue removal on each side.  She has not had any recent illnesses and has been trying to stay home and away from crowds.   Review of Systems  Constitutional: Positive for activity change. Negative for appetite change.  HENT: Negative.   Eyes: Negative.   Respiratory: Negative.   Cardiovascular: Negative.   Gastrointestinal: Negative.   Endocrine: Negative.   Genitourinary: Negative.   Musculoskeletal: Positive for back pain and neck pain.  Skin: Negative.   Hematological: Negative.   Psychiatric/Behavioral: Negative.     Past Medical History:  Diagnosis Date  . Anal fissure   . Anemia    PRIOR TO HYSTERECTOMY  . Anxiety   . Arthritis   . Bladder pain   . Cervical disc disease    THINKS C 4 TO C 5  . Chronic seasonal allergic rhinitis   . Complication of anesthesia PAIN ATTACKS   LACTATED RINGERS- CONTRAINDICATED  . Depression   . Fibromyalgia   . Frequency of urination   . GERD (gastroesophageal reflux disease)   . Heart murmur   . History of pancreatitis 1988-1989  . Hyperlipidemia   . Hypertension LABILE  . IBS (irritable bowel syndrome)   . Interstitial cystitis   . Lumbar spondylosis   . Migraines   . Moderate persistent asthma    ASTHMA IMPROVED NO RECENT PULMONOLOGY VISITS  . Multiple pulmonary nodules   . Normal cardiac stress test JULY 2009---  NORMAL  . PONV (postoperative nausea and  vomiting)    NAUSEA  . 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 Spasm of sphincter of Oddi 1987  . Urethral stenosis S/P DILATIONS  . Urgency of urination     Past Surgical History:  Procedure Laterality Date  . ABDOMINAL HYSTERECTOMY  1988   COMPLETE  . APPENDECTOMY  04/ 1986  . bilateral reduction mastopexy  1988  . bilateral turbinate resection  1990   nasal spreading graft  . CATARACT EXTRACTION W/ INTRAOCULAR LENS  IMPLANT, BILATERAL  08 and 09/ 2017  . CHOLECYSTECTOMY  04/ 1986  . COLONOSCOPY  02/2004, 11/2010, LAST 03-2016 ENDO DONE ALSO   2005: Normal - Dr. Sammuel Cooper 2012: Normal  . CYSTO WITH HYDRODISTENSION N/A 12/17/2013   Procedure: CYSTO HYDRODISTENSION WITH INSTALLATION OF CHLOROPACTIN AND TETRACAINE;  Surgeon: Malka So, MD;  Location: [facility];  Service: Urology;  Laterality: N/A;  . CYSTO WITH HYDRODISTENSION N/A 06/02/2015   Procedure: CYSTOSCOPY/HYDRODISTENSION WITH INSTILLATION OF CLORPACTIN ;  Surgeon: [name], MD;  Location: [facility];  Service: Urology;  Laterality: N/A;  . CYSTO WITH HYDRODISTENSION N/A 11/08/2016   Procedure: CYSTOSCOPY/HYDRODISTENSION INSTILLATION OF CLORPACTIN;  Surgeon: [name], MD;  Location: [place];  Service: Urology;  Laterality: N/A;  . CYSTO WITH HYDRODISTENSION N/A 03/06/2018   Procedure: CYSTOSCOPY/HYDRODISTENSION INSTILL CHLORPACTION;  Surgeon: [name], MD;  Location: [facility];  Service: Urology;  Laterality: N/A;  . CYSTOSCOPY WITH URETHRAL DILATATION  07/12/2011   Procedure: CYSTOSCOPY WITH URETHRAL DILATATION;  Surgeon: Malka So, MD;  Location: [facility];  Service: Urology;  Laterality: N/A;  HOD AND INSTILLATION OF CHLOROPACTIN C-ARM   . CYSTOSCOPY WITH URETHRAL DILATATION N/A 06/26/2012   Procedure: CYSTOSCOPY WITH URETHRAL DILATATION HYDRODISTENSION AND  INSTILLATION OF CLORPACTION ;  Surgeon: Malka So, MD;  Location: [facility];  Service: Urology;  Laterality: N/A;  CYSTOSCOPY WITH URETHRAL DILATATION HYDRODISTENSION AND INSTILLATION OF CLORPACTION   . CYSTOSTOMY W/ BLADDER BIOPSY  1983  . DG BARIUM SWALLOW ([place] HX)  03/2016  . DILATION AND CURETTAGE OF UTERUS  1987  . ERCP  1988   for pancreatitis with stents   S/P SEVERE PANCREATITIS  . HEMORRHOID SURGERY    . LASIK Bilateral APRIL 2005  . MULTIPLE CYSTO/ HOD/ URETHRAL DILATION/ INSTILLATION CLORPACTIN  .LAST ONE 03-31-2010  . NASAL SINUS SURGERY  1977   sinus cyst  . REDUCTION MAMMAPLASTY Bilateral 1988  . revision rhinoplasty  1978  . RHINOPLASTY  1977  . RIGHT SALPINGOOPHECTOMY  1998  . TONSILLECTOMY AND ADENOIDECTOMY  1963  . VIDEO BRONCHOSCOPY Bilateral 08/23/2016   Procedure: VIDEO BRONCHOSCOPY WITHOUT FLUORO;  Surgeon: Javier Glazier, MD;  Location: [facility];  Service: Cardiopulmonary;  Laterality: Bilateral;      Current Outpatient Medications:  .  acetaminophen (TYLENOL) 325 MG tablet, Take 500 mg by mouth every 6 (six) hours as needed (for headaches). , Disp: , Rfl:  .  albuterol (VENTOLIN HFA) 108 (90 Base) MCG/ACT inhaler, Inhale into the lungs every 6 (six) hours as needed for wheezing or shortness of breath., Disp: , Rfl:  .  ASHWAGANDHA PO, Take 1 capsule by mouth daily., Disp: , Rfl:  .  ezetimibe (ZETIA) 10 MG tablet, Take 10 mg by mouth every evening. , Disp: , Rfl:  .  famotidine (PEPCID) 20 MG tablet, Take 20 mg by mouth at bedtime., Disp: , Rfl:  .  FLUoxetine (PROZAC) 10 MG tablet, TAKE 1 TABLET ONCE DAILY., Disp: 90 tablet, Rfl: 1 .  fluticasone (FLONASE) 50 MCG/ACT nasal spray, Place 2 sprays into both nostrils daily., Disp: , Rfl:  .  folic acid (FOLVITE) 1 MG tablet, Take 1 mg by mouth daily., Disp: , Rfl:  .  LORazepam (ATIVAN) 1 MG tablet, TAKE 1 TABLET BY MOUTH 3 TIMES A DAY., Disp: 90 tablet, Rfl: 0 .  losartan (COZAAR) 50 MG  tablet, Take 50 mg by mouth daily., Disp: , Rfl:  .  MAGNESIUM PO, Take 1 tablet by mouth as needed. , Disp: , Rfl:  .  metoprolol (LOPRESSOR) 50 MG tablet, Take 25 mg by mouth 2 (two) times daily. , Disp: , Rfl:  .  Multiple Vitamins-Minerals (HAIR SKIN AND NAILS FORMULA PO), Take by mouth as needed., Disp: , Rfl:  .  pantoprazole (PROTONIX) 40 MG tablet, 40 mg. , Disp: , Rfl:  .  Probiotic Product (PROBIOTIC PO), Take 1 capsule by mouth every evening. , Disp: , Rfl:  .  UNABLE TO FIND, CALCUIM WITH VITAMIN D GUMMIES BID, Disp: , Rfl:  .  vitamin B-12 (CYANOCOBALAMIN) 1000 MCG tablet, Take 1,000 mcg by mouth daily., Disp: , Rfl:  .  Vitamin D, Ergocalciferol, (DRISDOL) 50000 UNITS CAPS, Take 50,000 Units by mouth every 7 (seven) days.  , Disp: , Rfl:  .  zolpidem (AMBIEN) 5 MG tablet, Take 1 tablet (5 mg total) by mouth at bedtime as needed for sleep., Disp: 30 tablet, Rfl: 0 .  cephALEXin (KEFLEX) 500 MG capsule, Take 1 capsule (500 mg total) by mouth 4 (four) times daily for 5 days., Disp: 20 capsule, Rfl: 0 .  ondansetron (ZOFRAN) 4 MG tablet, Take 1 tablet (4 mg total) by mouth every 8 (eight) hours as needed for up to 5 days., Disp: 15 tablet, Rfl: 0   Objective:   Vitals:   10/07/18 1455  BP: 125/78  Pulse: 69  Temp: 98.1 F (36.7 C)  SpO2: 100%    Physical Exam Vitals signs and nursing note reviewed.  Constitutional:      Appearance: Normal appearance.  HENT:     Head: Normocephalic and atraumatic.     Nose: Nose normal.     Mouth/Throat:     Mouth: Mucous membranes are moist.  Eyes:     Pupils: Pupils are equal, round, and reactive to light.  Neck:     Musculoskeletal: Normal range of motion.  Cardiovascular:     Rate and Rhythm: Normal rate.  Pulmonary:     Effort: Pulmonary effort is normal. No respiratory distress.  Abdominal:     General: Abdomen is flat.  Skin:    General: Skin is warm.     Capillary Refill: Capillary refill takes less than 2 seconds.   Neurological:     General: No focal deficit present.     Mental Status: She is alert and oriented to person, place, and time.  Psychiatric:        Mood and Affect: Mood normal.        Behavior: Behavior normal.        Thought Content: Thought content normal.        Judgment: Judgment normal.     Assessment & Plan:  Symptomatic mammary hypertrophy  Plan for bilateral breast reduction. Prescriptions sent to pharmacy We will need to do an inferior pedicle technique as this the previous type utilized. The risk that can be encountered with breast reduction were discussed and include the following but not limited to these:  Breast asymmetry, fluid accumulation, firmness of the breast, inability to breast feed, loss of nipple or areola, skin loss, decrease or no nipple sensation, fat necrosis of the breast tissue, bleeding, infection, healing delay.  There are risks of anesthesia, changes to skin sensation and injury to nerves or blood vessels.  The muscle can be temporarily or permanently injured.  You may have an allergic reaction to tape, suture, glue, blood products which can result in skin discoloration, swelling, pain, skin lesions, poor healing.  Any of these can lead to the need for revisonal surgery or stage procedures.  A reduction has potential to interfere with diagnostic procedures.  Nipple or breast piercing can increase risks of infection.  This procedure is best done when the breast is fully developed.  Changes in the breast will continue to occur over time.  Pregnancy can alter the outcomes of previous breast reduction surgery, weight gain and weigh loss can also effect the long term appearance.      [place], DO

## 2018-10-13 ENCOUNTER — Encounter (HOSPITAL_BASED_OUTPATIENT_CLINIC_OR_DEPARTMENT_OTHER): Payer: Self-pay | Admitting: *Deleted

## 2018-10-13 ENCOUNTER — Other Ambulatory Visit: Payer: Self-pay

## 2018-10-14 ENCOUNTER — Other Ambulatory Visit (HOSPITAL_COMMUNITY)
Admission: RE | Admit: 2018-10-14 | Discharge: 2018-10-14 | Disposition: A | Discharge status: Home / Self Care | Payer: PPO | Source: Ambulatory Visit | Attending: Plastic Surgery | Admitting: Plastic Surgery

## 2018-10-14 DIAGNOSIS — Z1159 Encounter for screening for other viral diseases: Secondary | ICD-10-CM | POA: Insufficient documentation

## 2018-10-14 DIAGNOSIS — M5412 Radiculopathy, cervical region: Secondary | ICD-10-CM | POA: Diagnosis not present

## 2018-10-15 LAB — NOVEL CORONAVIRUS, NAA (HOSP ORDER, SEND-OUT TO REF LAB; TAT 18-24 HRS): SARS-CoV-2, NAA: NOT DETECTED

## 2018-10-17 ENCOUNTER — Ambulatory Visit (HOSPITAL_BASED_OUTPATIENT_CLINIC_OR_DEPARTMENT_OTHER): Payer: PPO | Admitting: Anesthesiology

## 2018-10-17 ENCOUNTER — Ambulatory Visit (HOSPITAL_BASED_OUTPATIENT_CLINIC_OR_DEPARTMENT_OTHER)
Admission: RE | Admit: 2018-10-17 | Discharge: 2018-10-17 | Disposition: A | Discharge status: Home / Self Care | Payer: PPO | Attending: Plastic Surgery | Admitting: Plastic Surgery

## 2018-10-17 ENCOUNTER — Encounter (HOSPITAL_BASED_OUTPATIENT_CLINIC_OR_DEPARTMENT_OTHER)
Admission: RE | Disposition: A | Discharge status: Home / Self Care | Payer: Self-pay | Source: Home / Self Care | Attending: Plastic Surgery

## 2018-10-17 DIAGNOSIS — R011 Cardiac murmur, unspecified: Secondary | ICD-10-CM | POA: Diagnosis not present

## 2018-10-17 DIAGNOSIS — Z7951 Long term (current) use of inhaled steroids: Secondary | ICD-10-CM | POA: Diagnosis not present

## 2018-10-17 DIAGNOSIS — I10 Essential (primary) hypertension: Secondary | ICD-10-CM | POA: Diagnosis not present

## 2018-10-17 DIAGNOSIS — F419 Anxiety disorder, unspecified: Secondary | ICD-10-CM | POA: Diagnosis not present

## 2018-10-17 DIAGNOSIS — J4540 Moderate persistent asthma, uncomplicated: Secondary | ICD-10-CM | POA: Insufficient documentation

## 2018-10-17 DIAGNOSIS — F329 Major depressive disorder, single episode, unspecified: Secondary | ICD-10-CM | POA: Diagnosis not present

## 2018-10-17 DIAGNOSIS — Z9049 Acquired absence of other specified parts of digestive tract: Secondary | ICD-10-CM | POA: Diagnosis not present

## 2018-10-17 DIAGNOSIS — R3915 Urgency of urination: Secondary | ICD-10-CM | POA: Insufficient documentation

## 2018-10-17 DIAGNOSIS — M1990 Unspecified osteoarthritis, unspecified site: Secondary | ICD-10-CM | POA: Diagnosis not present

## 2018-10-17 DIAGNOSIS — R918 Other nonspecific abnormal finding of lung field: Secondary | ICD-10-CM | POA: Diagnosis not present

## 2018-10-17 DIAGNOSIS — M549 Dorsalgia, unspecified: Secondary | ICD-10-CM | POA: Insufficient documentation

## 2018-10-17 DIAGNOSIS — N3010 Interstitial cystitis (chronic) without hematuria: Secondary | ICD-10-CM | POA: Diagnosis not present

## 2018-10-17 DIAGNOSIS — Z9841 Cataract extraction status, right eye: Secondary | ICD-10-CM | POA: Insufficient documentation

## 2018-10-17 DIAGNOSIS — Z9842 Cataract extraction status, left eye: Secondary | ICD-10-CM | POA: Diagnosis not present

## 2018-10-17 DIAGNOSIS — Z79899 Other long term (current) drug therapy: Secondary | ICD-10-CM | POA: Insufficient documentation

## 2018-10-17 DIAGNOSIS — M542 Cervicalgia: Secondary | ICD-10-CM | POA: Diagnosis not present

## 2018-10-17 DIAGNOSIS — F418 Other specified anxiety disorders: Secondary | ICD-10-CM | POA: Diagnosis not present

## 2018-10-17 DIAGNOSIS — N62 Hypertrophy of breast: Secondary | ICD-10-CM | POA: Insufficient documentation

## 2018-10-17 DIAGNOSIS — N6011 Diffuse cystic mastopathy of right breast: Secondary | ICD-10-CM | POA: Diagnosis not present

## 2018-10-17 DIAGNOSIS — Z8673 Personal history of transient ischemic attack (TIA), and cerebral infarction without residual deficits: Secondary | ICD-10-CM | POA: Diagnosis not present

## 2018-10-17 DIAGNOSIS — K219 Gastro-esophageal reflux disease without esophagitis: Secondary | ICD-10-CM | POA: Diagnosis not present

## 2018-10-17 DIAGNOSIS — M797 Fibromyalgia: Secondary | ICD-10-CM | POA: Insufficient documentation

## 2018-10-17 DIAGNOSIS — E785 Hyperlipidemia, unspecified: Secondary | ICD-10-CM | POA: Diagnosis not present

## 2018-10-17 DIAGNOSIS — R350 Frequency of micturition: Secondary | ICD-10-CM | POA: Insufficient documentation

## 2018-10-17 DIAGNOSIS — Z90710 Acquired absence of both cervix and uterus: Secondary | ICD-10-CM | POA: Insufficient documentation

## 2018-10-17 DIAGNOSIS — J45909 Unspecified asthma, uncomplicated: Secondary | ICD-10-CM | POA: Diagnosis not present

## 2018-10-17 DIAGNOSIS — N6012 Diffuse cystic mastopathy of left breast: Secondary | ICD-10-CM | POA: Diagnosis not present

## 2018-10-17 DIAGNOSIS — K589 Irritable bowel syndrome without diarrhea: Secondary | ICD-10-CM | POA: Insufficient documentation

## 2018-10-17 HISTORY — PX: LIPOSUCTION: SHX10

## 2018-10-17 HISTORY — PX: BREAST REDUCTION SURGERY: SHX8

## 2018-10-17 SURGERY — MAMMOPLASTY, REDUCTION
Anesthesia: General | Site: Breast | Laterality: Bilateral

## 2018-10-17 MED ORDER — ONDANSETRON HCL 4 MG/2ML IJ SOLN: 4.0000 mg | Freq: Four times a day (QID) | INTRAMUSCULAR | Status: DC | PRN

## 2018-10-17 MED ORDER — CEFAZOLIN SODIUM-DEXTROSE 2-4 GM/100ML-% IV SOLN: 2.0000 g | INTRAVENOUS | Status: DC

## 2018-10-17 MED ORDER — SUGAMMADEX SODIUM 200 MG/2ML IV SOLN
INTRAVENOUS | Status: DC | PRN
  Administered 2018-10-17: 300 mg via INTRAVENOUS

## 2018-10-17 MED ORDER — FENTANYL CITRATE (PF) 100 MCG/2ML IJ SOLN
INTRAMUSCULAR | Status: AC
  Filled 2018-10-17: qty 2

## 2018-10-17 MED ORDER — PROPOFOL 10 MG/ML IV BOLUS
INTRAVENOUS | Status: DC | PRN
  Administered 2018-10-17: 150 mg via INTRAVENOUS

## 2018-10-17 MED ORDER — SCOPOLAMINE 1 MG/3DAYS TD PT72
MEDICATED_PATCH | TRANSDERMAL | Status: AC
  Filled 2018-10-17: qty 1

## 2018-10-17 MED ORDER — ROCURONIUM BROMIDE 10 MG/ML (PF) SYRINGE
PREFILLED_SYRINGE | INTRAVENOUS | Status: AC
  Filled 2018-10-17: qty 10

## 2018-10-17 MED ORDER — ACETAMINOPHEN 325 MG PO TABS: 650.0000 mg | ORAL_TABLET | ORAL | Status: DC | PRN

## 2018-10-17 MED ORDER — FENTANYL CITRATE (PF) 100 MCG/2ML IJ SOLN
50.0000 ug | INTRAMUSCULAR | Status: AC | PRN
  Administered 2018-10-17 (×2): 100 ug via INTRAVENOUS
  Administered 2018-10-17 (×2): 50 ug via INTRAVENOUS

## 2018-10-17 MED ORDER — LIDOCAINE HCL (CARDIAC) PF 100 MG/5ML IV SOSY
PREFILLED_SYRINGE | INTRAVENOUS | Status: DC | PRN
  Administered 2018-10-17: 100 mg via INTRAVENOUS

## 2018-10-17 MED ORDER — CHLORHEXIDINE GLUCONATE 4 % EX LIQD: 1.0000 "application " | Freq: Once | CUTANEOUS | Status: DC

## 2018-10-17 MED ORDER — PROPOFOL 500 MG/50ML IV EMUL
INTRAVENOUS | Status: DC | PRN
  Administered 2018-10-17: 200 ug/kg/min via INTRAVENOUS
  Administered 2018-10-17 (×2): via INTRAVENOUS

## 2018-10-17 MED ORDER — SCOPOLAMINE 1 MG/3DAYS TD PT72
1.0000 | MEDICATED_PATCH | Freq: Once | TRANSDERMAL | Status: DC
  Administered 2018-10-17: 11:00:00 1.5 mg via TRANSDERMAL

## 2018-10-17 MED ORDER — LACTATED RINGERS IV SOLN
INTRAVENOUS | Status: DC
  Administered 2018-10-17 (×2): via INTRAVENOUS

## 2018-10-17 MED ORDER — OXYCODONE HCL 5 MG PO TABS: 5.0000 mg | ORAL_TABLET | ORAL | Status: DC | PRN

## 2018-10-17 MED ORDER — ONDANSETRON HCL 4 MG/2ML IJ SOLN
INTRAMUSCULAR | Status: DC | PRN
  Administered 2018-10-17: 4 mg via INTRAVENOUS

## 2018-10-17 MED ORDER — DEXAMETHASONE SODIUM PHOSPHATE 10 MG/ML IJ SOLN
INTRAMUSCULAR | Status: AC
  Filled 2018-10-17: qty 1

## 2018-10-17 MED ORDER — SODIUM CHLORIDE 0.9% FLUSH: 3.0000 mL | INTRAVENOUS | Status: DC | PRN

## 2018-10-17 MED ORDER — ROCURONIUM BROMIDE 100 MG/10ML IV SOLN
INTRAVENOUS | Status: DC | PRN
  Administered 2018-10-17: 20 mg via INTRAVENOUS
  Administered 2018-10-17: 50 mg via INTRAVENOUS

## 2018-10-17 MED ORDER — MIDAZOLAM HCL 2 MG/2ML IJ SOLN
1.0000 mg | INTRAMUSCULAR | Status: DC | PRN
  Administered 2018-10-17: 2 mg via INTRAVENOUS

## 2018-10-17 MED ORDER — ONDANSETRON HCL 4 MG/2ML IJ SOLN
INTRAMUSCULAR | Status: AC
  Filled 2018-10-17: qty 2

## 2018-10-17 MED ORDER — DEXAMETHASONE SODIUM PHOSPHATE 4 MG/ML IJ SOLN
INTRAMUSCULAR | Status: DC | PRN
  Administered 2018-10-17: 10 mg via INTRAVENOUS

## 2018-10-17 MED ORDER — PROPOFOL 10 MG/ML IV BOLUS
INTRAVENOUS | Status: AC
  Filled 2018-10-17: qty 40

## 2018-10-17 MED ORDER — CEFAZOLIN SODIUM-DEXTROSE 2-4 GM/100ML-% IV SOLN
INTRAVENOUS | Status: AC
  Filled 2018-10-17: qty 100

## 2018-10-17 MED ORDER — LIDOCAINE HCL 1 % IJ SOLN
INTRAVENOUS | Status: DC | PRN
  Administered 2018-10-17: 200 mL

## 2018-10-17 MED ORDER — FENTANYL CITRATE (PF) 100 MCG/2ML IJ SOLN
25.0000 ug | INTRAMUSCULAR | Status: DC | PRN
  Administered 2018-10-17 (×2): 50 ug via INTRAVENOUS

## 2018-10-17 MED ORDER — MIDAZOLAM HCL 2 MG/2ML IJ SOLN
INTRAMUSCULAR | Status: AC
  Filled 2018-10-17: qty 2

## 2018-10-17 MED ORDER — SODIUM CHLORIDE 0.9% FLUSH: 3.0000 mL | Freq: Two times a day (BID) | INTRAVENOUS | Status: DC

## 2018-10-17 MED ORDER — SODIUM CHLORIDE 0.9 % IV SOLN: 250.0000 mL | INTRAVENOUS | Status: DC | PRN

## 2018-10-17 MED ORDER — ACETAMINOPHEN 650 MG RE SUPP: 650.0000 mg | RECTAL | Status: DC | PRN

## 2018-10-17 SURGICAL SUPPLY — 74 items
ADH SKN CLS APL DERMABOND .7 (GAUZE/BANDAGES/DRESSINGS) ×4
APL PRP STRL LF DISP 70% ISPRP (MISCELLANEOUS) ×2
BAG DECANTER FOR FLEXI CONT (MISCELLANEOUS) ×2 IMPLANT
BINDER ABDOMINAL  9 SM 30-45 (SOFTGOODS)
BINDER ABDOMINAL 10 UNV 27-48 (MISCELLANEOUS) IMPLANT
BINDER ABDOMINAL 12 ML 46-62 (SOFTGOODS) IMPLANT
BINDER ABDOMINAL 9 SM 30-45 (SOFTGOODS) IMPLANT
BINDER BREAST LRG (GAUZE/BANDAGES/DRESSINGS) IMPLANT
BINDER BREAST MEDIUM (GAUZE/BANDAGES/DRESSINGS) IMPLANT
BINDER BREAST XLRG (GAUZE/BANDAGES/DRESSINGS) ×1 IMPLANT
BINDER BREAST XXLRG (GAUZE/BANDAGES/DRESSINGS) IMPLANT
BIOPATCH RED 1 DISK 7.0 (GAUZE/BANDAGES/DRESSINGS) IMPLANT
BLADE HEX COATED 2.75 (ELECTRODE) ×3 IMPLANT
BLADE KNIFE PERSONA 10 (BLADE) ×7 IMPLANT
BLADE SURG 15 STRL LF DISP TIS (BLADE) ×2 IMPLANT
BLADE SURG 15 STRL SS (BLADE) ×3
BNDG GAUZE ELAST 4 BULKY (GAUZE/BANDAGES/DRESSINGS) ×6 IMPLANT
CANISTER SUCT 1200ML W/VALVE (MISCELLANEOUS) ×3 IMPLANT
CHLORAPREP W/TINT 26 (MISCELLANEOUS) ×3 IMPLANT
COVER BACK TABLE REUSABLE LG (DRAPES) ×3 IMPLANT
COVER MAYO STAND REUSABLE (DRAPES) ×3 IMPLANT
COVER WAND RF STERILE (DRAPES) IMPLANT
DECANTER SPIKE VIAL GLASS SM (MISCELLANEOUS) IMPLANT
DERMABOND ADVANCED (GAUZE/BANDAGES/DRESSINGS) ×2
DERMABOND ADVANCED .7 DNX12 (GAUZE/BANDAGES/DRESSINGS) IMPLANT
DRAIN CHANNEL 19F RND (DRAIN) IMPLANT
DRAPE LAPAROSCOPIC ABDOMINAL (DRAPES) ×3 IMPLANT
DRSG PAD ABDOMINAL 8X10 ST (GAUZE/BANDAGES/DRESSINGS) ×6 IMPLANT
ELECT BLADE 4.0 EZ CLEAN MEGAD (MISCELLANEOUS)
ELECT REM PT RETURN 9FT ADLT (ELECTROSURGICAL) ×3
ELECTRODE BLDE 4.0 EZ CLN MEGD (MISCELLANEOUS) IMPLANT
ELECTRODE REM PT RTRN 9FT ADLT (ELECTROSURGICAL) ×2 IMPLANT
EVACUATOR SILICONE 100CC (DRAIN) IMPLANT
GAUZE SPONGE 4X4 12PLY STRL LF (GAUZE/BANDAGES/DRESSINGS) ×3 IMPLANT
GLOVE BIO SURGEON STRL SZ 6.5 (GLOVE) ×9 IMPLANT
GLOVE SURG SS PI 6.5 STRL IVOR (GLOVE) ×8 IMPLANT
GOWN STRL REUS W/ TWL LRG LVL3 (GOWN DISPOSABLE) ×6 IMPLANT
GOWN STRL REUS W/TWL LRG LVL3 (GOWN DISPOSABLE) ×9
LINER CANISTER 1000CC FLEX (MISCELLANEOUS) ×3 IMPLANT
NDL HYPO 25X1 1.5 SAFETY (NEEDLE) ×2 IMPLANT
NDL SAFETY ECLIPSE 18X1.5 (NEEDLE) ×2 IMPLANT
NEEDLE HYPO 18GX1.5 SHARP (NEEDLE) ×3
NEEDLE HYPO 25X1 1.5 SAFETY (NEEDLE) ×3 IMPLANT
NS IRRIG 1000ML POUR BTL (IV SOLUTION) IMPLANT
PACK BASIN DAY SURGERY FS (CUSTOM PROCEDURE TRAY) ×3 IMPLANT
PAD ALCOHOL SWAB (MISCELLANEOUS) ×3 IMPLANT
PENCIL BUTTON HOLSTER BLD 10FT (ELECTRODE) ×3 IMPLANT
PIN SAFETY STERILE (MISCELLANEOUS) IMPLANT
SLEEVE SCD COMPRESS KNEE MED (MISCELLANEOUS) ×3 IMPLANT
SPONGE LAP 18X18 RF (DISPOSABLE) ×8 IMPLANT
STRIP SUTURE WOUND CLOSURE 1/2 (SUTURE) ×4 IMPLANT
SUT MNCRL AB 4-0 PS2 18 (SUTURE) ×12 IMPLANT
SUT MON AB 3-0 SH 27 (SUTURE) ×9
SUT MON AB 3-0 SH27 (SUTURE) ×2 IMPLANT
SUT MON AB 5-0 PS2 18 (SUTURE) ×11 IMPLANT
SUT PDS 3-0 CT2 (SUTURE)
SUT PDS AB 2-0 CT2 27 (SUTURE) IMPLANT
SUT PDS II 3-0 CT2 27 ABS (SUTURE) IMPLANT
SUT SILK 3 0 PS 1 (SUTURE) IMPLANT
SUT VIC AB 3-0 SH 27 (SUTURE)
SUT VIC AB 3-0 SH 27X BRD (SUTURE) IMPLANT
SUT VICRYL 4-0 PS2 18IN ABS (SUTURE) IMPLANT
SYR 3ML 23GX1 SAFETY (SYRINGE) ×3 IMPLANT
SYR 50ML LL SCALE MARK (SYRINGE) ×2 IMPLANT
SYR BULB IRRIGATION 50ML (SYRINGE) ×3 IMPLANT
SYR CONTROL 10ML LL (SYRINGE) ×3 IMPLANT
SYR TB 1ML LL NO SAFETY (SYRINGE) ×3 IMPLANT
TAPE MEASURE VINYL STERILE (MISCELLANEOUS) ×3 IMPLANT
TOWEL GREEN STERILE FF (TOWEL DISPOSABLE) ×6 IMPLANT
TUBE CONNECTING 20X1/4 (TUBING) ×3 IMPLANT
TUBING INFILTRATION IT-10001 (TUBING) IMPLANT
TUBING SET GRADUATE ASPIR 12FT (MISCELLANEOUS) ×3 IMPLANT
UNDERPAD 30X30 (UNDERPADS AND DIAPERS) ×6 IMPLANT
YANKAUER SUCT BULB TIP NO VENT (SUCTIONS) ×3 IMPLANT

## 2018-10-17 NOTE — Anesthesia Preprocedure Evaluation (Signed)
Anesthesia Evaluation  Patient identified by MRN, date of birth, ID band Patient awake    Reviewed: Allergy & Precautions, H&P , NPO status , Patient's Chart, lab work & pertinent test results  History of Anesthesia Complications (+) PONV and history of anesthetic complications  Airway Mallampati: II   Neck ROM: full    Dental   Pulmonary asthma ,    breath sounds clear to auscultation       Cardiovascular hypertension,  Rhythm:regular Rate:Normal     Neuro/Psych  Headaches, PSYCHIATRIC DISORDERS Anxiety Depression  Neuromuscular disease    GI/Hepatic GERD  ,  Endo/Other    Renal/GU      Musculoskeletal  (+) Arthritis , Fibromyalgia -  Abdominal   Peds  Hematology   Anesthesia Other Findings   Reproductive/Obstetrics                             Anesthesia Physical Anesthesia Plan  ASA: II  Anesthesia Plan: General   Post-op Pain Management:    Induction: Intravenous  PONV Risk Score and Plan: 4 or greater and Ondansetron, Dexamethasone, Midazolam, Scopolamine patch - Pre-op and Treatment may vary due to age or medical condition  Airway Management Planned: Oral ETT  Additional Equipment:   Intra-op Plan:   Post-operative Plan: Extubation in OR  Informed Consent: I have reviewed the patients History and Physical, chart, labs and discussed the procedure including the risks, benefits and alternatives for the proposed anesthesia with the patient or authorized representative who has indicated his/her understanding and acceptance.       Plan Discussed with: CRNA, Anesthesiologist and Surgeon  Anesthesia Plan Comments:         Anesthesia Quick Evaluation

## 2018-10-17 NOTE — Transfer of Care (Signed)
Immediate Anesthesia Transfer of Care Note  Patient: Brenda [name]  Procedure(s) Performed: bilateral breast reduction (Breast) LIPOSUCTION (Bilateral Breast)  Patient Location: PACU  Anesthesia Type:General  Level of Consciousness: awake, alert  and oriented  Airway & Oxygen Therapy: Patient Spontanous Breathing and Patient connected to face mask oxygen  Post-op Assessment: Report given to RN and Post -op Vital signs reviewed and stable  Post vital signs: Reviewed and stable  Last Vitals:  Vitals Value Taken Time  BP    Temp    Pulse 84 10/17/18 1559  Resp    SpO2 100 % 10/17/18 1559  Vitals shown include unvalidated device data.  Last Pain:  Vitals:   10/17/18 1046  TempSrc: Oral  PainSc: 0-No pain         Complications: No apparent anesthesia complications

## 2018-10-17 NOTE — Op Note (Signed)
Breast Reduction Op note:    DATE OF PROCEDURE: 10/17/2018  LOCATION: [place]  SURGEON: Lyndee Leo Sanger Shawndell Varas, DO  ASSISTANT: [place], RNFA  PREOPERATIVE DIAGNOSIS 1. Macromastia 2. Neck Pain 3. Back Pain  POSTOPERATIVE DIAGNOSIS 1. Macromastia 2. Neck Pain 3. Back Pain  PROCEDURES 1. Bilateral breast reduction.  Right reduction 644 g, Left reduction 166 g  COMPLICATIONS: None.  DRAINS: none  INDICATIONS FOR PROCEDURE Brenda [name] is a 68 y.o. year old female born on May 13, 1950, with a history of symptomatic macromastia with concominant back pain, neck pain, shoulder grooving from her bra.   MRN: [id number]  CONSENT Informed consent was obtained directly from the patient. The risks, benefits and alternatives were fully discussed. Specific risks including but not limited to bleeding, infection, hematoma, seroma, scarring, pain, nipple necrosis, asymmetry, poor cosmetic results, and need for further surgery were discussed. The patient had ample opportunity to have her questions answered to her satisfaction. The previous breast reduction  incision sites were utilized as able.  DESCRIPTION OF PROCEDURE  Patient was brought into the operating room and placed in a supine position.  SCDs were placed and appropriate padding was performed.  Antibiotics were given. The patient underwent general anesthesia and the chest was prepped and draped in a sterile fashion.  A timeout was performed and all information was confirmed to be correct. Preoperative markings were confirmed.    Left: Incision lines were injected with 1% Xylocaine with epinephrine.  After waiting for vasoconstriction, the marked lines were incised.  An inferior pedical breast reduction was performed by de-epithelializing the pedicle, using bovie to create the lateral and medial pedicles, and removing breast tissue from the superior, lateral, and medial portions of the breast.  Care was taken  to not undermine the breast pedicle. Hemostasis was achieved.  The nipple was gently rotated into position and the skin was temporarily closed with staples.  The patient was sat upright and size and shape symmetry was confirmed.  The pocket was irrigated and hemostasis confirmed.  The deep tissues were approximated with 3-0 Vicryl sutures and the skin was closed with deep dermal and subcuticular 4-0 Monocryl sutures followed by 5-0 Monocryl.  The nipple areola complex was brought out with the skin de-epithelialized at the location to make place for the complex.  The area was secured with 4-0 Vicryl at the deep layers followed by 5-0 Monocryl.    Right:  Incision lines were injected with 1% Xylocaine with epinephrine.  After waiting for vasoconstriction, the marked lines were incised.  An inferior pedical breast reduction was performed by de-epithelializing the pedicle, using bovie to create the lateral and medial pedicles, and removing breast tissue from the superior, lateral, and medial portions of the breast.  Care was taken to not undermine the breast pedicle. Hemostasis was achieved.  The nipple was gently rotated into position and the skin was temporarily closed with staples.  The patient was sat upright and size and shape symmetry was confirmed.  The pocket was irrigated and hemostasis confirmed.  The deep tissues were approximated with 3-0 Vicryl sutures and the skin was closed with deep dermal and subcuticular 4-0 Monocryl sutures followed by 5-0 Monocryl.  The nipple areola complex was brought out with the skin de-epithelialized at the location to make place for the complex.  The area was secured with 4-0 Vicryl at the deep layers followed by 5-0 Monocryl.  The nipple and skin flaps had good capillary refill at the end  of the procedure. The patient tolerated the procedure well. The patient was allowed to wake from anesthesia and taken to the recovery room in satisfactory condition.  The RNFA assisted  throughout the case.  The RNFA was essential in retraction and counter traction when needed to make the case progress smoothly.  This retraction and assistance made it possible to see the tissue plans for the procedure.  The assistance was needed for blood control, tissue re-approximation and assisted with closure of the incision site.

## 2018-10-17 NOTE — Discharge Instructions (Addendum)
INSTRUCTIONS FOR AFTER SURGERY   You are having surgery.  You will likely have some questions about what to expect following your operation.  The following information will help you and your family understand what to expect when you are discharged from the hospital.  Following these guidelines will help ensure a smooth recovery and reduce risks of complications.   Postoperative instructions include information on: diet, wound care, medications and physical activity.  AFTER SURGERY Expect to go home after the procedure.  In some cases, you may need to spend one night in the hospital for observation.  DIET This surgery does not require a specific diet.  However, I have to mention that the healthier you eat the better your body can start healing. It is important to increasing your protein intake.  This means limiting the foods with sugar and carbohydrates.  Focus on vegetables and some meat.  If you have any liposuction during your procedure be sure to drink water.  If your urine is bright yellow, then it is concentrated, and you need to drink more water.  As a general rule after surgery, you should have 8 ounces of water every hour while awake.  If you find you are persistently nauseated or unable to take in liquids let us know.  NO TOBACCO USE or EXPOSURE.  This will slow your healing process and increase the risk of a wound.  WOUND CARE You can shower the day after surgery.  Use fragrance free soap.  Dial, [place] and [place] are usually mild on the skin.  No baths, pools or hot tubs for two weeks. We close your incision to leave the smallest and best-looking scar. No ointment or creams on your incisions until given the go ahead.  Especially not Neosporin (Too many skin reactions with this one).  A few weeks after surgery you can use Mederma and start massaging the scar. We ask you to wear your binder or sports bra for the first 6 weeks around the clock, including while sleeping. This provides added  comfort and helps reduce the fluid accumulation at the surgery site.  ACTIVITY No heavy lifting until cleared by the doctor.  It is OK to walk and climb stairs. In fact, moving your legs is very important to decrease your risk of a blood clot.  It will also help keep you from getting deconditioned.  Every 1 to 2 hours get up and walk for 5 minutes. This will help with a quicker recovery back to normal.  Let pain be your guide so you don't do too much.  NO, you cannot do the spring cleaning and don't plan on taking care of anyone else.  This is your time for TLC.  You will be more comfortable if you sleep and rest with your head elevated either with a few pillows under you or in a recliner.  No stomach sleeping for a few months.  WORK Everyone returns to work at different times. As a rough guide, most people take at least 1 - 2 weeks off prior to returning to work. If you need documentation for your job, bring the forms to your postoperative follow up visit.  DRIVING Arrange for someone to bring you home from the hospital.  You may be able to drive a few days after surgery but not while taking any narcotics or valium.  BOWEL MOVEMENTS Constipation can occur after anesthesia and while taking pain medication.  It is important to stay ahead for your comfort.  We  recommend taking Milk of Magnesia (2 tablespoons; twice a day) while taking the pain pills.  SEROMA This is fluid your body tried to put in the surgical site.  This is normal but if it creates tight skinny skin let us know.  It usually decreases in a few weeks.  WHEN TO CALL Call your surgeon's office if any of the following occur:  Fever 101 degrees F or greater  Excessive bleeding or fluid from the incision site.  Pain that increases over time without aid from the medications  Redness, warmth, or pus draining from incision sites  Persistent nausea or inability to take in liquids  Severe misshapen area that underwent the  operation.   Post Anesthesia Home Care Instructions  Activity: Get plenty of rest for the remainder of the day. A responsible individual must stay with you for 24 hours following the procedure.  For the next 24 hours, DO NOT: -Drive a car -Paediatric nurse -Drink alcoholic beverages -Take any medication unless instructed by your physician -Make any legal decisions or sign important papers.  Meals: Start with liquid foods such as gelatin or soup. Progress to regular foods as tolerated. Avoid greasy, spicy, heavy foods. If nausea and/or vomiting occur, drink only clear liquids until the nausea and/or vomiting subsides. Call your physician if vomiting continues.  Special Instructions/Symptoms: Your throat may feel dry or sore from the anesthesia or the breathing tube placed in your throat during surgery. If this causes discomfort, gargle with warm salt water. The discomfort should disappear within 24 hours.  If you had a scopolamine patch placed behind your ear for the management of post- operative nausea and/or vomiting:  1. The medication in the patch is effective for 72 hours, after which it should be removed.  Wrap patch in a tissue and discard in the trash. Wash hands thoroughly with soap and water. 2. You may remove the patch earlier than 72 hours if you experience unpleasant side effects which may include dry mouth, dizziness or visual disturbances. 3. Avoid touching the patch. Wash your hands with soap and water after contact with the patch.

## 2018-10-17 NOTE — Anesthesia Procedure Notes (Signed)
Procedure Name: Intubation Performed by: Verita Lamb, CRNA Pre-anesthesia Checklist: Patient identified, Emergency Drugs available, Suction available, Patient being monitored and Timeout performed Patient Re-evaluated:Patient Re-evaluated prior to induction Oxygen Delivery Method: Circle system utilized Preoxygenation: Pre-oxygenation with 100% oxygen Induction Type: IV induction Ventilation: Mask ventilation without difficulty Laryngoscope Size: Glidescope, Mac and 3 Grade View: Grade I Tube type: Oral Tube size: 7.0 mm Number of attempts: 1 Airway Equipment and Method: Rigid stylet Placement Confirmation: ETT inserted through vocal cords under direct vision,  positive ETCO2,  CO2 detector and breath sounds checked- equal and bilateral Secured at: 22 cm Tube secured with: Tape Dental Injury: Teeth and Oropharynx as per pre-operative assessment

## 2018-10-17 NOTE — Interval H&P Note (Signed)
History and Physical Interval Note:  10/17/2018 11:54 AM  Brenda [name]  has presented today for surgery, with the diagnosis of Chronic bilateral thoracic back pain, Neck pain, Symptomatic mammary hypertrophy.  The various methods of treatment have been discussed with the patient and family. After consideration of risks, benefits and other options for treatment, the patient has consented to  Procedure(s) with comments: bilateral breast reduction with liposuction (Bilateral) - please adjust time to reflect 4 hours LIPOSUCTION (N/A) as a surgical intervention.  The patient's history has been reviewed, patient examined, no change in status, stable for surgery.  I have reviewed the patient's chart and labs.  Questions were answered to the patient's satisfaction.     [name]

## 2018-10-18 NOTE — Anesthesia Postprocedure Evaluation (Signed)
Anesthesia Post Note  Patient: Brenda [name]  Procedure(s) Performed: bilateral breast reduction (Breast) LIPOSUCTION (Bilateral Breast)     Patient location during evaluation: PACU Anesthesia Type: General Level of consciousness: awake and alert Pain management: pain level controlled Vital Signs Assessment: post-procedure vital signs reviewed and stable Respiratory status: spontaneous breathing, nonlabored ventilation, respiratory function stable and patient connected to nasal cannula oxygen Cardiovascular status: blood pressure returned to baseline and stable Postop Assessment: no apparent nausea or vomiting Anesthetic complications: no    Last Vitals:  Vitals:   10/17/18 1645 10/17/18 1701  BP: 133/90 (!) 131/91  Pulse: 73 70  Resp: 12 16  Temp:  37.1 C  SpO2: 92% 95%    Last Pain:  Vitals:   10/17/18 1701  TempSrc: Oral  PainSc: [place]

## 2018-10-20 ENCOUNTER — Ambulatory Visit: Payer: PPO

## 2018-10-20 ENCOUNTER — Other Ambulatory Visit: Payer: Self-pay

## 2018-10-20 ENCOUNTER — Telehealth: Payer: Self-pay | Admitting: *Deleted

## 2018-10-20 VITALS — BP 115/75 | HR 64 | Temp 97.9°F | Ht 60.0 in | Wt 151.2 lb

## 2018-10-20 DIAGNOSIS — N62 Hypertrophy of breast: Secondary | ICD-10-CM

## 2018-10-20 NOTE — Progress Notes (Signed)
Pt in office for a concern with her left breast- c/o increased drainage & open wound On assessment there is an open wound left breast mid-inramammry fold- approx 3cm length & 1cm width  Serosanguinous Drainage noted on abd pad from home- approx 37ml Denies any fever, no increased tenderness & no bleeding noted Left breast incision is intact- no drainage noted Photos were obtained & placed in chart- photo sent to Dr. Marla Roe via epic- & she recommended the following: acell powder, adaptic, KY gel & gauze dressing-held in place by surgical breast/chest binder I instructed her to leave the acell, adaptic in place for 3 days- she can change the outer gauze- do not shower for 3 days- then she can shower & change adaptic,ky gel & gauze daily  She has a f/u visit on 10/28/18 I reminded her to call for any further concerns She agrees with plan of care [facility]

## 2018-10-20 NOTE — Telephone Encounter (Signed)
Received a call from the patient and she stated that she had breast reduction w/ liposuction on last Friday, and she has noticed a hold under the (L) breast with drainage.  She has been using a ABD pad but the drainage is coming through the pad.  Spoke with Bonita,RN here in the office and she stated to have the patient to come in and she will look at the area.  Informed the patient of what Bonita,RN said and she agreed.  Patient will be seen today in the office.//AB/CMA

## 2018-10-23 ENCOUNTER — Telehealth: Payer: Self-pay | Admitting: Plastic Surgery

## 2018-10-23 NOTE — Telephone Encounter (Signed)
Received voicemail from patient. Patient is following up from her nurse visit on Monday. Patient states that she was instructed to change bandaging today and would like further instructions. Patient states that her left side is still swollen and she has some concerns. Patient requesting call back on her cell at [phone].

## 2018-10-24 NOTE — Telephone Encounter (Signed)
Called and spoke with the patient on (10/23/18) regarding the message below.  She stated that the (L) breast is still swollen more than the (R), and numbness under the arm.  A lot of pain at night,but she feels better today.  Asked her was is her pain level and she stated that right now (4-5), but last night it was a (8).  She stated that she was told she could change the dressing today.  She said when she started to change it pieces was coming off.  Asked her if she could see the power (Acell), and she stated that she's not sure if the power came out or not.  Asked what did she take off the wound and she stated that she took everything off.  Asked if she took off the adaptic the piece that look like a sheet, and she stated yes she took everything off.  Then she said she replaced the layer (Adaptic) that has the vaseline in it and then placed a small gauze then a large gauze to hold it in.    Asked her if she used KY on top of the adaptic and she stated no because it already has vaseline on it.  I informed her on how to redress the area.  She said she wondered if she needed to come back to be seen.  I informed her that I will speak with Dr. Perfecto Kingdom or [facility] and give her a call back.  She verbalized understanding and agreed.  Called her back and informed her that I spoke with [facility] and she said the breast swelling is normal,the numbness is normal and it's coming from the lipo, and she may feel sharp pain as well and that is normal.  Patient stated okay and she has felt the sharp pain from time to time.  Informed her that per Bonita,RN she's to continue using the adaptic (which is the piece that looks like a sheet with vaseline), and place it on the area, then place [place] on top of the adaptic, then a 4x4 on top of the [place].  I explained to her why she's to use the [facility] because it keeps it moist.  Informed her to change the dressing daily.  Patient mentioned about taking a shower.  She said she has been doing a  bird bath and she will wait until after she comes for her follow-up appointment next week.   I informed the patient that I will check on her on Monday, and if she's not any better we can get her in sooner.  Patient verbalized understanding and agreed.//AB/CMA

## 2018-10-27 ENCOUNTER — Telehealth: Payer: Self-pay | Admitting: Plastic Surgery

## 2018-10-27 DIAGNOSIS — N3010 Interstitial cystitis (chronic) without hematuria: Secondary | ICD-10-CM | POA: Diagnosis not present

## 2018-10-27 NOTE — Telephone Encounter (Signed)

## 2018-10-28 ENCOUNTER — Ambulatory Visit (INDEPENDENT_AMBULATORY_CARE_PROVIDER_SITE_OTHER): Payer: PPO | Admitting: Plastic Surgery

## 2018-10-28 ENCOUNTER — Other Ambulatory Visit: Payer: Self-pay

## 2018-10-28 ENCOUNTER — Encounter: Payer: Self-pay | Admitting: Plastic Surgery

## 2018-10-28 VITALS — BP 110/70 | HR 72 | Temp 98.3°F | Ht 60.0 in | Wt 149.0 lb

## 2018-10-28 DIAGNOSIS — N62 Hypertrophy of breast: Secondary | ICD-10-CM

## 2018-10-28 NOTE — Progress Notes (Signed)
   Subjective:    Patient ID: Brenda [name], female    DOB: 26-Oct-1950, 68 y.o.   MRN: [id number]  The patient is 68 yrs old white female here for follow-up after bilateral breast reduction.  She is overall doing extremely well.  She had a 1 x 2 cm area of opening at the base of the vertical incision on the left breast.  She had donated a cell placed earlier and this has improved greatly.  It does not appear to have any sign of infection.  It is now granulating in very nicely.  She is overall pleased with the size.  There is a little bit of fluid wave noted.  We were able to aspirate serosanguineous fluid and this did help her to feel less tight.     Review of Systems  Constitutional: Negative.  Negative for activity change and appetite change.  Respiratory: Negative.  Negative for chest tightness and shortness of breath.   Cardiovascular: Negative.   Gastrointestinal: Negative.   Musculoskeletal: Negative.   Skin: Positive for wound.  Psychiatric/Behavioral: Negative.        Objective:   Physical Exam Vitals signs and nursing note reviewed.  Constitutional:      Appearance: Normal appearance.  HENT:     Head: Normocephalic and atraumatic.  Cardiovascular:     Rate and Rhythm: Normal rate.     Pulses: Normal pulses.  Pulmonary:     Effort: Pulmonary effort is normal.  Neurological:     General: No focal deficit present.     Mental Status: She is alert. Mental status is at baseline.  Psychiatric:        Mood and Affect: Mood normal.        Behavior: Behavior normal.       Assessment & Plan:     ICD-10-CM   1. Symptomatic mammary hypertrophy  N62   We aspirated 30 cc from both breasts of serosanguineous fluid.  No sign of infection.  I would like to see her back in 1 week.  Donated a cell was applied to the left breast. She is to place ky gel on the left breast daily until Friday and then vaseline.  She can shower starting on Friday

## 2018-10-30 ENCOUNTER — Encounter: Payer: PPO | Admitting: Plastic Surgery

## 2018-10-30 ENCOUNTER — Ambulatory Visit (INDEPENDENT_AMBULATORY_CARE_PROVIDER_SITE_OTHER): Payer: PPO | Admitting: Plastic Surgery

## 2018-10-30 ENCOUNTER — Encounter: Payer: Self-pay | Admitting: Plastic Surgery

## 2018-10-30 ENCOUNTER — Other Ambulatory Visit: Payer: Self-pay

## 2018-10-30 VITALS — BP 119/83 | HR 73 | Temp 98.9°F | Wt 146.0 lb

## 2018-10-30 DIAGNOSIS — N62 Hypertrophy of breast: Secondary | ICD-10-CM

## 2018-10-30 NOTE — Progress Notes (Signed)
   Subjective:    Patient ID: Brenda [name], female    DOB: 1950/09/28, 68 y.o.   MRN: [id number]  The patient is a 68 year old female here for follow-up on her bilateral breast reduction.  She had a little bit of a seroma that was drained on her last visit.  She felt like it was developing again and had some tightness.  There is no sign of infection.  There is no redness or cellulitis.  Overall she is very pleased with her size.  The wound on the left breast at the inframammary fold is improving in depth and size.  No sign of infection there either.   Review of Systems  Constitutional: Negative.   HENT: Negative.   Respiratory: Negative.   Cardiovascular: Negative.   Musculoskeletal: Negative.   Skin: Positive for wound.       Objective:   Physical Exam Vitals signs and nursing note reviewed.  Constitutional:      Appearance: Normal appearance.  HENT:     Head: Normocephalic.  Cardiovascular:     Pulses: Normal pulses.  Pulmonary:     Effort: Pulmonary effort is normal. No respiratory distress.  Neurological:     General: No focal deficit present.     Mental Status: She is alert. Mental status is at baseline.  Psychiatric:        Mood and Affect: Mood normal.        Behavior: Behavior normal.        Assessment & Plan:     ICD-10-CM   1. Symptomatic mammary hypertrophy  N62     We were able to aspirate 50 cc of serosanguineous fluid from the right breast.  I was not able to aspirate any from the left breast.  Donated a cell was applied to the left breast wound.  Continue with KY gel daily.  Shower in 1 week. Follow-up in 2 weeks.

## 2018-11-04 ENCOUNTER — Telehealth: Payer: Self-pay | Admitting: Plastic Surgery

## 2018-11-04 NOTE — Telephone Encounter (Signed)
Received call from patient who stated that her left breast still feels raw. Patient states breast is red and swollen. Patient has appointment scheduled for 11/11/2018 but wants to know if she needs to come in sooner. Patient also concerned about returning to work. Best contact number for patient is [phone]

## 2018-11-05 NOTE — Telephone Encounter (Signed)
Spoke with the patient and she did receive the message below, and agreed.//AB/CMA

## 2018-11-05 NOTE — Telephone Encounter (Signed)
Called patient on (11/04/18) and [facility] @ 4:39pm @ [phone]) regarding the message below.  Informing the patient that per Dr. Faylene Million would like for her to come in on this (Friday-11/07/18) for an office visit, and see Matthew,PA.  We have her scheduled for (8:30am).  Informed the patient that I will give her a call back to make sure she received the message.//AB/CMA

## 2018-11-06 ENCOUNTER — Telehealth: Payer: Self-pay | Admitting: Plastic Surgery

## 2018-11-06 NOTE — Telephone Encounter (Signed)

## 2018-11-06 NOTE — Progress Notes (Signed)
   Subjective:     Patient ID: Brenda [name], female    DOB: [birth year], 68 y.o.   MRN: [id number]  Chief Complaint  Patient presents with  . Follow-up    HPI: The patient is a 68 y.o. yrs old female here for follow up after breast reduction surgery on 10/17/18. Overall, she is doing very well. She is very symmetric and her incisions are healing nicely. She feels as if her breasts are tight, but there are no signs of hematoma, seroma at this time. The wound on her left breast at the base of the vertical limb is granulating and is almost flush with surrounding skin.  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  No fevers, drainage, erythema.   Review of Systems  Constitutional: Positive for weight loss. Negative for chills and fever.  HENT: Negative.   Respiratory: Negative.   Cardiovascular: Negative.   Musculoskeletal: Positive for neck pain. Negative for back pain.  Skin: Negative for itching and rash.  Neurological: Negative for dizziness and headaches.     Objective:   Vital Signs BP 124/83 (BP Location: Left Arm, Patient Position: Sitting, Cuff Size: Normal)   Pulse 73   Temp 98.2 F (36.8 C) (Temporal)   Ht 5' (1.524 m)   Wt 147 lb (66.7 kg)   SpO2 96%   BMI 28.71 kg/m  Vital Signs and Nursing Note Reviewed  Physical Exam  Constitutional: She is oriented to person, place, and time and well-developed, well-nourished, and in no distress. No distress.  HENT:  Head: Normocephalic and atraumatic.  Cardiovascular: Normal rate and intact distal pulses.  Pulses:      Radial pulses are 2+ on the right side and 2+ on the left side.       Dorsalis pedis pulses are 2+ on the right side and 2+ on the left side.  Pulmonary/Chest: Effort normal. No respiratory distress.    Abdominal: She exhibits no distension.  Musculoskeletal:        General: No edema.  Neurological: She is alert and oriented to person, place, and  time. Gait normal.  Skin: Skin is warm and dry. She is not diaphoretic.      Assessment/Plan:     ICD-10-CM   1. Symptomatic mammary hypertrophy  N62   2. Status post bilateral breast reduction  Z98.890    S/p breast reduction  Donated Acell applied to left breast wound. Daily KY jelly and bandage changes. Shower on wednesday and then apply daily Vaseline with dressing changes.   Avoid heavy lifting for a few more weeks, steri strips and dermabond should begin to come off within the next week.  No signs of infection, seroma, hematoma. No need to drain today.  Follow up in 2 weeks. Carola Rhine Josephus Harriger, PA-C 11/07/2018, 8:55 AM

## 2018-11-07 ENCOUNTER — Encounter: Payer: Self-pay | Admitting: Plastic Surgery

## 2018-11-07 ENCOUNTER — Encounter: Payer: Self-pay | Admitting: Surgical

## 2018-11-07 ENCOUNTER — Ambulatory Visit (INDEPENDENT_AMBULATORY_CARE_PROVIDER_SITE_OTHER): Payer: PPO | Admitting: Surgical

## 2018-11-07 ENCOUNTER — Other Ambulatory Visit: Payer: Self-pay

## 2018-11-07 VITALS — BP 124/83 | HR 73 | Temp 98.2°F | Ht 60.0 in | Wt 147.0 lb

## 2018-11-07 DIAGNOSIS — Z9889 Other specified postprocedural states: Secondary | ICD-10-CM

## 2018-11-07 DIAGNOSIS — N62 Hypertrophy of breast: Secondary | ICD-10-CM

## 2018-11-11 ENCOUNTER — Ambulatory Visit: Payer: PPO | Admitting: Nurse Practitioner

## 2018-11-11 ENCOUNTER — Ambulatory Visit (INDEPENDENT_AMBULATORY_CARE_PROVIDER_SITE_OTHER): Payer: PPO | Admitting: Nurse Practitioner

## 2018-11-11 ENCOUNTER — Other Ambulatory Visit: Payer: Self-pay

## 2018-11-11 ENCOUNTER — Encounter: Payer: Self-pay | Admitting: Nurse Practitioner

## 2018-11-11 VITALS — BP 118/75 | HR 66 | Temp 98.8°F | Wt 144.0 lb

## 2018-11-11 DIAGNOSIS — M5412 Radiculopathy, cervical region: Secondary | ICD-10-CM | POA: Diagnosis not present

## 2018-11-11 DIAGNOSIS — N62 Hypertrophy of breast: Secondary | ICD-10-CM

## 2018-11-11 DIAGNOSIS — M542 Cervicalgia: Secondary | ICD-10-CM | POA: Diagnosis not present

## 2018-11-11 DIAGNOSIS — Z9889 Other specified postprocedural states: Secondary | ICD-10-CM

## 2018-11-11 NOTE — Progress Notes (Signed)
Patient ID: Brenda [name], female    DOB: Dec 01, 1950, 68 y.o.   MRN: [id number]   C.C.: fullness in left breast  Brenda [name] is a 68 yo female POD #25 s/p bilateral breast reduction. She presents today for continued follow up. She was seen 4 days ago for concern for breast tightness and wound at left breast. Donated Acell was applied to the left breast wound. She presents today for concern of a heaviness in her left breast. She has some tenderness in both breasts. Patient states the Acell and dressing fell off on Sunday. She has been applying vaseline to the open wounds under the breast.    Review of Systems  Constitutional: Negative.   HENT: Negative.   Respiratory: Negative.   Cardiovascular: Negative.   Gastrointestinal: Negative.   Genitourinary: Negative.   Musculoskeletal: Positive for back pain, neck pain and neck stiffness.  Skin: Positive for wound.  Neurological: Negative.     Past Medical History:  Diagnosis Date  . Anal fissure   . Anemia    PRIOR TO HYSTERECTOMY  . Anxiety   . Arthritis   . Bladder pain   . Cervical disc disease    THINKS C 4 TO C 5  . Chronic seasonal allergic rhinitis   . Complication of anesthesia PAIN ATTACKS   LACTATED RINGERS- CONTRAINDICATED  . Depression   . Fibromyalgia   . Frequency of urination   . GERD (gastroesophageal reflux disease)   . Heart murmur   . History of pancreatitis 1988-1989  . Hyperlipidemia   . Hypertension LABILE  . IBS (irritable bowel syndrome)   . Interstitial cystitis   . Lumbar spondylosis   . Migraines   . Moderate persistent asthma    ASTHMA IMPROVED NO RECENT PULMONOLOGY VISITS  . Multiple pulmonary nodules   . Normal cardiac stress test JULY 2009---  NORMAL  . PONV (postoperative nausea and vomiting)    NAUSEA  . 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 Spasm of sphincter of Oddi 1987  . Urethral stenosis S/P  DILATIONS  . Urgency of urination     Past Surgical History:  Procedure Laterality Date  . ABDOMINAL HYSTERECTOMY  1988   COMPLETE  . APPENDECTOMY  04/ 1986  . bilateral reduction mastopexy  1988  . bilateral turbinate resection  1990   nasal spreading graft  . BREAST REDUCTION SURGERY  10/17/2018   Procedure: bilateral breast reduction;  Surgeon: Wallace Going, DO;  Location: [place];  Service: Plastics;;  please adjust time to reflect 4 hours  . CATARACT EXTRACTION W/ INTRAOCULAR LENS  IMPLANT, BILATERAL  08 and 09/ 2017  . CHOLECYSTECTOMY  04/ 1986  . COLONOSCOPY  02/2004, 11/2010, LAST 03-2016 ENDO DONE ALSO   2005: Normal - Dr. Sammuel Cooper 2012: Normal  . CYSTO WITH HYDRODISTENSION N/A 12/17/2013   Procedure: CYSTO HYDRODISTENSION WITH INSTALLATION OF CHLOROPACTIN AND TETRACAINE;  Surgeon: Malka So, MD;  Location: [facility];  Service: Urology;  Laterality: N/A;  . CYSTO WITH HYDRODISTENSION N/A 06/02/2015   Procedure: CYSTOSCOPY/HYDRODISTENSION WITH INSTILLATION OF CLORPACTIN ;  Surgeon: [name], MD;  Location: [facility];  Service: Urology;  Laterality: N/A;  . CYSTO WITH HYDRODISTENSION N/A 11/08/2016   Procedure: CYSTOSCOPY/HYDRODISTENSION INSTILLATION OF CLORPACTIN;  Surgeon: [name], MD;  Location: [facility];  Service:  Urology;  Laterality: N/A;  . CYSTO WITH HYDRODISTENSION N/A 03/06/2018   Procedure: CYSTOSCOPY/HYDRODISTENSION INSTILL CHLORPACTION;  Surgeon: [name], MD;  Location: [facility];  Service: Urology;  Laterality: N/A;  . CYSTOSCOPY WITH URETHRAL DILATATION  07/12/2011   Procedure: CYSTOSCOPY WITH URETHRAL DILATATION;  Surgeon: Malka So, MD;  Location: [facility];  Service: Urology;  Laterality: N/A;  HOD AND INSTILLATION OF CHLOROPACTIN C-ARM   . CYSTOSCOPY WITH URETHRAL DILATATION N/A 06/26/2012   Procedure: CYSTOSCOPY WITH URETHRAL DILATATION  HYDRODISTENSION AND INSTILLATION OF CLORPACTION ;  Surgeon: Malka So, MD;  Location: [facility];  Service: Urology;  Laterality: N/A;  CYSTOSCOPY WITH URETHRAL DILATATION HYDRODISTENSION AND INSTILLATION OF CLORPACTION   . CYSTOSTOMY W/ BLADDER BIOPSY  1983  . DG BARIUM SWALLOW ([place] HX)  03/2016  . DILATION AND CURETTAGE OF UTERUS  1987  . ERCP  1988   for pancreatitis with stents   S/P SEVERE PANCREATITIS  . HEMORRHOID SURGERY    . LASIK Bilateral APRIL 2005  . LIPOSUCTION Bilateral 10/17/2018   Procedure: LIPOSUCTION;  Surgeon: Wallace Going, DO;  Location: [place];  Service: Plastics;  Laterality: Bilateral;  . MULTIPLE CYSTO/ HOD/ URETHRAL DILATION/ INSTILLATION CLORPACTIN  .LAST ONE 03-31-2010  . NASAL SINUS SURGERY  1977   sinus cyst  . REDUCTION MAMMAPLASTY Bilateral 1988  . revision rhinoplasty  1978  . RHINOPLASTY  1977  . RIGHT SALPINGOOPHECTOMY  1998  . TONSILLECTOMY AND ADENOIDECTOMY  1963  . VIDEO BRONCHOSCOPY Bilateral 08/23/2016   Procedure: VIDEO BRONCHOSCOPY WITHOUT FLUORO;  Surgeon: Javier Glazier, MD;  Location: [facility];  Service: Cardiopulmonary;  Laterality: Bilateral;      Current Outpatient Medications:  .  acetaminophen (TYLENOL) 325 MG tablet, Take 500 mg by mouth every 6 (six) hours as needed (for headaches). , Disp: , Rfl:  .  albuterol (VENTOLIN HFA) 108 (90 Base) MCG/ACT inhaler, Inhale into the lungs every 6 (six) hours as needed for wheezing or shortness of breath., Disp: , Rfl:  .  ASHWAGANDHA PO, Take 1 capsule by mouth daily., Disp: , Rfl:  .  ezetimibe (ZETIA) 10 MG tablet, Take 10 mg by mouth every evening. , Disp: , Rfl:  .  famotidine (PEPCID) 20 MG tablet, Take 20 mg by mouth at bedtime., Disp: , Rfl:  .  FLUoxetine (PROZAC) 10 MG tablet, TAKE 1 TABLET ONCE DAILY., Disp: 90 tablet, Rfl: 1 .  fluticasone (FLONASE) 50 MCG/ACT nasal spray, Place 2 sprays into both nostrils daily., Disp: , Rfl:  .   folic acid (FOLVITE) 1 MG tablet, Take 1 mg by mouth daily., Disp: , Rfl:  .  LORazepam (ATIVAN) 1 MG tablet, TAKE 1 TABLET BY MOUTH 3 TIMES A DAY., Disp: 90 tablet, Rfl: 0 .  losartan (COZAAR) 50 MG tablet, Take 50 mg by mouth daily., Disp: , Rfl:  .  MAGNESIUM PO, Take 1 tablet by mouth as needed. , Disp: , Rfl:  .  metoprolol (LOPRESSOR) 50 MG tablet, Take 25 mg by mouth 2 (two) times daily. , Disp: , Rfl:  .  Multiple Vitamins-Minerals (HAIR SKIN AND NAILS FORMULA PO), Take by mouth as needed., Disp: , Rfl:  .  pantoprazole (PROTONIX) 40 MG tablet, 40 mg. , Disp: , Rfl:  .  Probiotic Product (PROBIOTIC PO), Take 1 capsule by mouth every evening. , Disp: , Rfl:  .  UNABLE TO FIND, CALCUIM WITH VITAMIN D GUMMIES BID, Disp: , Rfl:  .  vitamin B-12 (CYANOCOBALAMIN) 1000 MCG tablet, Take 1,000 mcg by mouth daily., Disp: , Rfl:  .  Vitamin D, Ergocalciferol, (DRISDOL) 50000 UNITS CAPS, Take 50,000 Units by mouth every 7 (seven) days.  , Disp: , Rfl:  .  zolpidem (AMBIEN) 5 MG tablet, Take 1 tablet (5 mg total) by mouth at bedtime as needed for sleep., Disp: 30 tablet, Rfl: 0   Objective:   Vitals:   11/11/18 1525  BP: 118/75  Pulse: 66  Temp: 98.8 F (37.1 C)  SpO2: 100%    Physical Exam  General: alert, calm, no acute distress HEENT:normal Neck: supple, full ROM Chest: symmetrical rise and fall Lungs: unlabored breathing Breast: open wound at base of left breast incision, mild erythema on breast superior to incision, tender to touch; small open wound at base of right breast; mild bruising on bilateral breasts Cardiac: +2 bilateral radial pulse Musculoskeletal: MAEx4, muscle tightness of bilateral latisimus Neuro: A&O x3, calm, cooperative, steady gait   Assessment & Plan:  Nini Cavan is a 68 yo female POD #25 s/p bilateral breast reduction. She has some fat necrosis around the nipples and small open wounds at her incisions. No signs of fluid accumulation. She was encouraged to  massage the hardened areas on her nipples and breasts. Her back and shoulder muscles are extremely tight, which may be contributing the tightness she feels. She was also encouraged to have someone massage her neck and shoulder area. Continue applying vaseline to wounds. Follow up 1 week.     Alfredo Batty, NP

## 2018-11-20 DIAGNOSIS — N3010 Interstitial cystitis (chronic) without hematuria: Secondary | ICD-10-CM | POA: Diagnosis not present

## 2018-11-20 NOTE — Progress Notes (Signed)
   Subjective:     Patient ID: Brenda [name], female    DOB: [birth year], 68 y.o.   MRN: [id number]  Chief Complaint  Patient presents with  . Follow-up    on (B) breast reduction    HPI: The patient is a 68 y.o. female here for follow-up after bilateral breast reduction on October 17, 2018.  At her last visit she was having some back and shoulder pain/tightness and Acell was applied to her left breast wound.  Brenda [name] is doing very well.  Her incision is are healing nicely and the wound under her left breast at the inframammary fold and vertical in junction has improved.  She has been doing daily Vaseline to the area.  No sign of infection.  She denies any fevers/chills/nausea/vomiting.  No sign of hematoma or seroma.  Review of Systems  Constitutional: Negative for chills and fever.  Respiratory: Negative.   Cardiovascular: Negative.   Musculoskeletal: Negative.   Skin: Negative for itching and rash.  Neurological: Negative.   Psychiatric/Behavioral: Negative.      Objective:   Vital Signs BP 107/70 (BP Location: Left Arm, Patient Position: Sitting, Cuff Size: Normal)   Pulse 68   Temp 97.7 F (36.5 C) (Temporal)   Ht 5' (1.524 m)   Wt 147 lb 6.4 oz (66.9 kg)   SpO2 95%   BMI 28.79 kg/m  Vital Signs and Nursing Note Reviewed  Physical Exam  Constitutional: She is oriented to person, place, and time. No distress.  HENT:  Head: Normocephalic and atraumatic.  Neck: Normal range of motion.  Cardiovascular: Normal rate.  Pulmonary/Chest: Effort normal.    Musculoskeletal: Normal range of motion.  Neurological: She is alert and oriented to person, place, and time. Gait normal.  Skin: Skin is warm and dry. No rash noted. She is not diaphoretic. No erythema.  Psychiatric: Mood and affect normal.    Assessment/Plan:     ICD-10-CM   1. Symptomatic mammary hypertrophy  N62   2. Status post bilateral breast reduction  Z98.890     Brenda [name] is doing very well.  Her incisions are healing nicely.   The wound under her left breast at the junction of the inframammary fold and vertical limb has improved and there is only a small wound that is scabbing over. Continue with Vaseline  We removed some sutures from her lateral axillary incisions today.  She can begin the exercise with her work on [place].  She should start with half the way she used to do and work her way up depending on her level of pain and tolerance.  She can go back to work as long as she does not lift anything too heavy.  She works as a Marine scientist at [employer].  Follow-up in 3 months.   Carola Rhine Anamaria Dusenbury, PA-C 11/21/2018, 10:18 AM

## 2018-11-21 ENCOUNTER — Encounter: Payer: Self-pay | Admitting: Surgical

## 2018-11-21 ENCOUNTER — Ambulatory Visit (INDEPENDENT_AMBULATORY_CARE_PROVIDER_SITE_OTHER): Payer: PPO | Admitting: Surgical

## 2018-11-21 ENCOUNTER — Other Ambulatory Visit: Payer: Self-pay

## 2018-11-21 ENCOUNTER — Encounter: Payer: Self-pay | Admitting: Plastic Surgery

## 2018-11-21 VITALS — BP 107/70 | HR 68 | Temp 97.7°F | Ht 60.0 in | Wt 147.4 lb

## 2018-11-21 DIAGNOSIS — N62 Hypertrophy of breast: Secondary | ICD-10-CM

## 2018-11-21 DIAGNOSIS — Z9889 Other specified postprocedural states: Secondary | ICD-10-CM

## 2018-12-01 ENCOUNTER — Other Ambulatory Visit: Payer: Self-pay | Admitting: Psychiatry

## 2018-12-02 ENCOUNTER — Other Ambulatory Visit: Payer: Self-pay

## 2018-12-02 ENCOUNTER — Encounter: Payer: Self-pay | Admitting: Nurse Practitioner

## 2018-12-02 ENCOUNTER — Ambulatory Visit (INDEPENDENT_AMBULATORY_CARE_PROVIDER_SITE_OTHER): Payer: PPO | Admitting: Nurse Practitioner

## 2018-12-02 VITALS — BP 130/74 | HR 68 | Temp 97.3°F | Wt 147.8 lb

## 2018-12-02 DIAGNOSIS — Z9889 Other specified postprocedural states: Secondary | ICD-10-CM

## 2018-12-02 NOTE — Progress Notes (Signed)
Patient ID: Brenda [name], female    DOB: 1950/11/19, 68 y.o.   MRN: [id number]   Chief Complaint  Patient presents with  . Follow-up   Brenda [name] is a 68 yo female who presents for follow up after her bilateral breast reduction on 10/17/18. She developed a wound on her left breast inframammary fold. Donated Acell was applied to the wound on 7/10. The wound has almost completely healed She is now putting mederma on the scars. She states her right breast is "very hard and sore." She states the right breast has become increasingly firm over the past 2.5 weeks. She has been massaging both breasts multiple times a day. She has a mole on her right areola and wants to know if it should be biopsied. 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    Review of Systems  Constitutional: Positive for activity change.  HENT: Negative.   Respiratory: Negative.   Cardiovascular: Negative.   Gastrointestinal: Negative.   Genitourinary: Negative.   Musculoskeletal: Positive for neck pain.       Right breast hard and sore  Skin: Positive for wound.  Neurological: Negative.     Past Medical History:  Diagnosis Date  . Anal fissure   . Anemia    PRIOR TO HYSTERECTOMY  . Anxiety   . Arthritis   . Bladder pain   . Cervical disc disease    THINKS C 4 TO C 5  . Chronic seasonal allergic rhinitis   . Complication of anesthesia PAIN ATTACKS   LACTATED RINGERS- CONTRAINDICATED  . Depression   . Fibromyalgia   . Frequency of urination   . GERD (gastroesophageal reflux disease)   . Heart murmur   . History of pancreatitis 1988-1989  . Hyperlipidemia   . Hypertension LABILE  . IBS (irritable bowel syndrome)   . Interstitial cystitis   . Lumbar spondylosis   . Migraines   . Moderate persistent asthma    ASTHMA IMPROVED NO RECENT PULMONOLOGY VISITS  . Multiple pulmonary nodules   . Normal cardiac stress test JULY 2009---  NORMAL  . PONV (postoperative nausea and vomiting)    NAUSEA  . Remote  history of stroke 1996-   MILD  W/ NO RESIDUALS   AND PER SCAN CVA  IN 2010  (INFARCTION LEFT THALAMUS)  . Spasm of sphincter of Oddi 1987  . Urethral stenosis S/P DILATIONS  . Urgency of urination     Past Surgical History:  Procedure Laterality Date  . ABDOMINAL HYSTERECTOMY  1988   COMPLETE  . APPENDECTOMY  04/ 1986  . bilateral reduction mastopexy  1988  . bilateral turbinate resection  1990   nasal spreading graft  . BREAST REDUCTION SURGERY  10/17/2018   Procedure: bilateral breast reduction;  Surgeon: Wallace Going, DO;  Location: [place];  Service: Plastics;;  please adjust time to reflect 4 hours  . CATARACT EXTRACTION W/ INTRAOCULAR LENS  IMPLANT, BILATERAL  08 and 09/ 2017  . CHOLECYSTECTOMY  04/ 1986  . COLONOSCOPY  02/2004, 11/2010, LAST 03-2016 ENDO DONE ALSO   2005: Normal - Dr. Sammuel Cooper 2012: Normal  . CYSTO WITH HYDRODISTENSION N/A 12/17/2013   Procedure: CYSTO HYDRODISTENSION WITH INSTALLATION OF CHLOROPACTIN AND TETRACAINE;  Surgeon: Malka So, MD;  Location: [facility];  Service: Urology;  Laterality: N/A;  . CYSTO WITH HYDRODISTENSION N/A 06/02/2015   Procedure: CYSTOSCOPY/HYDRODISTENSION WITH INSTILLATION OF CLORPACTIN ;  Surgeon: [name],  MD;  Location: [name];  Service: Urology;  Laterality: N/A;  . CYSTO WITH HYDRODISTENSION N/A 11/08/2016   Procedure: CYSTOSCOPY/HYDRODISTENSION INSTILLATION OF CLORPACTIN;  Surgeon: [name], MD;  Location: [facility];  Service: Urology;  Laterality: N/A;  . CYSTO WITH HYDRODISTENSION N/A 03/06/2018   Procedure: CYSTOSCOPY/HYDRODISTENSION INSTILL CHLORPACTION;  Surgeon: [name], MD;  Location: [facility];  Service: Urology;  Laterality: N/A;  . CYSTOSCOPY WITH URETHRAL DILATATION  07/12/2011   Procedure: CYSTOSCOPY WITH URETHRAL DILATATION;  Surgeon: Malka So, MD;  Location: [facility];  Service: Urology;   Laterality: N/A;  HOD AND INSTILLATION OF CHLOROPACTIN C-ARM   . CYSTOSCOPY WITH URETHRAL DILATATION N/A 06/26/2012   Procedure: CYSTOSCOPY WITH URETHRAL DILATATION HYDRODISTENSION AND INSTILLATION OF CLORPACTION ;  Surgeon: Malka So, MD;  Location: [facility];  Service: Urology;  Laterality: N/A;  CYSTOSCOPY WITH URETHRAL DILATATION HYDRODISTENSION AND INSTILLATION OF CLORPACTION   . CYSTOSTOMY W/ BLADDER BIOPSY  1983  . DG BARIUM SWALLOW ([place] HX)  03/2016  . DILATION AND CURETTAGE OF UTERUS  1987  . ERCP  1988   for pancreatitis with stents   S/P SEVERE PANCREATITIS  . HEMORRHOID SURGERY    . LASIK Bilateral APRIL 2005  . LIPOSUCTION Bilateral 10/17/2018   Procedure: LIPOSUCTION;  Surgeon: Wallace Going, DO;  Location: [place];  Service: Plastics;  Laterality: Bilateral;  . MULTIPLE CYSTO/ HOD/ URETHRAL DILATION/ INSTILLATION CLORPACTIN  .LAST ONE 03-31-2010  . NASAL SINUS SURGERY  1977   sinus cyst  . REDUCTION MAMMAPLASTY Bilateral 1988  . revision rhinoplasty  1978  . RHINOPLASTY  1977  . RIGHT SALPINGOOPHECTOMY  1998  . TONSILLECTOMY AND ADENOIDECTOMY  1963  . VIDEO BRONCHOSCOPY Bilateral 08/23/2016   Procedure: VIDEO BRONCHOSCOPY WITHOUT FLUORO;  Surgeon: Javier Glazier, MD;  Location: [facility];  Service: Cardiopulmonary;  Laterality: Bilateral;      Current Outpatient Medications:  .  acetaminophen (TYLENOL) 325 MG tablet, Take 500 mg by mouth every 6 (six) hours as needed (for headaches). , Disp: , Rfl:  .  albuterol (VENTOLIN HFA) 108 (90 Base) MCG/ACT inhaler, Inhale into the lungs every 6 (six) hours as needed for wheezing or shortness of breath., Disp: , Rfl:  .  ASHWAGANDHA PO, Take 1 capsule by mouth daily., Disp: , Rfl:  .  ezetimibe (ZETIA) 10 MG tablet, Take 10 mg by mouth every evening. , Disp: , Rfl:  .  famotidine (PEPCID) 20 MG tablet, Take 20 mg by mouth at bedtime., Disp: , Rfl:  .  FLUoxetine (PROZAC) 10 MG  tablet, TAKE 1 TABLET ONCE DAILY., Disp: 90 tablet, Rfl: 1 .  fluticasone (FLONASE) 50 MCG/ACT nasal spray, Place 2 sprays into both nostrils daily., Disp: , Rfl:  .  folic acid (FOLVITE) 1 MG tablet, Take 1 mg by mouth daily., Disp: , Rfl:  .  LORazepam (ATIVAN) 1 MG tablet, TAKE 1 TABLET BY MOUTH 3 TIMES A DAY., Disp: 90 tablet, Rfl: 0 .  losartan (COZAAR) 50 MG tablet, Take 50 mg by mouth daily., Disp: , Rfl:  .  MAGNESIUM PO, Take 1 tablet by mouth as needed. , Disp: , Rfl:  .  metoprolol (LOPRESSOR) 50 MG tablet, Take 25 mg by mouth 2 (two) times daily. , Disp: , Rfl:  .  Multiple Vitamins-Minerals (HAIR SKIN AND NAILS FORMULA PO), Take by mouth as needed., Disp: , Rfl:  .  pantoprazole (PROTONIX) 40 MG  tablet, 40 mg. , Disp: , Rfl:  .  Probiotic Product (PROBIOTIC PO), Take 1 capsule by mouth every evening. , Disp: , Rfl:  .  UNABLE TO FIND, CALCUIM WITH VITAMIN D GUMMIES BID, Disp: , Rfl:  .  vitamin B-12 (CYANOCOBALAMIN) 1000 MCG tablet, Take 1,000 mcg by mouth daily., Disp: , Rfl:  .  Vitamin D, Ergocalciferol, (DRISDOL) 50000 UNITS CAPS, Take 50,000 Units by mouth every 7 (seven) days.  , Disp: , Rfl:  .  zolpidem (AMBIEN) 5 MG tablet, Take 1 tablet (5 mg total) by mouth at bedtime as needed for sleep., Disp: 30 tablet, Rfl: 0   Objective:   Vitals:   12/02/18 1416  BP: 130/74  Pulse: 68  Temp: (!) 97.3 F (36.3 C)  SpO2: 99%    Physical Exam  General: alert, calm, no acute distress Chest: symmetrical rise and fall Lungs: unlabored breathing Breast: incision clean, dry, intact, 5 mm x 5 mm pink wound bed with surrounding epithelialized tissue at left inframammary fold; left nipple firm between 6 and 9 o'clock; right breast firm and tender between 7 and 12 o'clock  Musculoskeletal: MAEx4 Skin: 5 mm x 84mm light brown flat lesion on right areola Neuro: A&O x3, calm, cooperative, steady gait    Assessment & Plan:  Status post bilateral breast reduction   Nyna Chilton  is a 68 yo female s/p bilateral breast reduction on 10/17/18. She has areas of fat necrosis at both breasts, but worse on the right. The firmness may get better with time. If still present in >3 months, we can discuss surgical options to break up the fat. The mole on her right nipple does not look overly concerning. We can plan to biopsy around that same time, if desired. Return in 3 months.    Picture placed in chart with patient's consent.  Alfredo Batty, NP

## 2018-12-04 ENCOUNTER — Encounter: Payer: Self-pay | Admitting: Psychiatry

## 2018-12-04 ENCOUNTER — Ambulatory Visit (INDEPENDENT_AMBULATORY_CARE_PROVIDER_SITE_OTHER): Payer: PPO | Admitting: Psychiatry

## 2018-12-04 ENCOUNTER — Other Ambulatory Visit: Payer: Self-pay

## 2018-12-04 DIAGNOSIS — F330 Major depressive disorder, recurrent, mild: Secondary | ICD-10-CM | POA: Diagnosis not present

## 2018-12-04 DIAGNOSIS — F4001 Agoraphobia with panic disorder: Secondary | ICD-10-CM | POA: Diagnosis not present

## 2018-12-04 DIAGNOSIS — F5105 Insomnia due to other mental disorder: Secondary | ICD-10-CM | POA: Diagnosis not present

## 2018-12-04 DIAGNOSIS — F411 Generalized anxiety disorder: Secondary | ICD-10-CM

## 2018-12-04 MED ORDER — ZOLPIDEM TARTRATE 5 MG PO TABS: 5.0000 mg | ORAL_TABLET | Freq: Every evening | ORAL | 5 refills | Status: DC | PRN

## 2018-12-04 MED ORDER — LORAZEPAM 1 MG PO TABS: 1.0000 mg | ORAL_TABLET | Freq: Three times a day (TID) | ORAL | 1 refills | Status: DC | PRN

## 2018-12-04 NOTE — Progress Notes (Signed)
Brenda [name] [id number] March 04, 1951 68 y.o.  Subjective:   Patient ID:  Brenda [name] is a 68 y.o. (DOB Apr 04, 1951) female.  Chief Complaint:  Chief Complaint  Patient presents with  . Follow-up    Medication Management  . Anxiety    Medication Management  . Depression    Medication Management    Anxiety Symptoms include nervous/anxious behavior. Patient reports no confusion, decreased concentration, shortness of breath or suicidal ideas.    Depression        Associated symptoms include myalgias.  Associated symptoms include no decreased concentration and no suicidal ideas.  Past medical history includes anxiety.    Brenda [name] presents to the office today for follow-up of anxiety and poor sleep.   Last seen February 2020.  No meds were changed.  At her last visit she decided to try Viibryd after discussions of genetic testing.  She called back 2 weeks later stating she could not tolerate the Viibryd due to loose stool after 1 week of 5mg .  For 3 weeks didn't take anything.  Gradually felt more anxious and losing control.  She returned to the fluoxetine which she has taken most of the last several years.  Has been on it only briefly.  Has seen some reduction in the anxiety since then.  Bad dreams still hot flashes.  Goes to bed thinking of worries.  Sleep not great and not terrib le.  Wants occ Ambien. Doesn't sleep as well if can't take Ambien at times.  No amnesia.  Dreams sometimes awaken her.  Hot flashes awaken her nightly and has them daytime too.  Occ panic out of sleep.  Depressed with no family left and no kids.  Has sold [name] property but ambivalent.  Retired but works 2 days/week.  Depressed some at other times of the year but worse at the holidays.  Still works about 2 days a week.  Better energy overall with less work.  Eats out a lot.  Patient reports stable mood and denies depressed or irritable moods.  Typical levels of anxiety.  Wanted to travel with  retirement and Covid interferes.   Patient denies difficulty with sleep initiation or maintenance with Ambien.  Might give her a little headache. Still some anxiety dreams.  Thinks she needs 2 Ativan daily but takes 1.. Denies appetite disturbance.  Patient reports that energy and motivation have been good.  Patient denies any difficulty with concentration.  Patient denies any suicidal ideation.  She is had multiple med intolerances including venlafaxine which caused vision problems and mental fogginess, Trintellix, duloxetine was blurred vision, paroxetine, Viibryd GI, buspirone, sertraline, fluoxetine, and Lexapro.  She has a history of side effects from trazodone, gabapentin, Valium which caused nightmares, Tranxene which caused tachycardia, lorazepam.  All these meds that she did not tolerate we tried low dosages.  Review of Systems:  Review of Systems  Respiratory: Negative for shortness of breath.   Gastrointestinal: Positive for diarrhea.  Musculoskeletal: Positive for arthralgias, myalgias and neck pain. Negative for back pain.  Neurological: Negative for tremors and weakness.  Psychiatric/Behavioral: Positive for depression, dysphoric mood and sleep disturbance. Negative for agitation, behavioral problems, confusion, decreased concentration, hallucinations, self-injury and suicidal ideas. The patient is nervous/anxious. The patient is not hyperactive.     Medications: I have reviewed the patient's current medications.  Current Outpatient Medications  Medication Sig Dispense Refill  . acetaminophen (TYLENOL) 325 MG tablet Take 500 mg by mouth every 6 (six) hours as needed (  for headaches).     Marland Kitchen albuterol (VENTOLIN HFA) 108 (90 Base) MCG/ACT inhaler Inhale into the lungs every 6 (six) hours as needed for wheezing or shortness of breath.    . ASHWAGANDHA PO Take 1 capsule by mouth daily.    Marland Kitchen ezetimibe (ZETIA) 10 MG tablet Take 10 mg by mouth every evening.     . famotidine (PEPCID) 20 MG  tablet Take 20 mg by mouth at bedtime.    Marland Kitchen FLUoxetine (PROZAC) 10 MG tablet TAKE 1 TABLET ONCE DAILY. 90 tablet 1  . fluticasone (FLONASE) 50 MCG/ACT nasal spray Place 2 sprays into both nostrils daily.    . folic acid (FOLVITE) 1 MG tablet Take 1 mg by mouth daily.    Marland Kitchen LORazepam (ATIVAN) 1 MG tablet TAKE 1 TABLET BY MOUTH 3 TIMES A DAY. 90 tablet 0  . losartan (COZAAR) 50 MG tablet Take 50 mg by mouth daily.    Marland Kitchen MAGNESIUM PO Take 1 tablet by mouth as needed.     . metoprolol (LOPRESSOR) 50 MG tablet Take 25 mg by mouth 2 (two) times daily.     . Multiple Vitamins-Minerals (HAIR SKIN AND NAILS FORMULA PO) Take by mouth as needed.    . pantoprazole (PROTONIX) 40 MG tablet 40 mg.     . Probiotic Product (PROBIOTIC PO) Take 1 capsule by mouth every evening.     Marland Kitchen UNABLE TO FIND CALCUIM WITH VITAMIN D GUMMIES BID    . vitamin B-12 (CYANOCOBALAMIN) 1000 MCG tablet Take 1,000 mcg by mouth daily.    . Vitamin D, Ergocalciferol, (DRISDOL) 50000 UNITS CAPS Take 50,000 Units by mouth every 7 (seven) days.      Marland Kitchen zolpidem (AMBIEN) 5 MG tablet TAKE 1 TABLET AT BEDTIME AS NEEDED FOR INSOMNIA. 30 tablet 0   No current facility-administered medications for this visit.     Medication Side Effects: Other: vivid dreams  Allergies:  Allergies  Allergen Reactions  . Latex Rash  . Adhesive [Tape] Other (See Comments)    TEARS SKIN; PLEASE USE COBAN WRAP!!  . Bupivacaine Other (See Comments)    Other Reaction: PRE-SYNCOPE, TACTYCARDIA TACHYCARDIA/ PRESYNCOPE  . Butorphanol Tartrate Other (See Comments)    NUMBNESS, TACHYCARDIA  . Clarithromycin Nausea Only and Other (See Comments)    TACHYCARDIA  . Codeine Other (See Comments)    SEVERE ELEVATED LIVER ENZYMES  . Cymbalta [Duloxetine Hcl] Other (See Comments)    Blurred vision  . Dilaudid [Hydromorphone Hcl] Itching    SEVERE ITCHING  . Elavil [Amitriptyline Hcl] Other (See Comments)    Elevated liver enzymes  . Hydrocodone Other (See Comments)     Elevated liver enzymes  . Hyoscyamine Sulfate Other (See Comments)    Dizziness, nausea, headache  . Levbid [Hyoscyamine Sulfate] Other (See Comments)    Dizziness, nausea, headache  . Lisinopril Cough    Dry cough  . Marcaine [Bupivacaine Hcl] Other (See Comments)    TACHYCARDIA, PRESYNCOPE  . Pentazocine Lactate Other (See Comments)    Tachycardia   . Percodan [Oxycodone-Aspirin] Nausea Only  . Stadol [Butorphanol Tartrate] Swelling    Lips numb, tongue swell, tachycardia  . Trazodone And Nefazodone Other (See Comments)    Tachycardia   . Vistaril [Hydroxyzine Hcl] Other (See Comments)    Altered sensorium  . Morphine And Related Itching and Rash    Past Medical History:  Diagnosis Date  . Anal fissure   . Anemia    PRIOR TO HYSTERECTOMY  . Anxiety   .  Arthritis   . Bladder pain   . Cervical disc disease    THINKS C 4 TO C 5  . Chronic seasonal allergic rhinitis   . Complication of anesthesia PAIN ATTACKS   LACTATED RINGERS- CONTRAINDICATED  . Depression   . Fibromyalgia   . Frequency of urination   . GERD (gastroesophageal reflux disease)   . Heart murmur   . History of pancreatitis 1988-1989  . Hyperlipidemia   . Hypertension LABILE  . IBS (irritable bowel syndrome)   . Interstitial cystitis   . Lumbar spondylosis   . Migraines   . Moderate persistent asthma    ASTHMA IMPROVED NO RECENT PULMONOLOGY VISITS  . Multiple pulmonary nodules   . Normal cardiac stress test JULY 2009---  NORMAL  . PONV (postoperative nausea and vomiting)    NAUSEA  . 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 Spasm of sphincter of Oddi 1987  . Urethral stenosis S/P DILATIONS  . Urgency of urination     Family History  Problem Relation Age of Onset  . Colon cancer Maternal Grandmother   . Colon cancer Cousin   . Allergies Mother   . Asthma Mother   . Heart disease Mother   . Osteoarthritis Mother    . Diabetes Mother   . Irritable bowel syndrome Mother   . Brain cancer Father   . Osteoarthritis Father   . Colon polyps Father        adenomatous  . Heart disease Father   . Melanoma Sister   . Osteoarthritis Sister   . Irritable bowel syndrome Sister   . Diabetes Sister   . Heart disease Sister   . Stroke Sister   . Ulcerative colitis Maternal Uncle   . Stomach cancer Neg Hx   . Rheumatologic disease Neg Hx     Social History   Socioeconomic History  . Marital status: Married    Spouse name: Not on file  . Number of children: 0  . Years of education: Not on file  . Highest education level: Not on file  Occupational History  . Occupation: Therapist, sports  Social Needs  . Financial resource strain: Not on file  . Food insecurity    Worry: Not on file    Inability: Not on file  . Transportation needs    Medical: Not on file    Non-medical: Not on file  Tobacco Use  . Smoking status: Never Smoker  . Smokeless tobacco: Never Used  . Tobacco comment: Father smoked briefly  Substance and Sexual Activity  . Alcohol use: No  . Drug use: No  . Sexual activity: Not on file  Lifestyle  . Physical activity    Days per week: Not on file    Minutes per session: Not on file  . Stress: Not on file  Relationships  . Social Herbalist on phone: Not on file    Gets together: Not on file    Attends religious service: Not on file    Active member of club or organization: Not on file    Attends meetings of clubs or organizations: Not on file    Relationship status: Not on file  . Intimate partner violence    Fear of current or ex partner: Not on file    Emotionally abused: Not on file    Physically abused: Not on file  Forced sexual activity: Not on file  Other Topics Concern  . Not on file  Social History Narrative   Married, lives with spouse   No children   RN at [employer]   No recent travel      [facility]:   Originally from [place]. Always lived in [place].  Previously has traveled to [place], [place], [place], [place], & mostly [place]. No indoor pets currently. She does have cats in her garage. No bird, mold, or hot tub exposure. No indoor plants. Mostly wood floors. She did have her bedroom carpet taken up in Summer 2017. Has mostly blinds. No wood burning fire place. Previously enjoyed Firefighter & playing piano.     Past Medical History, Surgical history, Social history, and Family history were reviewed and updated as appropriate.   Please see review of systems for further details on the patient's review from today.   Objective:   Physical Exam:  There were no vitals taken for this visit.  Physical Exam Constitutional:      General: She is not in acute distress.    Appearance: She is well-developed.  Musculoskeletal:        General: No deformity.  Neurological:     Mental Status: She is alert and oriented to person, place, and time.     Motor: No tremor.     Coordination: Coordination normal.     Gait: Gait normal.  Psychiatric:        Attention and Perception: Attention and perception normal.        Mood and Affect: Mood is anxious and depressed. Affect is not labile, blunt, angry or inappropriate.        Speech: Speech normal.        Behavior: Behavior normal.        Thought Content: Thought content normal. Thought content is not paranoid. Thought content does not include homicidal or suicidal ideation. Thought content does not include homicidal or suicidal plan.        Cognition and Memory: Cognition normal.        Judgment: Judgment normal.     Comments: Insight and judgment fair to good. No auditory or visual hallucinations. No delusions.  Talkative.  Pleasant.     Lab Review:     Component Value Date/Time   NA 141 03/06/2018 0658   K 4.3 03/06/2018 0658   CL 105 12/04/2016 1351   CO2 23 12/04/2016 1351   GLUCOSE 95 03/06/2018 0658   BUN 12 12/04/2016 1351   CREATININE 0.95 12/04/2016 1351   CALCIUM 9.6  12/04/2016 1351   GFRNONAA >60 12/04/2016 1351   GFRAA >60 12/04/2016 1351       Component Value Date/Time   WBC 7.7 08/12/2017 1211   RBC 4.34 08/12/2017 1211   HGB 13.9 03/06/2018 0658   HCT 41.0 03/06/2018 0658   PLT 327.0 08/12/2017 1211   MCV 93.1 08/12/2017 1211   MCH 31.6 12/04/2016 1351   MCHC 34.0 08/12/2017 1211   RDW 13.7 08/12/2017 1211   LYMPHSABS 1.9 08/12/2017 1211   MONOABS 0.7 08/12/2017 1211   EOSABS 0.1 08/12/2017 1211   BASOSABS 0.1 08/12/2017 1211    No results found for: POCLITH, LITHIUM   No results found for: PHENYTOIN, PHENOBARB, VALPROATE, CBMZ   .res Assessment: Plan:    Brenda [name] was seen today for follow-up, anxiety and depression.  Diagnoses and all orders for this visit:  Mild recurrent major depression ([place])  Panic disorder with agoraphobia  Generalized anxiety disorder  Insomnia due to mental condition  Med Sensitivity complicates treatment  Multiple med failures and intolerances have limited overall effectiveness.  Very medication sensitive. Guarded prognosis for further improvement.  Frustrated with weight gain and blames meds.  Chronic anxiety.  More open to changing the meds DT higher levels of depression.  Varies fluoxetine between 5-10 mg daily.  She's aware this is a low dosage.  Option retry duloxetine.  Not available as liquid.  It helped FM.    Cont Prozac per her request.  Ok prn ambien.  Disc amnesia.  Disc negative effect of food on efficacy and how to deal with it.  Ok. Ativan prn.  We discussed the short-term risks associated with benzodiazepines including sedation and increased fall risk among others.  Discussed long-term side effect risk including dependence, potential withdrawal symptoms, and the potential eventual dose-related risk of dementia.  Supportive therapy dealing with Covid and lack of availability to get out of the house.  This appt was 30 mins.  FU 6 months unless she becomes open to further  changes.  Brenda Parents, MD, DFAPA'    Future Appointments  Date Time Provider [place]  02/20/2019 10:00 AM Scheeler, Carola Rhine, PA-C [facility] None    No orders of the defined types were placed in this encounter.     -------------------------------

## 2018-12-08 DIAGNOSIS — H00015 Hordeolum externum left lower eyelid: Secondary | ICD-10-CM | POA: Diagnosis not present

## 2018-12-09 DIAGNOSIS — K219 Gastro-esophageal reflux disease without esophagitis: Secondary | ICD-10-CM | POA: Diagnosis not present

## 2018-12-09 DIAGNOSIS — M5030 Other cervical disc degeneration, unspecified cervical region: Secondary | ICD-10-CM | POA: Diagnosis not present

## 2018-12-09 DIAGNOSIS — R7301 Impaired fasting glucose: Secondary | ICD-10-CM | POA: Diagnosis not present

## 2018-12-09 DIAGNOSIS — E669 Obesity, unspecified: Secondary | ICD-10-CM | POA: Diagnosis not present

## 2018-12-09 DIAGNOSIS — F39 Unspecified mood [affective] disorder: Secondary | ICD-10-CM | POA: Diagnosis not present

## 2018-12-09 DIAGNOSIS — I10 Essential (primary) hypertension: Secondary | ICD-10-CM | POA: Diagnosis not present

## 2018-12-09 DIAGNOSIS — R05 Cough: Secondary | ICD-10-CM | POA: Diagnosis not present

## 2018-12-09 DIAGNOSIS — E785 Hyperlipidemia, unspecified: Secondary | ICD-10-CM | POA: Diagnosis not present

## 2018-12-12 DIAGNOSIS — N3010 Interstitial cystitis (chronic) without hematuria: Secondary | ICD-10-CM | POA: Diagnosis not present

## 2018-12-16 ENCOUNTER — Telehealth: Payer: Self-pay

## 2018-12-16 NOTE — Telephone Encounter (Signed)
Patient called with a few concerns and questions:  1) Patient wants to know when you can stop wearing the sports bra and move to wearing a normal bra.   2) Patient also explains that she has a large lump in the right breast as well as some few smaller lumps. She is worried with how her breasts look, but she also states that they do not feel right, which is a bigger concern to her. She is complaining of some numbness. Patient also explains that her breasts are sore. Patient has been massaging her breasts thoroughly, but states that she feels as if that isn't helping. Patient wants to know if she needs to be seen earlier than October.

## 2018-12-18 ENCOUNTER — Telehealth: Payer: Self-pay

## 2018-12-18 DIAGNOSIS — M5412 Radiculopathy, cervical region: Secondary | ICD-10-CM | POA: Diagnosis not present

## 2018-12-18 NOTE — Telephone Encounter (Signed)

## 2018-12-19 ENCOUNTER — Other Ambulatory Visit: Payer: Self-pay

## 2018-12-19 ENCOUNTER — Ambulatory Visit (INDEPENDENT_AMBULATORY_CARE_PROVIDER_SITE_OTHER): Payer: PPO | Admitting: Plastic Surgery

## 2018-12-19 ENCOUNTER — Encounter: Payer: Self-pay | Admitting: Plastic Surgery

## 2018-12-19 DIAGNOSIS — Z9889 Other specified postprocedural states: Secondary | ICD-10-CM

## 2018-12-19 NOTE — Progress Notes (Signed)
   Subjective:    Patient ID: Brenda [name], female    DOB: [birth year], 68 y.o.   MRN: [id number]  The patient is joining me via a televisit. She is at work.  She underwent a bilateral repeat breast reduction.  She is concerned about firm areas.  The breasts are sore.  She has been doing some massage.  She is wearing a sports bra.   Review of Systems  Constitutional: Negative.   HENT: Negative.   Eyes: Negative.   Respiratory: Negative.   Cardiovascular: Negative.   Gastrointestinal: Negative.   Genitourinary: Negative.   Musculoskeletal: Negative.        Objective:   Physical Exam Constitutional:      Appearance: Normal appearance.  HENT:     Head: Normocephalic and atraumatic.  Cardiovascular:     Rate and Rhythm: Normal rate.     Pulses: Normal pulses.  Pulmonary:     Effort: Pulmonary effort is normal.  Neurological:     General: No focal deficit present.     Mental Status: She is alert and oriented to person, place, and time.  Psychiatric:        Mood and Affect: Mood normal.        Behavior: Behavior normal.        Assessment & Plan:     ICD-10-CM   1. Status post bilateral breast reduction  Z98.890     Continue massage.  The patient gave consent to have this visit done by telemedicine / virtual visit.  This is also consent for access the chart and treat the patient via this visit. The patient is located at work.  I, the provider, am at the office.  We spent 5 minutes together for the visit.

## 2018-12-24 DIAGNOSIS — H00015 Hordeolum externum left lower eyelid: Secondary | ICD-10-CM | POA: Diagnosis not present

## 2018-12-31 DIAGNOSIS — E7849 Other hyperlipidemia: Secondary | ICD-10-CM | POA: Diagnosis not present

## 2018-12-31 DIAGNOSIS — I10 Essential (primary) hypertension: Secondary | ICD-10-CM | POA: Diagnosis not present

## 2019-01-02 DIAGNOSIS — N3010 Interstitial cystitis (chronic) without hematuria: Secondary | ICD-10-CM | POA: Diagnosis not present

## 2019-01-10 DIAGNOSIS — Z23 Encounter for immunization: Secondary | ICD-10-CM | POA: Diagnosis not present

## 2019-01-12 ENCOUNTER — Other Ambulatory Visit: Payer: Self-pay

## 2019-01-12 ENCOUNTER — Ambulatory Visit (INDEPENDENT_AMBULATORY_CARE_PROVIDER_SITE_OTHER): Payer: PPO | Admitting: Surgical

## 2019-01-12 ENCOUNTER — Encounter: Payer: Self-pay | Admitting: Surgical

## 2019-01-12 VITALS — BP 122/79 | HR 67 | Temp 97.3°F | Ht 60.0 in | Wt 147.0 lb

## 2019-01-12 DIAGNOSIS — Z9889 Other specified postprocedural states: Secondary | ICD-10-CM

## 2019-01-12 NOTE — Progress Notes (Signed)
   Subjective:     Patient ID: Brenda [name], female    DOB: [birth year], 68 y.o.   MRN: [id number]  Chief Complaint  Patient presents with  . Follow-up    for knot in breast    HPI: The patient is a 68 y.o. female here for follow-up on her repeat bilateral breast reduction. She underwent primary reduction many years ago and had her most recent reduction on 10/17/18. She has been massaging the areas of hardness for the past few weeks and has noticed minimal improvement. The right breast has a larger area ~ 6 x 7 cm in size of hardness that is most likely fat necrosis as well as possible adhesions/scarring which is causing her some pain and distorting the breast.  The left breast has a small hard mass consistent with fat necrosis just deep to the NAC.   She has been massaging both areas with Mederma cream.  Review of Systems  Constitutional: Negative for chills, diaphoresis, fever, malaise/fatigue and weight loss.  Respiratory: Negative.   Cardiovascular: Negative.   Genitourinary: Negative.   Musculoskeletal: Negative.        + breast pain   Skin: Negative for itching and rash.  Neurological: Negative.      Objective:   Vital Signs BP 122/79 (BP Location: Left Arm, Patient Position: Sitting, Cuff Size: Normal)   Pulse 67   Temp (!) 97.3 F (36.3 C) (Temporal)   Ht 5' (1.524 m)   Wt 147 lb (66.7 kg)   SpO2 98%   BMI 28.71 kg/m  Vital Signs and Nursing Note Reviewed Chaperone present Physical Exam  Constitutional: She is oriented to person, place, and time and well-developed, well-nourished, and in no distress.  HENT:  Head: Normocephalic and atraumatic.  Cardiovascular: Normal rate.  Pulmonary/Chest: Effort normal.    Musculoskeletal: Normal range of motion.  Neurological: She is alert and oriented to person, place, and time. Gait normal.  Skin: Skin is warm and dry. No rash noted. She is not diaphoretic. No erythema. No pallor.  Incisions c/d/i. No sign of  infection. No erythema or swelling noted.   Psychiatric: Mood and affect normal.      Assessment/Plan:     ICD-10-CM   1. Status post bilateral breast reduction  Z98.890     Continue to massage with Mederma Cream.  Size of fat necrosis has not increased, but it is not improving per patient. She has been massaging daily.  Continue to eat healthy, drink water and take a multivitamin.  Follow up on 01/20/19  Carola Rhine Zephaniah Enyeart, PA-C 01/12/2019, 12:44 PM

## 2019-01-20 ENCOUNTER — Ambulatory Visit (INDEPENDENT_AMBULATORY_CARE_PROVIDER_SITE_OTHER): Payer: PPO | Admitting: Plastic Surgery

## 2019-01-20 ENCOUNTER — Other Ambulatory Visit: Payer: Self-pay

## 2019-01-20 ENCOUNTER — Encounter: Payer: Self-pay | Admitting: Plastic Surgery

## 2019-01-20 VITALS — BP 148/86 | HR 67 | Temp 97.8°F | Ht 60.0 in | Wt 147.0 lb

## 2019-01-20 DIAGNOSIS — N62 Hypertrophy of breast: Secondary | ICD-10-CM

## 2019-01-20 NOTE — Progress Notes (Signed)
   Subjective:    Patient ID: Brenda [name], female    DOB: 13-Jul-1950, 68 y.o.   MRN: [id number]  The patient is a 68 yrs old wf here for follow up on her breast reduction.  She has firm areas in both breasts.  The right breast is mostly firm and the left breast at the 9 o'clock position.     Review of Systems  Constitutional: Negative.   HENT: Negative.   Eyes: Negative.   Respiratory: Negative.   Cardiovascular: Negative.   Gastrointestinal: Negative.   Endocrine: Negative.   Genitourinary: Negative.   Musculoskeletal: Negative.   Psychiatric/Behavioral: Negative.        Objective:   Physical Exam Vitals signs and nursing note reviewed.  Constitutional:      Appearance: Normal appearance.  Neck:     Musculoskeletal: Normal range of motion.  Cardiovascular:     Rate and Rhythm: Normal rate.     Pulses: Normal pulses.  Pulmonary:     Effort: Pulmonary effort is normal.  Neurological:     General: No focal deficit present.     Mental Status: She is alert and oriented to person, place, and time.  Psychiatric:        Mood and Affect: Mood normal.        Behavior: Behavior normal.        Thought Content: Thought content normal.       Assessment & Plan:     ICD-10-CM   1. Symptomatic mammary hypertrophy  N62     Pictures were obtained of the patient and placed in the chart with the patient's or guardian's permission. Patient would like to move toward release of the scar tissue and excision of the fat necrosis.

## 2019-01-22 DIAGNOSIS — I10 Essential (primary) hypertension: Secondary | ICD-10-CM | POA: Diagnosis not present

## 2019-01-22 DIAGNOSIS — M47812 Spondylosis without myelopathy or radiculopathy, cervical region: Secondary | ICD-10-CM | POA: Diagnosis not present

## 2019-01-22 DIAGNOSIS — Z6829 Body mass index (BMI) 29.0-29.9, adult: Secondary | ICD-10-CM | POA: Diagnosis not present

## 2019-01-23 DIAGNOSIS — N3010 Interstitial cystitis (chronic) without hematuria: Secondary | ICD-10-CM | POA: Diagnosis not present

## 2019-02-10 ENCOUNTER — Ambulatory Visit (INDEPENDENT_AMBULATORY_CARE_PROVIDER_SITE_OTHER): Payer: PPO | Admitting: Surgical

## 2019-02-10 ENCOUNTER — Encounter: Payer: Self-pay | Admitting: Surgical

## 2019-02-10 ENCOUNTER — Other Ambulatory Visit: Payer: Self-pay

## 2019-02-10 VITALS — BP 124/72 | HR 74 | Temp 97.5°F | Ht 60.0 in | Wt 149.0 lb

## 2019-02-10 DIAGNOSIS — Z9889 Other specified postprocedural states: Secondary | ICD-10-CM

## 2019-02-10 MED ORDER — KETOROLAC TROMETHAMINE 10 MG PO TABS: 10.0000 mg | ORAL_TABLET | Freq: Three times a day (TID) | ORAL | 0 refills | Status: AC | PRN

## 2019-02-10 MED ORDER — CEPHALEXIN 500 MG PO CAPS: 500.0000 mg | ORAL_CAPSULE | Freq: Two times a day (BID) | ORAL | 0 refills | Status: AC

## 2019-02-10 NOTE — Progress Notes (Signed)
Patient ID: Brenda [name], female    DOB: 12/12/1950, 68 y.o.   MRN: [id number]  Chief Complaint  Patient presents with  . Pre-op Exam    for excision of fat necrosis (B) breast      ICD-10-CM   1. Status post bilateral breast reduction  Z98.890     History of Present Illness: Brenda [name] is a 68 y.o.  female  with a history of mammary hypertrophy who underwent bilateral breast reduction on 6.19.20. She healed well overall, but has developed fat necrosis and scarring resulting in pain with movement.  She presents for preoperative evaluation for upcoming procedure, excision of fat necrosis bilateral breasts, scheduled for 02/16/19 with Dr. Marla Roe.  The patient has had problems with anesthesia - PONV requiring scopolamine patch - which significantly relieves her PONV.  She has a history of HTN, HLD, Asthma, Systolic murmur, CVA (no residual deficits) - no recent events.   She has interstitial cystitis and is scheduled for a bladder instillation procedure this week.  She denies any recent colds or illnesses. Denies any history of DVT/PE. Denies fmhx of dvt/pe. She does not take any blood thinners.  She tolerated her procedure in June of this year very well without post-op complications. She tolerates toradol for pain control as she is allergic to most other pain medications.   She is okay with removal of the majority of the fat necrosis and would be comfortable with an A cup breast.   Past Medical History: Allergies: Allergies  Allergen Reactions  . Latex Rash  . Adhesive [Tape] Other (See Comments)    TEARS SKIN; PLEASE USE COBAN WRAP!!  . Bupivacaine Other (See Comments)    Other Reaction: PRE-SYNCOPE, TACTYCARDIA TACHYCARDIA/ PRESYNCOPE  . Butorphanol Tartrate Other (See Comments)    NUMBNESS, TACHYCARDIA  . Clarithromycin Nausea Only and Other (See Comments)    TACHYCARDIA  . Codeine Other (See Comments)    SEVERE ELEVATED LIVER ENZYMES  . Cymbalta  [Duloxetine Hcl] Other (See Comments)    Blurred vision  . Dilaudid [Hydromorphone Hcl] Itching    SEVERE ITCHING  . Elavil [Amitriptyline Hcl] Other (See Comments)    Elevated liver enzymes  . Hydrocodone Other (See Comments)    Elevated liver enzymes  . Hyoscyamine Sulfate Other (See Comments)    Dizziness, nausea, headache  . Levbid [Hyoscyamine Sulfate] Other (See Comments)    Dizziness, nausea, headache  . Lisinopril Cough    Dry cough  . Marcaine [Bupivacaine Hcl] Other (See Comments)    TACHYCARDIA, PRESYNCOPE  . Pentazocine Lactate Other (See Comments)    Tachycardia   . Percodan [Oxycodone-Aspirin] Nausea Only  . Stadol [Butorphanol Tartrate] Swelling    Lips numb, tongue swell, tachycardia  . Trazodone And Nefazodone Other (See Comments)    Tachycardia   . Vistaril [Hydroxyzine Hcl] Other (See Comments)    Altered sensorium  . Morphine And Related Itching and Rash    Current Medications:  Current Outpatient Medications:  .  acetaminophen (TYLENOL) 325 MG tablet, Take 500 mg by mouth every 6 (six) hours as needed (for headaches). , Disp: , Rfl:  .  albuterol (VENTOLIN HFA) 108 (90 Base) MCG/ACT inhaler, Inhale into the lungs every 6 (six) hours as needed for wheezing or shortness of breath., Disp: , Rfl:  .  ASHWAGANDHA PO, Take 1 capsule by mouth daily., Disp: , Rfl:  .  ezetimibe (ZETIA) 10 MG tablet, Take 10 mg by mouth every evening. , Disp: ,  Rfl:  .  famotidine (PEPCID) 20 MG tablet, Take 20 mg by mouth at bedtime., Disp: , Rfl:  .  FLUoxetine (PROZAC) 10 MG tablet, TAKE 1 TABLET ONCE DAILY., Disp: 90 tablet, Rfl: 1 .  fluticasone (FLONASE) 50 MCG/ACT nasal spray, Place 2 sprays into both nostrils daily., Disp: , Rfl:  .  folic acid (FOLVITE) 1 MG tablet, Take 1 mg by mouth daily., Disp: , Rfl:  .  LORazepam (ATIVAN) 1 MG tablet, Take 1 tablet (1 mg total) by mouth every 8 (eight) hours as needed for anxiety., Disp: 90 tablet, Rfl: 1 .  losartan (COZAAR) 50 MG  tablet, Take 50 mg by mouth daily., Disp: , Rfl:  .  MAGNESIUM PO, Take 1 tablet by mouth as needed. , Disp: , Rfl:  .  metoprolol (LOPRESSOR) 50 MG tablet, Take 25 mg by mouth 2 (two) times daily. , Disp: , Rfl:  .  Multiple Vitamins-Minerals (HAIR SKIN AND NAILS FORMULA PO), Take by mouth as needed., Disp: , Rfl:  .  pantoprazole (PROTONIX) 40 MG tablet, 40 mg. , Disp: , Rfl:  .  Probiotic Product (PROBIOTIC PO), Take 1 capsule by mouth every evening. , Disp: , Rfl:  .  REPATHA SURECLICK [id number] MG/ML SOAJ, Inject SQ into the abdomen every 2 weeks, Disp: , Rfl:  .  UNABLE TO FIND, CALCUIM WITH VITAMIN D GUMMIES BID, Disp: , Rfl:  .  vitamin B-12 (CYANOCOBALAMIN) 1000 MCG tablet, Take 1,000 mcg by mouth daily., Disp: , Rfl:  .  Vitamin D, Ergocalciferol, (DRISDOL) 50000 UNITS CAPS, Take 50,000 Units by mouth every 7 (seven) days.  , Disp: , Rfl:  .  zolpidem (AMBIEN) 5 MG tablet, Take 1 tablet (5 mg total) by mouth at bedtime as needed for sleep., Disp: 30 tablet, Rfl: 5 .  cephALEXin (KEFLEX) 500 MG capsule, Take 1 capsule (500 mg total) by mouth 2 (two) times daily for 3 days., Disp: 6 capsule, Rfl: 0 .  ketorolac (TORADOL) 10 MG tablet, Take 1 tablet (10 mg total) by mouth every 8 (eight) hours as needed for up to 5 days for severe pain., Disp: 15 tablet, Rfl: 0  Past Medical Problems: Past Medical History:  Diagnosis Date  . Anal fissure   . Anemia    PRIOR TO HYSTERECTOMY  . Anxiety   . Arthritis   . Bladder pain   . Cervical disc disease    THINKS C 4 TO C 5  . Chronic seasonal allergic rhinitis   . Complication of anesthesia PAIN ATTACKS   LACTATED RINGERS- CONTRAINDICATED  . Depression   . Fibromyalgia   . Frequency of urination   . GERD (gastroesophageal reflux disease)   . Heart murmur   . History of pancreatitis 1988-1989  . Hyperlipidemia   . Hypertension LABILE  . IBS (irritable bowel syndrome)   . Interstitial cystitis   . Lumbar spondylosis   . Migraines   .  Moderate persistent asthma    ASTHMA IMPROVED NO RECENT PULMONOLOGY VISITS  . Multiple pulmonary nodules   . Normal cardiac stress test JULY 2009---  NORMAL  . PONV (postoperative nausea and vomiting)    NAUSEA  . 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 Spasm of sphincter of Oddi 1987  . Urethral stenosis S/P DILATIONS  . Urgency of urination     Past Surgical History: Past Surgical History:  Procedure Laterality Date  . ABDOMINAL HYSTERECTOMY  1988   COMPLETE  . APPENDECTOMY  04/ 1986  . bilateral reduction mastopexy  1988  . bilateral turbinate resection  1990   nasal spreading graft  . BREAST REDUCTION SURGERY  10/17/2018   Procedure: bilateral breast reduction;  Surgeon: Wallace Going, DO;  Location: [place];  Service: Plastics;;  please adjust time to reflect 4 hours  . CATARACT EXTRACTION W/ INTRAOCULAR LENS  IMPLANT, BILATERAL  08 and 09/ 2017  . CHOLECYSTECTOMY  04/ 1986  . COLONOSCOPY  02/2004, 11/2010, LAST 03-2016 ENDO DONE ALSO   2005: Normal - Dr. Sammuel Cooper 2012: Normal  . CYSTO WITH HYDRODISTENSION N/A 12/17/2013   Procedure: CYSTO HYDRODISTENSION WITH INSTALLATION OF CHLOROPACTIN AND TETRACAINE;  Surgeon: Malka So, MD;  Location: [facility];  Service: Urology;  Laterality: N/A;  . CYSTO WITH HYDRODISTENSION N/A 06/02/2015   Procedure: CYSTOSCOPY/HYDRODISTENSION WITH INSTILLATION OF CLORPACTIN ;  Surgeon: [name], MD;  Location: [facility];  Service: Urology;  Laterality: N/A;  . CYSTO WITH HYDRODISTENSION N/A 11/08/2016   Procedure: CYSTOSCOPY/HYDRODISTENSION INSTILLATION OF CLORPACTIN;  Surgeon: [name], MD;  Location: [facility];  Service: Urology;  Laterality: N/A;  . CYSTO WITH HYDRODISTENSION N/A 03/06/2018   Procedure: CYSTOSCOPY/HYDRODISTENSION INSTILL CHLORPACTION;  Surgeon: [name], MD;  Location: [facility];  Service: Urology;  Laterality: N/A;  . CYSTOSCOPY WITH URETHRAL DILATATION  07/12/2011   Procedure: CYSTOSCOPY WITH URETHRAL DILATATION;  Surgeon: Malka So, MD;  Location: [facility];  Service: Urology;  Laterality: N/A;  HOD AND INSTILLATION OF CHLOROPACTIN C-ARM   . CYSTOSCOPY WITH URETHRAL DILATATION N/A 06/26/2012   Procedure: CYSTOSCOPY WITH URETHRAL DILATATION HYDRODISTENSION AND INSTILLATION OF CLORPACTION ;  Surgeon: Malka So, MD;  Location: [facility];  Service: Urology;  Laterality: N/A;  CYSTOSCOPY WITH URETHRAL DILATATION HYDRODISTENSION AND INSTILLATION OF CLORPACTION   . CYSTOSTOMY W/ BLADDER BIOPSY  1983  . DG BARIUM SWALLOW ([place] HX)  03/2016  . DILATION AND CURETTAGE OF UTERUS  1987  . ERCP  1988   for pancreatitis with stents   S/P SEVERE PANCREATITIS  . HEMORRHOID SURGERY    . LASIK Bilateral APRIL 2005  . LIPOSUCTION Bilateral 10/17/2018   Procedure: LIPOSUCTION;  Surgeon: Wallace Going, DO;  Location: [place];  Service: Plastics;  Laterality: Bilateral;  . MULTIPLE CYSTO/ HOD/ URETHRAL DILATION/ INSTILLATION CLORPACTIN  .LAST ONE 03-31-2010  . NASAL SINUS SURGERY  1977   sinus cyst  . REDUCTION MAMMAPLASTY Bilateral 1988  . revision rhinoplasty  1978  . RHINOPLASTY  1977  . RIGHT SALPINGOOPHECTOMY  1998  . TONSILLECTOMY AND ADENOIDECTOMY  1963  . VIDEO BRONCHOSCOPY Bilateral 08/23/2016   Procedure: VIDEO BRONCHOSCOPY WITHOUT FLUORO;  Surgeon: Javier Glazier, MD;  Location: [facility];  Service: Cardiopulmonary;  Laterality: Bilateral;    Social History: Social History   Socioeconomic History  . Marital status: Married    Spouse name: Not on file  . Number of children: 0  . Years of education: Not on file  . Highest education level: Not on file  Occupational History  . Occupation: Therapist, sports  Social Needs  . Financial resource strain: Not on file  . Food insecurity    Worry:  Not on file    Inability: Not on file  . Transportation needs    Medical: Not on file    Non-medical: Not on  file  Tobacco Use  . Smoking status: Never Smoker  . Smokeless tobacco: Never Used  . Tobacco comment: Father smoked briefly  Substance and Sexual Activity  . Alcohol use: No  . Drug use: No  . Sexual activity: Not on file  Lifestyle  . Physical activity    Days per week: Not on file    Minutes per session: Not on file  . Stress: Not on file  Relationships  . Social Herbalist on phone: Not on file    Gets together: Not on file    Attends religious service: Not on file    Active member of club or organization: Not on file    Attends meetings of clubs or organizations: Not on file    Relationship status: Not on file  . Intimate partner violence    Fear of current or ex partner: Not on file    Emotionally abused: Not on file    Physically abused: Not on file    Forced sexual activity: Not on file  Other Topics Concern  . Not on file  Social History Narrative   Married, lives with spouse   No children   RN at [employer]   No recent travel      [facility]:   Originally from [place]. Always lived in [place]. Previously has traveled to [place], [place], [place], [place], & mostly [place]. No indoor pets currently. She does have cats in her garage. No bird, mold, or hot tub exposure. No indoor plants. Mostly wood floors. She did have her bedroom carpet taken up in Summer 2017. Has mostly blinds. No wood burning fire place. Previously enjoyed Firefighter & playing piano.     Family History: Family History  Problem Relation Age of Onset  . Colon cancer Maternal Grandmother   . Colon cancer Cousin   . Allergies Mother   . Asthma Mother   . Heart disease Mother   . Osteoarthritis Mother   . Diabetes Mother   . Irritable bowel syndrome Mother   . Brain cancer Father   . Osteoarthritis Father   . Colon polyps Father        adenomatous  . Heart  disease Father   . Melanoma Sister   . Osteoarthritis Sister   . Irritable bowel syndrome Sister   . Diabetes Sister   . Heart disease Sister   . Stroke Sister   . Ulcerative colitis Maternal Uncle   . Stomach cancer Neg Hx   . Rheumatologic disease Neg Hx     Review of Systems: Review of Systems  Constitutional: Negative for chills, diaphoresis, fever, malaise/fatigue and weight loss.  Respiratory: Negative for cough, sputum production, shortness of breath and wheezing.   Cardiovascular: Negative for chest pain, claudication, leg swelling and PND.  Gastrointestinal: Negative for abdominal pain, diarrhea, nausea and vomiting.  Musculoskeletal: Positive for myalgias (breast).  Skin: Negative for itching and rash.  Neurological: Positive for sensory change. Negative for dizziness, focal weakness, weakness and headaches.    Physical Exam: Vital Signs BP 124/72 (BP Location: Left Arm, Patient Position: Sitting, Cuff Size: Normal)   Pulse 74   Temp (!) 97.5 F (36.4 C) (Temporal)   Ht 5' (1.524 m)   Wt 149 lb (67.6 kg)   SpO2 98%   BMI 29.10 kg/m  Physical Exam Exam conducted with a chaperone present.  Constitutional:      General: She is not in acute distress.    Appearance:  Normal appearance. She is not ill-appearing.  HENT:     Head: Normocephalic and atraumatic.  Eyes:     Pupils: Pupils are equal, round Neck:     Musculoskeletal: Normal range of motion.  Cardiovascular:     Rate and Rhythm: Normal rate and regular rhythm.     Pulses: Normal pulses.     Heart sounds: Normal heart sounds. No murmur.  Pulmonary:     Effort: Pulmonary effort is normal. No respiratory distress.     Breath sounds: Normal breath sounds. No wheezing.  Breast: Bilateral breast reduction scars, healed well. No drainage, dehiscence noted. Fat necrosis noted b/l breasts with scarring of fat to surround structures causing deformity of right breast with movement. TTP. Abdominal:     General:  Abdomen is flat. There is no distension.     Palpations: Abdomen is soft.     Tenderness: There is no abdominal tenderness.  Musculoskeletal: Normal range of motion.  Skin:    General: Skin is warm and dry.     Findings: No erythema or rash.  Neurological:     General: No focal deficit present.     Mental Status: She is alert and oriented to person, place, and time. Mental status is at baseline.     Motor: No weakness.  Psychiatric:        Mood and Affect: Mood normal.        Behavior: Behavior normal.     Assessment/Plan: Mrs. Gatt is scheduled for Excision of fat necrosis bilateral breasts scheduled for 02/16/19 with Dr. Marla Roe.  Risks, benefits, and alternatives of procedure discussed, questions answered and consent obtained.    Patient understands removal of fat necrosis may decrease breast size and she reports and agrees that she is comfortable with that. She understands drains will be placed. There is a risk of scarring occurring again, as it is a response to trauma to the body.   The consent was obtained with risks and complications reviewed which included bleeding, pain, scar, infection and the risk of anesthesia.  The patients questions were answered to the patients expressed satisfaction.  COVID Scheduled Prescription sent to pharmacy Recommend dvt ppx with SCDs.   Electronically signed by: Carola Rhine Raney Koeppen, PA-C 02/10/2019 6:01 PM

## 2019-02-12 ENCOUNTER — Other Ambulatory Visit (HOSPITAL_COMMUNITY)
Admission: RE | Admit: 2019-02-12 | Discharge: 2019-02-12 | Disposition: A | Discharge status: Home / Self Care | Payer: PPO | Source: Ambulatory Visit | Attending: Plastic Surgery | Admitting: Plastic Surgery

## 2019-02-12 DIAGNOSIS — Z20828 Contact with and (suspected) exposure to other viral communicable diseases: Secondary | ICD-10-CM | POA: Diagnosis not present

## 2019-02-12 DIAGNOSIS — Z01812 Encounter for preprocedural laboratory examination: Secondary | ICD-10-CM | POA: Diagnosis not present

## 2019-02-12 DIAGNOSIS — N3010 Interstitial cystitis (chronic) without hematuria: Secondary | ICD-10-CM | POA: Diagnosis not present

## 2019-02-13 ENCOUNTER — Encounter (HOSPITAL_BASED_OUTPATIENT_CLINIC_OR_DEPARTMENT_OTHER): Payer: Self-pay | Admitting: *Deleted

## 2019-02-13 ENCOUNTER — Other Ambulatory Visit: Payer: Self-pay

## 2019-02-13 ENCOUNTER — Encounter (HOSPITAL_BASED_OUTPATIENT_CLINIC_OR_DEPARTMENT_OTHER): Payer: Self-pay | Admitting: Certified Registered"

## 2019-02-13 NOTE — Progress Notes (Signed)
Spoke w/ via phone for pre-op interview--- PT Lab needs dos----  Istat 8             Lab results------ current ekg in chart and epic COVID test ------ 02-12-2019 Arrive at ------- 0530 NPO after ------ [place] Medications to take morning of surgery ----- Lopressor, Protonix, Prozac, Ativan Diabetic medication ----- n/a Patient Special Instructions ----- bring rescue inhaler with you dos Pre-Op special Istructions ----- no  Patient verbalized understanding of instructions that were given at this phone interview. Patient denies shortness of breath, chest pain, fever, cough a this phone interview.   Anesthesia Review: updated hx in epic. Pt denies any cardiac s&s  PCP: dr r. avva Cardiologist : no Pulmonologist-- dr wert -- asthma, chronic cough and pulm. nodules Chest x-ray :  Chest CT 07-29-2017 epic EKG : 03-06-2018 epic Stress test:  Nuclear 10-2007 epic Echo : no Cardiac Cath :  no Sleep Study/ CPAP : NO Fasting Blood Sugar :    n  / a  Blood Thinner/ Instructions /Last Dose: NO ASA / Instructions/ Last Dose :  NO

## 2019-02-14 LAB — NOVEL CORONAVIRUS, NAA (HOSP ORDER, SEND-OUT TO REF LAB; TAT 18-24 HRS): SARS-CoV-2, NAA: NOT DETECTED

## 2019-02-16 ENCOUNTER — Ambulatory Visit (HOSPITAL_BASED_OUTPATIENT_CLINIC_OR_DEPARTMENT_OTHER): Admission: RE | Admit: 2019-02-16 | Payer: PPO | Source: Home / Self Care | Admitting: Plastic Surgery

## 2019-02-16 HISTORY — DX: Chronic cough: R05.3

## 2019-02-16 HISTORY — DX: Personal history of other mental and behavioral disorders: Z86.59

## 2019-02-16 SURGERY — EXCISION, CYST, BREAST
Anesthesia: General | Site: Breast | Laterality: Bilateral

## 2019-02-18 ENCOUNTER — Telehealth: Payer: Self-pay

## 2019-02-18 DIAGNOSIS — I10 Essential (primary) hypertension: Secondary | ICD-10-CM | POA: Diagnosis not present

## 2019-02-18 NOTE — Telephone Encounter (Signed)
Call to pt to inquire about how she is feeling d/t N&V symptoms that caused her to cancel her surgery with Dr. Marla Roe earlier this week. She reports that the symptoms have resolved & she is feeling better. Her procedure has been rescheduled in November. She is reminded to call for any concerns or questions [place]

## 2019-02-20 ENCOUNTER — Ambulatory Visit: Payer: PPO | Admitting: Surgical

## 2019-02-24 ENCOUNTER — Encounter: Payer: PPO | Admitting: Surgical

## 2019-02-26 DIAGNOSIS — L720 Epidermal cyst: Secondary | ICD-10-CM | POA: Diagnosis not present

## 2019-02-26 DIAGNOSIS — D2271 Melanocytic nevi of right lower limb, including hip: Secondary | ICD-10-CM | POA: Diagnosis not present

## 2019-02-26 DIAGNOSIS — L918 Other hypertrophic disorders of the skin: Secondary | ICD-10-CM | POA: Diagnosis not present

## 2019-02-26 DIAGNOSIS — L821 Other seborrheic keratosis: Secondary | ICD-10-CM | POA: Diagnosis not present

## 2019-02-26 DIAGNOSIS — D2261 Melanocytic nevi of right upper limb, including shoulder: Secondary | ICD-10-CM | POA: Diagnosis not present

## 2019-02-26 DIAGNOSIS — D2262 Melanocytic nevi of left upper limb, including shoulder: Secondary | ICD-10-CM | POA: Diagnosis not present

## 2019-02-26 DIAGNOSIS — D1801 Hemangioma of skin and subcutaneous tissue: Secondary | ICD-10-CM | POA: Diagnosis not present

## 2019-02-26 DIAGNOSIS — D2272 Melanocytic nevi of left lower limb, including hip: Secondary | ICD-10-CM | POA: Diagnosis not present

## 2019-03-01 ENCOUNTER — Other Ambulatory Visit: Payer: Self-pay | Admitting: Psychiatry

## 2019-03-01 HISTORY — PX: REDUCTION MAMMAPLASTY: SUR839

## 2019-03-02 DIAGNOSIS — M47812 Spondylosis without myelopathy or radiculopathy, cervical region: Secondary | ICD-10-CM | POA: Diagnosis not present

## 2019-03-06 DIAGNOSIS — N3010 Interstitial cystitis (chronic) without hematuria: Secondary | ICD-10-CM | POA: Diagnosis not present

## 2019-03-10 DIAGNOSIS — Z124 Encounter for screening for malignant neoplasm of cervix: Secondary | ICD-10-CM | POA: Diagnosis not present

## 2019-03-10 DIAGNOSIS — Z6829 Body mass index (BMI) 29.0-29.9, adult: Secondary | ICD-10-CM | POA: Diagnosis not present

## 2019-03-11 ENCOUNTER — Encounter (HOSPITAL_BASED_OUTPATIENT_CLINIC_OR_DEPARTMENT_OTHER): Payer: Self-pay

## 2019-03-14 ENCOUNTER — Other Ambulatory Visit (HOSPITAL_COMMUNITY)
Admission: RE | Admit: 2019-03-14 | Discharge: 2019-03-14 | Disposition: A | Discharge status: Home / Self Care | Payer: PPO | Source: Ambulatory Visit | Attending: Plastic Surgery | Admitting: Plastic Surgery

## 2019-03-14 DIAGNOSIS — Z01812 Encounter for preprocedural laboratory examination: Secondary | ICD-10-CM | POA: Insufficient documentation

## 2019-03-14 DIAGNOSIS — Z20828 Contact with and (suspected) exposure to other viral communicable diseases: Secondary | ICD-10-CM | POA: Insufficient documentation

## 2019-03-15 LAB — NOVEL CORONAVIRUS, NAA (HOSP ORDER, SEND-OUT TO REF LAB; TAT 18-24 HRS): SARS-CoV-2, NAA: NOT DETECTED

## 2019-03-16 ENCOUNTER — Other Ambulatory Visit: Payer: Self-pay

## 2019-03-16 ENCOUNTER — Other Ambulatory Visit (HOSPITAL_COMMUNITY): Payer: PPO

## 2019-03-16 ENCOUNTER — Encounter (HOSPITAL_BASED_OUTPATIENT_CLINIC_OR_DEPARTMENT_OTHER)
Admission: RE | Admit: 2019-03-16 | Discharge: 2019-03-16 | Disposition: A | Discharge status: Home / Self Care | Payer: PPO | Source: Ambulatory Visit | Attending: Plastic Surgery | Admitting: Plastic Surgery

## 2019-03-16 DIAGNOSIS — Z01810 Encounter for preprocedural cardiovascular examination: Secondary | ICD-10-CM | POA: Diagnosis not present

## 2019-03-16 DIAGNOSIS — N641 Fat necrosis of breast: Secondary | ICD-10-CM | POA: Insufficient documentation

## 2019-03-16 DIAGNOSIS — Z9886 Personal history of breast implant removal: Secondary | ICD-10-CM | POA: Diagnosis not present

## 2019-03-16 MED ORDER — CHLORHEXIDINE GLUCONATE 4 % EX LIQD: 1.0000 "application " | Freq: Once | CUTANEOUS | Status: DC

## 2019-03-18 ENCOUNTER — Ambulatory Visit (HOSPITAL_BASED_OUTPATIENT_CLINIC_OR_DEPARTMENT_OTHER)
Admission: RE | Admit: 2019-03-18 | Discharge: 2019-03-18 | Disposition: A | Discharge status: Home / Self Care | Payer: PPO | Attending: Plastic Surgery | Admitting: Plastic Surgery

## 2019-03-18 ENCOUNTER — Encounter (HOSPITAL_BASED_OUTPATIENT_CLINIC_OR_DEPARTMENT_OTHER): Payer: Self-pay | Admitting: Emergency Medicine

## 2019-03-18 ENCOUNTER — Ambulatory Visit (HOSPITAL_BASED_OUTPATIENT_CLINIC_OR_DEPARTMENT_OTHER): Payer: PPO | Admitting: Anesthesiology

## 2019-03-18 ENCOUNTER — Other Ambulatory Visit: Payer: Self-pay

## 2019-03-18 ENCOUNTER — Encounter (HOSPITAL_BASED_OUTPATIENT_CLINIC_OR_DEPARTMENT_OTHER)
Admission: RE | Disposition: A | Discharge status: Home / Self Care | Payer: Self-pay | Source: Home / Self Care | Attending: Plastic Surgery

## 2019-03-18 DIAGNOSIS — Z91040 Latex allergy status: Secondary | ICD-10-CM | POA: Insufficient documentation

## 2019-03-18 DIAGNOSIS — F418 Other specified anxiety disorders: Secondary | ICD-10-CM | POA: Diagnosis not present

## 2019-03-18 DIAGNOSIS — Z79899 Other long term (current) drug therapy: Secondary | ICD-10-CM | POA: Insufficient documentation

## 2019-03-18 DIAGNOSIS — Z8249 Family history of ischemic heart disease and other diseases of the circulatory system: Secondary | ICD-10-CM | POA: Insufficient documentation

## 2019-03-18 DIAGNOSIS — Z886 Allergy status to analgesic agent status: Secondary | ICD-10-CM | POA: Insufficient documentation

## 2019-03-18 DIAGNOSIS — F419 Anxiety disorder, unspecified: Secondary | ICD-10-CM | POA: Insufficient documentation

## 2019-03-18 DIAGNOSIS — Z9842 Cataract extraction status, left eye: Secondary | ICD-10-CM | POA: Diagnosis not present

## 2019-03-18 DIAGNOSIS — Z885 Allergy status to narcotic agent status: Secondary | ICD-10-CM | POA: Insufficient documentation

## 2019-03-18 DIAGNOSIS — Z91048 Other nonmedicinal substance allergy status: Secondary | ICD-10-CM | POA: Insufficient documentation

## 2019-03-18 DIAGNOSIS — K589 Irritable bowel syndrome without diarrhea: Secondary | ICD-10-CM | POA: Insufficient documentation

## 2019-03-18 DIAGNOSIS — Z8379 Family history of other diseases of the digestive system: Secondary | ICD-10-CM | POA: Insufficient documentation

## 2019-03-18 DIAGNOSIS — Z9049 Acquired absence of other specified parts of digestive tract: Secondary | ICD-10-CM | POA: Insufficient documentation

## 2019-03-18 DIAGNOSIS — Z808 Family history of malignant neoplasm of other organs or systems: Secondary | ICD-10-CM | POA: Diagnosis not present

## 2019-03-18 DIAGNOSIS — J4540 Moderate persistent asthma, uncomplicated: Secondary | ICD-10-CM | POA: Diagnosis not present

## 2019-03-18 DIAGNOSIS — E785 Hyperlipidemia, unspecified: Secondary | ICD-10-CM | POA: Insufficient documentation

## 2019-03-18 DIAGNOSIS — F329 Major depressive disorder, single episode, unspecified: Secondary | ICD-10-CM | POA: Diagnosis not present

## 2019-03-18 DIAGNOSIS — Z9841 Cataract extraction status, right eye: Secondary | ICD-10-CM | POA: Diagnosis not present

## 2019-03-18 DIAGNOSIS — Z823 Family history of stroke: Secondary | ICD-10-CM | POA: Insufficient documentation

## 2019-03-18 DIAGNOSIS — Z8673 Personal history of transient ischemic attack (TIA), and cerebral infarction without residual deficits: Secondary | ICD-10-CM | POA: Diagnosis not present

## 2019-03-18 DIAGNOSIS — I10 Essential (primary) hypertension: Secondary | ICD-10-CM | POA: Insufficient documentation

## 2019-03-18 DIAGNOSIS — Z90710 Acquired absence of both cervix and uterus: Secondary | ICD-10-CM | POA: Diagnosis not present

## 2019-03-18 DIAGNOSIS — G709 Myoneural disorder, unspecified: Secondary | ICD-10-CM | POA: Insufficient documentation

## 2019-03-18 DIAGNOSIS — Z9889 Other specified postprocedural states: Secondary | ICD-10-CM | POA: Diagnosis not present

## 2019-03-18 DIAGNOSIS — J45909 Unspecified asthma, uncomplicated: Secondary | ICD-10-CM | POA: Diagnosis not present

## 2019-03-18 DIAGNOSIS — M797 Fibromyalgia: Secondary | ICD-10-CM | POA: Insufficient documentation

## 2019-03-18 DIAGNOSIS — Z833 Family history of diabetes mellitus: Secondary | ICD-10-CM | POA: Insufficient documentation

## 2019-03-18 DIAGNOSIS — M1990 Unspecified osteoarthritis, unspecified site: Secondary | ICD-10-CM | POA: Insufficient documentation

## 2019-03-18 DIAGNOSIS — K219 Gastro-esophageal reflux disease without esophagitis: Secondary | ICD-10-CM | POA: Insufficient documentation

## 2019-03-18 DIAGNOSIS — Z7951 Long term (current) use of inhaled steroids: Secondary | ICD-10-CM | POA: Diagnosis not present

## 2019-03-18 DIAGNOSIS — N62 Hypertrophy of breast: Secondary | ICD-10-CM | POA: Diagnosis not present

## 2019-03-18 DIAGNOSIS — Z825 Family history of asthma and other chronic lower respiratory diseases: Secondary | ICD-10-CM | POA: Diagnosis not present

## 2019-03-18 DIAGNOSIS — Z800 Family history of malignant neoplasm of digestive organs: Secondary | ICD-10-CM | POA: Diagnosis not present

## 2019-03-18 DIAGNOSIS — Z888 Allergy status to other drugs, medicaments and biological substances status: Secondary | ICD-10-CM | POA: Insufficient documentation

## 2019-03-18 DIAGNOSIS — Z8261 Family history of arthritis: Secondary | ICD-10-CM | POA: Insufficient documentation

## 2019-03-18 DIAGNOSIS — R918 Other nonspecific abnormal finding of lung field: Secondary | ICD-10-CM | POA: Insufficient documentation

## 2019-03-18 DIAGNOSIS — N641 Fat necrosis of breast: Secondary | ICD-10-CM | POA: Diagnosis not present

## 2019-03-18 HISTORY — PX: BREAST CYST EXCISION: SHX579

## 2019-03-18 HISTORY — PX: LIPOSUCTION: SHX10

## 2019-03-18 SURGERY — EXCISION, CYST, BREAST
Anesthesia: General | Site: Breast | Laterality: Right

## 2019-03-18 MED ORDER — DIPHENHYDRAMINE HCL 50 MG/ML IJ SOLN
12.5000 mg | Freq: Once | INTRAMUSCULAR | Status: AC
  Administered 2019-03-18: 12.5 mg via INTRAVENOUS

## 2019-03-18 MED ORDER — MEPERIDINE HCL 25 MG/ML IJ SOLN: 6.2500 mg | INTRAMUSCULAR | Status: DC | PRN

## 2019-03-18 MED ORDER — FENTANYL CITRATE (PF) 100 MCG/2ML IJ SOLN
INTRAMUSCULAR | Status: DC | PRN
  Administered 2019-03-18 (×2): 50 ug via INTRAVENOUS

## 2019-03-18 MED ORDER — FENTANYL CITRATE (PF) 100 MCG/2ML IJ SOLN
25.0000 ug | INTRAMUSCULAR | Status: DC | PRN
  Administered 2019-03-18 (×2): 50 ug via INTRAVENOUS

## 2019-03-18 MED ORDER — DEXAMETHASONE SODIUM PHOSPHATE 10 MG/ML IJ SOLN
INTRAMUSCULAR | Status: AC
  Filled 2019-03-18: qty 1

## 2019-03-18 MED ORDER — KETOROLAC TROMETHAMINE 30 MG/ML IJ SOLN
INTRAMUSCULAR | Status: AC
  Filled 2019-03-18: qty 1

## 2019-03-18 MED ORDER — SUCCINYLCHOLINE CHLORIDE 200 MG/10ML IV SOSY
PREFILLED_SYRINGE | INTRAVENOUS | Status: AC
  Filled 2019-03-18: qty 10

## 2019-03-18 MED ORDER — LIDOCAINE-EPINEPHRINE 1 %-1:100000 IJ SOLN
INTRAMUSCULAR | Status: DC | PRN
  Administered 2019-03-18: 14 mL

## 2019-03-18 MED ORDER — SODIUM CHLORIDE 0.9% FLUSH: 3.0000 mL | Freq: Two times a day (BID) | INTRAVENOUS | Status: DC

## 2019-03-18 MED ORDER — CEFAZOLIN SODIUM-DEXTROSE 2-4 GM/100ML-% IV SOLN
2.0000 g | INTRAVENOUS | Status: AC
  Administered 2019-03-18: 09:00:00 2 g via INTRAVENOUS

## 2019-03-18 MED ORDER — LIDOCAINE HCL (CARDIAC) PF 100 MG/5ML IV SOSY
PREFILLED_SYRINGE | INTRAVENOUS | Status: DC | PRN
  Administered 2019-03-18: 50 mg via INTRAVENOUS

## 2019-03-18 MED ORDER — SCOPOLAMINE 1 MG/3DAYS TD PT72
MEDICATED_PATCH | TRANSDERMAL | Status: AC
  Filled 2019-03-18: qty 1

## 2019-03-18 MED ORDER — SCOPOLAMINE 1 MG/3DAYS TD PT72
MEDICATED_PATCH | TRANSDERMAL | Status: DC | PRN
  Administered 2019-03-18: 1 via TRANSDERMAL

## 2019-03-18 MED ORDER — ONDANSETRON HCL 4 MG/2ML IJ SOLN
INTRAMUSCULAR | Status: DC | PRN
  Administered 2019-03-18: 4 mg via INTRAVENOUS

## 2019-03-18 MED ORDER — PHENYLEPHRINE 40 MCG/ML (10ML) SYRINGE FOR IV PUSH (FOR BLOOD PRESSURE SUPPORT)
PREFILLED_SYRINGE | INTRAVENOUS | Status: AC
  Filled 2019-03-18: qty 10

## 2019-03-18 MED ORDER — EPHEDRINE 5 MG/ML INJ
INTRAVENOUS | Status: AC
  Filled 2019-03-18: qty 10

## 2019-03-18 MED ORDER — SODIUM CHLORIDE 0.9 % IV SOLN: 250.0000 mL | INTRAVENOUS | Status: DC | PRN

## 2019-03-18 MED ORDER — MIDAZOLAM HCL 2 MG/2ML IJ SOLN
INTRAMUSCULAR | Status: AC
  Filled 2019-03-18: qty 2

## 2019-03-18 MED ORDER — PROPOFOL 10 MG/ML IV BOLUS
INTRAVENOUS | Status: DC | PRN
  Administered 2019-03-18: 150 mg via INTRAVENOUS

## 2019-03-18 MED ORDER — FENTANYL CITRATE (PF) 100 MCG/2ML IJ SOLN: 25.0000 ug | INTRAMUSCULAR | Status: DC | PRN

## 2019-03-18 MED ORDER — FENTANYL CITRATE (PF) 100 MCG/2ML IJ SOLN
INTRAMUSCULAR | Status: AC
  Filled 2019-03-18: qty 2

## 2019-03-18 MED ORDER — CEFAZOLIN SODIUM-DEXTROSE 2-4 GM/100ML-% IV SOLN
INTRAVENOUS | Status: AC
  Filled 2019-03-18: qty 100

## 2019-03-18 MED ORDER — ACETAMINOPHEN 325 MG PO TABS: 650.0000 mg | ORAL_TABLET | ORAL | Status: DC | PRN

## 2019-03-18 MED ORDER — KETOROLAC TROMETHAMINE 30 MG/ML IJ SOLN
30.0000 mg | Freq: Once | INTRAMUSCULAR | Status: AC
  Administered 2019-03-18: 30 mg via INTRAVENOUS

## 2019-03-18 MED ORDER — SODIUM CHLORIDE 0.9 % IV SOLN
INTRAVENOUS | Status: AC
  Filled 2019-03-18: qty 500000

## 2019-03-18 MED ORDER — LACTATED RINGERS IV SOLN
INTRAVENOUS | Status: DC
  Administered 2019-03-18: 08:00:00 via INTRAVENOUS

## 2019-03-18 MED ORDER — BUPIVACAINE HCL (PF) 0.25 % IJ SOLN
INTRAMUSCULAR | Status: AC
  Filled 2019-03-18: qty 60

## 2019-03-18 MED ORDER — ONDANSETRON HCL 4 MG/2ML IJ SOLN
INTRAMUSCULAR | Status: AC
  Filled 2019-03-18: qty 2

## 2019-03-18 MED ORDER — MIDAZOLAM HCL 5 MG/5ML IJ SOLN
INTRAMUSCULAR | Status: DC | PRN
  Administered 2019-03-18: 2 mg via INTRAVENOUS

## 2019-03-18 MED ORDER — SODIUM CHLORIDE 0.9% FLUSH: 3.0000 mL | INTRAVENOUS | Status: DC | PRN

## 2019-03-18 MED ORDER — PROMETHAZINE HCL 25 MG/ML IJ SOLN: 6.2500 mg | INTRAMUSCULAR | Status: DC | PRN

## 2019-03-18 MED ORDER — DIPHENHYDRAMINE HCL 50 MG/ML IJ SOLN
INTRAMUSCULAR | Status: AC
  Filled 2019-03-18: qty 1

## 2019-03-18 MED ORDER — DEXAMETHASONE SODIUM PHOSPHATE 4 MG/ML IJ SOLN
INTRAMUSCULAR | Status: DC | PRN
  Administered 2019-03-18: 10 mg via INTRAVENOUS

## 2019-03-18 MED ORDER — EPHEDRINE SULFATE 50 MG/ML IJ SOLN
INTRAMUSCULAR | Status: DC | PRN
  Administered 2019-03-18: 10 mg via INTRAVENOUS

## 2019-03-18 MED ORDER — ACETAMINOPHEN 650 MG RE SUPP: 650.0000 mg | RECTAL | Status: DC | PRN

## 2019-03-18 MED ORDER — LIDOCAINE-EPINEPHRINE 1 %-1:100000 IJ SOLN
INTRAMUSCULAR | Status: AC
  Filled 2019-03-18: qty 3

## 2019-03-18 MED ORDER — BUPIVACAINE HCL (PF) 0.5 % IJ SOLN
INTRAMUSCULAR | Status: AC
  Filled 2019-03-18: qty 90

## 2019-03-18 SURGICAL SUPPLY — 66 items
ADH SKN CLS APL DERMABOND .7 (GAUZE/BANDAGES/DRESSINGS)
APL PRP STRL LF DISP 70% ISPRP (MISCELLANEOUS) ×2
BAG DECANTER FOR FLEXI CONT (MISCELLANEOUS) ×3 IMPLANT
BINDER BREAST LRG (GAUZE/BANDAGES/DRESSINGS) IMPLANT
BINDER BREAST MEDIUM (GAUZE/BANDAGES/DRESSINGS) IMPLANT
BINDER BREAST XLRG (GAUZE/BANDAGES/DRESSINGS) IMPLANT
BINDER BREAST XXLRG (GAUZE/BANDAGES/DRESSINGS) IMPLANT
BIOPATCH RED 1 DISK 7.0 (GAUZE/BANDAGES/DRESSINGS) IMPLANT
BLADE HEX COATED 2.75 (ELECTRODE) ×3 IMPLANT
BLADE SURG 15 STRL LF DISP TIS (BLADE) ×2 IMPLANT
BLADE SURG 15 STRL SS (BLADE) ×3
BNDG GAUZE ELAST 4 BULKY (GAUZE/BANDAGES/DRESSINGS) ×6 IMPLANT
CANISTER SUCT 1200ML W/VALVE (MISCELLANEOUS) ×3 IMPLANT
CHLORAPREP W/TINT 26 (MISCELLANEOUS) ×3 IMPLANT
COVER BACK TABLE REUSABLE LG (DRAPES) ×3 IMPLANT
COVER MAYO STAND REUSABLE (DRAPES) ×3 IMPLANT
COVER WAND RF STERILE (DRAPES) IMPLANT
DECANTER SPIKE VIAL GLASS SM (MISCELLANEOUS) IMPLANT
DERMABOND ADVANCED (GAUZE/BANDAGES/DRESSINGS)
DERMABOND ADVANCED .7 DNX12 (GAUZE/BANDAGES/DRESSINGS) IMPLANT
DRAIN CHANNEL 19F RND (DRAIN) IMPLANT
DRAPE LAPAROSCOPIC ABDOMINAL (DRAPES) ×3 IMPLANT
DRSG PAD ABDOMINAL 8X10 ST (GAUZE/BANDAGES/DRESSINGS) ×3 IMPLANT
ELECT BLADE 4.0 EZ CLEAN MEGAD (MISCELLANEOUS)
ELECT BLADE 6.5 EXT (BLADE) IMPLANT
ELECT REM PT RETURN 9FT ADLT (ELECTROSURGICAL) ×3
ELECTRODE BLDE 4.0 EZ CLN MEGD (MISCELLANEOUS) IMPLANT
ELECTRODE REM PT RTRN 9FT ADLT (ELECTROSURGICAL) ×2 IMPLANT
EVACUATOR SILICONE 100CC (DRAIN) IMPLANT
GLOVE BIO SURGEON STRL SZ 6.5 (GLOVE) ×3 IMPLANT
GOWN STRL REUS W/ TWL LRG LVL3 (GOWN DISPOSABLE) ×4 IMPLANT
GOWN STRL REUS W/TWL LRG LVL3 (GOWN DISPOSABLE) ×6
IV NS 1000ML (IV SOLUTION)
IV NS 1000ML BAXH (IV SOLUTION) IMPLANT
IV NS 500ML (IV SOLUTION)
IV NS 500ML BAXH (IV SOLUTION) IMPLANT
NDL HYPO 25X1 1.5 SAFETY (NEEDLE) IMPLANT
NDL SAFETY ECLIPSE 18X1.5 (NEEDLE) IMPLANT
NEEDLE HYPO 18GX1.5 SHARP (NEEDLE)
NEEDLE HYPO 25X1 1.5 SAFETY (NEEDLE) IMPLANT
NS IRRIG 1000ML POUR BTL (IV SOLUTION) IMPLANT
PACK BASIN DAY SURGERY FS (CUSTOM PROCEDURE TRAY) ×3 IMPLANT
PENCIL SMOKE EVACUATOR (MISCELLANEOUS) ×3 IMPLANT
PIN SAFETY STERILE (MISCELLANEOUS) IMPLANT
SLEEVE SCD COMPRESS KNEE MED (MISCELLANEOUS) ×3 IMPLANT
SPONGE LAP 18X18 RF (DISPOSABLE) ×6 IMPLANT
STRIP SUTURE WOUND CLOSURE 1/2 (SUTURE) IMPLANT
SUT MNCRL AB 4-0 PS2 18 (SUTURE) IMPLANT
SUT MON AB 3-0 SH 27 (SUTURE) ×3
SUT MON AB 3-0 SH27 (SUTURE) ×2 IMPLANT
SUT MON AB 5-0 PS2 18 (SUTURE) ×3 IMPLANT
SUT PDS AB 2-0 CT2 27 (SUTURE) IMPLANT
SUT PROLENE 3 0 PS 2 (SUTURE) IMPLANT
SUT SILK 3 0 PS 1 (SUTURE) IMPLANT
SUT VIC AB 3-0 SH 27 (SUTURE)
SUT VIC AB 3-0 SH 27X BRD (SUTURE) IMPLANT
SUT VICRYL 4-0 PS2 18IN ABS (SUTURE) IMPLANT
SWAB COLLECTION DEVICE MRSA (MISCELLANEOUS) IMPLANT
SWAB CULTURE ESWAB REG 1ML (MISCELLANEOUS) IMPLANT
SYR 50ML LL SCALE MARK (SYRINGE) IMPLANT
SYR BULB IRRIGATION 50ML (SYRINGE) ×3 IMPLANT
SYR CONTROL 10ML LL (SYRINGE) IMPLANT
TOWEL GREEN STERILE FF (TOWEL DISPOSABLE) ×6 IMPLANT
TUBE CONNECTING 20X1/4 (TUBING) ×3 IMPLANT
UNDERPAD 30X36 HEAVY ABSORB (UNDERPADS AND DIAPERS) ×6 IMPLANT
YANKAUER SUCT BULB TIP NO VENT (SUCTIONS) ×3 IMPLANT

## 2019-03-18 NOTE — Anesthesia Preprocedure Evaluation (Signed)
Anesthesia Evaluation  Patient identified by MRN, date of birth, ID band Patient awake    Reviewed: Allergy & Precautions, H&P , NPO status , Patient's Chart, lab work & pertinent test results  History of Anesthesia Complications (+) PONV and history of anesthetic complications  Airway Mallampati: II  TM Distance: >3 FB Neck ROM: full    Dental no notable dental hx.    Pulmonary asthma ,    Pulmonary exam normal breath sounds clear to auscultation       Cardiovascular hypertension, Normal cardiovascular exam Rhythm:regular Rate:Normal     Neuro/Psych  Headaches, PSYCHIATRIC DISORDERS Anxiety Depression  Neuromuscular disease    GI/Hepatic GERD  ,  Endo/Other    Renal/GU      Musculoskeletal  (+) Arthritis , Fibromyalgia -  Abdominal   Peds  Hematology   Anesthesia Other Findings   Reproductive/Obstetrics                             Anesthesia Physical  Anesthesia Plan  ASA: II  Anesthesia Plan: General   Post-op Pain Management:    Induction: Intravenous  PONV Risk Score and Plan: 4 or greater and Ondansetron, Dexamethasone, Midazolam, Scopolamine patch - Pre-op and Treatment may vary due to age or medical condition  Airway Management Planned: LMA  Additional Equipment:   Intra-op Plan:   Post-operative Plan: Extubation in OR  Informed Consent: I have reviewed the patients History and Physical, chart, labs and discussed the procedure including the risks, benefits and alternatives for the proposed anesthesia with the patient or authorized representative who has indicated his/her understanding and acceptance.       Plan Discussed with: CRNA, Anesthesiologist and Surgeon  Anesthesia Plan Comments:         Anesthesia Quick Evaluation

## 2019-03-18 NOTE — Anesthesia Postprocedure Evaluation (Signed)
Anesthesia Post Note  Patient: TAJAI DALESSANDRO  Procedure(s) Performed: excision necrotic breast tissue left  (Bilateral Breast) excision right necrotic breast tissue with liposuction (Right Breast)     Patient location during evaluation: PACU Anesthesia Type: General Level of consciousness: awake and alert Pain management: pain level controlled Vital Signs Assessment: post-procedure vital signs reviewed and stable Respiratory status: spontaneous breathing, nonlabored ventilation and respiratory function stable Cardiovascular status: blood pressure returned to baseline and stable Postop Assessment: no apparent nausea or vomiting Anesthetic complications: no    Last Vitals:  Vitals:   03/18/19 1100 03/18/19 1115  BP: 131/76 128/77  Pulse: 70 65  Resp: 11 16  Temp:    SpO2: 98% 95%    Last Pain:  Vitals:   03/18/19 1100  TempSrc:   PainSc: 2                  Lynda Rainwater

## 2019-03-18 NOTE — H&P (Signed)
Brenda [name] is an 68 y.o. female.   Chief Complaint: breast fat necrosis HPI: The patient is a 68 yrs old female here for treatment for her breasts.  She underwent bilateral repeat breast reduction in June.  Here skin healing has been good.  She has been concerned about pain and firm areas.  She has developed fat necrosis on both sides.  Surgery was scheduled in October and postponed to today.  She has a history of asthma, hypertension and a CVA.  She responds well to scopolamine patch for PON. She has stated that she is OK with an A cup size in order to remove the needed tissue.   Past Medical History:  Diagnosis Date  . Anxiety   . Arthritis   . Bladder pain   . Cervical disc disease    THINKS C 4 TO C 5  . Chronic cough    followed by dr wert and ENT dr Carol Ada  . Chronic seasonal allergic rhinitis   . Complication of anesthesia PAIN ATTACKS   LACTATED RINGERS- CONTRAINDICATED  . Depression   . 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 Fibromyalgia   . Frequency of urination   . GERD (gastroesophageal reflux disease)   . Heart murmur    per pt pcp only hear's murmur occasionally, asymptomatic  . History of pancreatitis 1988-1989  . History of panic attacks   . Hyperlipidemia   . Hypertension LABILE   followed by pcp  . IBS (irritable bowel syndrome)   . Interstitial cystitis    urologist-- dr Jeffie Pollock  . Lumbar spondylosis   . Migraines   . Moderate persistent asthma    followed by pcp and dr wert   . Multiple pulmonary nodules    pulmonologist-- dr wert  . Normal cardiac stress test JULY 2009---  NORMAL  . PONV (postoperative nausea and vomiting)   . 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 Spasm of sphincter of Oddi 1987   02-13-2019  per pt no issues since she watches what medication's she takes  . Urethral stenosis S/P DILATIONS  . Urgency of  urination     Past Surgical History:  Procedure Laterality Date  . ABDOMINAL HYSTERECTOMY  1988   COMPLETE  . APPENDECTOMY  04/ 1986  . bilateral reduction mastopexy  1988  . bilateral turbinate resection  1990   nasal spreading graft  . BREAST REDUCTION SURGERY  10/17/2018   Procedure: bilateral breast reduction;  Surgeon: Wallace Going, DO;  Location: [place];  Service: Plastics;;  please adjust time to reflect 4 hours  . CATARACT EXTRACTION W/ INTRAOCULAR LENS  IMPLANT, BILATERAL  08 and 09/ 2017  . CHOLECYSTECTOMY  04/ 1986  . COLONOSCOPY  02/2004, 11/2010, LAST 03-2016 ENDO DONE ALSO   2005: Normal - Dr. Sammuel Cooper 2012: Normal  . CYSTO WITH HYDRODISTENSION N/A 12/17/2013   Procedure: CYSTO HYDRODISTENSION WITH INSTALLATION OF CHLOROPACTIN AND TETRACAINE;  Surgeon: Malka So, MD;  Location: [facility];  Service: Urology;  Laterality: N/A;  . CYSTO WITH HYDRODISTENSION N/A 06/02/2015   Procedure: CYSTOSCOPY/HYDRODISTENSION WITH INSTILLATION OF CLORPACTIN ;  Surgeon: [name], MD;  Location: [facility];  Service: Urology;  Laterality: N/A;  . CYSTO WITH HYDRODISTENSION N/A 11/08/2016   Procedure: CYSTOSCOPY/HYDRODISTENSION INSTILLATION OF CLORPACTIN;  Surgeon: [name], MD;  Location: [facility];  Service: Urology;  Laterality: N/A;  . CYSTO WITH HYDRODISTENSION N/A 03/06/2018   Procedure: CYSTOSCOPY/HYDRODISTENSION INSTILL CHLORPACTION;  Surgeon: [name], MD;  Location: [facility];  Service: Urology;  Laterality: N/A;  . CYSTOSCOPY WITH URETHRAL DILATATION  07/12/2011   Procedure: CYSTOSCOPY WITH URETHRAL DILATATION;  Surgeon: Malka So, MD;  Location: [facility];  Service: Urology;  Laterality: N/A;  HOD AND INSTILLATION OF CHLOROPACTIN C-ARM   . CYSTOSCOPY WITH URETHRAL DILATATION N/A 06/26/2012   Procedure: CYSTOSCOPY WITH URETHRAL DILATATION HYDRODISTENSION AND  INSTILLATION OF CLORPACTION ;  Surgeon: Malka So, MD;  Location: [facility];  Service: Urology;  Laterality: N/A;  CYSTOSCOPY WITH URETHRAL DILATATION HYDRODISTENSION AND INSTILLATION OF CLORPACTION   . CYSTOSTOMY W/ BLADDER BIOPSY  1983  . DG BARIUM SWALLOW ([place] HX)  03/2016  . DILATION AND CURETTAGE OF UTERUS  1987  . ERCP  1988   for pancreatitis with stents   S/P SEVERE PANCREATITIS  . HEMORRHOID SURGERY    . LASIK Bilateral APRIL 2005  . LIPOSUCTION Bilateral 10/17/2018   Procedure: LIPOSUCTION;  Surgeon: Wallace Going, DO;  Location: [place];  Service: Plastics;  Laterality: Bilateral;  . MULTIPLE CYSTO/ HOD/ URETHRAL DILATION/ INSTILLATION CLORPACTIN  .LAST ONE 03-31-2010  . NASAL SINUS SURGERY  1977   sinus cyst  . REDUCTION MAMMAPLASTY Bilateral 1988  . revision rhinoplasty  1978  . RHINOPLASTY  1977  . RIGHT SALPINGOOPHECTOMY  1998  . TONSILLECTOMY AND ADENOIDECTOMY  1963  . VIDEO BRONCHOSCOPY Bilateral 08/23/2016   Procedure: VIDEO BRONCHOSCOPY WITHOUT FLUORO;  Surgeon: Javier Glazier, MD;  Location: [facility];  Service: Cardiopulmonary;  Laterality: Bilateral;    Family History  Problem Relation Age of Onset  . Colon cancer Maternal Grandmother   . Colon cancer Cousin   . Allergies Mother   . Asthma Mother   . Heart disease Mother   . Osteoarthritis Mother   . Diabetes Mother   . Irritable bowel syndrome Mother   . Brain cancer Father   . Osteoarthritis Father   . Colon polyps Father        adenomatous  . Heart disease Father   . Melanoma Sister   . Osteoarthritis Sister   . Irritable bowel syndrome Sister   . Diabetes Sister   . Heart disease Sister   . Stroke Sister   . Ulcerative colitis Maternal Uncle   . Stomach cancer Neg Hx   . Rheumatologic disease Neg Hx    Social History:  reports that she has never smoked. She has never used smokeless tobacco. She reports that she does not drink alcohol or use  drugs.  Allergies:  Allergies  Allergen Reactions  . Codeine Other (See Comments)    SEVERE ELEVATED LIVER ENZYMES  . Latex Rash  . Stadol [Butorphanol Tartrate] Swelling    Lips numb, tongue swell, tachycardia  . Adhesive [Tape] Other (See Comments)    TEARS SKIN; PLEASE USE COBAN WRAP!!  . Bupivacaine Other (See Comments)    Other Reaction: PRE-SYNCOPE, TACTYCARDIA TACHYCARDIA/ PRESYNCOPE  . Butorphanol Tartrate Other (See Comments)    NUMBNESS, TACHYCARDIA  . Clarithromycin Nausea Only and Other (See Comments)    TACHYCARDIA  . Cymbalta [Duloxetine Hcl] Other (See Comments)    Blurred vision  . Dilaudid [Hydromorphone Hcl] Itching    SEVERE ITCHING  . Elavil [Amitriptyline Hcl] Other (See Comments)  Elevated liver enzymes  . Hydrocodone Other (See Comments)    Elevated liver enzymes  . Hyoscyamine Sulfate Other (See Comments)    Dizziness, nausea, headache  . Levbid [Hyoscyamine Sulfate] Other (See Comments)    Dizziness, nausea, headache  . Lisinopril Cough    Dry cough  . Marcaine [Bupivacaine Hcl] Other (See Comments)    TACHYCARDIA, PRESYNCOPE  . Pentazocine Lactate Other (See Comments)    Tachycardia   . Percodan [Oxycodone-Aspirin] Nausea Only  . Trazodone And Nefazodone Other (See Comments)    Tachycardia   . Vistaril [Hydroxyzine Hcl] Other (See Comments)    Altered sensorium  . Morphine And Related Itching and Rash    Elevate liver enzymes    Medications Prior to Admission  Medication Sig Dispense Refill  . acetaminophen (TYLENOL) 325 MG tablet Take 500 mg by mouth every 6 (six) hours as needed (for headaches).     Marland Kitchen albuterol (VENTOLIN HFA) 108 (90 Base) MCG/ACT inhaler Inhale into the lungs every 6 (six) hours as needed for wheezing or shortness of breath.    . ASHWAGANDHA PO Take 1 capsule by mouth daily.    Marland Kitchen ezetimibe (ZETIA) 10 MG tablet Take 10 mg by mouth every evening.     Marland Kitchen FLUoxetine (PROZAC) 10 MG tablet TAKE 1 TABLET ONCE DAILY. 90  tablet 2  . fluticasone (FLONASE) 50 MCG/ACT nasal spray Place 2 sprays into both nostrils daily.    . folic acid (FOLVITE) 1 MG tablet Take 1 mg by mouth daily.    Marland Kitchen LORazepam (ATIVAN) 1 MG tablet Take 1 tablet (1 mg total) by mouth every 8 (eight) hours as needed for anxiety. 90 tablet 1  . losartan (COZAAR) 50 MG tablet Take 50 mg by mouth daily.    Marland Kitchen MAGNESIUM PO Take 1 tablet by mouth as needed.     . metoprolol (LOPRESSOR) 50 MG tablet Take 25 mg by mouth 2 (two) times daily.     . Multiple Vitamins-Minerals (HAIR SKIN AND NAILS FORMULA PO) Take by mouth as needed.    . pantoprazole (PROTONIX) 40 MG tablet 40 mg.     . Probiotic Product (PROBIOTIC PO) Take 1 capsule by mouth every evening.     Marland Kitchen REPATHA SURECLICK [id number] MG/ML SOAJ Inject SQ into the abdomen every 2 weeks    . UNABLE TO FIND CALCUIM WITH VITAMIN D GUMMIES BID    . vitamin B-12 (CYANOCOBALAMIN) 1000 MCG tablet Take 1,000 mcg by mouth daily.    . Vitamin D, Ergocalciferol, (DRISDOL) 50000 UNITS CAPS Take 50,000 Units by mouth every 7 (seven) days.      Marland Kitchen zolpidem (AMBIEN) 5 MG tablet Take 1 tablet (5 mg total) by mouth at bedtime as needed for sleep. 30 tablet 5  . famotidine (PEPCID) 20 MG tablet Take 20 mg by mouth at bedtime.      No results found for this or any previous visit (from the past 48 hour(s)). No results found.  Review of Systems  Constitutional: Negative.   HENT: Negative.   Eyes: Negative.   Cardiovascular: Negative.   Gastrointestinal: Negative.   Genitourinary: Negative.   Musculoskeletal: Negative.   Skin: Negative.   Neurological: Negative.   Psychiatric/Behavioral: Negative.     Blood pressure 135/88, pulse (!) 57, temperature (!) 97.4 F (36.3 C), temperature source Oral, resp. rate 16, height 5' (1.524 m), weight 66.4 kg, SpO2 100 %. Physical Exam  Constitutional: She is oriented to person, place, and time. She appears well-developed  and well-nourished.  HENT:  Head: Normocephalic and  atraumatic.  Eyes: Pupils are equal, round, and reactive to light. EOM are normal.  Cardiovascular: Normal rate.  Respiratory: Effort normal. No respiratory distress.  GI: Soft. She exhibits no distension.  Musculoskeletal:        General: No tenderness or edema.  Neurological: She is alert and oriented to person, place, and time.  Skin: Skin is warm.  Psychiatric: She has a normal mood and affect. Her behavior is normal. Judgment and thought content normal.     Assessment/Plan Fat necrosis of breast tissue.  Plan for excision of bilateral fat necrosis of breast.  The risk that can be encountered with breast surgery were discussed and include the following but not limited to these:  Breast asymmetry, fluid accumulation, firmness of the breast, inability to breast feed, loss of nipple or areola, skin loss, decrease or no nipple sensation, fat necrosis of the breast tissue, bleeding, infection, healing delay.  There are risks of anesthesia, changes to skin sensation and injury to nerves or blood vessels.  The muscle can be temporarily or permanently injured.  You may have an allergic reaction to tape, suture, glue, blood products which can result in skin discoloration, swelling, pain, skin lesions, poor healing.  Any of these can lead to the need for revisonal surgery or stage procedures.  A reduction has potential to interfere with diagnostic procedures.  Nipple or breast piercing can increase risks of infection.  This procedure is best done when the breast is fully developed.  Changes in the breast will continue to occur over time.  Pregnancy can alter the outcomes of previous breast reduction surgery, weight gain and weigh loss can also effect the long term appearance.     [place], DO 03/18/2019, 7:45 AM

## 2019-03-18 NOTE — Transfer of Care (Signed)
Immediate Anesthesia Transfer of Care Note  Patient: Brenda [name]  Procedure(s) Performed: excision necrotic breast tissue left  (Bilateral Breast) excision right necrotic breast tissue with liposuction (Right Breast)  Patient Location: PACU  Anesthesia Type:General  Level of Consciousness: sedated  Airway & Oxygen Therapy: Patient Spontanous Breathing and Patient connected to face mask oxygen  Post-op Assessment: Report given to RN and Post -op Vital signs reviewed and stable  Post vital signs: Reviewed and stable  Last Vitals:  Vitals Value Taken Time  BP    Temp    Pulse 69 03/18/19 0940  Resp 20 03/18/19 0940  SpO2 100 % 03/18/19 0940  Vitals shown include unvalidated device data.  Last Pain:  Vitals:   03/18/19 0738  TempSrc: Oral  PainSc: 0-No pain         Complications: No apparent anesthesia complications

## 2019-03-18 NOTE — Op Note (Signed)
DATE OF OPERATION: 03/18/2019  LOCATION: [place]  PREOPERATIVE DIAGNOSIS: Bilateral breast fat necrosis  POSTOPERATIVE DIAGNOSIS: Same  PROCEDURE:  1. Left breast - excision of fat necrosis 2 x 2 cm 2. Right breast - excision of fat necrosis 2 x 4 cm  SURGEON: Nikeria Kalman [place] Tequila Rottmann, DO  ASSISTANT: Roetta Sessions, PA  EBL: 5 cc  CONDITION: Stable  COMPLICATIONS: None  INDICATION: The patient, Brenda [name], is a 68 y.o. female born on 07-Feb-1951, is here for treatment of bilateral fat necrosis.   PROCEDURE DETAILS:  The patient was seen prior to surgery and marked.  The IV antibiotics were given. The patient was taken to the operating room and given a general anesthetic. A standard time out was performed and all information was confirmed by those in the room. SCDs were placed.   The patient was prepped and draped with betadine.  Local with epinepherine was injected around the Nipple areola complex.    Left breast: The #15 blade was used to make a 5 mm incision at the medial inframammary fold.  The pickled knife edge was inserted and used to cut the fat necrosis of the 2 x 2 cm area at the 9 o'clock position of the NAC.  The suction was used to excise the specimen.  The incision was closed with the 5-0 Monocryl.     Right breast: the #15 blade was used to make a 5 mm incision at the lateral inframammary fold.  The pickled knife edge was inserted and used to cut the fat necrosis of the 2 x 4 cm area around the NAC laterally and superiorly.  There was marked improvement in the firmness and shape.  The fat was removed with the aide of suction.  This was sent to pathology.  The incision was closed with the 5-0 Monocryl.  The patient was placed in a breast binder. The patient was allowed to wake up and taken to recovery room in stable condition at the end of the case. The family was notified at the end of the case.   The advanced practice practitioner (APP) assisted  throughout the case.  The APP was essential in retraction and counter traction when needed to make the case progress smoothly.  This retraction and assistance made it possible to see the tissue plans for the procedure.  The assistance was needed for blood control, tissue re-approximation and assisted with closure of the incision site.

## 2019-03-18 NOTE — Discharge Instructions (Signed)
INSTRUCTIONS FOR AFTER SURGERY   You will likely have some questions about what to expect following your operation.  The following information will help you and your family understand what to expect when you are discharged from the hospital.  Following these guidelines will help ensure a smooth recovery and reduce risks of complications.  Postoperative instructions include information on: diet, wound care, medications and physical activity.  AFTER SURGERY Expect to go home after the procedure.  In some cases, you may need to spend one night in the hospital for observation.  DIET This surgery does not require a specific diet.  However, I have to mention that the healthier you eat the better your body can start healing. It is important to increasing your protein intake.  This means limiting the foods with added sugar.  Focus on fruits and vegetables and some meat.  If you have any liposuction during your procedure be sure to drink water.  If your urine is bright yellow, then it is concentrated, and you need to drink more water.  As a general rule after surgery, you should have 8 ounces of water every hour while awake.  If you find you are persistently nauseated or unable to take in liquids let us know.  NO TOBACCO USE or EXPOSURE.  This will slow your healing process and increase the risk of a wound.  WOUND CARE If you don't have a drain: You can shower the day after surgery.  Use fragrance free soap.  Dial, Dove, Ivory and Cetaphil are usually mild on the skin.  If you have a drain: Clean with baby wipes until the drain is removed.   If you have steri-strips / tape directly attached to your skin leave them in place. It is OK to get these wet.  No baths, pools or hot tubs for two weeks. We close your incision to leave the smallest and best-looking scar. No ointment or creams on your incisions until given the go ahead.  Especially not Neosporin (Too many skin reactions with this one).  A few weeks after  surgery you can use Mederma and start massaging the scar. We ask you to wear your binder or sports bra for the first 6 weeks around the clock, including while sleeping. This provides added comfort and helps reduce the fluid accumulation at the surgery site.  ACTIVITY No heavy lifting until cleared by the doctor.  It is OK to walk and climb stairs. In fact, moving your legs is very important to decrease your risk of a blood clot.  It will also help keep you from getting deconditioned.  Every 1 to 2 hours get up and walk for 5 minutes. This will help with a quicker recovery back to normal.  Let pain be your guide so you don't do too much.  NO, you cannot do the spring cleaning and don't plan on taking care of anyone else.  This is your time for TLC.   WORK Everyone returns to work at different times. As a rough guide, most people take at least 1 - 2 weeks off prior to returning to work. If you need documentation for your job, bring the forms to your postoperative follow up visit.  DRIVING Arrange for someone to bring you home from the hospital.  You may be able to drive a few days after surgery but not while taking any narcotics or valium.  BOWEL MOVEMENTS Constipation can occur after anesthesia and while taking pain medication.  It is important   to stay ahead for your comfort.  We recommend taking Milk of Magnesia (2 tablespoons; twice a day) while taking the pain pills.  SEROMA This is fluid your body tried to put in the surgical site.  This is normal but if it creates excessive pain and swelling let us know.  It usually decreases in a few weeks.  WHEN TO CALL Call your surgeon's office if any of the following occur:  Fever 101 degrees F or greater  Excessive bleeding or fluid from the incision site.  Pain that increases over time without aid from the medications  Redness, warmth, or pus draining from incision sites  Persistent nausea or inability to take in liquids  Severe misshapen  area that underwent the operation.    No Toradol until 7:30 PM on 03/18/2019.     Post Anesthesia Home Care Instructions  Activity: Get plenty of rest for the remainder of the day. A responsible individual must stay with you for 24 hours following the procedure.  For the next 24 hours, DO NOT: -Drive a car -Paediatric nurse -Drink alcoholic beverages -Take any medication unless instructed by your physician -Make any legal decisions or sign important papers.  Meals: Start with liquid foods such as gelatin or soup. Progress to regular foods as tolerated. Avoid greasy, spicy, heavy foods. If nausea and/or vomiting occur, drink only clear liquids until the nausea and/or vomiting subsides. Call your physician if vomiting continues.  Special Instructions/Symptoms: Your throat may feel dry or sore from the anesthesia or the breathing tube placed in your throat during surgery. If this causes discomfort, gargle with warm salt water. The discomfort should disappear within 24 hours.  If you had a scopolamine patch placed behind your ear for the management of post- operative nausea and/or vomiting:  1. The medication in the patch is effective for 72 hours, after which it should be removed.  Wrap patch in a tissue and discard in the trash. Wash hands thoroughly with soap and water. 2. You may remove the patch earlier than 72 hours if you experience unpleasant side effects which may include dry mouth, dizziness or visual disturbances. 3. Avoid touching the patch. Wash your hands with soap and water after contact with the patch.

## 2019-03-18 NOTE — Anesthesia Procedure Notes (Signed)
Procedure Name: LMA Insertion Date/Time: 03/18/2019 8:40 AM Performed by: Willa Frater, CRNA Pre-anesthesia Checklist: Patient identified, Emergency Drugs available, Suction available and Patient being monitored Patient Re-evaluated:Patient Re-evaluated prior to induction Oxygen Delivery Method: Circle system utilized Preoxygenation: Pre-oxygenation with 100% oxygen Induction Type: IV induction Ventilation: Mask ventilation without difficulty LMA: LMA inserted LMA Size: 4.0 Number of attempts: 1 Airway Equipment and Method: Bite block Placement Confirmation: positive ETCO2 Tube secured with: Tape Dental Injury: Teeth and Oropharynx as per pre-operative assessment

## 2019-03-19 ENCOUNTER — Encounter (HOSPITAL_BASED_OUTPATIENT_CLINIC_OR_DEPARTMENT_OTHER): Payer: Self-pay | Admitting: Plastic Surgery

## 2019-03-20 ENCOUNTER — Other Ambulatory Visit: Payer: Self-pay | Admitting: Urology

## 2019-03-20 LAB — SURGICAL PATHOLOGY

## 2019-03-24 ENCOUNTER — Other Ambulatory Visit: Payer: Self-pay

## 2019-03-24 ENCOUNTER — Encounter (HOSPITAL_BASED_OUTPATIENT_CLINIC_OR_DEPARTMENT_OTHER): Payer: Self-pay | Admitting: *Deleted

## 2019-03-24 NOTE — Progress Notes (Signed)
Spoke w/ via phone for pre-op interview--- PT Lab needs dos----  Istat 8             Lab results------  Current ekg in chart/ epic COVID test ------  03-30-2019 @ 1035 Arrive at -------  0815 NPO after ------ Mn Medications to take morning of surgery ----- Protonix, Metoprolol, Ativan w/ sips of water Diabetic medication ----- n/a Patient Special Instructions ----- n/a Pre-Op special Istructions ----- n/a Patient verbalized understanding of instructions that were given at this phone interview. Patient denies shortness of breath, chest pain, fever, cough a this phone interview.

## 2019-03-30 ENCOUNTER — Other Ambulatory Visit (HOSPITAL_COMMUNITY)
Admission: RE | Admit: 2019-03-30 | Discharge: 2019-03-30 | Disposition: A | Discharge status: Home / Self Care | Payer: PPO | Source: Ambulatory Visit | Attending: Urology | Admitting: Urology

## 2019-03-30 DIAGNOSIS — Z20828 Contact with and (suspected) exposure to other viral communicable diseases: Secondary | ICD-10-CM | POA: Insufficient documentation

## 2019-03-30 DIAGNOSIS — Z01812 Encounter for preprocedural laboratory examination: Secondary | ICD-10-CM | POA: Insufficient documentation

## 2019-03-31 ENCOUNTER — Ambulatory Visit (INDEPENDENT_AMBULATORY_CARE_PROVIDER_SITE_OTHER): Payer: PPO | Admitting: Plastic Surgery

## 2019-03-31 ENCOUNTER — Encounter: Payer: Self-pay | Admitting: Plastic Surgery

## 2019-03-31 ENCOUNTER — Other Ambulatory Visit: Payer: Self-pay

## 2019-03-31 VITALS — BP 148/92 | HR 59 | Temp 98.2°F

## 2019-03-31 DIAGNOSIS — N62 Hypertrophy of breast: Secondary | ICD-10-CM

## 2019-03-31 LAB — NOVEL CORONAVIRUS, NAA (HOSP ORDER, SEND-OUT TO REF LAB; TAT 18-24 HRS): SARS-CoV-2, NAA: NOT DETECTED

## 2019-03-31 NOTE — Progress Notes (Signed)
The patient is a 68yrs old female here for follow-up on her bilateral breast surgery.  She had excision of fat necrosis.  The left breast is markedly improved and soft.  The right breast is much better. There is still some firmness in the breast mound as expected.  She has minimal tenderness and no sign of infection.  Bruising as expected and improving.  Follow up as needed.

## 2019-04-01 NOTE — H&P (Signed)
CC/HPI: I have interstitial cystitis.     Brenda [name] has a long history of IC that requires intermittent cystoscopy with HOD and Clorpactin. She gets regular office instillations but is in need of an HOD.     ALLERGIES: Codeine Derivatives - elevated liver enzymes Dilaudid TABS - Itching Lisinopril TABS - cough Marcaine SOLN - tachycardia and presyncope Morphine Derivatives - Itching, Hives, Spasm of sphincter of ODIE and pancreatitis Stadol SOLN - Swelling, tongue swelling and lips numb    MEDICATIONS: Metoprolol Tartrate 50 mg tablet 1 tablet PO Daily  Calcium Plus Vitamin D 1 PO Daily  Folic Acid 1 mg tablet 1 tablet PO Daily  Lorazepam 1 mg tablet 1 tablet PO Daily  Losartan Potassium  Probiotic 1 PO Daily  Prozac 10 mg capsule  Repatha Sureclick [id number] mg/ml pen injector  Ventolin Hfa 1 PO Daily  Vitamin C 1 PO Daily  Vitamin D2 1,250 mcg (50,000 unit) capsule 1 capsule PO Daily  Zetia 10 mg tablet 1 tablet PO Daily     GU PSH: Cysto Bladder Ureth Biopsy - 2012 Cysto Dilate Stricture (M or F) - 2013 Cystoscopy Hydrodistention - 03/06/2018, 2018, 2013, 2011, 2010, 2010, 2008 D&C Non-OB - 1987 Hysterectomy Unilat SO - 1987 Ovary Removal Partial or Total - 2012 Subseq Female Urethral Dilation - 2019       PSH Notes: Bladder Irrigation- multiple(s)  Corneal LASIK Bilateral, bilateral breast reduction with mastopexy 1987 Dr. Dessie Coma     NON-GU PSH: Appendectomy - 2012 Breast Reduction, Bilateral - 10/14/2018, 1987 Cataract surgery, Bilateral Cholecystectomy (open) - 2012 Colonoscopy Diagnostic Colonoscopy - 2012 Endo Cholangiopancreatograph - 2012 Hemorrhoidectomy (favorite) - 2015 Needle Biopsy Liver - 2012 Neuroeltrd Stim Post Tibial - 2019, 2019, 2019, 2019, 2019, 2019, 2019, 2019, 2019, 2019, 2019, 2019 Nose Surgery (Unspecified) Remove Tonsils And Adenoids - 2012 Repair Septal Defect Suspend Breast - 1987     GU PMH: Splitting of urinary stream (Worsening,  Chronic), Repeat dilatation PRN - 2019 Nocturia - 2019 Pelvic/perineal pain - 2019 Interstitial Cystitis (w/o hematuria) - 2019, I will get her set up for a repeat HOD and clorpactin. She is well aware of the risks. 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 that year   NON-GU PMH: Cervicalgia, Neck pain, bilateral - 2017 Radiculopathy, cervical region, Cervical radiculopathy - 2017 Muscle weakness (generalized), Muscle weakness - 2017 Other muscle spasm, Muscle spasm - 2017 Paresthesia of skin, Paresthesia and pain of both upper extremities - 0000000 Follicular disorder, unspecified, Folliculitis - [id number] Abrasion of unspecified hand, initial encounter, Abrasion of hand - 2015 Acute upper respiratory infection, unspecified, Upper respiratory infection with cough and congestion - 2015 Personal history of other diseases of the respiratory system, History of acute bronchitis - 2015 Vitamin D deficiency, unspecified, Vitamin D deficiency - 2015 Encounter for general adult medical examination without abnormal findings, Encounter for preventive health examination - 2015 Anxiety, Anxiety (Symptom) - 2014 Asthma, Asthma - 2014 Cerebral infarction, unspecified, Stroke Syndrome - 2014 Fibromyalgia, Fibromyalgia - 2014 Irritable bowel syndrome with diarrhea, Irritable Bowel Syndrome - 2014 Personal history of other diseases of the circulatory system, History of hypertension -  2014 Personal history of other diseases of the nervous system and sense organs, History of migraine headaches - 2014 Personal history of other endocrine,  nutritional and metabolic disease, History of hypercholesterolemia - 2014 Personal history of other specified conditions, History of fibrocystic disease of breast - 2014 Rash and other nonspecific skin eruption, Rash - 2014 Abnormal results of liver function studies    FAMILY HISTORY: Acute Myocardial Infarction - Mother Bladder Cancer - Father colonic polyps - Father Congestive Heart Failure - Mother Coronary Artery Disease - Mother Death In The Family Father - Runs In Family Death In The Family Mother - Runs In Family Diabetes - Sister, Mother Family Health Status Number - Runs In Family fibromyalgia - Mother Glioblastoma Multiforme (Grade IV) Of The Brain - Father Heart Disease - Mother Malignant Melanoma Of The Skin - Sister Pure Hypercholesterolemia - Father skin cancer - Mother, Father Stroke Syndrome - Mother Ulcer, Gastric - Mother   SOCIAL HISTORY: Marital Status: Married Preferred Language: English; Ethnicity: Not Hispanic Or Latino; Race: White Current Smoking Status: Patient has never smoked.  Does not use smokeless tobacco. Has never drank.  Does not use drugs. Drinks 1 caffeinated drink per day. 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    REVIEW OF SYSTEMS:    GU Review Female:   Patient denies frequent urination, hard to postpone urination, burning /pain with urination, get up at night to urinate, leakage of urine, stream starts and stops, trouble starting your stream, have to strain to urinate, and being pregnant.  Gastrointestinal (Upper):   Patient denies nausea, vomiting, and indigestion/ heartburn.  Gastrointestinal (Lower):   Patient denies diarrhea and constipation.  Constitutional:   Patient denies fever, night sweats, weight loss, and fatigue.  Skin:   Patient denies skin rash/ lesion and itching.  Eyes:   Patient denies blurred vision and double vision.  Ears/ Nose/ Throat:   Patient denies sore throat and sinus problems.   Hematologic/Lymphatic:   Patient denies swollen glands and easy bruising.  Cardiovascular:   Patient denies leg swelling and chest pains.  Respiratory:   Patient denies cough and shortness of breath.  Endocrine:   Patient denies excessive thirst.  Musculoskeletal:   Patient denies back pain and joint pain.  Neurological:   Patient denies headaches and dizziness.  Psychologic:   Patient denies depression and anxiety.   VITAL SIGNS: None   MULTI-SYSTEM PHYSICAL EXAMINATION:    Constitutional: Well-nourished. No physical deformities. Normally developed. Good grooming.  Respiratory: Normal breath sounds. No labored breathing, no use of accessory muscles.   Cardiovascular: Regular rate and rhythm. No murmur, no gallop.      PAST DATA REVIEWED:  Source Of History:  Patient  Urine Test Review:   Urinalysis   PROCEDURES: None   ASSESSMENT:      ICD-10 Details  1 GU:   Interstitial Cystitis (w/o hematuria) - N30.10    PLAN:           Document Letter(s):  Created for Patient: Clinical Summary         Notes:   Proceed with Cystoscopy and HOD with clorpactin on 04/02/19. She is aware of the risks.

## 2019-04-02 ENCOUNTER — Ambulatory Visit (HOSPITAL_BASED_OUTPATIENT_CLINIC_OR_DEPARTMENT_OTHER): Payer: PPO | Admitting: Anesthesiology

## 2019-04-02 ENCOUNTER — Encounter (HOSPITAL_BASED_OUTPATIENT_CLINIC_OR_DEPARTMENT_OTHER): Payer: Self-pay | Admitting: Emergency Medicine

## 2019-04-02 ENCOUNTER — Other Ambulatory Visit: Payer: Self-pay

## 2019-04-02 ENCOUNTER — Ambulatory Visit (HOSPITAL_BASED_OUTPATIENT_CLINIC_OR_DEPARTMENT_OTHER)
Admission: RE | Admit: 2019-04-02 | Discharge: 2019-04-02 | Disposition: A | Discharge status: Home / Self Care | Payer: PPO | Attending: Urology | Admitting: Urology

## 2019-04-02 ENCOUNTER — Encounter (HOSPITAL_BASED_OUTPATIENT_CLINIC_OR_DEPARTMENT_OTHER)
Admission: RE | Disposition: A | Discharge status: Home / Self Care | Payer: Self-pay | Source: Home / Self Care | Attending: Urology

## 2019-04-02 DIAGNOSIS — Z8673 Personal history of transient ischemic attack (TIA), and cerebral infarction without residual deficits: Secondary | ICD-10-CM | POA: Insufficient documentation

## 2019-04-02 DIAGNOSIS — F419 Anxiety disorder, unspecified: Secondary | ICD-10-CM | POA: Diagnosis not present

## 2019-04-02 DIAGNOSIS — Z20828 Contact with and (suspected) exposure to other viral communicable diseases: Secondary | ICD-10-CM | POA: Diagnosis not present

## 2019-04-02 DIAGNOSIS — K219 Gastro-esophageal reflux disease without esophagitis: Secondary | ICD-10-CM | POA: Insufficient documentation

## 2019-04-02 DIAGNOSIS — Z79899 Other long term (current) drug therapy: Secondary | ICD-10-CM | POA: Insufficient documentation

## 2019-04-02 DIAGNOSIS — F329 Major depressive disorder, single episode, unspecified: Secondary | ICD-10-CM | POA: Insufficient documentation

## 2019-04-02 DIAGNOSIS — N3010 Interstitial cystitis (chronic) without hematuria: Secondary | ICD-10-CM | POA: Diagnosis not present

## 2019-04-02 DIAGNOSIS — I10 Essential (primary) hypertension: Secondary | ICD-10-CM | POA: Insufficient documentation

## 2019-04-02 DIAGNOSIS — E785 Hyperlipidemia, unspecified: Secondary | ICD-10-CM | POA: Diagnosis not present

## 2019-04-02 DIAGNOSIS — J449 Chronic obstructive pulmonary disease, unspecified: Secondary | ICD-10-CM | POA: Diagnosis not present

## 2019-04-02 DIAGNOSIS — E559 Vitamin D deficiency, unspecified: Secondary | ICD-10-CM | POA: Insufficient documentation

## 2019-04-02 DIAGNOSIS — E7800 Pure hypercholesterolemia, unspecified: Secondary | ICD-10-CM | POA: Diagnosis not present

## 2019-04-02 HISTORY — PX: CYSTO WITH HYDRODISTENSION: SHX5453

## 2019-04-02 LAB — POCT I-STAT, CHEM 8
BUN: 13 mg/dL (ref 8–23)
Calcium, Ion: 1.3 mmol/L (ref 1.15–1.40)
Chloride: 105 mmol/L (ref 98–111)
Creatinine, Ser: 1 mg/dL (ref 0.44–1.00)
Glucose, Bld: 100 mg/dL — ABNORMAL HIGH (ref 70–99)
HCT: 41 % (ref 36.0–46.0)
Hemoglobin: 13.9 g/dL (ref 12.0–15.0)
Potassium: 4.1 mmol/L (ref 3.5–5.1)
Sodium: 142 mmol/L (ref 135–145)
TCO2: 27 mmol/L (ref 22–32)

## 2019-04-02 SURGERY — CYSTOSCOPY, WITH BLADDER HYDRODISTENSION
Anesthesia: Monitor Anesthesia Care | Site: Bladder

## 2019-04-02 MED ORDER — KETOROLAC TROMETHAMINE 30 MG/ML IJ SOLN
INTRAMUSCULAR | Status: DC | PRN
  Administered 2019-04-02: 30 mg via INTRAVENOUS

## 2019-04-02 MED ORDER — FENTANYL CITRATE (PF) 100 MCG/2ML IJ SOLN
INTRAMUSCULAR | Status: AC
  Filled 2019-04-02: qty 2

## 2019-04-02 MED ORDER — LIDOCAINE 2% (20 MG/ML) 5 ML SYRINGE
INTRAMUSCULAR | Status: AC
  Filled 2019-04-02: qty 5

## 2019-04-02 MED ORDER — PROPOFOL 10 MG/ML IV BOLUS
INTRAVENOUS | Status: AC
  Filled 2019-04-02: qty 20

## 2019-04-02 MED ORDER — PROMETHAZINE HCL 25 MG/ML IJ SOLN
INTRAMUSCULAR | Status: AC
  Filled 2019-04-02: qty 1

## 2019-04-02 MED ORDER — SODIUM CHLORIDE 0.9% FLUSH
3.0000 mL | Freq: Two times a day (BID) | INTRAVENOUS | Status: DC
  Filled 2019-04-02: qty 3

## 2019-04-02 MED ORDER — OXYCHLOROSENE SODIUM POWD
Freq: Once | Status: DC
  Filled 2019-04-02: qty 2

## 2019-04-02 MED ORDER — ONDANSETRON HCL 4 MG/2ML IJ SOLN
INTRAMUSCULAR | Status: DC | PRN
  Administered 2019-04-02: 4 mg via INTRAVENOUS

## 2019-04-02 MED ORDER — SODIUM CHLORIDE 0.9 % IV SOLN
250.0000 mL | INTRAVENOUS | Status: DC | PRN
  Filled 2019-04-02: qty 250

## 2019-04-02 MED ORDER — PROMETHAZINE HCL 25 MG/ML IJ SOLN
6.2500 mg | INTRAMUSCULAR | Status: DC | PRN
  Filled 2019-04-02: qty 1

## 2019-04-02 MED ORDER — MEPERIDINE HCL 25 MG/ML IJ SOLN
6.2500 mg | INTRAMUSCULAR | Status: DC | PRN
  Filled 2019-04-02: qty 1

## 2019-04-02 MED ORDER — MEPERIDINE HCL 50 MG/ML IJ SOLN
50.0000 mg | INTRAMUSCULAR | Status: DC | PRN
  Filled 2019-04-02: qty 1

## 2019-04-02 MED ORDER — PROPOFOL 10 MG/ML IV BOLUS
INTRAVENOUS | Status: DC | PRN
  Administered 2019-04-02: 20 mg via INTRAVENOUS
  Administered 2019-04-02: 30 mg via INTRAVENOUS

## 2019-04-02 MED ORDER — MIDAZOLAM HCL 2 MG/2ML IJ SOLN
INTRAMUSCULAR | Status: AC
  Filled 2019-04-02: qty 2

## 2019-04-02 MED ORDER — KETOROLAC TROMETHAMINE 30 MG/ML IJ SOLN
INTRAMUSCULAR | Status: AC
  Filled 2019-04-02: qty 1

## 2019-04-02 MED ORDER — MEPERIDINE HCL 50 MG/ML IJ SOLN
50.0000 mg | Freq: Once | INTRAMUSCULAR | Status: AC
  Administered 2019-04-02: 50 mg via INTRAMUSCULAR
  Filled 2019-04-02: qty 1

## 2019-04-02 MED ORDER — PROPOFOL 500 MG/50ML IV EMUL
INTRAVENOUS | Status: DC | PRN
  Administered 2019-04-02: 100 ug/kg/min via INTRAVENOUS

## 2019-04-02 MED ORDER — ACETAMINOPHEN 500 MG PO TABS
ORAL_TABLET | ORAL | Status: AC
  Filled 2019-04-02: qty 2

## 2019-04-02 MED ORDER — PROPOFOL 500 MG/50ML IV EMUL
INTRAVENOUS | Status: AC
  Filled 2019-04-02: qty 50

## 2019-04-02 MED ORDER — MEPERIDINE HCL 25 MG/ML IJ SOLN
INTRAMUSCULAR | Status: AC
  Filled 2019-04-02: qty 1

## 2019-04-02 MED ORDER — ACETAMINOPHEN 325 MG PO TABS
650.0000 mg | ORAL_TABLET | ORAL | Status: DC | PRN
  Filled 2019-04-02: qty 2

## 2019-04-02 MED ORDER — NON FORMULARY
Freq: Once | Status: AC
  Administered 2019-04-02: 12:00:00 via INTRAVESICAL

## 2019-04-02 MED ORDER — PROMETHAZINE HCL 25 MG/ML IJ SOLN
25.0000 mg | Freq: Once | INTRAMUSCULAR | Status: AC
  Administered 2019-04-02: 25 mg via INTRAMUSCULAR
  Filled 2019-04-02: qty 1

## 2019-04-02 MED ORDER — FENTANYL CITRATE (PF) 100 MCG/2ML IJ SOLN
INTRAMUSCULAR | Status: DC | PRN
  Administered 2019-04-02 (×2): 50 ug via INTRAVENOUS

## 2019-04-02 MED ORDER — OXYCHLOROSENE SODIUM POWD
Status: DC | PRN
  Administered 2019-04-02: 1

## 2019-04-02 MED ORDER — MIDAZOLAM HCL 5 MG/5ML IJ SOLN
INTRAMUSCULAR | Status: DC | PRN
  Administered 2019-04-02 (×2): 1 mg via INTRAVENOUS

## 2019-04-02 MED ORDER — LIDOCAINE HCL (CARDIAC) PF 100 MG/5ML IV SOSY
PREFILLED_SYRINGE | INTRAVENOUS | Status: DC | PRN
  Administered 2019-04-02: 50 mg via INTRAVENOUS

## 2019-04-02 MED ORDER — SCOPOLAMINE 1 MG/3DAYS TD PT72
1.0000 | MEDICATED_PATCH | Freq: Once | TRANSDERMAL | Status: DC
  Administered 2019-04-02: 1.5 mg via TRANSDERMAL
  Filled 2019-04-02: qty 1

## 2019-04-02 MED ORDER — FENTANYL CITRATE (PF) 100 MCG/2ML IJ SOLN
25.0000 ug | INTRAMUSCULAR | Status: DC | PRN
  Filled 2019-04-02: qty 1

## 2019-04-02 MED ORDER — SCOPOLAMINE 1 MG/3DAYS TD PT72
MEDICATED_PATCH | TRANSDERMAL | Status: AC
  Filled 2019-04-02: qty 1

## 2019-04-02 MED ORDER — ACETAMINOPHEN 500 MG PO TABS
1000.0000 mg | ORAL_TABLET | Freq: Once | ORAL | Status: AC
  Administered 2019-04-02: 1000 mg via ORAL
  Filled 2019-04-02: qty 2

## 2019-04-02 MED ORDER — CIPROFLOXACIN IN D5W 400 MG/200ML IV SOLN
400.0000 mg | INTRAVENOUS | Status: AC
  Administered 2019-04-02: 400 mg via INTRAVENOUS
  Filled 2019-04-02: qty 200

## 2019-04-02 MED ORDER — LACTATED RINGERS IV SOLN
INTRAVENOUS | Status: DC
  Administered 2019-04-02: 09:00:00 via INTRAVENOUS
  Filled 2019-04-02: qty 1000

## 2019-04-02 MED ORDER — ACETAMINOPHEN 650 MG RE SUPP
650.0000 mg | RECTAL | Status: DC | PRN
  Filled 2019-04-02: qty 1

## 2019-04-02 MED ORDER — CIPROFLOXACIN IN D5W 400 MG/200ML IV SOLN
INTRAVENOUS | Status: AC
  Filled 2019-04-02: qty 200

## 2019-04-02 MED ORDER — DEXAMETHASONE SODIUM PHOSPHATE 10 MG/ML IJ SOLN
INTRAMUSCULAR | Status: AC
  Filled 2019-04-02: qty 1

## 2019-04-02 MED ORDER — SODIUM CHLORIDE 0.9% FLUSH
3.0000 mL | INTRAVENOUS | Status: DC | PRN
  Filled 2019-04-02: qty 3

## 2019-04-02 MED ORDER — DEXAMETHASONE SODIUM PHOSPHATE 4 MG/ML IJ SOLN
INTRAMUSCULAR | Status: DC | PRN
  Administered 2019-04-02: 4 mg via INTRAVENOUS

## 2019-04-02 MED ORDER — PROMETHAZINE HCL 25 MG/ML IJ SOLN
25.0000 mg | Freq: Four times a day (QID) | INTRAMUSCULAR | Status: DC | PRN
  Filled 2019-04-02: qty 1

## 2019-04-02 MED ORDER — STERILE WATER FOR IRRIGATION IR SOLN
Status: DC | PRN
  Administered 2019-04-02: 3000 mL via INTRAVESICAL

## 2019-04-02 MED ORDER — ONDANSETRON HCL 4 MG/2ML IJ SOLN
INTRAMUSCULAR | Status: AC
  Filled 2019-04-02: qty 2

## 2019-04-02 MED ORDER — MIDAZOLAM HCL 2 MG/2ML IJ SOLN
0.5000 mg | Freq: Once | INTRAMUSCULAR | Status: DC | PRN
  Filled 2019-04-02: qty 2

## 2019-04-02 SURGICAL SUPPLY — 23 items
BAG DRAIN URO-CYSTO SKYTR STRL (DRAIN) ×2 IMPLANT
BAG DRN RND TRDRP ANRFLXCHMBR (UROLOGICAL SUPPLIES) ×1
BAG DRN UROCATH (DRAIN) ×1
BAG URINE DRAIN 2000ML AR STRL (UROLOGICAL SUPPLIES) ×1 IMPLANT
CATH ROBINSON RED A/P 16FR (CATHETERS) ×1 IMPLANT
CLOTH BEACON ORANGE TIMEOUT ST (SAFETY) ×2 IMPLANT
ELECT REM PT RETURN 9FT ADLT (ELECTROSURGICAL)
ELECTRODE REM PT RTRN 9FT ADLT (ELECTROSURGICAL) ×1 IMPLANT
GLOVE BIO SURGEON STRL SZ 6.5 (GLOVE) ×1 IMPLANT
GLOVE BIOGEL PI IND STRL 7.0 (GLOVE) IMPLANT
GLOVE BIOGEL PI INDICATOR 7.0 (GLOVE) ×1
GLOVE SURG SS PI 8.0 STRL IVOR (GLOVE) ×2 IMPLANT
GOWN STRL REUS W/TWL XL LVL3 (GOWN DISPOSABLE) ×3 IMPLANT
KIT TURNOVER CYSTO (KITS) ×2 IMPLANT
MANIFOLD NEPTUNE II (INSTRUMENTS) ×1 IMPLANT
NDL SAFETY ECLIPSE 18X1.5 (NEEDLE) IMPLANT
NEEDLE HYPO 18GX1.5 SHARP (NEEDLE)
NS IRRIG 500ML POUR BTL (IV SOLUTION) IMPLANT
PACK CYSTO (CUSTOM PROCEDURE TRAY) ×2 IMPLANT
PLUG CATH AND CAP STER (CATHETERS) ×1 IMPLANT
SYR 30ML LL (SYRINGE) ×2 IMPLANT
TUBE CONNECTING 12X1/4 (SUCTIONS) ×1 IMPLANT
WATER STERILE IRR 3000ML UROMA (IV SOLUTION) ×2 IMPLANT

## 2019-04-02 NOTE — Op Note (Signed)
Procedure: 1.  Cystoscopy with hydrodistention of the bladder. 2.  Urethral dilation. 3.  Instillation of Clorpactin and tetracaine.  Preop diagnosis: Interstitial cystitis.  Postop diagnosis: Same.  Surgeon: Dr. [name].  Anesthesia: MAC.  Drains: 12 French Foley catheter.  Specimen: None.  EBL: None.  Complications: None.  Indications: Brenda [name] is a 68 year old female with a long history of interstitial cystitis who requires intermittent hydrodistention of the bladder with instillation of Clorpactin.  She has recurrent symptoms and wished to proceed with hydrodistention.  She is requested urethral dilation because of the splaying urinary stream.  Procedure: She was given 2 g of Ancef.  She was placed in lithotomy position, PCA hose were placed and she was given sedation as indicated.  She was prepped with Betadine solution and draped in usual sterile fashion.  Cystoscopy was performed using the 23 [place] scope and 30 degree lens.  Examination revealed a normal urethra without stenosis.  The bladder wall had mild trabeculation without tumors, stones or inflammation.  Ureteral orifice ease were unremarkable.  The bladder was then dilated under 80 cm of water pressure to capacity.  The dilation was held for approximately 2 minutes and the bladder was drained.  Her capacity under anesthesia was approximately 500 mL and repeat cystoscopy following dilation demonstrated diffuse glomerulations consistent with her diagnosis of interstitial cystitis.  Her urethra was then calibrated with female sounds from 22-32 [place].  No significant stenosis was identified.  A 16 French Foley catheter was then inserted and the balloon was filled with 10 mL of sterile fluid.  The bladder was drained and Clorpactin 1 ampoule in 1 L of saline was mixed in 180 mL was instilled in left indwelling for approximately 2 minutes.  The bladder was then drained and 15 mL of tetracaine solution were then instilled.   The catheter was plugged and will be placed to drainage 15 minutes after instillation.  She was taken down from lithotomy position and moved to recovery in stable condition.  There were no complications.

## 2019-04-02 NOTE — Discharge Instructions (Signed)
CYSTOSCOPY HOME CARE INSTRUCTIONS  Activity: Rest for the remainder of the day.  Do not drive or operate equipment today.  You may resume normal activities in one to two days as instructed by your physician.   Meals: Drink plenty of liquids and eat light foods such as gelatin or soup this evening.  You may return to a normal meal plan tomorrow.  Return to Work: You may return to work in one to two days or as instructed by your physician.  Special Instructions / Symptoms: Call your physician if any of these symptoms occur:    Post Anesthesia Home Care Instructions  Activity: Get plenty of rest for the remainder of the day. A responsible individual must stay with you for 24 hours following the procedure.  For the next 24 hours, DO NOT: -Drive a car -Paediatric nurse -Drink alcoholic beverages -Take any medication unless instructed by your physician -Make any legal decisions or sign important papers.  Meals: Start with liquid foods such as gelatin or soup. Progress to regular foods as tolerated. Avoid greasy, spicy, heavy foods. If nausea and/or vomiting occur, drink only clear liquids until the nausea and/or vomiting subsides. Call your physician if vomiting continues.  Special Instructions/Symptoms: Your throat may feel dry or sore from the anesthesia or the breathing tube placed in your throat during surgery. If this causes discomfort, gargle with warm salt water. The discomfort should disappear within 24 hours.  If you had a scopolamine patch placed behind your ear for the management of post- operative nausea and/or vomiting:  1. The medication in the patch is effective for 72 hours, after which it should be removed.  Wrap patch in a tissue and discard in the trash. Wash hands thoroughly with soap and water. 2. You may remove the patch earlier than 72 hours if you experience unpleasant side effects which may include dry mouth, dizziness or visual disturbances. 3. Avoid touching  the patch. Wash your hands with soap and water after contact with the patch.                -persistent or heavy bleeding  -bleeding which continues after first few urination  -large blood clots that are difficult to pass  -urine stream diminishes or stops completely  -fever equal to or higher than 101 degrees Farenheit.  -cloudy urine with a strong, foul odor  -severe pain  Females should always wipe from front to back after elimination.  You may feel some burning pain when you urinate.  This should disappear with time.  Applying moist heat to the lower abdomen or a hot tub bath may help relieve the pain. \    Patient Signature:  ________________________________________________________  Nurse's Signature:  ________________________________________________________

## 2019-04-02 NOTE — Interval H&P Note (Signed)
She has requested a urethral dilation with the procedure for spraying of the stream.  She has had this in the past.

## 2019-04-02 NOTE — Anesthesia Postprocedure Evaluation (Signed)
Anesthesia Post Note  Patient: Brenda [name]  Procedure(s) Performed: CYSTOSCOPY/HYDRODISTENSION INSTILL CHLORPACTIN (N/A Bladder)     Patient location during evaluation: Phase II Anesthesia Type: MAC Level of consciousness: awake and alert, patient cooperative and oriented Pain management: pain level controlled Vital Signs Assessment: post-procedure vital signs reviewed and stable Respiratory status: spontaneous breathing, respiratory function stable and nonlabored ventilation Cardiovascular status: blood pressure returned to baseline and stable Postop Assessment: no apparent nausea or vomiting, adequate PO intake and able to ambulate Anesthetic complications: no    Last Vitals:  Vitals:   04/02/19 1130 04/02/19 1215  BP: 130/87 (!) 146/81  Pulse: 61 70  Resp: 12 16  Temp:  (!) 36.3 C  SpO2: 95% 95%    Last Pain:  Vitals:   04/02/19 1130  TempSrc:   PainSc: 6                  Shalaunda Weatherholtz,E. Manmeet Arzola

## 2019-04-02 NOTE — Anesthesia Preprocedure Evaluation (Addendum)
Anesthesia Evaluation  Patient identified by MRN, date of birth, ID band Patient awake    Reviewed: Allergy & Precautions, NPO status , Patient's Chart, lab work & pertinent test results, reviewed documented beta blocker date and time   History of Anesthesia Complications (+) PONV  Airway Mallampati: II  TM Distance: >3 FB Neck ROM: Full    Dental  (+) Caps, Dental Advisory Given Pt requests her dental guard :   Pulmonary COPD,  COPD inhaler,  03/30/2019 SARS CoV2 NEG   breath sounds clear to auscultation       Cardiovascular hypertension, Pt. on medications and Pt. on home beta blockers (-) angina Rhythm:Regular Rate:Normal     Neuro/Psych  Headaches, Anxiety Depression CVA, No Residual Symptoms    GI/Hepatic Neg liver ROS, GERD  Medicated and Controlled,  Endo/Other  negative endocrine ROS  Renal/GU negative Renal ROS     Musculoskeletal  (+) Fibromyalgia -  Abdominal   Peds  Hematology negative hematology ROS (+)   Anesthesia Other Findings   Reproductive/Obstetrics                            Anesthesia Physical Anesthesia Plan  ASA: II  Anesthesia Plan: MAC   Post-op Pain Management:    Induction: Intravenous  PONV Risk Score and Plan: 3 and Scopolamine patch - Pre-op, Ondansetron and Dexamethasone  Airway Management Planned: Simple Face Mask and Natural Airway  Additional Equipment:   Intra-op Plan:   Post-operative Plan:   Informed Consent: I have reviewed the patients History and Physical, chart, labs and discussed the procedure including the risks, benefits and alternatives for the proposed anesthesia with the patient or authorized representative who has indicated his/her understanding and acceptance.     Dental advisory given  Plan Discussed with: CRNA and Surgeon  Anesthesia Plan Comments:         Anesthesia Quick Evaluation

## 2019-04-02 NOTE — Transfer of Care (Signed)
  Last Vitals:  Vitals Value Taken Time  BP 141/91 04/02/19 1111  Temp    Pulse 70 04/02/19 1114  Resp 17 04/02/19 1114  SpO2 100 % 04/02/19 1114  Vitals shown include unvalidated device data.  Last Pain:  Vitals:   04/02/19 0851  TempSrc: Oral  PainSc: 4       Patients Stated Pain Goal: 5 (04/02/19 4010)  Immediate Anesthesia Transfer of Care Note  Patient: Brenda [name]  Procedure(s) Performed: Procedure(s) (LRB): CYSTOSCOPY/HYDRODISTENSION INSTILL CHLORPACTIN (N/A)  Patient Location: PACU  Anesthesia Type: MAC  Level of Consciousness: awake, alert  and oriented  Airway & Oxygen Therapy: Patient Spontanous Breathing and Patient connected to face mask oxygen  Post-op Assessment: Report given to PACU RN and Post -op Vital signs reviewed and stable  Post vital signs: Reviewed and stable  Complications: No apparent anesthesia complications

## 2019-04-02 NOTE — Anesthesia Procedure Notes (Signed)
Procedure Name: MAC Date/Time: 04/02/2019 10:42 AM Performed by: Mechele Claude, CRNA Pre-anesthesia Checklist: Patient identified, Emergency Drugs available, Suction available and Patient being monitored Oxygen Delivery Method: Simple face mask Placement Confirmation: positive ETCO2 and breath sounds checked- equal and bilateral

## 2019-04-03 ENCOUNTER — Encounter (HOSPITAL_BASED_OUTPATIENT_CLINIC_OR_DEPARTMENT_OTHER): Payer: Self-pay | Admitting: Urology

## 2019-04-06 DIAGNOSIS — I10 Essential (primary) hypertension: Secondary | ICD-10-CM | POA: Diagnosis not present

## 2019-04-27 DIAGNOSIS — N3010 Interstitial cystitis (chronic) without hematuria: Secondary | ICD-10-CM | POA: Diagnosis not present

## 2019-04-29 ENCOUNTER — Other Ambulatory Visit: Payer: Self-pay | Admitting: Psychiatry

## 2019-04-29 DIAGNOSIS — F5105 Insomnia due to other mental disorder: Secondary | ICD-10-CM

## 2019-04-29 NOTE — Telephone Encounter (Signed)
Due back in Feb

## 2019-05-13 ENCOUNTER — Telehealth: Payer: Self-pay

## 2019-05-13 ENCOUNTER — Other Ambulatory Visit: Payer: Self-pay | Admitting: Internal Medicine

## 2019-05-13 DIAGNOSIS — Z1231 Encounter for screening mammogram for malignant neoplasm of breast: Secondary | ICD-10-CM

## 2019-05-13 NOTE — Telephone Encounter (Signed)
Received call from Brenda [name] requesting call back from [place], [place]. She would like to know if she should continue with breast massages. She also has a mammogram scheduled in March, and would like to know if this is ok to have so soon after her surgery, which was on 03/18/2019.

## 2019-05-14 NOTE — Telephone Encounter (Signed)
Call back to pt re: her questions about when she can safely have her mammogram, it is scheduled in March & she would like to know if she should continue the massage to her breast She reports that she still has firmness behind the nipple/areola area on the right side, and some firmness on left- but this has decreased in size & tenderness on the left She denies any of the following: no redness, no increased swelling, no drainage, & no fever or chills I informed her that I will consult with Dr. Marla Roe & will call her back with the answers to her questions

## 2019-05-26 ENCOUNTER — Telehealth: Payer: Self-pay | Admitting: Plastic Surgery

## 2019-05-26 NOTE — Telephone Encounter (Signed)
Pt called to follow up on her questions "about when she can safely have her mammogram, it is scheduled in March & she would like to know if she should continue the massage to her breast She reports that she still has firmness behind the nipple/areola area on the right side, and some firmness on left- but this has decreased in size & tenderness on the left She denies any of the following: no redness, no increased swelling, no drainage, & no fever or chills I informed her that I will consult with Dr. Marla Roe & will call her back with the answers to her questions"

## 2019-05-26 NOTE — Telephone Encounter (Signed)
Spoke with the patient.  Recommend she wait 6 months after her breast reduction for her mammogram. States the areas of firmness have improved, but have not yet completely resolved. She can continue to massage the areas of firmness. If they don't continue to reduce in size or if they start becoming painful, red, swollen, or she has fevers she should call us and come in.

## 2019-05-28 DIAGNOSIS — N3010 Interstitial cystitis (chronic) without hematuria: Secondary | ICD-10-CM | POA: Diagnosis not present

## 2019-06-09 ENCOUNTER — Ambulatory Visit (INDEPENDENT_AMBULATORY_CARE_PROVIDER_SITE_OTHER): Payer: PPO | Admitting: Psychiatry

## 2019-06-09 ENCOUNTER — Other Ambulatory Visit: Payer: Self-pay

## 2019-06-09 ENCOUNTER — Encounter: Payer: Self-pay | Admitting: Psychiatry

## 2019-06-09 DIAGNOSIS — F5105 Insomnia due to other mental disorder: Secondary | ICD-10-CM

## 2019-06-09 DIAGNOSIS — F4001 Agoraphobia with panic disorder: Secondary | ICD-10-CM

## 2019-06-09 DIAGNOSIS — F330 Major depressive disorder, recurrent, mild: Secondary | ICD-10-CM | POA: Diagnosis not present

## 2019-06-09 DIAGNOSIS — F411 Generalized anxiety disorder: Secondary | ICD-10-CM

## 2019-06-09 MED ORDER — FLUOXETINE HCL 10 MG PO TABS: 15.0000 mg | ORAL_TABLET | Freq: Every day | ORAL | 1 refills | Status: DC

## 2019-06-09 MED ORDER — LORAZEPAM 1 MG PO TABS: 1.0000 mg | ORAL_TABLET | Freq: Three times a day (TID) | ORAL | 3 refills | Status: DC | PRN

## 2019-06-09 NOTE — Progress Notes (Signed)
Brenda [name] [id number] [birth year] 69 y.o.  Subjective:   Patient ID:  Brenda [name] is a 69 y.o. (DOB 1951/03/13) female.  Chief Complaint:  Chief Complaint  Patient presents with  . Follow-up    Medication Management  . Depression    Medication Management  . Other    Panic disorder with agoraphobia  . Anxiety    Anxiety Symptoms include nervous/anxious behavior. Patient reports no confusion, decreased concentration, shortness of breath or suicidal ideas.    Depression        Associated symptoms include myalgias.  Associated symptoms include no decreased concentration and no suicidal ideas.  Past medical history includes anxiety.    Brenda [name] presents to the office today for follow-up of anxiety and poor sleep.   Last seen August 2020.  No meds were changed.  Bad dreams still hot flashes.  Goes to bed thinking of worries.  Sleep not great and not terrib le.  Wants occ Ambien. Doesn't sleep as well if can't take Ambien at times.  No amnesia.  Dreams sometimes awaken her.  Hot flashes awaken her nightly and has them daytime too.  Occ panic out of sleep.  Depressed with no family left and no kids.    Retired but works 2 days/week.  Depressed some at other times of the year but worse at holidays per usual.  .  Still works about 2 days a week.  Better energy overall with less work.  Eats out a lot still but little else. H history lymphoma and when sick it's hard to shake.  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  Sold [employer] home place. Trying to exercise more and needs to lose weight.  Generally takes 1 mg Ativan in am only.  Wonders about 2nd dose.  Patient reports stable mood and denies depressed or irritable moods.  Typical levels of anxiety unchanged.  Wanted to travel with retirement and Covid interferes.   Patient chronic difficulty with sleep initiation or maintenance with occ Ambien.  Might give her a little headache. Still some anxiety dreams.  Thinks she needs 2  Ativan daily but takes 1.. Denies appetite disturbance.  Patient reports that energy and motivation have been good.  Patient denies any difficulty with concentration.  Patient denies any suicidal ideation.  She is had multiple med intolerances including venlafaxine which caused vision problems and mental fogginess, Trintellix, duloxetine was blurred vision, paroxetine, Viibryd GI, buspirone, sertraline, fluoxetine, and Lexapro.   She has a history of side effects from trazodone, gabapentin, Valium which caused nightmares, Tranxene which caused tachycardia, lorazepam, zolpidem. All these meds that she did not tolerate we tried low dosages.  Review of Systems:  Review of Systems  Respiratory: Negative for shortness of breath.   Gastrointestinal: Negative for diarrhea.  Musculoskeletal: Positive for arthralgias, myalgias and neck pain. Negative for back pain.  Neurological: Negative for tremors and weakness.  Psychiatric/Behavioral: Positive for depression, dysphoric mood and sleep disturbance. Negative for agitation, behavioral problems, confusion, decreased concentration, hallucinations, self-injury and suicidal ideas. The patient is nervous/anxious. The patient is not hyperactive.     Medications: I have reviewed the patient's current medications.  Current Outpatient Medications  Medication Sig Dispense Refill  . acetaminophen (TYLENOL) 325 MG tablet Take 500 mg by mouth every 6 (six) hours as needed (for headaches).     Marland Kitchen albuterol (VENTOLIN HFA) 108 (90 Base) MCG/ACT inhaler Inhale into the lungs every 6 (six) hours as needed for wheezing or shortness of breath.    Marland Kitchen  ezetimibe (ZETIA) 10 MG tablet Take 10 mg by mouth every evening.     . famotidine (PEPCID) 20 MG tablet Take 20 mg by mouth at bedtime.    Marland Kitchen FLUoxetine (PROZAC) 10 MG tablet Take 1.5 tablets (15 mg total) by mouth daily. 135 tablet 1  . fluticasone (FLONASE) 50 MCG/ACT nasal spray Place 2 sprays into both nostrils daily.    .  folic acid (FOLVITE) 1 MG tablet Take 1 mg by mouth daily.    Marland Kitchen LORazepam (ATIVAN) 1 MG tablet Take 1 tablet (1 mg total) by mouth every 8 (eight) hours as needed for anxiety. 90 tablet 3  . losartan (COZAAR) 50 MG tablet Take 50 mg by mouth daily.    . metoprolol (LOPRESSOR) 50 MG tablet Take 25 mg by mouth 2 (two) times daily.     . Multiple Vitamins-Minerals (HAIR SKIN AND NAILS FORMULA PO) Take by mouth as needed.    . pantoprazole (PROTONIX) 40 MG tablet 40 mg.     . Probiotic Product (PROBIOTIC PO) Take 1 capsule by mouth every evening.     Marland Kitchen REPATHA SURECLICK [id number] MG/ML SOAJ Inject SQ into the abdomen every 2 weeks    . UNABLE TO FIND CALCUIM WITH VITAMIN D GUMMIES BID    . vitamin B-12 (CYANOCOBALAMIN) 1000 MCG tablet Take 1,000 mcg by mouth daily.    . Vitamin D, Ergocalciferol, (DRISDOL) 50000 UNITS CAPS Take 50,000 Units by mouth every 7 (seven) days.      Marland Kitchen zolpidem (AMBIEN) 5 MG tablet TAKE 1 TABLET AT BEDTIME AS NEEDED FOR INSOMNIA. 30 tablet 2   No current facility-administered medications for this visit.    Medication Side Effects: Other: vivid dreams  Allergies:  Allergies  Allergen Reactions  . Codeine Other (See Comments)    SEVERE ELEVATED LIVER ENZYMES  . Latex Rash  . Stadol [Butorphanol Tartrate] Swelling    Lips numb, tongue swell, tachycardia  . Adhesive [Tape] Other (See Comments)    TEARS SKIN; PLEASE USE COBAN WRAP!!  . Bupivacaine Other (See Comments)    Other Reaction: PRE-SYNCOPE, TACTYCARDIA TACHYCARDIA/ PRESYNCOPE  . Butorphanol Tartrate Other (See Comments)    NUMBNESS, TACHYCARDIA  . Clarithromycin Nausea Only and Other (See Comments)    TACHYCARDIA  . Cymbalta [Duloxetine Hcl] Other (See Comments)    Blurred vision  . Dilaudid [Hydromorphone Hcl] Itching    SEVERE ITCHING  . Elavil [Amitriptyline Hcl] Other (See Comments)    Elevated liver enzymes  . Hydrocodone Other (See Comments)    Elevated liver enzymes  . Hyoscyamine Sulfate Other  (See Comments)    Dizziness, nausea, headache  . Levbid [Hyoscyamine Sulfate] Other (See Comments)    Dizziness, nausea, headache  . Lisinopril Cough    Dry cough  . Marcaine [Bupivacaine Hcl] Other (See Comments)    TACHYCARDIA, PRESYNCOPE  . Pentazocine Lactate Other (See Comments)    Tachycardia   . Percodan [Oxycodone-Aspirin] Nausea Only  . Trazodone And Nefazodone Other (See Comments)    Tachycardia   . Vistaril [Hydroxyzine Hcl] Other (See Comments)    Altered sensorium  . Morphine And Related Itching and Rash    Elevate liver enzymes    Past Medical History:  Diagnosis Date  . Anxiety   . Arthritis   . Bladder pain   . Cervical disc disease    THINKS C 4 TO C 5  . Chronic cough    followed by dr wert and ENT dr Carol Ada  .  Chronic seasonal allergic rhinitis   . Depression   . Fibromyalgia   . Frequency of urination   . GERD (gastroesophageal reflux disease)   . Heart murmur    per pt pcp only hear's murmur occasionally, asymptomatic  . History of pancreatitis 1988-1989  . History of panic attacks   . Hyperlipidemia   . Hypertension LABILE   followed by pcp  . IBS (irritable bowel syndrome)   . Interstitial cystitis    urologist-- dr Jeffie Pollock  . Lumbar spondylosis   . Migraines   . Moderate persistent asthma    followed by pcp and dr wert   . Multiple pulmonary nodules    pulmonologist-- dr wert  . Normal cardiac stress test JULY 2009---  NORMAL  . PONV (postoperative nausea and vomiting)   . 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 Spasm of sphincter of Oddi 1987   02-13-2019  per pt no issues since she watches what medication's she takes  . Urethral stenosis S/P DILATIONS  . Urgency of urination     Family History  Problem Relation Age of Onset  . Colon cancer Maternal Grandmother   . Colon cancer Cousin   . Allergies Mother   . Asthma Mother   . Heart disease Mother    . Osteoarthritis Mother   . Diabetes Mother   . Irritable bowel syndrome Mother   . Brain cancer Father   . Osteoarthritis Father   . Colon polyps Father        adenomatous  . Heart disease Father   . Melanoma Sister   . Osteoarthritis Sister   . Irritable bowel syndrome Sister   . Diabetes Sister   . Heart disease Sister   . Stroke Sister   . Ulcerative colitis Maternal Uncle   . Stomach cancer Neg Hx   . Rheumatologic disease Neg Hx     Social History   Socioeconomic History  . Marital status: Married    Spouse name: Not on file  . Number of children: 0  . Years of education: Not on file  . Highest education level: Not on file  Occupational History  . Occupation: Therapist, sports  Tobacco Use  . Smoking status: Never Smoker  . Smokeless tobacco: Never Used  Substance and Sexual Activity  . Alcohol use: No  . Drug use: No  . Sexual activity: Not on file  Other Topics Concern  . Not on file  Social History Narrative   Married, lives with spouse   No children   RN at [employer]   No recent travel      [facility]:   Originally from [place]. Always lived in [place]. Previously has traveled to [place], [place], [place], [place], & mostly [place]. No indoor pets currently. She does have cats in her garage. No bird, mold, or hot tub exposure. No indoor plants. Mostly wood floors. She did have her bedroom carpet taken up in Summer 2017. Has mostly blinds. No wood burning fire place. Previously enjoyed Firefighter & playing piano.    Social Determinants of Health   Financial Resource Strain:   . Difficulty of Paying Living Expenses: Not on file  Food Insecurity:   . Worried About Charity fundraiser in the Last Year: Not on file  . Ran Out of Food in the Last Year: Not on file  Transportation Needs:   .  Lack of Transportation (Medical): Not on file  . Lack of Transportation (Non-Medical): Not on file  Physical Activity:   . Days of Exercise per Week: Not on file  .  Minutes of Exercise per Session: Not on file  Stress:   . Feeling of Stress : Not on file  Social Connections:   . Frequency of Communication with Friends and Family: Not on file  . Frequency of Social Gatherings with Friends and Family: Not on file  . Attends Religious Services: Not on file  . Active Member of Clubs or Organizations: Not on file  . Attends Archivist Meetings: Not on file  . Marital Status: Not on file  Intimate Partner Violence:   . Fear of Current or Ex-Partner: Not on file  . Emotionally Abused: Not on file  . Physically Abused: Not on file  . Sexually Abused: Not on file    Past Medical History, Surgical history, Social history, and Family history were reviewed and updated as appropriate.   Please see review of systems for further details on the patient's review from today.   Objective:   Physical Exam:  There were no vitals taken for this visit.  Physical Exam Constitutional:      General: She is not in acute distress.    Appearance: She is well-developed.  Musculoskeletal:        General: No deformity.  Neurological:     Mental Status: She is alert and oriented to person, place, and time.     Motor: No tremor.     Coordination: Coordination normal.     Gait: Gait normal.  Psychiatric:        Attention and Perception: Attention and perception normal.        Mood and Affect: Mood is anxious and depressed. Affect is not labile, blunt, angry or inappropriate.        Speech: Speech normal. Speech is not slurred.        Behavior: Behavior normal.        Thought Content: Thought content normal. Thought content is not paranoid. Thought content does not include homicidal or suicidal ideation. Thought content does not include homicidal or suicidal plan.        Cognition and Memory: Cognition normal.        Judgment: Judgment normal.     Comments: Insight and judgment fair to good. No auditory or visual hallucinations. No delusions.  Talkative per  usual.  Pleasant.     Lab Review:     Component Value Date/Time   NA 142 04/02/2019 0833   K 4.1 04/02/2019 0833   CL 105 04/02/2019 0833   CO2 23 12/04/2016 1351   GLUCOSE 100 (H) 04/02/2019 0833   BUN 13 04/02/2019 0833   CREATININE 1.00 04/02/2019 0833   CALCIUM 9.6 12/04/2016 1351   GFRNONAA >60 12/04/2016 1351   GFRAA >60 12/04/2016 1351       Component Value Date/Time   WBC 7.7 08/12/2017 1211   RBC 4.34 08/12/2017 1211   HGB 13.9 04/02/2019 0833   HCT 41.0 04/02/2019 0833   PLT 327.0 08/12/2017 1211   MCV 93.1 08/12/2017 1211   MCH 31.6 12/04/2016 1351   MCHC 34.0 08/12/2017 1211   RDW 13.7 08/12/2017 1211   LYMPHSABS 1.9 08/12/2017 1211   MONOABS 0.7 08/12/2017 1211   EOSABS 0.1 08/12/2017 1211   BASOSABS 0.1 08/12/2017 1211    No results found for: POCLITH, LITHIUM   No results found for:  PHENYTOIN, PHENOBARB, VALPROATE, CBMZ   Completed Genesight testing  .res Assessment: Plan:    Brenda [name] was seen today for follow-up, depression, other and anxiety.  Diagnoses and all orders for this visit:  Mild recurrent major depression (HCC) -     FLUoxetine (PROZAC) 10 MG tablet; Take 1.5 tablets (15 mg total) by mouth daily.  Panic disorder with agoraphobia -     FLUoxetine (PROZAC) 10 MG tablet; Take 1.5 tablets (15 mg total) by mouth daily. -     LORazepam (ATIVAN) 1 MG tablet; Take 1 tablet (1 mg total) by mouth every 8 (eight) hours as needed for anxiety.  Generalized anxiety disorder -     FLUoxetine (PROZAC) 10 MG tablet; Take 1.5 tablets (15 mg total) by mouth daily. -     LORazepam (ATIVAN) 1 MG tablet; Take 1 tablet (1 mg total) by mouth every 8 (eight) hours as needed for anxiety.  Insomnia due to mental condition  Med Sensitivity complicates treatment  Greater than 50% of 30 min face to face time with patient was spent on counseling and coordination of care. We discussed Chronic underlying anxiety.  Multiple med failures and intolerances have  limited overall effectiveness.  Very medication sensitive. Guarded prognosis for further improvement.  Frustrated with weight gain and blames meds.  Chronic anxiety.   Continues consistently fluoxetine 10 mg daily.  She's aware this is a low dosage. Option increase fluoxetine disc and PCP has discussed with her too.  Option retry duloxetine.  Not available as liquid.  It helped FM.    Cont Prozac per her request.  Option increase.  Ok prn ambien.  Disc amnesia.  Disc negative effect of food on efficacy and how to deal with it.  Ok. Ativan prn. Option BID dosing if needed. Risk benefit ration discussed.  We discussed the short-term risks associated with benzodiazepines including sedation and increased fall risk among others.  Discussed long-term side effect risk including dependence, potential withdrawal symptoms, and the potential eventual dose-related risk of dementia.  New studies refute this assn.  Sleep irregular DT work 2 days per week.  Sleep hygiene discussed and sleep meds may not resolve this bc she swings her schedule back and forth.  Supportive therapy dealing with Covid and lack of availability to get out of the house.  This appt was 30 mins.  FU 6 months unless she becomes open to further changes.  Lynder Parents, MD, DFAPA'    Future Appointments  Date Time Provider [place]  11/04/2019 11:00 AM [facility]    No orders of the defined types were placed in this encounter.     -------------------------------

## 2019-06-25 DIAGNOSIS — N3010 Interstitial cystitis (chronic) without hematuria: Secondary | ICD-10-CM | POA: Diagnosis not present

## 2019-07-14 ENCOUNTER — Ambulatory Visit: Payer: PPO

## 2019-07-14 DIAGNOSIS — E7849 Other hyperlipidemia: Secondary | ICD-10-CM | POA: Diagnosis not present

## 2019-07-14 DIAGNOSIS — R7301 Impaired fasting glucose: Secondary | ICD-10-CM | POA: Diagnosis not present

## 2019-07-14 DIAGNOSIS — M859 Disorder of bone density and structure, unspecified: Secondary | ICD-10-CM | POA: Diagnosis not present

## 2019-07-21 DIAGNOSIS — Z0000 Encounter for general adult medical examination without abnormal findings: Secondary | ICD-10-CM | POA: Diagnosis not present

## 2019-07-21 DIAGNOSIS — N1831 Chronic kidney disease, stage 3a: Secondary | ICD-10-CM | POA: Diagnosis not present

## 2019-07-21 DIAGNOSIS — Z1331 Encounter for screening for depression: Secondary | ICD-10-CM | POA: Diagnosis not present

## 2019-07-21 DIAGNOSIS — E669 Obesity, unspecified: Secondary | ICD-10-CM | POA: Diagnosis not present

## 2019-07-21 DIAGNOSIS — K219 Gastro-esophageal reflux disease without esophagitis: Secondary | ICD-10-CM | POA: Diagnosis not present

## 2019-07-21 DIAGNOSIS — N3010 Interstitial cystitis (chronic) without hematuria: Secondary | ICD-10-CM | POA: Diagnosis not present

## 2019-07-21 DIAGNOSIS — I129 Hypertensive chronic kidney disease with stage 1 through stage 4 chronic kidney disease, or unspecified chronic kidney disease: Secondary | ICD-10-CM | POA: Diagnosis not present

## 2019-07-21 DIAGNOSIS — M5030 Other cervical disc degeneration, unspecified cervical region: Secondary | ICD-10-CM | POA: Diagnosis not present

## 2019-07-21 DIAGNOSIS — R7301 Impaired fasting glucose: Secondary | ICD-10-CM | POA: Diagnosis not present

## 2019-07-21 DIAGNOSIS — J309 Allergic rhinitis, unspecified: Secondary | ICD-10-CM | POA: Diagnosis not present

## 2019-07-21 DIAGNOSIS — R05 Cough: Secondary | ICD-10-CM | POA: Diagnosis not present

## 2019-07-21 DIAGNOSIS — Z7689 Persons encountering health services in other specified circumstances: Secondary | ICD-10-CM | POA: Diagnosis not present

## 2019-07-21 DIAGNOSIS — F39 Unspecified mood [affective] disorder: Secondary | ICD-10-CM | POA: Diagnosis not present

## 2019-07-21 DIAGNOSIS — E785 Hyperlipidemia, unspecified: Secondary | ICD-10-CM | POA: Diagnosis not present

## 2019-08-07 DIAGNOSIS — N3010 Interstitial cystitis (chronic) without hematuria: Secondary | ICD-10-CM | POA: Diagnosis not present

## 2019-09-11 DIAGNOSIS — N3010 Interstitial cystitis (chronic) without hematuria: Secondary | ICD-10-CM | POA: Diagnosis not present

## 2019-10-09 DIAGNOSIS — N3010 Interstitial cystitis (chronic) without hematuria: Secondary | ICD-10-CM | POA: Diagnosis not present

## 2019-10-15 DIAGNOSIS — L918 Other hypertrophic disorders of the skin: Secondary | ICD-10-CM | POA: Diagnosis not present

## 2019-10-15 DIAGNOSIS — L718 Other rosacea: Secondary | ICD-10-CM | POA: Diagnosis not present

## 2019-10-15 DIAGNOSIS — L820 Inflamed seborrheic keratosis: Secondary | ICD-10-CM | POA: Diagnosis not present

## 2019-11-02 ENCOUNTER — Other Ambulatory Visit: Payer: Self-pay | Admitting: Psychiatry

## 2019-11-02 DIAGNOSIS — F5105 Insomnia due to other mental disorder: Secondary | ICD-10-CM

## 2019-11-04 ENCOUNTER — Other Ambulatory Visit: Payer: Self-pay

## 2019-11-04 ENCOUNTER — Ambulatory Visit
Admission: RE | Admit: 2019-11-04 | Discharge: 2019-11-04 | Disposition: A | Discharge status: Home / Self Care | Payer: PPO | Source: Ambulatory Visit | Attending: Internal Medicine | Admitting: Internal Medicine

## 2019-11-04 ENCOUNTER — Other Ambulatory Visit: Payer: Self-pay | Admitting: Internal Medicine

## 2019-11-04 DIAGNOSIS — N63 Unspecified lump in unspecified breast: Secondary | ICD-10-CM

## 2019-11-04 DIAGNOSIS — Z1231 Encounter for screening mammogram for malignant neoplasm of breast: Secondary | ICD-10-CM

## 2019-11-06 DIAGNOSIS — N3010 Interstitial cystitis (chronic) without hematuria: Secondary | ICD-10-CM | POA: Diagnosis not present

## 2019-11-17 DIAGNOSIS — M47812 Spondylosis without myelopathy or radiculopathy, cervical region: Secondary | ICD-10-CM | POA: Diagnosis not present

## 2019-11-17 DIAGNOSIS — M79641 Pain in right hand: Secondary | ICD-10-CM | POA: Diagnosis not present

## 2019-11-23 ENCOUNTER — Ambulatory Visit
Admission: RE | Admit: 2019-11-23 | Discharge: 2019-11-23 | Disposition: A | Discharge status: Home / Self Care | Payer: PPO | Source: Ambulatory Visit | Attending: Internal Medicine | Admitting: Internal Medicine

## 2019-11-23 ENCOUNTER — Other Ambulatory Visit: Payer: Self-pay

## 2019-11-23 ENCOUNTER — Other Ambulatory Visit: Payer: Self-pay | Admitting: Internal Medicine

## 2019-11-23 DIAGNOSIS — N63 Unspecified lump in unspecified breast: Secondary | ICD-10-CM

## 2019-11-23 DIAGNOSIS — N641 Fat necrosis of breast: Secondary | ICD-10-CM | POA: Diagnosis not present

## 2019-11-23 DIAGNOSIS — R922 Inconclusive mammogram: Secondary | ICD-10-CM | POA: Diagnosis not present

## 2019-12-08 DIAGNOSIS — N3010 Interstitial cystitis (chronic) without hematuria: Secondary | ICD-10-CM | POA: Diagnosis not present

## 2019-12-21 DIAGNOSIS — T1512XA Foreign body in conjunctival sac, left eye, initial encounter: Secondary | ICD-10-CM | POA: Diagnosis not present

## 2019-12-22 DIAGNOSIS — M47812 Spondylosis without myelopathy or radiculopathy, cervical region: Secondary | ICD-10-CM | POA: Diagnosis not present

## 2019-12-22 DIAGNOSIS — M5412 Radiculopathy, cervical region: Secondary | ICD-10-CM | POA: Diagnosis not present

## 2019-12-31 ENCOUNTER — Other Ambulatory Visit: Payer: Self-pay | Admitting: Psychiatry

## 2019-12-31 DIAGNOSIS — F411 Generalized anxiety disorder: Secondary | ICD-10-CM

## 2019-12-31 DIAGNOSIS — F4001 Agoraphobia with panic disorder: Secondary | ICD-10-CM

## 2020-01-01 DIAGNOSIS — N3010 Interstitial cystitis (chronic) without hematuria: Secondary | ICD-10-CM | POA: Diagnosis not present

## 2020-01-07 DIAGNOSIS — G5601 Carpal tunnel syndrome, right upper limb: Secondary | ICD-10-CM | POA: Diagnosis not present

## 2020-01-18 DIAGNOSIS — F39 Unspecified mood [affective] disorder: Secondary | ICD-10-CM | POA: Diagnosis not present

## 2020-01-18 DIAGNOSIS — I10 Essential (primary) hypertension: Secondary | ICD-10-CM | POA: Diagnosis not present

## 2020-01-18 DIAGNOSIS — Z23 Encounter for immunization: Secondary | ICD-10-CM | POA: Diagnosis not present

## 2020-01-18 DIAGNOSIS — N1831 Chronic kidney disease, stage 3a: Secondary | ICD-10-CM | POA: Diagnosis not present

## 2020-01-18 DIAGNOSIS — N3010 Interstitial cystitis (chronic) without hematuria: Secondary | ICD-10-CM | POA: Diagnosis not present

## 2020-01-18 DIAGNOSIS — M5030 Other cervical disc degeneration, unspecified cervical region: Secondary | ICD-10-CM | POA: Diagnosis not present

## 2020-01-18 DIAGNOSIS — M859 Disorder of bone density and structure, unspecified: Secondary | ICD-10-CM | POA: Diagnosis not present

## 2020-01-18 DIAGNOSIS — R0789 Other chest pain: Secondary | ICD-10-CM | POA: Diagnosis not present

## 2020-01-18 DIAGNOSIS — R05 Cough: Secondary | ICD-10-CM | POA: Diagnosis not present

## 2020-01-18 DIAGNOSIS — R7301 Impaired fasting glucose: Secondary | ICD-10-CM | POA: Diagnosis not present

## 2020-01-18 DIAGNOSIS — K219 Gastro-esophageal reflux disease without esophagitis: Secondary | ICD-10-CM | POA: Diagnosis not present

## 2020-01-18 DIAGNOSIS — E7849 Other hyperlipidemia: Secondary | ICD-10-CM | POA: Diagnosis not present

## 2020-01-28 DIAGNOSIS — M47812 Spondylosis without myelopathy or radiculopathy, cervical region: Secondary | ICD-10-CM | POA: Diagnosis not present

## 2020-01-29 DIAGNOSIS — J309 Allergic rhinitis, unspecified: Secondary | ICD-10-CM | POA: Diagnosis not present

## 2020-01-29 DIAGNOSIS — Z1159 Encounter for screening for other viral diseases: Secondary | ICD-10-CM | POA: Diagnosis not present

## 2020-01-29 DIAGNOSIS — R059 Cough, unspecified: Secondary | ICD-10-CM | POA: Diagnosis not present

## 2020-02-03 DIAGNOSIS — N3010 Interstitial cystitis (chronic) without hematuria: Secondary | ICD-10-CM | POA: Diagnosis not present

## 2020-02-03 DIAGNOSIS — R8271 Bacteriuria: Secondary | ICD-10-CM | POA: Diagnosis not present

## 2020-02-08 ENCOUNTER — Other Ambulatory Visit: Payer: Self-pay | Admitting: Urology

## 2020-02-10 ENCOUNTER — Encounter (HOSPITAL_BASED_OUTPATIENT_CLINIC_OR_DEPARTMENT_OTHER): Payer: Self-pay | Admitting: Urology

## 2020-02-10 ENCOUNTER — Other Ambulatory Visit: Payer: Self-pay

## 2020-02-10 NOTE — Progress Notes (Signed)
Spoke w/ via phone for pre-op interview---Brenda [name] [employer]  Lab needs dos---- Istat 8               Lab results------none  COVID test ------10/16 1120am  Arrive at -------1000 NPO after MN NO Solid Food.  Clear liquids from MN until---0900am Medications to take morning of surgery -----Inhaler if needed and bring, metoprolol, ativan if needed, protonix  Diabetic medication -----none  Patient Special Instructions -----none  Pre-Op special Istructions -----none  Patient verbalized understanding of instructions that were given at this phone interview. Patient denies shortness of breath, chest pain, fever, cough at this phone interview.

## 2020-02-13 ENCOUNTER — Other Ambulatory Visit (HOSPITAL_COMMUNITY)
Admission: RE | Admit: 2020-02-13 | Discharge: 2020-02-13 | Disposition: A | Discharge status: Home / Self Care | Payer: PPO | Source: Ambulatory Visit | Attending: Urology | Admitting: Urology

## 2020-02-13 DIAGNOSIS — Z20822 Contact with and (suspected) exposure to covid-19: Secondary | ICD-10-CM | POA: Diagnosis not present

## 2020-02-13 DIAGNOSIS — Z01812 Encounter for preprocedural laboratory examination: Secondary | ICD-10-CM | POA: Diagnosis not present

## 2020-02-13 LAB — SARS CORONAVIRUS 2 (TAT 6-24 HRS): SARS Coronavirus 2: NEGATIVE

## 2020-02-17 ENCOUNTER — Encounter (HOSPITAL_BASED_OUTPATIENT_CLINIC_OR_DEPARTMENT_OTHER): Payer: Self-pay | Admitting: Urology

## 2020-02-17 ENCOUNTER — Ambulatory Visit (HOSPITAL_BASED_OUTPATIENT_CLINIC_OR_DEPARTMENT_OTHER): Payer: PPO | Admitting: Anesthesiology

## 2020-02-17 ENCOUNTER — Encounter (HOSPITAL_BASED_OUTPATIENT_CLINIC_OR_DEPARTMENT_OTHER)
Admission: RE | Disposition: A | Discharge status: Home / Self Care | Payer: Self-pay | Source: Home / Self Care | Attending: Urology

## 2020-02-17 ENCOUNTER — Ambulatory Visit (HOSPITAL_BASED_OUTPATIENT_CLINIC_OR_DEPARTMENT_OTHER)
Admission: RE | Admit: 2020-02-17 | Discharge: 2020-02-17 | Disposition: A | Discharge status: Home / Self Care | Payer: PPO | Attending: Urology | Admitting: Urology

## 2020-02-17 DIAGNOSIS — F419 Anxiety disorder, unspecified: Secondary | ICD-10-CM | POA: Diagnosis not present

## 2020-02-17 DIAGNOSIS — J45909 Unspecified asthma, uncomplicated: Secondary | ICD-10-CM | POA: Diagnosis not present

## 2020-02-17 DIAGNOSIS — M797 Fibromyalgia: Secondary | ICD-10-CM | POA: Insufficient documentation

## 2020-02-17 DIAGNOSIS — N3010 Interstitial cystitis (chronic) without hematuria: Secondary | ICD-10-CM | POA: Insufficient documentation

## 2020-02-17 DIAGNOSIS — K219 Gastro-esophageal reflux disease without esophagitis: Secondary | ICD-10-CM | POA: Insufficient documentation

## 2020-02-17 DIAGNOSIS — I10 Essential (primary) hypertension: Secondary | ICD-10-CM | POA: Insufficient documentation

## 2020-02-17 DIAGNOSIS — M1990 Unspecified osteoarthritis, unspecified site: Secondary | ICD-10-CM | POA: Diagnosis not present

## 2020-02-17 DIAGNOSIS — Z79899 Other long term (current) drug therapy: Secondary | ICD-10-CM | POA: Diagnosis not present

## 2020-02-17 HISTORY — DX: Dyspnea, unspecified: R06.00

## 2020-02-17 HISTORY — PX: CYSTO WITH HYDRODISTENSION: SHX5453

## 2020-02-17 HISTORY — DX: Chronic kidney disease, unspecified: N18.9

## 2020-02-17 LAB — POCT I-STAT, CHEM 8
BUN: 15 mg/dL (ref 8–23)
Calcium, Ion: 1.3 mmol/L (ref 1.15–1.40)
Chloride: 104 mmol/L (ref 98–111)
Creatinine, Ser: 1.2 mg/dL — ABNORMAL HIGH (ref 0.44–1.00)
Glucose, Bld: 94 mg/dL (ref 70–99)
HCT: 45 % (ref 36.0–46.0)
Hemoglobin: 15.3 g/dL — ABNORMAL HIGH (ref 12.0–15.0)
Potassium: 4.4 mmol/L (ref 3.5–5.1)
Sodium: 142 mmol/L (ref 135–145)
TCO2: 28 mmol/L (ref 22–32)

## 2020-02-17 SURGERY — CYSTOSCOPY, WITH BLADDER HYDRODISTENSION
Anesthesia: Monitor Anesthesia Care

## 2020-02-17 MED ORDER — ONDANSETRON HCL 4 MG/2ML IJ SOLN
INTRAMUSCULAR | Status: AC
  Filled 2020-02-17: qty 2

## 2020-02-17 MED ORDER — LACTATED RINGERS IV SOLN: INTRAVENOUS | Status: DC

## 2020-02-17 MED ORDER — CIPROFLOXACIN IN D5W 400 MG/200ML IV SOLN
INTRAVENOUS | Status: AC
  Filled 2020-02-17: qty 200

## 2020-02-17 MED ORDER — FENTANYL CITRATE (PF) 100 MCG/2ML IJ SOLN
INTRAMUSCULAR | Status: AC
  Filled 2020-02-17: qty 2

## 2020-02-17 MED ORDER — MIDAZOLAM HCL 5 MG/5ML IJ SOLN
INTRAMUSCULAR | Status: DC | PRN
  Administered 2020-02-17 (×2): 1 mg via INTRAVENOUS

## 2020-02-17 MED ORDER — LIDOCAINE 2% (20 MG/ML) 5 ML SYRINGE
INTRAMUSCULAR | Status: AC
  Filled 2020-02-17: qty 5

## 2020-02-17 MED ORDER — SODIUM CHLORIDE 0.9% FLUSH: 3.0000 mL | Freq: Two times a day (BID) | INTRAVENOUS | Status: DC

## 2020-02-17 MED ORDER — MEPERIDINE HCL 25 MG/ML IJ SOLN
INTRAMUSCULAR | Status: AC
  Filled 2020-02-17: qty 2

## 2020-02-17 MED ORDER — STERILE WATER FOR IRRIGATION IR SOLN
Status: DC | PRN
  Administered 2020-02-17 (×2): 500 mL

## 2020-02-17 MED ORDER — FENTANYL CITRATE (PF) 100 MCG/2ML IJ SOLN
25.0000 ug | INTRAMUSCULAR | Status: DC | PRN
  Administered 2020-02-17 (×2): 25 ug via INTRAVENOUS

## 2020-02-17 MED ORDER — ACETAMINOPHEN 160 MG/5ML PO SOLN: 325.0000 mg | ORAL | Status: DC | PRN

## 2020-02-17 MED ORDER — STERILE WATER FOR INJECTION IJ SOLN
Status: DC | PRN
  Administered 2020-02-17: 180 mL via INTRAVESICAL

## 2020-02-17 MED ORDER — ONDANSETRON HCL 4 MG/2ML IJ SOLN
INTRAMUSCULAR | Status: DC | PRN
  Administered 2020-02-17: 4 mg via INTRAVENOUS

## 2020-02-17 MED ORDER — CIPROFLOXACIN IN D5W 400 MG/200ML IV SOLN
400.0000 mg | Freq: Once | INTRAVENOUS | Status: AC
  Administered 2020-02-17: 400 mg via INTRAVENOUS

## 2020-02-17 MED ORDER — FENTANYL CITRATE (PF) 100 MCG/2ML IJ SOLN
INTRAMUSCULAR | Status: DC | PRN
  Administered 2020-02-17 (×2): 50 ug via INTRAVENOUS

## 2020-02-17 MED ORDER — ACETAMINOPHEN 325 MG RE SUPP: 650.0000 mg | RECTAL | Status: DC | PRN

## 2020-02-17 MED ORDER — LIDOCAINE 2% (20 MG/ML) 5 ML SYRINGE
INTRAMUSCULAR | Status: DC | PRN
  Administered 2020-02-17: 60 mg via INTRAVENOUS

## 2020-02-17 MED ORDER — PROPOFOL 500 MG/50ML IV EMUL
INTRAVENOUS | Status: DC | PRN
  Administered 2020-02-17: 125 ug/kg/min via INTRAVENOUS

## 2020-02-17 MED ORDER — PROPOFOL 10 MG/ML IV BOLUS
INTRAVENOUS | Status: AC
  Filled 2020-02-17: qty 20

## 2020-02-17 MED ORDER — DEXAMETHASONE SODIUM PHOSPHATE 10 MG/ML IJ SOLN
INTRAMUSCULAR | Status: AC
  Filled 2020-02-17: qty 1

## 2020-02-17 MED ORDER — SODIUM CHLORIDE 0.9% FLUSH: 3.0000 mL | INTRAVENOUS | Status: DC | PRN

## 2020-02-17 MED ORDER — SODIUM CHLORIDE 0.9 % IV SOLN: 250.0000 mL | INTRAVENOUS | Status: DC | PRN

## 2020-02-17 MED ORDER — MEPERIDINE HCL 25 MG/ML IJ SOLN
50.0000 mg | INTRAMUSCULAR | Status: DC | PRN
  Administered 2020-02-17: 50 mg via INTRAMUSCULAR

## 2020-02-17 MED ORDER — PROPOFOL 10 MG/ML IV BOLUS
INTRAVENOUS | Status: DC | PRN
  Administered 2020-02-17: 20 mg via INTRAVENOUS

## 2020-02-17 MED ORDER — MIDAZOLAM HCL 2 MG/2ML IJ SOLN
INTRAMUSCULAR | Status: AC
  Filled 2020-02-17: qty 2

## 2020-02-17 MED ORDER — DEXAMETHASONE SODIUM PHOSPHATE 10 MG/ML IJ SOLN
INTRAMUSCULAR | Status: DC | PRN
  Administered 2020-02-17: 5 mg via INTRAVENOUS

## 2020-02-17 MED ORDER — ONDANSETRON HCL 4 MG/2ML IJ SOLN: 4.0000 mg | Freq: Once | INTRAMUSCULAR | Status: DC | PRN

## 2020-02-17 MED ORDER — ACETAMINOPHEN 325 MG PO TABS: 325.0000 mg | ORAL_TABLET | ORAL | Status: DC | PRN

## 2020-02-17 MED ORDER — STERILE WATER FOR IRRIGATION IR SOLN
Status: DC | PRN
  Administered 2020-02-17: 1500 mL

## 2020-02-17 MED ORDER — ACETAMINOPHEN 325 MG PO TABS: 650.0000 mg | ORAL_TABLET | ORAL | Status: DC | PRN

## 2020-02-17 SURGICAL SUPPLY — 19 items
BAG DRAIN URO-CYSTO SKYTR STRL (DRAIN) ×2 IMPLANT
BAG DRN UROCATH (DRAIN) ×1
CATH ROBINSON RED A/P 16FR (CATHETERS) ×1 IMPLANT
CATH SILICONE 16FRX5CC (CATHETERS) ×1 IMPLANT
CLOTH BEACON ORANGE TIMEOUT ST (SAFETY) ×2 IMPLANT
ELECT REM PT RETURN 9FT ADLT (ELECTROSURGICAL) ×2
ELECTRODE REM PT RTRN 9FT ADLT (ELECTROSURGICAL) ×1 IMPLANT
GLOVE SURG SS PI 8.0 STRL IVOR (GLOVE) ×2 IMPLANT
GOWN STRL REUS W/TWL XL LVL3 (GOWN DISPOSABLE) ×2 IMPLANT
KIT TURNOVER CYSTO (KITS) ×2 IMPLANT
MANIFOLD NEPTUNE II (INSTRUMENTS) ×2 IMPLANT
NDL SAFETY ECLIPSE 18X1.5 (NEEDLE) IMPLANT
NEEDLE HYPO 18GX1.5 SHARP (NEEDLE)
NS IRRIG 500ML POUR BTL (IV SOLUTION) ×2 IMPLANT
PACK CYSTO (CUSTOM PROCEDURE TRAY) ×2 IMPLANT
PLUG CATH AND CAP STER (CATHETERS) ×1 IMPLANT
SYR 30ML LL (SYRINGE) ×2 IMPLANT
TUBE CONNECTING 12X1/4 (SUCTIONS) IMPLANT
WATER STERILE IRR 3000ML UROMA (IV SOLUTION) ×2 IMPLANT

## 2020-02-17 NOTE — H&P (Signed)
I have interstitial cystitis.     Brenda [name] has increased nocturia and feels she needs cystoscopy with HOD which she has required many times in the past. She does have some pyuria and bacteria on a UA today and I will get a culture.     ALLERGIES: Codeine Derivatives - elevated liver enzymes Dilaudid TABS - Itching latex Lisinopril TABS - cough Marcaine SOLN - tachycardia and presyncope Morphine Derivatives - Itching, Hives, Spasm of sphincter of ODIE and pancreatitis Stadol SOLN - Swelling, tongue swelling and lips numb    MEDICATIONS: Metoprolol Tartrate 50 mg tablet 1 tablet PO Daily  Calcium Plus Vitamin D 1 PO Daily  Folic Acid 1 mg tablet 1 tablet PO Daily  Lorazepam 1 mg tablet 1 tablet PO Daily  Losartan Potassium  Probiotic 1 PO Daily  Prozac 10 mg capsule  Repatha Sureclick 272 mg/ml pen injector  Ventolin Hfa 1 PO Daily  Vitamin C 1 PO Daily  Vitamin D2 1,250 mcg (50,000 unit) capsule 1 capsule PO Daily  Zetia 10 mg tablet 1 tablet PO Daily     GU PSH: Cysto Bladder Ureth Biopsy - 2012 Cysto Dilate Stricture (M or F) - 2013 Cystoscopy Hydrodistention - 04/02/2019, 03/06/2018, 2018, 2013, 2011, 2010, 2010, 2008 D&C Non-OB - 1987 Hysterectomy Unilat SO - 1987 Ovary Removal Partial or Total - 2012 Subseq Female Urethral Dilation - 2019       PSH Notes: Bladder Irrigation- multiple(s)  Corneal LASIK Bilateral, bilateral breast reduction with mastopexy 1987 Dr. Dessie Coma     NON-GU PSH: Appendectomy - 2012 Breast Reduction, Bilateral - 10/14/2018, 1987 Cataract surgery, Bilateral Cholecystectomy (open) - 2012 Colonoscopy Diagnostic Colonoscopy - 2012 Endo Cholangiopancreatograph - 2012 Hemorrhoidectomy (favorite) - 2015 Needle Biopsy Liver - 2012 Neuroeltrd Stim Post Tibial - 2019, 2019, 2019, 2019, 2019, 2019, 2019, 2019, 2019, 2019, 2019, 2019 Nose Surgery (Unspecified) Remove Tonsils And Adenoids - 2012 Repair Septal Defect Suspend Breast - 1987     GU  PMH: Splitting of urinary stream (Worsening, Chronic), Repeat dilatation PRN - 2019 Nocturia - 2019 Pelvic/perineal pain - 2019 Interstitial Cystitis (w/o hematuria) - 2019, I will get her set up for a repeat HOD and clorpactin. She is well aware of the risks. 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 that year   NON-GU PMH: Cervicalgia, Neck pain, bilateral - 2017 Radiculopathy, cervical region, Cervical radiculopathy - 2017 Muscle weakness (generalized), Muscle weakness - 2017 Other muscle spasm, Muscle spasm - 2017 Paresthesia of skin, Paresthesia and pain of both upper extremities - 5366 Follicular disorder, unspecified, Folliculitis - 4403 Abrasion of unspecified hand, initial encounter, Abrasion of hand - 2015 Acute upper respiratory infection, unspecified, Upper respiratory infection with cough and congestion - 2015 Personal history of other diseases of the respiratory system, History of acute bronchitis - 2015 Vitamin D deficiency, unspecified, Vitamin D deficiency - 2015 Encounter for general adult medical examination without abnormal findings, Encounter for preventive health examination - 2015 Anxiety, Anxiety (Symptom) - 2014 Asthma, Asthma - 2014 Cerebral infarction, unspecified, Stroke Syndrome - 2014 Fibromyalgia, Fibromyalgia - 2014 Irritable bowel syndrome with diarrhea, Irritable Bowel Syndrome - 2014 Personal history  of other diseases of the circulatory system, History of hypertension - 2014 Personal history of other diseases of the nervous system and sense organs, History of migraine headaches  - 2014 Personal history of other endocrine, nutritional and metabolic disease, History of hypercholesterolemia - 2014 Personal history of other specified conditions, History of fibrocystic disease of breast - 2014 Rash and other nonspecific skin eruption, Rash - 2014 Abnormal results of liver function studies    FAMILY HISTORY: Acute Myocardial Infarction - Mother Bladder Cancer - Father colonic polyps - Father Congestive Heart Failure - Mother Coronary Artery Disease - Mother Death In The Family Father - Runs In Family Death In The Family Mother - Runs In Family Diabetes - Mother, Sister Family Health Status Number - Runs In Family fibromyalgia - Mother Glioblastoma Multiforme (Grade IV) Of The Brain - Father Heart Disease - Mother Malignant Melanoma Of The Skin - Sister Pure Hypercholesterolemia - Father skin cancer - Mother, Father Stroke Syndrome - Mother Ulcer, Gastric - Mother   SOCIAL HISTORY: Marital Status: Married Preferred Language: English; Ethnicity: Not Hispanic Or Latino; Race: White Current Smoking Status: Patient has never smoked.  Does not use smokeless tobacco. Has never drank.  Does not use drugs. Drinks 1 caffeinated drink per day. 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    REVIEW OF SYSTEMS:    GU Review Female:   Patient denies frequent urination, hard to postpone urination, burning /pain with urination, get up at night to urinate, leakage of urine, stream starts and stops, trouble starting your stream, have to strain to urinate, and being pregnant.  Gastrointestinal (Upper):   Patient denies nausea, vomiting, and indigestion/ heartburn.  Gastrointestinal (Lower):   Patient denies diarrhea and constipation.  Constitutional:   Patient denies fever, night sweats, weight loss, and fatigue.  Skin:   Patient denies skin rash/ lesion and itching.  Eyes:   Patient denies blurred vision and double vision.  Ears/ Nose/ Throat:   Patient denies sore  throat and sinus problems.  Hematologic/Lymphatic:   Patient denies swollen glands and easy bruising.  Cardiovascular:   Patient denies leg swelling and chest pains.  Respiratory:   Patient denies cough and shortness of breath.  Endocrine:   Patient denies excessive thirst.  Musculoskeletal:   Patient denies back pain and joint pain.  Neurological:   Patient denies headaches and dizziness.  Psychologic:   Patient denies depression and anxiety.   VITAL SIGNS: None   MULTI-SYSTEM PHYSICAL EXAMINATION:    Constitutional: Well-nourished. No physical deformities. Normally developed. Good grooming.  Respiratory: Normal breath sounds. No labored breathing, no use of accessory muscles.   Cardiovascular: Regular rate and rhythm. No murmur, no gallop.      Complexity of Data:  Records Review:   Previous Patient Records  Urine Test Review:   Urinalysis   PROCEDURES:          Urinalysis w/Scope - 81001 Dipstick Dipstick Cont'd Micro  Color: Yellow Bilirubin: Neg WBC/hpf: 10 - 20/hpf  Appearance: Slightly Cloudy Ketones: Neg RBC/hpf: 0 - 2/hpf  Specific Gravity: 1.020 Blood: Neg Bacteria: Few (10-25/hpf)  pH: 5.5 Protein: Neg Cystals: NS (Not Seen)  Glucose: Neg Urobilinogen: 0.2 Casts: NS (Not Seen)    Nitrites: Neg Trichomonas: Not Present    Leukocyte Esterase: 3+ Mucous: Present      Epithelial Cells: 0 - 5/hpf      Yeast: NS (Not Seen)      Sperm: Not Present    Notes:  ASSESSMENT:      ICD-10 Details  1 GU:   Interstitial Cystitis (w/o hematuria) - N30.10 Chronic, Worsening - I will get her scheduled for a cystoscopy and HOD.   2 NON-GU:   Bacteriuria - R82.71 Undiagnosed New Problem - She has a possible UTI today and I will get the urine cultured.    PLAN:           Schedule Return Visit/Planned Activity: Next Available Appointment - Schedule Surgery          Document Letter(s):  Created for Patient: Clinical Summary         Notes:   CC: Dr. Berneta Sages.          Next Appointment:      Next Appointment: 02/05/2020 02:00 PM    Appointment Type: Nurse Visit    Location: [facility] [phone]    Provider: NVPodA NVPodA    Reason for Visit: Cloropactin tx

## 2020-02-17 NOTE — Op Note (Signed)
procedure: Cystoscopy with hydrodistention of bladder with Clorpactin instillation followed by tetracaine instillation.   Preop diagnosis: Interstitial cystitis.  Postop diagnosis: Same.  Surgeon: Dr. [name].  Anesthesia: MAC.  Drains: 16 French silicone Foley catheter.  Specimen: None.  EBL: None.  Complications: None.  Indications: Brenda [name] is a 69 year old female with history of interstitial cystitis who requires intermittent hydrodistention of bladder with Clorpactin instillation.  Procedure: She was taken operating room where she was given Cipro.  Strict latex precautions were observed.  She was placed on the table in lithotomy position and fitted with PAS hose and sedation was given as needed.  She was prepped with Betadine solution and draped in usual sterile fashion.  Cystoscopy was performed using a 23 [place] scope and 30 degree lens.  Examination revealed a normal urethra.  The bladder wall had mild trabeculation without tumors, stones or inflammation.  Ureteral orifices were unremarkable.  The bladder was then filled under 80 cm water pressure to capacity and then drained.  Her capacity under anesthesia was approximately 450 mL.  On drainage she was noted to have diffuse glomerulations consistent with her diagnosis of interstitial cystitis.  No ulcers or bladder wall cracks were identified.  The cystoscope was removed and a 16 French silicone Foley was placed.  The balloon was filled with 10 mL of sterile fluid.  The bladder was then instilled with 200 mL of Clorpactin solution, 1 amp and 1 L.  The bladder was then drained and then instilled with 60 mL of tetracaine solution.  The catheter was then plugged.  She was taken down from lithotomy position, her anesthetic was reversed and she was moved recovery in stable condition.  There were no complications.

## 2020-02-17 NOTE — Anesthesia Postprocedure Evaluation (Signed)
Anesthesia Post Note  Patient: Brenda [name]  Procedure(s) Performed: CYSTOSCOPY/HYDRODISTENTION WITH INSTILLATION OF CHLORPACTIN (N/A )     Patient location during evaluation: PACU Anesthesia Type: MAC Level of consciousness: awake and alert Pain management: pain level controlled Vital Signs Assessment: post-procedure vital signs reviewed and stable Respiratory status: spontaneous breathing, nonlabored ventilation, respiratory function stable and patient connected to nasal cannula oxygen Cardiovascular status: stable and blood pressure returned to baseline Postop Assessment: no apparent nausea or vomiting Anesthetic complications: no   No complications documented.  Last Vitals:  Vitals:   02/17/20 1315 02/17/20 1408  BP: 135/86 124/85  Pulse: 61 65  Resp: 14 15  Temp:    SpO2: 100% 96%    Last Pain:  Vitals:   02/17/20 1308  TempSrc:   PainSc: 4                  Neriyah Cercone P Rayvin Abid

## 2020-02-17 NOTE — Transfer of Care (Signed)
Immediate Anesthesia Transfer of Care Note  Patient: Brenda [name]  Procedure(s) Performed: CYSTOSCOPY/HYDRODISTENTION WITH INSTILLATION OF CHLORPACTIN (N/A )  Patient Location: PACU  Anesthesia Type:MAC  Level of Consciousness: awake, alert  and patient cooperative  Airway & Oxygen Therapy: Patient Spontanous Breathing and Patient connected to face mask oxygen  Post-op Assessment: Report given to RN and Post -op Vital signs reviewed and stable  Post vital signs: Reviewed and stable  Last Vitals:  Vitals Value Taken Time  BP    Temp    Pulse    Resp 13 02/17/20 1244  SpO2    Vitals shown include unvalidated device data.  Last Pain:  Vitals:   02/17/20 1010  TempSrc: Oral  PainSc: 8       Patients Stated Pain Goal: 4 (56/15/37 9432)  Complications: No complications documented.

## 2020-02-17 NOTE — Anesthesia Procedure Notes (Signed)
Procedure Name: MAC Date/Time: 02/17/2020 12:16 PM Performed by: Lollie Sails, CRNA Pre-anesthesia Checklist: Patient identified, Emergency Drugs available, Suction available, Patient being monitored and Timeout performed Oxygen Delivery Method: Simple face mask

## 2020-02-17 NOTE — Discharge Instructions (Addendum)
CYSTOSCOPY HOME CARE INSTRUCTIONS  Activity: Rest for the remainder of the day.  Do not drive or operate equipment today.  You may resume normal activities in one to two days as instructed by your physician.   Meals: Drink plenty of liquids and eat light foods such as gelatin or soup this evening.  You may return to a normal meal plan tomorrow.  Return to Work: You may return to work in one to two days or as instructed by your physician.  Special Instructions / Symptoms: Call your physician if any of these symptoms occur:   -persistent or heavy bleeding  -bleeding which continues after first few urination  -large blood clots that are difficult to pass  -urine stream diminishes or stops completely  -fever equal to or higher than 101 degrees Farenheit.  -cloudy urine with a strong, foul odor  -severe pain  Females should always wipe from front to back after elimination.  You may feel some burning pain when you urinate.  This should disappear with time.  Applying moist heat to the lower abdomen or a hot tub bath may help relieve the pain. \    Patient Signature:  ________________________________________________________  Nurse's Signature:  ________________________________________________________   Post Anesthesia Home Care Instructions  Activity: Get plenty of rest for the remainder of the day. A responsible individual must stay with you for 24 hours following the procedure.  For the next 24 hours, DO NOT: -Drive a car -Operate machinery -Drink alcoholic beverages -Take any medication unless instructed by your physician -Make any legal decisions or sign important papers.  Meals: Start with liquid foods such as gelatin or soup. Progress to regular foods as tolerated. Avoid greasy, spicy, heavy foods. If nausea and/or vomiting occur, drink only clear liquids until the nausea and/or vomiting subsides. Call your physician if vomiting continues.  Special  Instructions/Symptoms: Your throat may feel dry or sore from the anesthesia or the breathing tube placed in your throat during surgery. If this causes discomfort, gargle with warm salt water. The discomfort should disappear within 24 hours.  If you had a scopolamine patch placed behind your ear for the management of post- operative nausea and/or vomiting:  1. The medication in the patch is effective for 72 hours, after which it should be removed.  Wrap patch in a tissue and discard in the trash. Wash hands thoroughly with soap and water. 2. You may remove the patch earlier than 72 hours if you experience unpleasant side effects which may include dry mouth, dizziness or visual disturbances. 3. Avoid touching the patch. Wash your hands with soap and water after contact with the patch.     

## 2020-02-17 NOTE — Anesthesia Preprocedure Evaluation (Addendum)
Anesthesia Evaluation  Patient identified by MRN, date of birth, ID band Patient awake    Reviewed: Patient's Chart, lab work & pertinent test results  History of Anesthesia Complications (+) PONV  Airway Mallampati: II  TM Distance: >3 FB Neck ROM: Full    Dental  (+) Teeth Intact   Pulmonary asthma ,    Pulmonary exam normal        Cardiovascular hypertension, Pt. on medications  Rhythm:Regular Rate:Normal     Neuro/Psych  Headaches, Anxiety Depression CVA, No Residual Symptoms    GI/Hepatic Neg liver ROS, GERD  Medicated,  Endo/Other  negative endocrine ROS  Renal/GU CRFRenal disease Bladder dysfunction  Interstitial cystitis, urethral stenosis    Musculoskeletal  (+) Arthritis , Osteoarthritis,  Fibromyalgia -  Abdominal (+)  Abdomen: soft. Bowel sounds: normal.  Peds  Hematology negative hematology ROS (+)   Anesthesia Other Findings   Reproductive/Obstetrics                            Anesthesia Physical Anesthesia Plan  ASA: III  Anesthesia Plan: MAC   Post-op Pain Management:    Induction:   PONV Risk Score and Plan: 3 and Ondansetron, Dexamethasone, Propofol infusion and Treatment may vary due to age or medical condition  Airway Management Planned: Simple Face Mask and Nasal Cannula  Additional Equipment: None  Intra-op Plan:   Post-operative Plan:   Informed Consent: I have reviewed the patients History and Physical, chart, labs and discussed the procedure including the risks, benefits and alternatives for the proposed anesthesia with the patient or authorized representative who has indicated his/her understanding and acceptance.     Dental advisory given  Plan Discussed with: CRNA and Surgeon  Anesthesia Plan Comments:        Anesthesia Quick Evaluation

## 2020-02-17 NOTE — Interval H&P Note (Signed)
History and Physical Interval Note:  02/17/2020 12:12 PM  Brenda [name]  has presented today for surgery, with the diagnosis of INTERSTITIAL CYSTITIS.  The various methods of treatment have been discussed with the patient and family. After consideration of risks, benefits and other options for treatment, the patient has consented to  Procedure(s) with comments: Reserve (N/A) - ONLY NEEDS 30 MIN as a surgical intervention.  The patient's history has been reviewed, patient examined, no change in status, stable for surgery.  I have reviewed the patient's chart and labs.  Questions were answered to the patient's satisfaction.     [name]

## 2020-02-17 NOTE — OR Nursing (Signed)
60cc or tetracaine hcl/neomycin sulfate administered by dr. Jeffie Pollock in [place]

## 2020-02-18 ENCOUNTER — Encounter (HOSPITAL_BASED_OUTPATIENT_CLINIC_OR_DEPARTMENT_OTHER): Payer: Self-pay | Admitting: Urology

## 2020-02-22 DIAGNOSIS — E785 Hyperlipidemia, unspecified: Secondary | ICD-10-CM | POA: Diagnosis not present

## 2020-02-23 DIAGNOSIS — I129 Hypertensive chronic kidney disease with stage 1 through stage 4 chronic kidney disease, or unspecified chronic kidney disease: Secondary | ICD-10-CM | POA: Diagnosis not present

## 2020-02-23 DIAGNOSIS — J4540 Moderate persistent asthma, uncomplicated: Secondary | ICD-10-CM | POA: Diagnosis not present

## 2020-02-23 DIAGNOSIS — N189 Chronic kidney disease, unspecified: Secondary | ICD-10-CM | POA: Diagnosis not present

## 2020-02-23 DIAGNOSIS — N3010 Interstitial cystitis (chronic) without hematuria: Secondary | ICD-10-CM | POA: Diagnosis not present

## 2020-02-25 DIAGNOSIS — L820 Inflamed seborrheic keratosis: Secondary | ICD-10-CM | POA: Diagnosis not present

## 2020-02-25 DIAGNOSIS — D2261 Melanocytic nevi of right upper limb, including shoulder: Secondary | ICD-10-CM | POA: Diagnosis not present

## 2020-02-25 DIAGNOSIS — D2262 Melanocytic nevi of left upper limb, including shoulder: Secondary | ICD-10-CM | POA: Diagnosis not present

## 2020-02-25 DIAGNOSIS — D225 Melanocytic nevi of trunk: Secondary | ICD-10-CM | POA: Diagnosis not present

## 2020-02-25 DIAGNOSIS — D2272 Melanocytic nevi of left lower limb, including hip: Secondary | ICD-10-CM | POA: Diagnosis not present

## 2020-02-25 DIAGNOSIS — D1801 Hemangioma of skin and subcutaneous tissue: Secondary | ICD-10-CM | POA: Diagnosis not present

## 2020-02-25 DIAGNOSIS — L821 Other seborrheic keratosis: Secondary | ICD-10-CM | POA: Diagnosis not present

## 2020-02-25 DIAGNOSIS — D2271 Melanocytic nevi of right lower limb, including hip: Secondary | ICD-10-CM | POA: Diagnosis not present

## 2020-03-03 DIAGNOSIS — N281 Cyst of kidney, acquired: Secondary | ICD-10-CM | POA: Diagnosis not present

## 2020-03-07 DIAGNOSIS — Z961 Presence of intraocular lens: Secondary | ICD-10-CM | POA: Diagnosis not present

## 2020-03-07 DIAGNOSIS — H524 Presbyopia: Secondary | ICD-10-CM | POA: Diagnosis not present

## 2020-03-07 DIAGNOSIS — H04123 Dry eye syndrome of bilateral lacrimal glands: Secondary | ICD-10-CM | POA: Diagnosis not present

## 2020-03-07 DIAGNOSIS — H43813 Vitreous degeneration, bilateral: Secondary | ICD-10-CM | POA: Diagnosis not present

## 2020-03-10 DIAGNOSIS — Z01419 Encounter for gynecological examination (general) (routine) without abnormal findings: Secondary | ICD-10-CM | POA: Diagnosis not present

## 2020-03-10 DIAGNOSIS — N941 Unspecified dyspareunia: Secondary | ICD-10-CM | POA: Diagnosis not present

## 2020-03-10 DIAGNOSIS — Z6828 Body mass index (BMI) 28.0-28.9, adult: Secondary | ICD-10-CM | POA: Diagnosis not present

## 2020-03-14 ENCOUNTER — Ambulatory Visit
Admission: RE | Admit: 2020-03-14 | Discharge: 2020-03-14 | Disposition: A | Discharge status: Home / Self Care | Payer: PPO | Source: Ambulatory Visit | Attending: Internal Medicine | Admitting: Internal Medicine

## 2020-03-14 ENCOUNTER — Other Ambulatory Visit: Payer: Self-pay | Admitting: Internal Medicine

## 2020-03-14 ENCOUNTER — Other Ambulatory Visit: Payer: Self-pay

## 2020-03-14 ENCOUNTER — Ambulatory Visit (INDEPENDENT_AMBULATORY_CARE_PROVIDER_SITE_OTHER): Payer: PPO | Admitting: Psychiatry

## 2020-03-14 ENCOUNTER — Encounter: Payer: Self-pay | Admitting: Psychiatry

## 2020-03-14 DIAGNOSIS — F330 Major depressive disorder, recurrent, mild: Secondary | ICD-10-CM | POA: Diagnosis not present

## 2020-03-14 DIAGNOSIS — F411 Generalized anxiety disorder: Secondary | ICD-10-CM | POA: Diagnosis not present

## 2020-03-14 DIAGNOSIS — F5105 Insomnia due to other mental disorder: Secondary | ICD-10-CM

## 2020-03-14 DIAGNOSIS — N63 Unspecified lump in unspecified breast: Secondary | ICD-10-CM

## 2020-03-14 DIAGNOSIS — F4001 Agoraphobia with panic disorder: Secondary | ICD-10-CM

## 2020-03-14 DIAGNOSIS — R922 Inconclusive mammogram: Secondary | ICD-10-CM | POA: Diagnosis not present

## 2020-03-14 DIAGNOSIS — N641 Fat necrosis of breast: Secondary | ICD-10-CM | POA: Diagnosis not present

## 2020-03-14 MED ORDER — FLUOXETINE HCL 10 MG PO CAPS: 10.0000 mg | ORAL_CAPSULE | Freq: Every day | ORAL | 3 refills | Status: DC

## 2020-03-14 MED ORDER — ZOLPIDEM TARTRATE 5 MG PO TABS: 5.0000 mg | ORAL_TABLET | Freq: Every evening | ORAL | 1 refills | Status: DC | PRN

## 2020-03-14 NOTE — Progress Notes (Signed)
KYRIA [name] [id number] 1951/01/29 69 y.o.  Subjective:   Patient ID:  Brenda [name] is a 69 y.o. (DOB [birth year]) female.  Chief Complaint:  Chief Complaint  Patient presents with  . Follow-up  . Depression  . Anxiety    Depression        Associated symptoms include myalgias.  Associated symptoms include no decreased concentration and no suicidal ideas.  Past medical history includes anxiety.   Anxiety Symptoms include nervous/anxious behavior. Patient reports no confusion, decreased concentration, shortness of breath or suicidal ideas.     Brenda [name] presents to the office today for follow-up of anxiety and poor sleep.   seen August 2020.  No meds were changed.  06/09/19 appt without med changes and following noted: Bad dreams still hot flashes.  Goes to bed thinking of worries.  Sleep not great and not terrib le.  Wants occ Ambien. Doesn't sleep as well if can't take Ambien at times.  No amnesia.  Dreams sometimes awaken her.  Hot flashes awaken her nightly and has them daytime too.  Occ panic out of sleep. Depressed with no family left and no kids.    Retired but works 2 days/week.  Depressed some at other times of the year but worse at holidays per usual.  . Still works about 2 days a week.  Better energy overall with less work.  Eats out a lot still but little else. H history lymphoma and when sick it's hard to shake.  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  Sold [employer] home place. Trying to exercise more and needs to lose weight. Generally takes 1 mg Ativan in am only.  Wonders about 2nd dose.  03/14/2020 appointment with the following noted: Tried increasing fluoxetine to 15 mg for 2 weeks and got diarrhea so cut back to 10 mg daily. depression and anxiety are unchanged. Sister calls and wants her to take her places since she's partially retired.  Sister has 2 kids and wants to depend on her without reason.  Sister is overweight and on a walker.  Has said something  to nephews and neices about her sister's dependence on pt.Sister didn't help with pt's parents.  Situation makes her anxious. Plan to retire end of the year but may have to work middle of next year.  Patient reports stable mood and denies depressed or irritable moods.  Typical levels of anxiety unchanged.  Wanted to travel with retirement and Covid interferes.   Patient chronic difficulty with sleep initiation or maintenance with occ Ambien.  Might give her a little headache. Still some anxiety dreams.  Thinks she needs 2 Ativan daily but takes 1.. Denies appetite disturbance.  Patient reports that energy and motivation have been good.  Patient denies any difficulty with concentration.  Patient denies any suicidal ideation.  She is had multiple med intolerances including venlafaxine which caused vision problems and mental fogginess, Trintellix, duloxetine was blurred vision, paroxetine, Viibryd GI, buspirone, sertraline, fluoxetine, and Lexapro.   She has a history of side effects from trazodone, gabapentin, Valium which caused nightmares, Tranxene which caused tachycardia, lorazepam, zolpidem. All these meds that she did not tolerate we tried low dosages.  Review of Systems:  Review of Systems  Respiratory: Negative for shortness of breath.   Gastrointestinal: Negative for diarrhea.  Musculoskeletal: Positive for arthralgias, back pain, myalgias and neck pain.  Neurological: Negative for tremors and weakness.  Psychiatric/Behavioral: Positive for depression, dysphoric mood and sleep disturbance. Negative for agitation, behavioral problems,  confusion, decreased concentration, hallucinations, self-injury and suicidal ideas. The patient is nervous/anxious. The patient is not hyperactive.     Medications: I have reviewed the patient's current medications.  Current Outpatient Medications  Medication Sig Dispense Refill  . acetaminophen (TYLENOL) 325 MG tablet Take 500 mg by mouth every 6 (six)  hours as needed (for headaches).     Marland Kitchen albuterol (VENTOLIN HFA) 108 (90 Base) MCG/ACT inhaler Inhale into the lungs every 6 (six) hours as needed for wheezing or shortness of breath.    . chlorpheniramine (CHLOR-TRIMETON) 4 MG tablet Take 4 mg by mouth 2 (two) times daily as needed for allergies.    Marland Kitchen ezetimibe (ZETIA) 10 MG tablet Take 10 mg by mouth every evening.     . famotidine (PEPCID) 20 MG tablet Take 20 mg by mouth at bedtime.    . fluticasone (FLONASE) 50 MCG/ACT nasal spray Place 2 sprays into both nostrils daily.    . folic acid (FOLVITE) 1 MG tablet Take 1 mg by mouth daily.    Marland Kitchen LORazepam (ATIVAN) 1 MG tablet TAKE 1 TABLET EVERY 8 HOURS AS NEEDED FOR ANXIETY. 90 tablet 2  . losartan (COZAAR) 50 MG tablet Take 50 mg by mouth daily.    . metoprolol (LOPRESSOR) 50 MG tablet Take 25 mg by mouth 2 (two) times daily.     . Multiple Vitamins-Minerals (HAIR SKIN AND NAILS FORMULA PO) Take by mouth as needed.    . Omega-3 Fatty Acids (OMEGA-3 FISH OIL PO) Take by mouth. Takes one daily does not take every day    . OVER THE COUNTER MEDICATION Brain and memory power boost - takes only 1 but does not take every day    . pantoprazole (PROTONIX) 40 MG tablet 40 mg daily.     . Probiotic Product (PROBIOTIC PO) Take 1 capsule by mouth every evening.     Marland Kitchen REPATHA SURECLICK 283 MG/ML SOAJ Inject SQ into the abdomen every 2 weeks Pt reports taking every 2 weeks on 02/10/20    . vitamin B-12 (CYANOCOBALAMIN) 1000 MCG tablet Take 1,000 mcg by mouth daily.    . Vitamin D, Ergocalciferol, (DRISDOL) 50000 UNITS CAPS Take 50,000 Units by mouth every 7 (seven) days.      Marland Kitchen zolpidem (AMBIEN) 5 MG tablet Take 1 tablet (5 mg total) by mouth at bedtime as needed for sleep. 30 tablet 1  . FLUoxetine (PROZAC) 10 MG capsule Take 1 capsule (10 mg total) by mouth daily. 90 capsule 3   No current facility-administered medications for this visit.    Medication Side Effects: Other: vivid dreams  Allergies:   Allergies  Allergen Reactions  . Codeine Other (See Comments)    SEVERE ELEVATED LIVER ENZYMES  . Latex Rash  . Stadol [Butorphanol Tartrate] Swelling    Lips numb, tongue swell, tachycardia  . Adhesive [Tape] Other (See Comments)    TEARS SKIN; PLEASE USE COBAN WRAP!!  . Bupivacaine Other (See Comments)    Other Reaction: PRE-SYNCOPE, TACTYCARDIA TACHYCARDIA/ PRESYNCOPE  . Butorphanol Tartrate Other (See Comments)    NUMBNESS, TACHYCARDIA  . Clarithromycin Nausea Only and Other (See Comments)    TACHYCARDIA  . Cymbalta [Duloxetine Hcl] Other (See Comments)    Blurred vision  . Dilaudid [Hydromorphone Hcl] Itching    SEVERE ITCHING  . Elavil [Amitriptyline Hcl] Other (See Comments)    Elevated liver enzymes  . Hydrocodone Other (See Comments)    Elevated liver enzymes  . Hyoscyamine Sulfate Other (See Comments)  Dizziness, nausea, headache  . Levbid [Hyoscyamine Sulfate] Other (See Comments)    Dizziness, nausea, headache  . Lisinopril Cough    Dry cough  . Marcaine [Bupivacaine Hcl] Other (See Comments)    TACHYCARDIA, PRESYNCOPE  . Pentazocine Lactate Other (See Comments)    Tachycardia   . Percodan [Oxycodone-Aspirin] Nausea Only  . Trazodone And Nefazodone Other (See Comments)    Tachycardia   . Vistaril [Hydroxyzine Hcl] Other (See Comments)    Altered sensorium  . Morphine And Related Itching and Rash    Elevate liver enzymes    Past Medical History:  Diagnosis Date  . Anxiety   . Arthritis   . Bladder pain   . Cervical disc disease    THINKS C 4 TO C 5  . Chronic cough    followed by dr wert and ENT dr Carol Ada  . Chronic kidney disease    stage IIIA  . Chronic seasonal allergic rhinitis   . Depression   . Dyspnea    with exertion   . Fibromyalgia   . Frequency of urination   . GERD (gastroesophageal reflux disease)   . Heart murmur    per pt pcp only hear's murmur occasionally, asymptomatic  . History of pancreatitis 1988-1989  .  History of panic attacks   . Hyperlipidemia   . Hypertension LABILE   followed by pcp  . IBS (irritable bowel syndrome)   . Interstitial cystitis    urologist-- dr Jeffie Pollock  . Lumbar spondylosis   . Migraines   . Moderate persistent asthma    followed by pcp and dr wert   . Multiple pulmonary nodules    pulmonologist-- dr wert  . Normal cardiac stress test JULY 2009---  NORMAL  . PONV (postoperative nausea and vomiting)   . 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 Spasm of sphincter of Oddi 1987   02-13-2019  per pt no issues since she watches what medication's she takes  . Stroke [facility])    hx of 2 strokes in past- years ago - found mri   . Urethral stenosis S/P DILATIONS  . Urgency of urination     Family History  Problem Relation Age of Onset  . Colon cancer Maternal Grandmother   . Colon cancer Cousin   . Allergies Mother   . Asthma Mother   . Heart disease Mother   . Osteoarthritis Mother   . Diabetes Mother   . Irritable bowel syndrome Mother   . Brain cancer Father   . Osteoarthritis Father   . Colon polyps Father        adenomatous  . Heart disease Father   . Melanoma Sister   . Osteoarthritis Sister   . Irritable bowel syndrome Sister   . Diabetes Sister   . Heart disease Sister   . Stroke Sister   . Ulcerative colitis Maternal Uncle   . Stomach cancer Neg Hx   . Rheumatologic disease Neg Hx     Social History   Socioeconomic History  . Marital status: Married    Spouse name: Not on file  . Number of children: 0  . Years of education: 34  . Highest education level: Not on file  Occupational History  . Occupation: Therapist, sports  Tobacco Use  . Smoking status: Never Smoker  . Smokeless tobacco: Never Used  Vaping Use  . Vaping  Use: Never used  Substance and Sexual Activity  . Alcohol use: No  . Drug use: No  . Sexual activity: Not on file  Other Topics Concern  . Not on file  Social  History Narrative   Married, lives with spouse   No children   RN at [employer]   No recent travel      [facility]:   Originally from [place]. Always lived in [place]. Previously has traveled to [place], [place], [place], [place], & mostly [place]. No indoor pets currently. She does have cats in her garage. No bird, mold, or hot tub exposure. No indoor plants. Mostly wood floors. She did have her bedroom carpet taken up in Summer 2017. Has mostly blinds. No wood burning fire place. Previously enjoyed Firefighter & playing piano.    Social Determinants of Health   Financial Resource Strain:   . Difficulty of Paying Living Expenses: Not on file  Food Insecurity:   . Worried About Charity fundraiser in the Last Year: Not on file  . Ran Out of Food in the Last Year: Not on file  Transportation Needs:   . Lack of Transportation (Medical): Not on file  . Lack of Transportation (Non-Medical): Not on file  Physical Activity:   . Days of Exercise per Week: Not on file  . Minutes of Exercise per Session: Not on file  Stress:   . Feeling of Stress : Not on file  Social Connections:   . Frequency of Communication with Friends and Family: Not on file  . Frequency of Social Gatherings with Friends and Family: Not on file  . Attends Religious Services: Not on file  . Active Member of Clubs or Organizations: Not on file  . Attends Archivist Meetings: Not on file  . Marital Status: Not on file  Intimate Partner Violence:   . Fear of Current or Ex-Partner: Not on file  . Emotionally Abused: Not on file  . Physically Abused: Not on file  . Sexually Abused: Not on file    Past Medical History, Surgical history, Social history, and Family history were reviewed and updated as appropriate.   Please see review of systems for further details on the patient's review from today.   Objective:   Physical Exam:  There were no vitals taken for this visit.  Physical  Exam Constitutional:      General: She is not in acute distress.    Appearance: She is well-developed.  Musculoskeletal:        General: No deformity.  Neurological:     Mental Status: She is alert and oriented to person, place, and time.     Motor: No tremor.     Coordination: Coordination normal.     Gait: Gait normal.  Psychiatric:        Attention and Perception: Attention and perception normal.        Mood and Affect: Mood is anxious and depressed. Affect is not labile, blunt, angry or inappropriate.        Speech: Speech normal. Speech is not slurred.        Behavior: Behavior normal.        Thought Content: Thought content normal. Thought content is not paranoid. Thought content does not include homicidal or suicidal ideation. Thought content does not include homicidal or suicidal plan.        Cognition and Memory: Cognition normal.        Judgment: Judgment normal.  Comments: Insight and judgment fair to good. No auditory or visual hallucinations. No delusions.  Talkative per usual.  Pleasant.     Lab Review:     Component Value Date/Time   NA 142 02/17/2020 1026   K 4.4 02/17/2020 1026   CL 104 02/17/2020 1026   CO2 23 12/04/2016 1351   GLUCOSE 94 02/17/2020 1026   BUN 15 02/17/2020 1026   CREATININE 1.20 (H) 02/17/2020 1026   CALCIUM 9.6 12/04/2016 1351   GFRNONAA >60 12/04/2016 1351   GFRAA >60 12/04/2016 1351       Component Value Date/Time   WBC 7.7 08/12/2017 1211   RBC 4.34 08/12/2017 1211   HGB 15.3 (H) 02/17/2020 1026   HCT 45.0 02/17/2020 1026   PLT 327.0 08/12/2017 1211   MCV 93.1 08/12/2017 1211   MCH 31.6 12/04/2016 1351   MCHC 34.0 08/12/2017 1211   RDW 13.7 08/12/2017 1211   LYMPHSABS 1.9 08/12/2017 1211   MONOABS 0.7 08/12/2017 1211   EOSABS 0.1 08/12/2017 1211   BASOSABS 0.1 08/12/2017 1211    No results found for: POCLITH, LITHIUM   No results found for: PHENYTOIN, PHENOBARB, VALPROATE, CBMZ   Completed [facility]  testing  .res Assessment: Plan:    Marquelle was seen today for follow-up, depression and anxiety.  Diagnoses and all orders for this visit:  Mild recurrent major depression (HCC) -     FLUoxetine (PROZAC) 10 MG capsule; Take 1 capsule (10 mg total) by mouth daily.  Panic disorder with agoraphobia -     FLUoxetine (PROZAC) 10 MG capsule; Take 1 capsule (10 mg total) by mouth daily.  Generalized anxiety disorder -     FLUoxetine (PROZAC) 10 MG capsule; Take 1 capsule (10 mg total) by mouth daily.  Insomnia due to mental condition -     zolpidem (AMBIEN) 5 MG tablet; Take 1 tablet (5 mg total) by mouth at bedtime as needed for sleep.  Med Sensitivity complicates treatment  Greater than 50% of 30 min face to face time with patient was spent on counseling and coordination of care. We discussed Chronic underlying anxiety.  Multiple med failures and intolerances have limited overall effectiveness.  Very medication sensitive. Guarded prognosis for further improvement.  Frustrated with weight gain and blames meds.  Chronic anxiety.   Continues consistently fluoxetine 10 mg daily.  She's aware this is a low dosage.  Didn't tolerate higher doses and poor tolerance of change but benefit.  Option retry duloxetine.  Not available as liquid.  It helped FM.    Cont Prozac per her request.  Option increase.  Ok prn ambien.  Disc amnesia.  Disc negative effect of food on efficacy and how to deal with it.  Ok. Ativan prn. Option BID dosing if needed. Risk benefit ration discussed.  We discussed the short-term risks associated with benzodiazepines including sedation and increased fall risk among others.  Discussed long-term side effect risk including dependence, potential withdrawal symptoms, and the potential eventual dose-related risk of dementia.  New studies refute this assn.   Disc worries over CKD stage 3.  Disc change in definition.  Sleep irregular DT work 2 days per week.  Sleep hygiene  discussed and sleep meds may not resolve this bc she swings her schedule back and forth.  Supportive therapy dealing with Covid and lack of availability to get out of the house.  This appt was 30 mins.  FU 6-9 months unless   Lynder Parents, MD, DFAPA'  Future Appointments  Date Time Provider [place]  03/14/2020  1:10 PM [facility]    No orders of the defined types were placed in this encounter.     -------------------------------

## 2020-03-18 DIAGNOSIS — N3010 Interstitial cystitis (chronic) without hematuria: Secondary | ICD-10-CM | POA: Diagnosis not present

## 2020-03-27 ENCOUNTER — Other Ambulatory Visit: Payer: Self-pay

## 2020-03-27 ENCOUNTER — Emergency Department (HOSPITAL_COMMUNITY)
Admission: EM | Admit: 2020-03-27 | Discharge: 2020-03-27 | Disposition: A | Discharge status: Home / Self Care | Payer: PPO | Attending: Emergency Medicine | Admitting: Emergency Medicine

## 2020-03-27 ENCOUNTER — Emergency Department (HOSPITAL_COMMUNITY): Payer: PPO

## 2020-03-27 ENCOUNTER — Encounter (HOSPITAL_COMMUNITY): Payer: Self-pay | Admitting: Emergency Medicine

## 2020-03-27 DIAGNOSIS — H5712 Ocular pain, left eye: Secondary | ICD-10-CM | POA: Diagnosis not present

## 2020-03-27 DIAGNOSIS — H0014 Chalazion left upper eyelid: Secondary | ICD-10-CM | POA: Diagnosis not present

## 2020-03-27 DIAGNOSIS — I129 Hypertensive chronic kidney disease with stage 1 through stage 4 chronic kidney disease, or unspecified chronic kidney disease: Secondary | ICD-10-CM | POA: Diagnosis not present

## 2020-03-27 DIAGNOSIS — R1030 Lower abdominal pain, unspecified: Secondary | ICD-10-CM | POA: Insufficient documentation

## 2020-03-27 DIAGNOSIS — N183 Chronic kidney disease, stage 3 unspecified: Secondary | ICD-10-CM | POA: Insufficient documentation

## 2020-03-27 DIAGNOSIS — Z91040 Latex allergy status: Secondary | ICD-10-CM | POA: Insufficient documentation

## 2020-03-27 DIAGNOSIS — H2102 Hyphema, left eye: Secondary | ICD-10-CM | POA: Insufficient documentation

## 2020-03-27 DIAGNOSIS — M25511 Pain in right shoulder: Secondary | ICD-10-CM | POA: Diagnosis not present

## 2020-03-27 DIAGNOSIS — R0789 Other chest pain: Secondary | ICD-10-CM | POA: Diagnosis not present

## 2020-03-27 DIAGNOSIS — S0512XA Contusion of eyeball and orbital tissues, left eye, initial encounter: Secondary | ICD-10-CM | POA: Diagnosis not present

## 2020-03-27 DIAGNOSIS — S20211A Contusion of right front wall of thorax, initial encounter: Secondary | ICD-10-CM | POA: Insufficient documentation

## 2020-03-27 DIAGNOSIS — J4540 Moderate persistent asthma, uncomplicated: Secondary | ICD-10-CM | POA: Insufficient documentation

## 2020-03-27 DIAGNOSIS — H02402 Unspecified ptosis of left eyelid: Secondary | ICD-10-CM | POA: Diagnosis not present

## 2020-03-27 DIAGNOSIS — S299XXA Unspecified injury of thorax, initial encounter: Secondary | ICD-10-CM | POA: Diagnosis present

## 2020-03-27 MED ORDER — DOXYCYCLINE HYCLATE 100 MG PO TABS
100.0000 mg | ORAL_TABLET | Freq: Once | ORAL | Status: AC
  Administered 2020-03-27: 100 mg via ORAL
  Filled 2020-03-27: qty 1

## 2020-03-27 MED ORDER — MEPERIDINE HCL 25 MG/ML IJ SOLN
25.0000 mg | Freq: Once | INTRAMUSCULAR | Status: DC
  Filled 2020-03-27: qty 1

## 2020-03-27 MED ORDER — MEPERIDINE HCL 25 MG/ML IJ SOLN
25.0000 mg | Freq: Once | INTRAMUSCULAR | Status: AC
  Administered 2020-03-27: 25 mg via INTRAMUSCULAR

## 2020-03-27 MED ORDER — DICLOFENAC SODIUM 1 % EX GEL: 2.0000 g | Freq: Four times a day (QID) | CUTANEOUS | 0 refills | Status: AC

## 2020-03-27 NOTE — ED Notes (Signed)
Pt to radiology via stretcher.  

## 2020-03-27 NOTE — Discharge Instructions (Addendum)
I recommend that you apply Voltaren gel to the affected areas.  I also encourage you to obtain a heating pad which you can place around her neck or on the areas of discomfort.  This is good for muscle pain.  I also encourage you to consider a Epsom salt bath.  Please take your Flexeril that you already have at home, as directed.  Do not take if you plan to work or drive as it can make you drowsy.  Please follow-up with your primary care provider regarding today's encounter and for ongoing evaluation and management.  Please pick up the doxycycline already prescribed for your hordeolum.  Take with food.  You were given narcotic and or sedative medications while in the emergency department. Do not drive. Do not use machinery or power tools. Do not sign legal documents. Do not drink alcohol. Do not take sleeping pills. Do not supervise children by yourself. Do not participate in activities that require climbing or being in high places.

## 2020-03-27 NOTE — ED Notes (Addendum)
Awaiting pharmacy to bring Demerol dose for patient. Message sent.
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
## 2020-03-27 NOTE — ED Notes (Signed)
ED Provider at bedside. 
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
## 2020-03-27 NOTE — ED Notes (Signed)
Patient ambulated to restroom and to husband's ED room to visit. Gait steady. Respirations even and unlabored. NAD noted w/ ambulation.

## 2020-03-30 ENCOUNTER — Telehealth: Payer: Self-pay

## 2020-03-30 NOTE — Telephone Encounter (Signed)
03/29/20- call to pt per Dr. Marla Roe to inform her of the results of her right breast mammogram/ultrasound. Pt is aware that the firm/tender area she has on the upper/outer quadrant of right breast is (per report)- fat necrosis/post surgical scar tissue. She does still have tenderness in this area & can palpate the firm mass.  She denies any other complication, she does not have any chills/fever & no drainage noted. Pt has a f/u appointment with Dr. Marla Roe on 04/26/20 & she will keep this appointment -I did direct her to call for any other or more immediate concerns. Pt agrees with plan of care.

## 2020-03-31 DIAGNOSIS — H0014 Chalazion left upper eyelid: Secondary | ICD-10-CM | POA: Diagnosis not present

## 2020-03-31 DIAGNOSIS — H01004 Unspecified blepharitis left upper eyelid: Secondary | ICD-10-CM | POA: Diagnosis not present

## 2020-04-15 DIAGNOSIS — N3010 Interstitial cystitis (chronic) without hematuria: Secondary | ICD-10-CM | POA: Diagnosis not present

## 2020-04-20 ENCOUNTER — Ambulatory Visit (INDEPENDENT_AMBULATORY_CARE_PROVIDER_SITE_OTHER): Payer: PPO

## 2020-04-20 ENCOUNTER — Encounter: Payer: Self-pay | Admitting: Orthopaedic Surgery

## 2020-04-20 ENCOUNTER — Ambulatory Visit: Payer: PPO | Admitting: Orthopaedic Surgery

## 2020-04-20 ENCOUNTER — Other Ambulatory Visit: Payer: Self-pay

## 2020-04-20 VITALS — Ht 60.0 in | Wt 143.0 lb

## 2020-04-20 DIAGNOSIS — M25511 Pain in right shoulder: Secondary | ICD-10-CM

## 2020-04-20 MED ORDER — BUPIVACAINE HCL 0.5 % IJ SOLN
2.0000 mL | INTRAMUSCULAR | Status: AC | PRN
  Administered 2020-04-20: 2 mL via INTRA_ARTICULAR

## 2020-04-20 MED ORDER — LIDOCAINE HCL 2 % IJ SOLN
2.0000 mL | INTRAMUSCULAR | Status: AC | PRN
  Administered 2020-04-20: 2 mL
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
## 2020-04-26 ENCOUNTER — Ambulatory Visit: Payer: PPO | Admitting: Plastic Surgery

## 2020-05-08 ENCOUNTER — Other Ambulatory Visit: Payer: Self-pay | Admitting: Psychiatry

## 2020-05-08 DIAGNOSIS — F5105 Insomnia due to other mental disorder: Secondary | ICD-10-CM

## 2020-05-09 DIAGNOSIS — H0014 Chalazion left upper eyelid: Secondary | ICD-10-CM | POA: Diagnosis not present

## 2020-05-09 DIAGNOSIS — H0100B Unspecified blepharitis left eye, upper and lower eyelids: Secondary | ICD-10-CM | POA: Diagnosis not present

## 2020-05-10 ENCOUNTER — Ambulatory Visit: Payer: PPO | Admitting: Plastic Surgery

## 2020-05-10 ENCOUNTER — Encounter: Payer: Self-pay | Admitting: Plastic Surgery

## 2020-05-10 ENCOUNTER — Other Ambulatory Visit: Payer: Self-pay

## 2020-05-10 VITALS — BP 126/86 | HR 67

## 2020-05-10 DIAGNOSIS — Z9889 Other specified postprocedural states: Secondary | ICD-10-CM

## 2020-05-10 NOTE — Progress Notes (Signed)
   Subjective:    Patient ID: Brenda [name], female    DOB: Jan 08, 1951, 70 y.o.   MRN: [id number]  Patient is a 70 year old female here for evaluation of her breasts.  She underwent bilateral breast reductions 6/19.  She then had revisions for fat necrosis 11/20.  She showed really nice improvement for the majority of the area.  She now has concerns about some firmness and fat necrosis on the right lateral breast.  And some excess tissue on the breast area bilaterally.  Her mammogram is up-to-date.  There are no signs of infection.  The area of fat necrosis on the right breast is causing concern from a mammogram standpoint.    Review of Systems  Constitutional: Negative.  Negative for activity change.  HENT: Negative.   Eyes: Negative.   Respiratory: Negative.  Negative for shortness of breath.   Cardiovascular: Negative.   Gastrointestinal: Negative.   Genitourinary: Negative.   Musculoskeletal: Negative.   Hematological: Negative.   Psychiatric/Behavioral: Negative.        Objective:   Physical Exam Vitals and nursing note reviewed.  HENT:     Head: Normocephalic and atraumatic.  Cardiovascular:     Rate and Rhythm: Normal rate.     Pulses: Normal pulses.  Pulmonary:     Effort: Pulmonary effort is normal.  Abdominal:     General: Abdomen is flat. There is no distension.  Neurological:     Mental Status: She is alert. Mental status is at baseline.  Psychiatric:        Mood and Affect: Mood normal.        Behavior: Behavior normal.         Assessment & Plan:     ICD-10-CM   1. Status post bilateral breast reduction  Z98.890     Excision of fat necrosis right breast and left inferior breast.  Liposuction bilateral breasts laterally.  Pictures were obtained of the patient and placed in the chart with the patient's or guardian's permission.

## 2020-05-13 DIAGNOSIS — N3010 Interstitial cystitis (chronic) without hematuria: Secondary | ICD-10-CM | POA: Diagnosis not present

## 2020-06-10 ENCOUNTER — Encounter: Payer: Self-pay | Admitting: Urology

## 2020-06-10 DIAGNOSIS — N3010 Interstitial cystitis (chronic) without hematuria: Secondary | ICD-10-CM | POA: Diagnosis not present

## 2020-06-10 DIAGNOSIS — N281 Cyst of kidney, acquired: Secondary | ICD-10-CM | POA: Diagnosis not present

## 2020-06-15 ENCOUNTER — Encounter: Payer: Self-pay | Admitting: Surgical

## 2020-06-15 ENCOUNTER — Other Ambulatory Visit: Payer: Self-pay

## 2020-06-15 ENCOUNTER — Ambulatory Visit (INDEPENDENT_AMBULATORY_CARE_PROVIDER_SITE_OTHER): Payer: PPO | Admitting: Surgical

## 2020-06-15 VITALS — BP 148/85 | HR 71 | Ht 59.5 in | Wt 151.8 lb

## 2020-06-15 DIAGNOSIS — N62 Hypertrophy of breast: Secondary | ICD-10-CM

## 2020-06-15 DIAGNOSIS — Z9889 Other specified postprocedural states: Secondary | ICD-10-CM

## 2020-06-15 NOTE — H&P (View-Only) (Signed)
Patient ID: Brenda [name], female    DOB: 1950/10/07, 70 y.o.   MRN: [id number]  Chief Complaint  Patient presents with  . Pre-op Exam      ICD-10-CM   1. Status post bilateral breast reduction  Z98.890   2. Symptomatic mammary hypertrophy  N62     History of Present Illness: Brenda [name] is a 70 y.o.  female  with a history of bilateral breast reduction on 10/17/2018 with Dr. Marla Roe, she subsequently underwent revisions with fat necrosis on 02/16/2019.  She presents for preoperative evaluation for upcoming procedure, excision of right breast fat necrosis and left inferior breast fat necrosis and bilateral liposuction of lateral breasts, scheduled for 06/30/2020 with Dr. Marla Roe.  The patient has not had problems with anesthesia. No history of DVT/PE.  Mother with history of DVT,.  No other family history of DVT.  Her mother DVT was associated with an accident, was not spontaneous..  No family or personal history of bleeding or clotting disorders.  Patient is not currently taking any blood thinners.    Summary of Previous Visit: Patient underwent bilateral breast reductions and subsequent revisions of fat necrosis with Dr. Marla Roe.  She has some concerns about firmness of fat necrosis in the right lateral breast and some excess tissue on the breast areas bilaterally.  Her mammograms are up-to-date.  There is no sign of infection.  PMH Significant for: Hypertension, hyperlipidemia, asthma, systolic murmur, CVA with no residual deficits.  She has a history of fibromyalgia.   Patient reports she did well with previous anesthesia, she does have PON V, but has done well in the past with scopolamine patch.  Past Medical History: Allergies: Allergies  Allergen Reactions  . Codeine Other (See Comments)    SEVERE ELEVATED LIVER ENZYMES  . Latex Rash  . Stadol [Butorphanol Tartrate] Swelling    Lips numb, tongue swell, tachycardia  . Adhesive [Tape] Other (See Comments)     TEARS SKIN; PLEASE USE COBAN WRAP!!  . Bupivacaine Other (See Comments)    Other Reaction: PRE-SYNCOPE, TACTYCARDIA TACHYCARDIA/ PRESYNCOPE  . Butorphanol Tartrate Other (See Comments)    NUMBNESS, TACHYCARDIA  . Clarithromycin Nausea Only and Other (See Comments)    TACHYCARDIA  . Cymbalta [Duloxetine Hcl] Other (See Comments)    Blurred vision  . Dilaudid [Hydromorphone Hcl] Itching    SEVERE ITCHING  . Elavil [Amitriptyline Hcl] Other (See Comments)    Elevated liver enzymes  . Hydrocodone Other (See Comments)    Elevated liver enzymes  . Hyoscyamine Sulfate Other (See Comments)    Dizziness, nausea, headache  . Levbid [Hyoscyamine Sulfate] Other (See Comments)    Dizziness, nausea, headache  . Lisinopril Cough    Dry cough  . Marcaine [Bupivacaine Hcl] Other (See Comments)    TACHYCARDIA, PRESYNCOPE  . Pentazocine Lactate Other (See Comments)    Tachycardia   . Percodan [Oxycodone-Aspirin] Nausea Only  . Trazodone And Nefazodone Other (See Comments)    Tachycardia   . Vistaril [Hydroxyzine Hcl] Other (See Comments)    Altered sensorium  . Morphine And Related Itching and Rash    Elevate liver enzymes    Current Medications:  Current Outpatient Medications:  .  acetaminophen (TYLENOL) 325 MG tablet, Take 500 mg by mouth every 6 (six) hours as needed (for headaches). , Disp: , Rfl:  .  albuterol (VENTOLIN HFA) 108 (90 Base) MCG/ACT inhaler, Inhale into the lungs every 6 (six) hours as needed for wheezing or  shortness of breath., Disp: , Rfl:  .  chlorpheniramine (CHLOR-TRIMETON) 4 MG tablet, Take 4 mg by mouth 2 (two) times daily as needed for allergies., Disp: , Rfl:  .  ezetimibe (ZETIA) 10 MG tablet, Take 10 mg by mouth every evening., Disp: , Rfl:  .  famotidine (PEPCID) 20 MG tablet, Take 20 mg by mouth at bedtime., Disp: , Rfl:  .  FLUoxetine (PROZAC) 10 MG capsule, Take 1 capsule (10 mg total) by mouth daily., Disp: 90 capsule, Rfl: 3 .  fluticasone (FLONASE) 50  MCG/ACT nasal spray, Place 2 sprays into both nostrils daily., Disp: , Rfl:  .  folic acid (FOLVITE) 1 MG tablet, Take 1 mg by mouth daily., Disp: , Rfl:  .  LORazepam (ATIVAN) 1 MG tablet, TAKE 1 TABLET EVERY 8 HOURS AS NEEDED FOR ANXIETY., Disp: 90 tablet, Rfl: 2 .  losartan (COZAAR) 50 MG tablet, Take 50 mg by mouth daily., Disp: , Rfl:  .  metoprolol (LOPRESSOR) 50 MG tablet, Take 25 mg by mouth 2 (two) times daily., Disp: , Rfl:  .  Multiple Vitamins-Minerals (HAIR SKIN AND NAILS FORMULA PO), Take by mouth as needed., Disp: , Rfl:  .  Omega-3 Fatty Acids (OMEGA-3 FISH OIL PO), Take by mouth. Takes one daily does not take every day, Disp: , Rfl:  .  OVER THE COUNTER MEDICATION, Brain and memory power boost - takes only 1 but does not take every day, Disp: , Rfl:  .  pantoprazole (PROTONIX) 40 MG tablet, 40 mg daily. , Disp: , Rfl:  .  Probiotic Product (PROBIOTIC PO), Take 1 capsule by mouth every evening. , Disp: , Rfl:  .  REPATHA SURECLICK 681 MG/ML SOAJ, Inject SQ into the abdomen every 2 weeks Pt reports taking every 2 weeks on 02/10/20, Disp: , Rfl:  .  Vitamin D, Ergocalciferol, (DRISDOL) 50000 UNITS CAPS, Take 50,000 Units by mouth every 7 (seven) days., Disp: , Rfl:  .  zolpidem (AMBIEN) 5 MG tablet, TAKE 1 TABLET AT BEDTIME AS NEEDED FOR INSOMNIA., Disp: 30 tablet, Rfl: 5  Past Medical Problems: Past Medical History:  Diagnosis Date  . Anxiety   . Arthritis   . Bladder pain   . Cervical disc disease    THINKS C 4 TO C 5  . Chronic cough    followed by dr wert and ENT dr Carol Ada  . Chronic kidney disease    stage IIIA  . Chronic seasonal allergic rhinitis   . Depression   . Dyspnea    with exertion   . Fibromyalgia   . Frequency of urination   . GERD (gastroesophageal reflux disease)   . Heart murmur    per pt pcp only hear's murmur occasionally, asymptomatic  . History of pancreatitis 1988-1989  . History of panic attacks   . Hyperlipidemia   . Hypertension  LABILE   followed by pcp  . IBS (irritable bowel syndrome)   . Interstitial cystitis    urologist-- dr Jeffie Pollock  . Lumbar spondylosis   . Migraines   . Moderate persistent asthma    followed by pcp and dr wert   . Multiple pulmonary nodules    pulmonologist-- dr wert  . Normal cardiac stress test JULY 2009---  NORMAL  . PONV (postoperative nausea and vomiting)   . 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 Spasm of sphincter  of Oddi 1987   02-13-2019  per pt no issues since she watches what medication's she takes  . Stroke [facility])    hx of 2 strokes in past- years ago - found mri   . Urethral stenosis S/P DILATIONS  . Urgency of urination     Past Surgical History: Past Surgical History:  Procedure Laterality Date  . ABDOMINAL HYSTERECTOMY  1988   COMPLETE  . APPENDECTOMY  04/ 1986  . bilateral reduction mastopexy  1988  . bilateral turbinate resection  1990   nasal spreading graft  . BREAST CYST EXCISION Bilateral 03/18/2019   Procedure: excision necrotic breast tissue left ;  Surgeon: Wallace Going, DO;  Location: [place];  Service: Plastics;  Laterality: Bilateral;  . BREAST REDUCTION SURGERY  10/17/2018   Procedure: bilateral breast reduction;  Surgeon: Wallace Going, DO;  Location: [place];  Service: Plastics;;  please adjust time to reflect 4 hours  . CATARACT EXTRACTION W/ INTRAOCULAR LENS  IMPLANT, BILATERAL  08 and 09/ 2017  . CHOLECYSTECTOMY  04/ 1986  . COLONOSCOPY  02/2004, 11/2010, LAST 03-2016 ENDO DONE ALSO   2005: Normal - Dr. Sammuel Cooper 2012: Normal  . CYSTO WITH HYDRODISTENSION N/A 12/17/2013   Procedure: CYSTO HYDRODISTENSION WITH INSTALLATION OF CHLOROPACTIN AND TETRACAINE;  Surgeon: Malka So, MD;  Location: [facility];  Service: Urology;  Laterality: N/A;  . CYSTO WITH HYDRODISTENSION N/A 06/02/2015   Procedure:  CYSTOSCOPY/HYDRODISTENSION WITH INSTILLATION OF CLORPACTIN ;  Surgeon: [name], MD;  Location: [facility];  Service: Urology;  Laterality: N/A;  . CYSTO WITH HYDRODISTENSION N/A 11/08/2016   Procedure: CYSTOSCOPY/HYDRODISTENSION INSTILLATION OF CLORPACTIN;  Surgeon: [name], MD;  Location: [facility];  Service: Urology;  Laterality: N/A;  . CYSTO WITH HYDRODISTENSION N/A 03/06/2018   Procedure: CYSTOSCOPY/HYDRODISTENSION INSTILL CHLORPACTION;  Surgeon: [name], MD;  Location[facility];  Service: Urology;  Laterality: N/A;  . CYSTO WITH HYDRODISTENSION N/A 04/02/2019   Procedure: CYSTOSCOPY/HYDRODISTENSION INSTILL CHLORPACTIN;  Surgeon: [name], MD;  Location: [facility];  Service: Urology;  Laterality: N/A;  . CYSTO WITH HYDRODISTENSION N/A 02/17/2020   Procedure: CYSTOSCOPY/HYDRODISTENTION WITH INSTILLATION OF CHLORPACTIN;  Surgeon: [name], MD;  Location: [facility];  Service: Urology;  Laterality: N/A;  ONLY NEEDS 30 MIN  . CYSTOSCOPY WITH URETHRAL DILATATION  07/12/2011   Procedure: CYSTOSCOPY WITH URETHRAL DILATATION;  Surgeon: Malka So, MD;  Location: [facility];  Service: Urology;  Laterality: N/A;  HOD AND INSTILLATION OF CHLOROPACTIN C-ARM   . CYSTOSCOPY WITH URETHRAL DILATATION N/A 06/26/2012   Procedure: CYSTOSCOPY WITH URETHRAL DILATATION HYDRODISTENSION AND INSTILLATION OF CLORPACTION ;  Surgeon: Malka So, MD;  Location: [facility];  Service: Urology;  Laterality: N/A;  CYSTOSCOPY WITH URETHRAL DILATATION HYDRODISTENSION AND INSTILLATION OF CLORPACTION   . CYSTOSTOMY W/ BLADDER BIOPSY  1983  . DG BARIUM SWALLOW (Sturgeon HX)  03/2016  . DILATION AND CURETTAGE OF UTERUS  1987  . ERCP  1988   for pancreatitis with stents   S/P SEVERE PANCREATITIS  . HEMORRHOID SURGERY    . LASIK Bilateral APRIL 2005  . LIPOSUCTION Bilateral 10/17/2018   Procedure:  LIPOSUCTION;  Surgeon: Wallace Going, DO;  Location: [place];  Service: Plastics;  Laterality: Bilateral;  . LIPOSUCTION Right 03/18/2019   Procedure: excision right necrotic breast tissue with liposuction;  Surgeon: Wallace Going, DO;  Location: [place];  Service: Plastics;  Laterality: Right;  . MULTIPLE CYSTO/ HOD/ URETHRAL DILATION/ INSTILLATION CLORPACTIN  .LAST ONE 03-31-2010  . NASAL SINUS SURGERY  1977   sinus cyst  . REDUCTION MAMMAPLASTY Bilateral 1988  . REDUCTION MAMMAPLASTY Bilateral 09/2018  . REDUCTION MAMMAPLASTY Bilateral 03/2019  . revision rhinoplasty  1978  . RHINOPLASTY  1977  . RIGHT SALPINGOOPHECTOMY  1998  . TONSILLECTOMY AND ADENOIDECTOMY  1963  . VIDEO BRONCHOSCOPY Bilateral 08/23/2016   Procedure: VIDEO BRONCHOSCOPY WITHOUT FLUORO;  Surgeon: Javier Glazier, MD;  Location: [facility];  Service: Cardiopulmonary;  Laterality: Bilateral;    Social History: Social History   Socioeconomic History  . Marital status: Married    Spouse name: Not on file  . Number of children: 0  . Years of education: 41  . Highest education level: Not on file  Occupational History  . Occupation: Therapist, sports  Tobacco Use  . Smoking status: Never Smoker  . Smokeless tobacco: Never Used  Vaping Use  . Vaping Use: Never used  Substance and Sexual Activity  . Alcohol use: No  . Drug use: No  . Sexual activity: Not on file  Other Topics Concern  . Not on file  Social History Narrative   Married, lives with spouse   No children   RN at [employer]   No recent travel      [facility]:   Originally from [place]. Always lived in [place]. Previously has traveled to [place], [place], [place], [place], & mostly [place]. No indoor pets currently. She does have cats in her garage. No bird, mold, or hot tub exposure. No indoor plants. Mostly wood floors. She did have her bedroom carpet taken up in Summer 2017. Has mostly blinds. No  wood burning fire place. Previously enjoyed Firefighter & playing piano.    Social Determinants of Health   Financial Resource Strain: Not on file  Food Insecurity: Not on file  Transportation Needs: Not on file  Physical Activity: Not on file  Stress: Not on file  Social Connections: Not on file  Intimate Partner Violence: Not on file    Family History: Family History  Problem Relation Age of Onset  . Colon cancer Maternal Grandmother   . Colon cancer Cousin   . Allergies Mother   . Asthma Mother   . Heart disease Mother   . Osteoarthritis Mother   . Diabetes Mother   . Irritable bowel syndrome Mother   . Brain cancer Father   . Osteoarthritis Father   . Colon polyps Father        adenomatous  . Heart disease Father   . Melanoma Sister   . Osteoarthritis Sister   . Irritable bowel syndrome Sister   . Diabetes Sister   . Heart disease Sister   . Stroke Sister   . Ulcerative colitis Maternal Uncle   . Stomach cancer Neg Hx   . Rheumatologic disease Neg Hx     Review of Systems: Review of Systems  Constitutional: Negative.   Respiratory: Negative.   Cardiovascular: Negative.   Gastrointestinal: Negative.   Genitourinary: Negative.   Neurological: Negative.    Physical Exam: Vital Signs BP (!) 148/85 (BP Location: Left Arm, Patient Position: Sitting, Cuff Size: Large)   Pulse 71   Ht 4' 11.5" (1.511 m)   Wt 151 lb 12.8 oz (68.9 kg)   SpO2 98%   BMI 30.15 kg/m   Physical Exam  Constitutional:  General: Not in acute distress.    Appearance: Normal appearance. Not ill-appearing.  HENT:     Head: Normocephalic and atraumatic.  Eyes:     Pupils: Pupils are equal, round Neck:     Musculoskeletal: Normal range of motion.  Cardiovascular:     Rate and Rhythm: Normal rate and regular rhythm.     Pulses: Normal pulses.     Heart sounds: Normal heart sounds. No murmur.  Pulmonary:     Effort: Pulmonary effort is normal. No respiratory distress.     Breath  sounds: Normal breath sounds. No wheezing.  Abdominal:     General: Abdomen is flat. There is no distension.     Palpations: Abdomen is soft.     Tenderness: There is no abdominal tenderness.  Musculoskeletal: Normal range of motion.  Skin:    General: Skin is warm and dry.     Findings: No erythema or rash.  Neurological:     General: No focal deficit present.     Mental Status: Alert and oriented to person, place, and time. Mental status is at baseline.     Motor: No weakness.  Psychiatric:        Mood and Affect: Mood normal.        Behavior: Behavior normal.    Assessment/Plan: The patient is scheduled for excision of right and left breast fat necrosis and bilateral liposuction of lateral breasts with Dr. Marla Roe.  Risks, benefits, and alternatives of procedure discussed, questions answered and consent obtained.    Smoking Status: Non-smoker; Counseling Given?  N/A Last Mammogram: 03/14/2020 followed by ultrasound of the right breast; Results: Stable appearance of postoperative changes of fat necrosis in the right breast, no seroma or other masses noted.  Ultrasound in the upper outer quadrant of the right breast showed findings consistent with postsurgical changes and fat necrosis.  Caprini Score: 7, high; Risk Factors include: Age, BMI greater than 25, length of planned surgery, mother with history of DVT (no other family history) Recommendation for mechanical and pharmacological prophylaxis. Encourage early ambulation.   Pictures obtained: 05/10/2020  Post-op Rx sent to pharmacy: Toradol, Keflex, Zofran  Patient was provided with the General Surgical Risk consent document and Pain Medication Agreement prior to their appointment.  They had adequate time to read through the risk consent documents and Pain Medication Agreement. We also discussed them in person together during this preop appointment. All of their questions were answered to their satisfaction.  Recommended calling  if they have any further questions.  Risk consent form and Pain Medication Agreement to be scanned into patient's chart.   Electronically signed by: Carola Rhine Scheeler, PA-C 06/16/2020 10:51 AM

## 2020-06-15 NOTE — Progress Notes (Signed)
Patient ID: Brenda [name], female    DOB: July 25, 1950, 70 y.o.   MRN: [id number]  Chief Complaint  Patient presents with  . Pre-op Exam      ICD-10-CM   1. Status post bilateral breast reduction  Z98.890   2. Symptomatic mammary hypertrophy  N62     History of Present Illness: CAREE [name] is a 70 y.o.  female  with a history of bilateral breast reduction on 10/17/2018 with Brenda [name], she subsequently underwent revisions with fat necrosis on 02/16/2019.  She presents for preoperative evaluation for upcoming procedure, excision of right breast fat necrosis and left inferior breast fat necrosis and bilateral liposuction of lateral breasts, scheduled for 06/30/2020 with Brenda [name].  The patient has not had problems with anesthesia. No history of DVT/PE.  Mother with history of DVT,.  No other family history of DVT.  Her mother DVT was associated with an accident, was not spontaneous..  No family or personal history of bleeding or clotting disorders.  Patient is not currently taking any blood thinners.    Summary of Previous Visit: Patient underwent bilateral breast reductions and subsequent revisions of fat necrosis with Brenda [name].  She has some concerns about firmness of fat necrosis in the right lateral breast and some excess tissue on the breast areas bilaterally.  Her mammograms are up-to-date.  There is no sign of infection.  PMH Significant for: Hypertension, hyperlipidemia, asthma, systolic murmur, CVA with no residual deficits.  She has a history of fibromyalgia.   Patient reports she did well with previous anesthesia, she does have PON V, but has done well in the past with scopolamine patch.  Past Medical History: Allergies: Allergies  Allergen Reactions  . Codeine Other (See Comments)    SEVERE ELEVATED LIVER ENZYMES  . Latex Rash  . Stadol [Butorphanol Tartrate] Swelling    Lips numb, tongue swell, tachycardia  . Adhesive [Tape] Other (See Comments)     TEARS SKIN; PLEASE USE COBAN WRAP!!  . Bupivacaine Other (See Comments)    Other Reaction: PRE-SYNCOPE, TACTYCARDIA TACHYCARDIA/ PRESYNCOPE  . Butorphanol Tartrate Other (See Comments)    NUMBNESS, TACHYCARDIA  . Clarithromycin Nausea Only and Other (See Comments)    TACHYCARDIA  . Cymbalta [Duloxetine Hcl] Other (See Comments)    Blurred vision  . Dilaudid [Hydromorphone Hcl] Itching    SEVERE ITCHING  . Elavil [Amitriptyline Hcl] Other (See Comments)    Elevated liver enzymes  . Hydrocodone Other (See Comments)    Elevated liver enzymes  . Hyoscyamine Sulfate Other (See Comments)    Dizziness, nausea, headache  . Levbid [Hyoscyamine Sulfate] Other (See Comments)    Dizziness, nausea, headache  . Lisinopril Cough    Dry cough  . Marcaine [Bupivacaine Hcl] Other (See Comments)    TACHYCARDIA, PRESYNCOPE  . Pentazocine Lactate Other (See Comments)    Tachycardia   . Percodan [Oxycodone-Aspirin] Nausea Only  . Trazodone And Nefazodone Other (See Comments)    Tachycardia   . Vistaril [Hydroxyzine Hcl] Other (See Comments)    Altered sensorium  . Morphine And Related Itching and Rash    Elevate liver enzymes    Current Medications:  Current Outpatient Medications:  .  acetaminophen (TYLENOL) 325 MG tablet, Take 500 mg by mouth every 6 (six) hours as needed (for headaches). , Disp: , Rfl:  .  albuterol (VENTOLIN HFA) 108 (90 Base) MCG/ACT inhaler, Inhale into the lungs every 6 (six) hours as needed for wheezing or  shortness of breath., Disp: , Rfl:  .  chlorpheniramine (CHLOR-TRIMETON) 4 MG tablet, Take 4 mg by mouth 2 (two) times daily as needed for allergies., Disp: , Rfl:  .  ezetimibe (ZETIA) 10 MG tablet, Take 10 mg by mouth every evening., Disp: , Rfl:  .  famotidine (PEPCID) 20 MG tablet, Take 20 mg by mouth at bedtime., Disp: , Rfl:  .  FLUoxetine (PROZAC) 10 MG capsule, Take 1 capsule (10 mg total) by mouth daily., Disp: 90 capsule, Rfl: 3 .  fluticasone (FLONASE) 50  MCG/ACT nasal spray, Place 2 sprays into both nostrils daily., Disp: , Rfl:  .  folic acid (FOLVITE) 1 MG tablet, Take 1 mg by mouth daily., Disp: , Rfl:  .  LORazepam (ATIVAN) 1 MG tablet, TAKE 1 TABLET EVERY 8 HOURS AS NEEDED FOR ANXIETY., Disp: 90 tablet, Rfl: 2 .  losartan (COZAAR) 50 MG tablet, Take 50 mg by mouth daily., Disp: , Rfl:  .  metoprolol (LOPRESSOR) 50 MG tablet, Take 25 mg by mouth 2 (two) times daily., Disp: , Rfl:  .  Multiple Vitamins-Minerals (HAIR SKIN AND NAILS FORMULA PO), Take by mouth as needed., Disp: , Rfl:  .  Omega-3 Fatty Acids (OMEGA-3 FISH OIL PO), Take by mouth. Takes one daily does not take every day, Disp: , Rfl:  .  OVER THE COUNTER MEDICATION, Brain and memory power boost - takes only 1 but does not take every day, Disp: , Rfl:  .  pantoprazole (PROTONIX) 40 MG tablet, 40 mg daily. , Disp: , Rfl:  .  Probiotic Product (PROBIOTIC PO), Take 1 capsule by mouth every evening. , Disp: , Rfl:  .  REPATHA SURECLICK 762 MG/ML SOAJ, Inject SQ into the abdomen every 2 weeks Pt reports taking every 2 weeks on 02/10/20, Disp: , Rfl:  .  Vitamin D, Ergocalciferol, (DRISDOL) 50000 UNITS CAPS, Take 50,000 Units by mouth every 7 (seven) days., Disp: , Rfl:  .  zolpidem (AMBIEN) 5 MG tablet, TAKE 1 TABLET AT BEDTIME AS NEEDED FOR INSOMNIA., Disp: 30 tablet, Rfl: 5  Past Medical Problems: Past Medical History:  Diagnosis Date  . Anxiety   . Arthritis   . Bladder pain   . Cervical disc disease    THINKS C 4 TO C 5  . Chronic cough    followed by Brenda [name]  . Chronic kidney disease    stage IIIA  . Chronic seasonal allergic rhinitis   . Depression   . Dyspnea    with exertion   . Fibromyalgia   . Frequency of urination   . GERD (gastroesophageal reflux disease)   . Heart murmur    per pt pcp only hear's murmur occasionally, asymptomatic  . History of pancreatitis 1988-1989  . History of panic attacks   . Hyperlipidemia   . Hypertension  LABILE   followed by pcp  . IBS (irritable bowel syndrome)   . Interstitial cystitis    urologist-- Brenda [name]  . Lumbar spondylosis   . Migraines   . Moderate persistent asthma    followed by pcp and Brenda [name]   . Multiple pulmonary nodules    pulmonologist-- Brenda [name]  . Normal cardiac stress test JULY 2009---  NORMAL  . PONV (postoperative nausea and vomiting)   . 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 Spasm of sphincter  of Oddi 1987   02-13-2019  per pt no issues since she watches what medication's she takes  . Stroke [facility])    hx of 2 strokes in past- years ago - found mri   . Urethral stenosis S/P DILATIONS  . Urgency of urination     Past Surgical History: Past Surgical History:  Procedure Laterality Date  . ABDOMINAL HYSTERECTOMY  1988   COMPLETE  . APPENDECTOMY  04/ 1986  . bilateral reduction mastopexy  1988  . bilateral turbinate resection  1990   nasal spreading graft  . BREAST CYST EXCISION Bilateral 03/18/2019   Procedure: excision necrotic breast tissue left ;  Surgeon: Wallace Going, DO;  Location: [place];  Service: Plastics;  Laterality: Bilateral;  . BREAST REDUCTION SURGERY  10/17/2018   Procedure: bilateral breast reduction;  Surgeon: Wallace Going, DO;  Location: [place];  Service: Plastics;;  please adjust time to reflect 4 hours  . CATARACT EXTRACTION W/ INTRAOCULAR LENS  IMPLANT, BILATERAL  08 and 09/ 2017  . CHOLECYSTECTOMY  04/ 1986  . COLONOSCOPY  02/2004, 11/2010, LAST 03-2016 ENDO DONE ALSO   2005: Normal - Brenda. Sammuel Cooper 2012: Normal  . CYSTO WITH HYDRODISTENSION N/A 12/17/2013   Procedure: CYSTO HYDRODISTENSION WITH INSTALLATION OF CHLOROPACTIN AND TETRACAINE;  Surgeon: Malka So, MD;  Location: [facility];  Service: Urology;  Laterality: N/A;  . CYSTO WITH HYDRODISTENSION N/A 06/02/2015   Procedure:  CYSTOSCOPY/HYDRODISTENSION WITH INSTILLATION OF CLORPACTIN ;  Surgeon: [name], MD;  [facility];  Service: Urology;  Laterality: N/A;  . CYSTO WITH HYDRODISTENSION N/A 11/08/2016   Procedure: CYSTOSCOPY/HYDRODISTENSION INSTILLATION OF CLORPACTIN;  Surgeon: [name], MD;  Location[facility];  Service: Urology;  Laterality: N/A;  . CYSTO WITH HYDRODISTENSION N/A 03/06/2018   Procedure: CYSTOSCOPY/HYDRODISTENSION INSTILL CHLORPACTION;  Surgeon: [name], MD;  Location: [facility];  Service: Urology;  Laterality: N/A;  . CYSTO WITH HYDRODISTENSION N/A 04/02/2019   Procedure: CYSTOSCOPY/HYDRODISTENSION INSTILL CHLORPACTIN;  Surgeon: [name], MD;  Location: [facility];  Service: Urology;  Laterality: N/A;  . CYSTO WITH HYDRODISTENSION N/A 02/17/2020   Procedure: CYSTOSCOPY/HYDRODISTENTION WITH INSTILLATION OF CHLORPACTIN;  Surgeon: [name], MD;  Location: [facility];  Service: Urology;  Laterality: N/A;  ONLY NEEDS 30 MIN  . CYSTOSCOPY WITH URETHRAL DILATATION  07/12/2011   Procedure: CYSTOSCOPY WITH URETHRAL DILATATION;  Surgeon: Malka So, MD;  Location: [facility];  Service: Urology;  Laterality: N/A;  HOD AND INSTILLATION OF CHLOROPACTIN C-ARM   . CYSTOSCOPY WITH URETHRAL DILATATION N/A 06/26/2012   Procedure: CYSTOSCOPY WITH URETHRAL DILATATION HYDRODISTENSION AND INSTILLATION OF CLORPACTION ;  Surgeon: Malka So, MD;  Location[facility];  Service: Urology;  Laterality: N/A;  CYSTOSCOPY WITH URETHRAL DILATATION HYDRODISTENSION AND INSTILLATION OF CLORPACTION   . CYSTOSTOMY W/ BLADDER BIOPSY  1983  . DG BARIUM SWALLOW ([place] HX)  03/2016  . DILATION AND CURETTAGE OF UTERUS  1987  . ERCP  1988   for pancreatitis with stents   S/P SEVERE PANCREATITIS  . HEMORRHOID SURGERY    . LASIK Bilateral APRIL 2005  . LIPOSUCTION Bilateral 10/17/2018   Procedure:  LIPOSUCTION;  Surgeon: Wallace Going, DO;  Location: [place];  Service: Plastics;  Laterality: Bilateral;  . LIPOSUCTION Right 03/18/2019   Procedure: excision right necrotic breast tissue with liposuction;  Surgeon: Wallace Going, DO;  Location: [place];  Service: Plastics;  Laterality: Right;  . MULTIPLE CYSTO/ HOD/ URETHRAL DILATION/ INSTILLATION CLORPACTIN  .LAST ONE 03-31-2010  . NASAL SINUS SURGERY  1977   sinus cyst  . REDUCTION MAMMAPLASTY Bilateral 1988  . REDUCTION MAMMAPLASTY Bilateral 09/2018  . REDUCTION MAMMAPLASTY Bilateral 03/2019  . revision rhinoplasty  1978  . RHINOPLASTY  1977  . RIGHT SALPINGOOPHECTOMY  1998  . TONSILLECTOMY AND ADENOIDECTOMY  1963  . VIDEO BRONCHOSCOPY Bilateral 08/23/2016   Procedure: VIDEO BRONCHOSCOPY WITHOUT FLUORO;  Surgeon: Javier Glazier, MD;  Location: [facility];  Service: Cardiopulmonary;  Laterality: Bilateral;    Social History: Social History   Socioeconomic History  . Marital status: Married    Spouse name: Not on file  . Number of children: 0  . Years of education: 21  . Highest education level: Not on file  Occupational History  . Occupation: Therapist, sports  Tobacco Use  . Smoking status: Never Smoker  . Smokeless tobacco: Never Used  Vaping Use  . Vaping Use: Never used  Substance and Sexual Activity  . Alcohol use: No  . Drug use: No  . Sexual activity: Not on file  Other Topics Concern  . Not on file  Social History Narrative   Married, lives with spouse   No children   RN at [employer]   No recent travel      [facility]:   Originally from [place]. Always lived in [place]. Previously has traveled to [place], [place], [place], [place], & mostly [place]. No indoor pets currently. She does have cats in her garage. No bird, mold, or hot tub exposure. No indoor plants. Mostly wood floors. She did have her bedroom carpet taken up in Summer 2017. Has mostly blinds. No  wood burning fire place. Previously enjoyed Firefighter & playing piano.    Social Determinants of Health   Financial Resource Strain: Not on file  Food Insecurity: Not on file  Transportation Needs: Not on file  Physical Activity: Not on file  Stress: Not on file  Social Connections: Not on file  Intimate Partner Violence: Not on file    Family History: Family History  Problem Relation Age of Onset  . Colon cancer Maternal Grandmother   . Colon cancer Cousin   . Allergies Mother   . Asthma Mother   . Heart disease Mother   . Osteoarthritis Mother   . Diabetes Mother   . Irritable bowel syndrome Mother   . Brain cancer Father   . Osteoarthritis Father   . Colon polyps Father        adenomatous  . Heart disease Father   . Melanoma Sister   . Osteoarthritis Sister   . Irritable bowel syndrome Sister   . Diabetes Sister   . Heart disease Sister   . Stroke Sister   . Ulcerative colitis Maternal Uncle   . Stomach cancer Neg Hx   . Rheumatologic disease Neg Hx     Review of Systems: Review of Systems  Constitutional: Negative.   Respiratory: Negative.   Cardiovascular: Negative.   Gastrointestinal: Negative.   Genitourinary: Negative.   Neurological: Negative.    Physical Exam: Vital Signs BP (!) 148/85 (BP Location: Left Arm, Patient Position: Sitting, Cuff Size: Large)   Pulse 71   Ht 4' 11.5" (1.511 m)   Wt 151 lb 12.8 oz (68.9 kg)   SpO2 98%   BMI 30.15 kg/m   Physical Exam  Constitutional:  General: Not in acute distress.    Appearance: Normal appearance. Not ill-appearing.  HENT:     Head: Normocephalic and atraumatic.  Eyes:     Pupils: Pupils are equal, round Neck:     Musculoskeletal: Normal range of motion.  Cardiovascular:     Rate and Rhythm: Normal rate and regular rhythm.     Pulses: Normal pulses.     Heart sounds: Normal heart sounds. No murmur.  Pulmonary:     Effort: Pulmonary effort is normal. No respiratory distress.     Breath  sounds: Normal breath sounds. No wheezing.  Abdominal:     General: Abdomen is flat. There is no distension.     Palpations: Abdomen is soft.     Tenderness: There is no abdominal tenderness.  Musculoskeletal: Normal range of motion.  Skin:    General: Skin is warm and dry.     Findings: No erythema or rash.  Neurological:     General: No focal deficit present.     Mental Status: Alert and oriented to person, place, and time. Mental status is at baseline.     Motor: No weakness.  Psychiatric:        Mood and Affect: Mood normal.        Behavior: Behavior normal.    Assessment/Plan: The patient is scheduled for excision of right and left breast fat necrosis and bilateral liposuction of lateral breasts with Brenda [name].  Risks, benefits, and alternatives of procedure discussed, questions answered and consent obtained.    Smoking Status: Non-smoker; Counseling Given?  N/A Last Mammogram: 03/14/2020 followed by ultrasound of the right breast; Results: Stable appearance of postoperative changes of fat necrosis in the right breast, no seroma or other masses noted.  Ultrasound in the upper outer quadrant of the right breast showed findings consistent with postsurgical changes and fat necrosis.  Caprini Score: 7, high; Risk Factors include: Age, BMI greater than 25, length of planned surgery, mother with history of DVT (no other family history) Recommendation for mechanical and pharmacological prophylaxis. Encourage early ambulation.   Pictures obtained: 05/10/2020  Post-op Rx sent to pharmacy: Toradol, Keflex, Zofran  Patient was provided with the General Surgical Risk consent document and Pain Medication Agreement prior to their appointment.  They had adequate time to read through the risk consent documents and Pain Medication Agreement. We also discussed them in person together during this preop appointment. All of their questions were answered to their satisfaction.  Recommended calling  if they have any further questions.  Risk consent form and Pain Medication Agreement to be scanned into patient's chart.   Electronically signed by: Carola Rhine Cowan Pilar, PA-C 06/16/2020 10:51 AM

## 2020-06-16 ENCOUNTER — Encounter: Payer: Self-pay | Admitting: Surgical

## 2020-06-16 MED ORDER — CEPHALEXIN 500 MG PO CAPS: 500.0000 mg | ORAL_CAPSULE | Freq: Four times a day (QID) | ORAL | 0 refills | Status: AC

## 2020-06-16 MED ORDER — ONDANSETRON HCL 4 MG PO TABS: 4.0000 mg | ORAL_TABLET | Freq: Three times a day (TID) | ORAL | 0 refills | Status: DC | PRN

## 2020-06-16 MED ORDER — KETOROLAC TROMETHAMINE 10 MG PO TABS: 10.0000 mg | ORAL_TABLET | Freq: Four times a day (QID) | ORAL | 0 refills | Status: DC | PRN

## 2020-06-23 IMAGING — MG DIGITAL SCREENING BILATERAL MAMMOGRAM WITH TOMO AND CAD
8 series · 8 of 24 positions shown · non-contrast
Comparison: Previous exam(s).

CLINICAL DATA: Screening.

EXAM:
DIGITAL SCREENING BILATERAL MAMMOGRAM WITH TOMO AND CAD

[L MLO synth-2D]
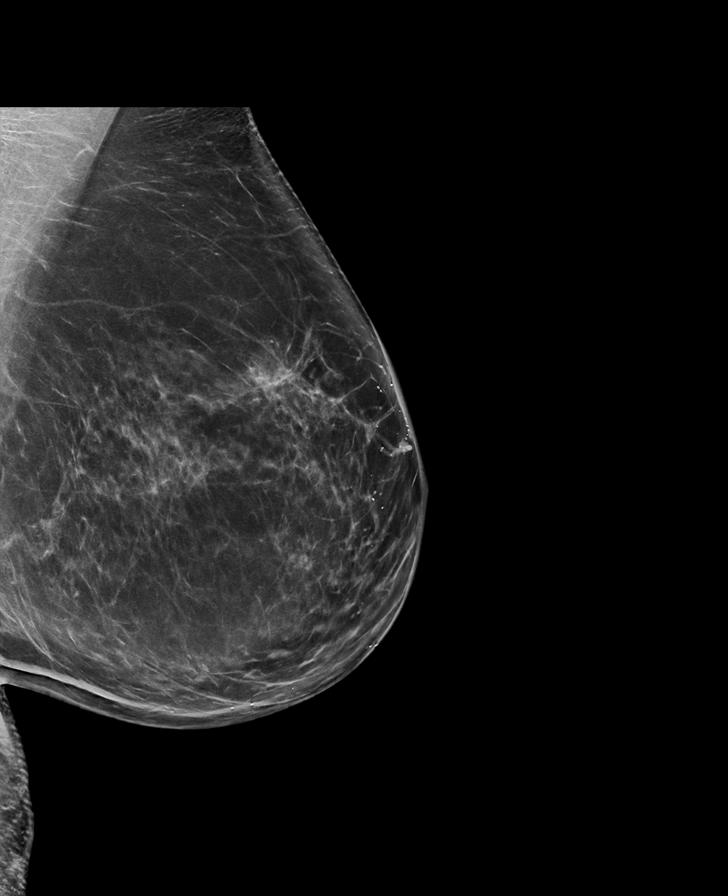

[L CC synth-2D]
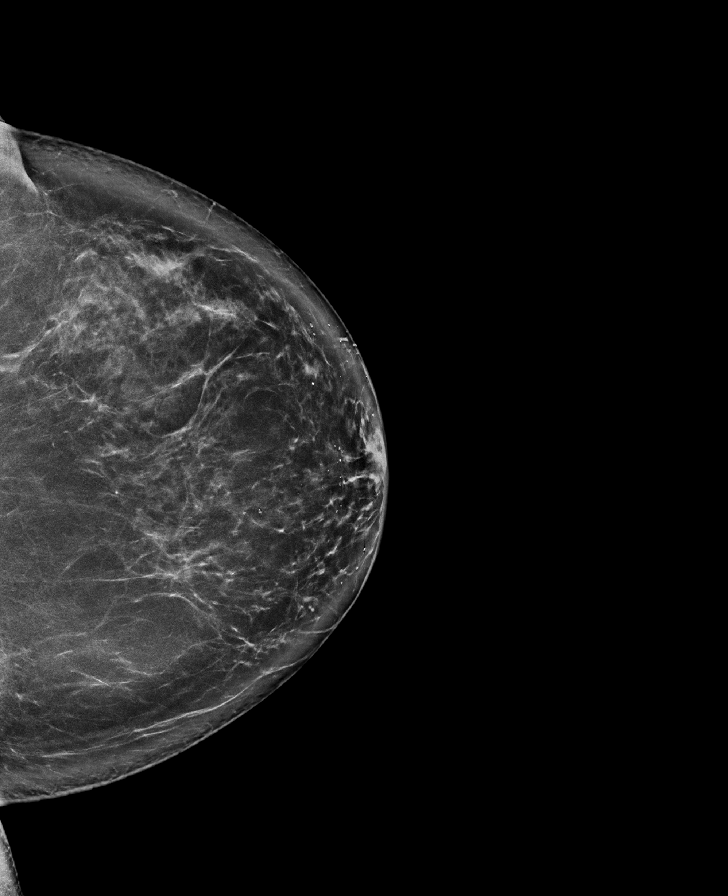

[R MLO synth-2D]
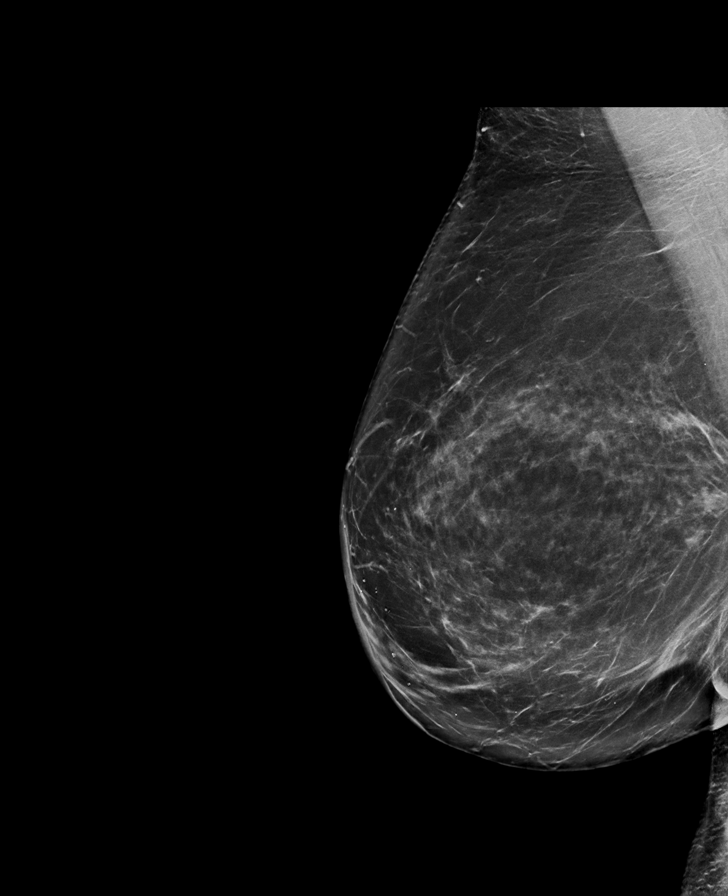

[R CC synth-2D]
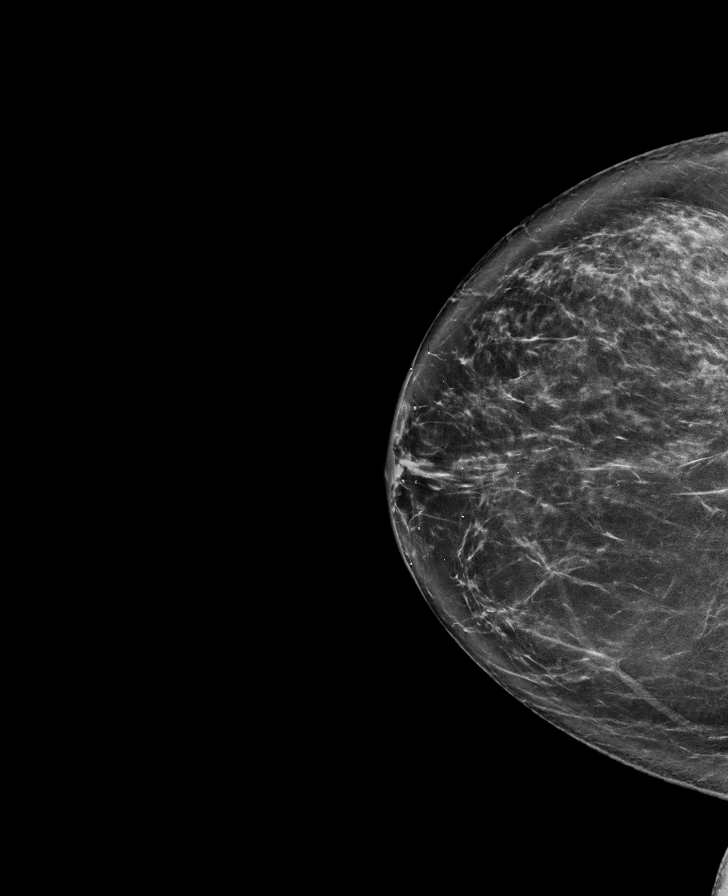

[L CC tomo · tomo slice 47/93.0]
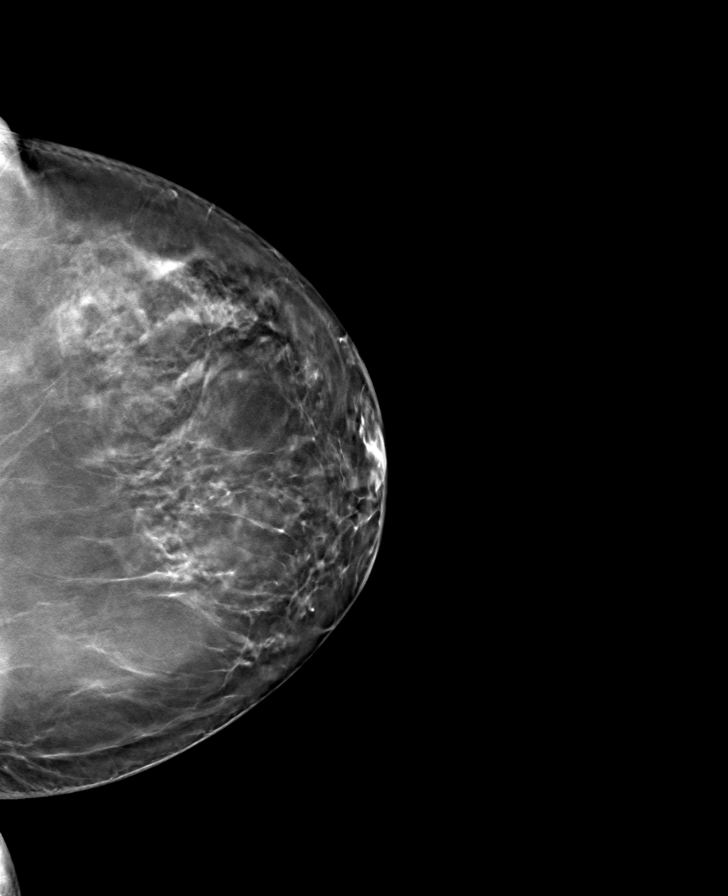

[R CC tomo · tomo slice 42/83.0]
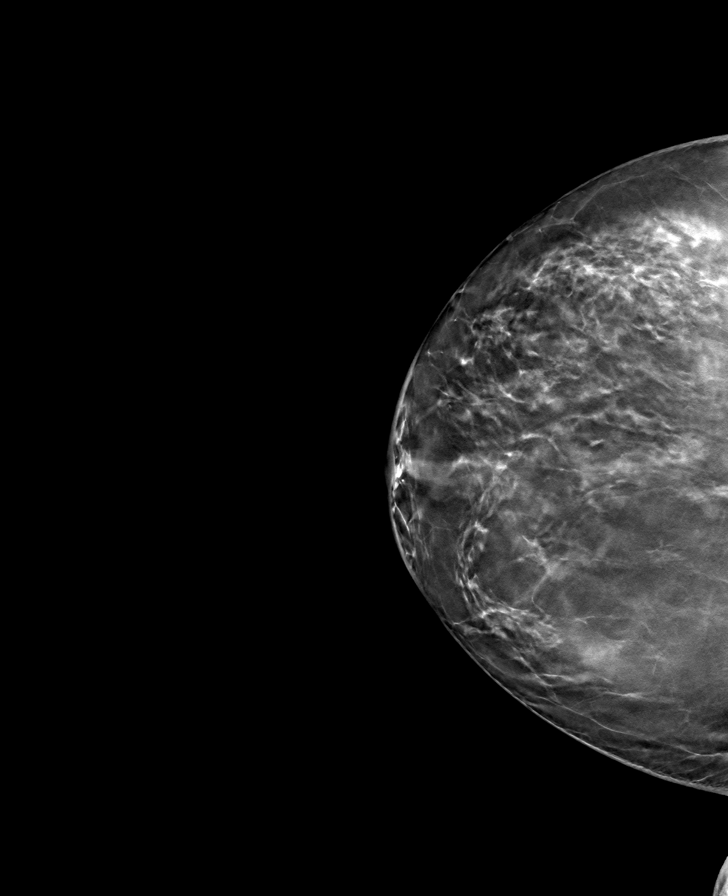

[R MLO tomo · tomo slice 47/93.0]
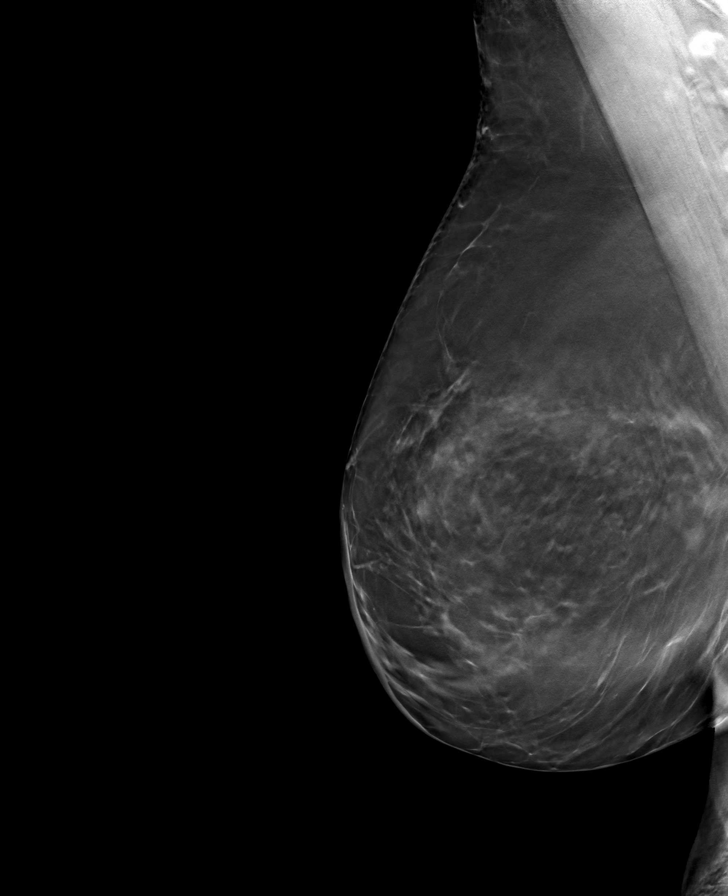

[L MLO tomo · tomo slice 46/91.0]
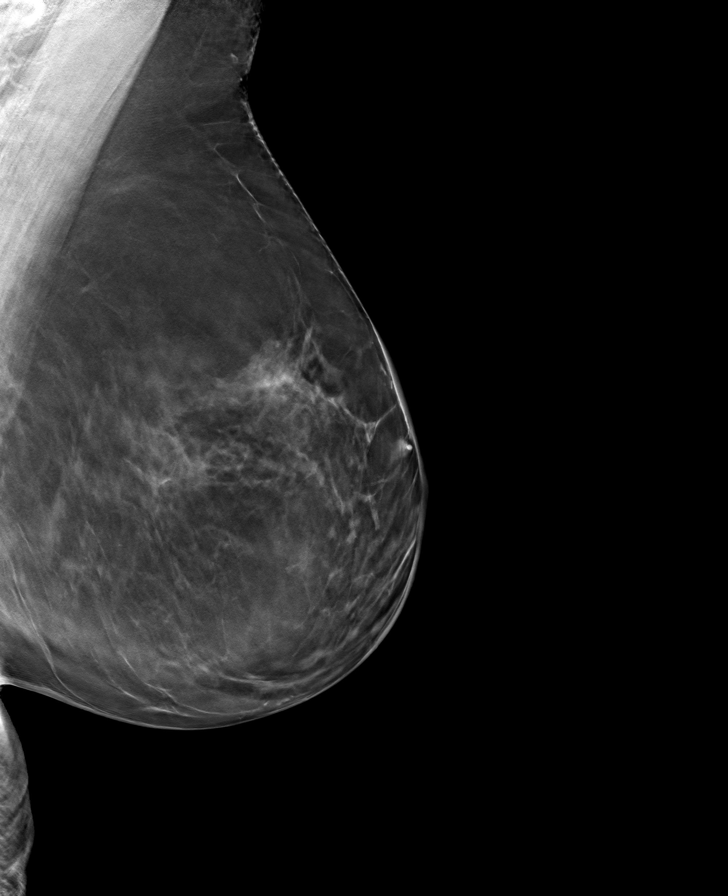

[8 of 24 positions shown; findings below may reference images not displayed]

ACR Breast Density Category b: There are scattered areas of
fibroglandular density.
FINDINGS: There are no findings suspicious for malignancy. Images were
processed with CAD.
IMPRESSION: No mammographic evidence of malignancy. A result letter of this
screening mammogram will be mailed directly to the patient.

RECOMMENDATION:
Screening mammogram in one year. (Code:[id number])

BI-RADS CATEGORY  1: Negative.

## 2020-06-24 ENCOUNTER — Encounter (HOSPITAL_BASED_OUTPATIENT_CLINIC_OR_DEPARTMENT_OTHER): Payer: Self-pay | Admitting: Plastic Surgery

## 2020-06-24 ENCOUNTER — Other Ambulatory Visit: Payer: Self-pay

## 2020-06-25 ENCOUNTER — Other Ambulatory Visit: Payer: Self-pay | Admitting: Psychiatry

## 2020-06-25 DIAGNOSIS — F4001 Agoraphobia with panic disorder: Secondary | ICD-10-CM

## 2020-06-25 DIAGNOSIS — F411 Generalized anxiety disorder: Secondary | ICD-10-CM

## 2020-06-27 ENCOUNTER — Other Ambulatory Visit (HOSPITAL_COMMUNITY)
Admission: RE | Admit: 2020-06-27 | Discharge: 2020-06-27 | Disposition: A | Discharge status: Home / Self Care | Payer: PPO | Source: Ambulatory Visit | Attending: Plastic Surgery | Admitting: Plastic Surgery

## 2020-06-27 ENCOUNTER — Encounter (HOSPITAL_BASED_OUTPATIENT_CLINIC_OR_DEPARTMENT_OTHER)
Admission: RE | Admit: 2020-06-27 | Discharge: 2020-06-27 | Disposition: A | Discharge status: Home / Self Care | Payer: PPO | Source: Ambulatory Visit | Attending: Plastic Surgery | Admitting: Plastic Surgery

## 2020-06-27 DIAGNOSIS — Z01812 Encounter for preprocedural laboratory examination: Secondary | ICD-10-CM | POA: Insufficient documentation

## 2020-06-27 DIAGNOSIS — Z20822 Contact with and (suspected) exposure to covid-19: Secondary | ICD-10-CM | POA: Insufficient documentation

## 2020-06-28 LAB — SARS CORONAVIRUS 2 (TAT 6-24 HRS): SARS Coronavirus 2: NEGATIVE

## 2020-06-29 NOTE — Anesthesia Preprocedure Evaluation (Addendum)
Anesthesia Evaluation  Patient identified by MRN, date of birth, ID band Patient awake    Reviewed: Allergy & Precautions, NPO status , Patient's Chart, lab work & pertinent test results, reviewed documented beta blocker date and time   History of Anesthesia Complications (+) PONV and history of anesthetic complications  Airway Mallampati: II  TM Distance: >3 FB Neck ROM: Full    Dental  (+) Partial Upper   Pulmonary asthma ,    Pulmonary exam normal        Cardiovascular hypertension, Pt. on home beta blockers and Pt. on medications Normal cardiovascular exam     Neuro/Psych  Headaches, Anxiety Depression CVA    GI/Hepatic Neg liver ROS, GERD  Medicated and Controlled,  Endo/Other  negative endocrine ROS  Renal/GU Renal InsufficiencyRenal disease  negative genitourinary   Musculoskeletal  (+) Arthritis , Fibromyalgia -  Abdominal   Peds  Hematology negative hematology ROS (+)   Anesthesia Other Findings Day of surgery medications reviewed with patient.  Reproductive/Obstetrics negative OB ROS                           Anesthesia Physical Anesthesia Plan  ASA: II  Anesthesia Plan: General   Post-op Pain Management:    Induction: Intravenous  PONV Risk Score and Plan: 4 or greater and Midazolam, Treatment may vary due to age or medical condition, Ondansetron, Dexamethasone and Propofol infusion  Airway Management Planned: LMA  Additional Equipment: None  Intra-op Plan:   Post-operative Plan: Extubation in OR  Informed Consent: I have reviewed the patients History and Physical, chart, labs and discussed the procedure including the risks, benefits and alternatives for the proposed anesthesia with the patient or authorized representative who has indicated his/her understanding and acceptance.     Dental advisory given  Plan Discussed with: CRNA  Anesthesia Plan Comments:         Anesthesia Quick Evaluation

## 2020-06-30 ENCOUNTER — Telehealth: Payer: Self-pay | Admitting: Surgical

## 2020-06-30 ENCOUNTER — Ambulatory Visit (HOSPITAL_BASED_OUTPATIENT_CLINIC_OR_DEPARTMENT_OTHER): Payer: PPO | Admitting: Anesthesiology

## 2020-06-30 ENCOUNTER — Other Ambulatory Visit: Payer: Self-pay

## 2020-06-30 ENCOUNTER — Encounter (HOSPITAL_BASED_OUTPATIENT_CLINIC_OR_DEPARTMENT_OTHER): Payer: Self-pay | Admitting: Plastic Surgery

## 2020-06-30 ENCOUNTER — Ambulatory Visit (HOSPITAL_BASED_OUTPATIENT_CLINIC_OR_DEPARTMENT_OTHER)
Admission: RE | Admit: 2020-06-30 | Discharge: 2020-06-30 | Disposition: A | Discharge status: Home / Self Care | Payer: PPO | Attending: Plastic Surgery | Admitting: Plastic Surgery

## 2020-06-30 ENCOUNTER — Encounter (HOSPITAL_BASED_OUTPATIENT_CLINIC_OR_DEPARTMENT_OTHER)
Admission: RE | Disposition: A | Discharge status: Home / Self Care | Payer: Self-pay | Source: Home / Self Care | Attending: Plastic Surgery

## 2020-06-30 DIAGNOSIS — Z8673 Personal history of transient ischemic attack (TIA), and cerebral infarction without residual deficits: Secondary | ICD-10-CM | POA: Diagnosis not present

## 2020-06-30 DIAGNOSIS — I10 Essential (primary) hypertension: Secondary | ICD-10-CM | POA: Diagnosis not present

## 2020-06-30 DIAGNOSIS — N6489 Other specified disorders of breast: Secondary | ICD-10-CM | POA: Insufficient documentation

## 2020-06-30 DIAGNOSIS — N1831 Chronic kidney disease, stage 3a: Secondary | ICD-10-CM | POA: Insufficient documentation

## 2020-06-30 DIAGNOSIS — F418 Other specified anxiety disorders: Secondary | ICD-10-CM | POA: Diagnosis not present

## 2020-06-30 DIAGNOSIS — Z885 Allergy status to narcotic agent status: Secondary | ICD-10-CM | POA: Insufficient documentation

## 2020-06-30 DIAGNOSIS — Z8249 Family history of ischemic heart disease and other diseases of the circulatory system: Secondary | ICD-10-CM | POA: Diagnosis not present

## 2020-06-30 DIAGNOSIS — Z91040 Latex allergy status: Secondary | ICD-10-CM | POA: Insufficient documentation

## 2020-06-30 DIAGNOSIS — Z800 Family history of malignant neoplasm of digestive organs: Secondary | ICD-10-CM | POA: Diagnosis not present

## 2020-06-30 DIAGNOSIS — Z888 Allergy status to other drugs, medicaments and biological substances status: Secondary | ICD-10-CM | POA: Insufficient documentation

## 2020-06-30 DIAGNOSIS — N651 Disproportion of reconstructed breast: Secondary | ICD-10-CM | POA: Diagnosis not present

## 2020-06-30 DIAGNOSIS — Z9049 Acquired absence of other specified parts of digestive tract: Secondary | ICD-10-CM | POA: Insufficient documentation

## 2020-06-30 DIAGNOSIS — Z808 Family history of malignant neoplasm of other organs or systems: Secondary | ICD-10-CM | POA: Diagnosis not present

## 2020-06-30 DIAGNOSIS — I129 Hypertensive chronic kidney disease with stage 1 through stage 4 chronic kidney disease, or unspecified chronic kidney disease: Secondary | ICD-10-CM | POA: Diagnosis not present

## 2020-06-30 DIAGNOSIS — Z90710 Acquired absence of both cervix and uterus: Secondary | ICD-10-CM | POA: Diagnosis not present

## 2020-06-30 DIAGNOSIS — D241 Benign neoplasm of right breast: Secondary | ICD-10-CM | POA: Insufficient documentation

## 2020-06-30 DIAGNOSIS — Z881 Allergy status to other antibiotic agents status: Secondary | ICD-10-CM | POA: Diagnosis not present

## 2020-06-30 DIAGNOSIS — Z79899 Other long term (current) drug therapy: Secondary | ICD-10-CM | POA: Diagnosis not present

## 2020-06-30 DIAGNOSIS — D242 Benign neoplasm of left breast: Secondary | ICD-10-CM | POA: Diagnosis not present

## 2020-06-30 DIAGNOSIS — N641 Fat necrosis of breast: Secondary | ICD-10-CM | POA: Diagnosis not present

## 2020-06-30 DIAGNOSIS — Z884 Allergy status to anesthetic agent status: Secondary | ICD-10-CM | POA: Diagnosis not present

## 2020-06-30 DIAGNOSIS — E785 Hyperlipidemia, unspecified: Secondary | ICD-10-CM | POA: Insufficient documentation

## 2020-06-30 DIAGNOSIS — Z411 Encounter for cosmetic surgery: Secondary | ICD-10-CM | POA: Diagnosis not present

## 2020-06-30 DIAGNOSIS — J4530 Mild persistent asthma, uncomplicated: Secondary | ICD-10-CM | POA: Diagnosis not present

## 2020-06-30 HISTORY — PX: BREAST CYST EXCISION: SHX579

## 2020-06-30 HISTORY — PX: LIPOSUCTION: SHX10

## 2020-06-30 SURGERY — EXCISION, CYST, BREAST
Anesthesia: General | Site: Breast | Laterality: Bilateral

## 2020-06-30 MED ORDER — DEXAMETHASONE SODIUM PHOSPHATE 10 MG/ML IJ SOLN
INTRAMUSCULAR | Status: AC
  Filled 2020-06-30: qty 1

## 2020-06-30 MED ORDER — FENTANYL CITRATE (PF) 100 MCG/2ML IJ SOLN
INTRAMUSCULAR | Status: DC | PRN
  Administered 2020-06-30: 50 ug via INTRAVENOUS

## 2020-06-30 MED ORDER — ACETAMINOPHEN 325 MG PO TABS: 650.0000 mg | ORAL_TABLET | ORAL | Status: DC | PRN

## 2020-06-30 MED ORDER — ACETAMINOPHEN 500 MG PO TABS
1000.0000 mg | ORAL_TABLET | Freq: Once | ORAL | Status: AC
  Administered 2020-06-30: 1000 mg via ORAL

## 2020-06-30 MED ORDER — SODIUM CHLORIDE 0.9 % IV SOLN: 250.0000 mL | INTRAVENOUS | Status: DC | PRN

## 2020-06-30 MED ORDER — ONDANSETRON HCL 4 MG/2ML IJ SOLN
INTRAMUSCULAR | Status: AC
  Filled 2020-06-30: qty 2

## 2020-06-30 MED ORDER — MIDAZOLAM HCL 2 MG/2ML IJ SOLN
INTRAMUSCULAR | Status: AC
  Filled 2020-06-30: qty 2

## 2020-06-30 MED ORDER — LIDOCAINE 2% (20 MG/ML) 5 ML SYRINGE
INTRAMUSCULAR | Status: DC | PRN
  Administered 2020-06-30: 60 mg via INTRAVENOUS

## 2020-06-30 MED ORDER — LIDOCAINE HCL (PF) 1 % IJ SOLN
INTRAMUSCULAR | Status: AC
  Filled 2020-06-30: qty 30

## 2020-06-30 MED ORDER — BUPIVACAINE HCL (PF) 0.25 % IJ SOLN
INTRAMUSCULAR | Status: AC
  Filled 2020-06-30: qty 30

## 2020-06-30 MED ORDER — SUCCINYLCHOLINE CHLORIDE 200 MG/10ML IV SOSY
PREFILLED_SYRINGE | INTRAVENOUS | Status: AC
  Filled 2020-06-30: qty 10

## 2020-06-30 MED ORDER — SCOPOLAMINE 1 MG/3DAYS TD PT72
1.0000 | MEDICATED_PATCH | Freq: Once | TRANSDERMAL | Status: DC
  Administered 2020-06-30: 1.5 mg via TRANSDERMAL

## 2020-06-30 MED ORDER — LIDOCAINE 2% (20 MG/ML) 5 ML SYRINGE
INTRAMUSCULAR | Status: AC
  Filled 2020-06-30: qty 5

## 2020-06-30 MED ORDER — FENTANYL CITRATE (PF) 100 MCG/2ML IJ SOLN
25.0000 ug | INTRAMUSCULAR | Status: DC | PRN
  Administered 2020-06-30 (×3): 25 ug via INTRAVENOUS

## 2020-06-30 MED ORDER — SODIUM CHLORIDE 0.9% FLUSH: 3.0000 mL | INTRAVENOUS | Status: DC | PRN

## 2020-06-30 MED ORDER — ACETAMINOPHEN 500 MG PO TABS
ORAL_TABLET | ORAL | Status: AC
  Filled 2020-06-30: qty 2

## 2020-06-30 MED ORDER — ONDANSETRON HCL 4 MG/2ML IJ SOLN
INTRAMUSCULAR | Status: DC | PRN
  Administered 2020-06-30: 4 mg via INTRAVENOUS

## 2020-06-30 MED ORDER — FENTANYL CITRATE (PF) 100 MCG/2ML IJ SOLN
INTRAMUSCULAR | Status: AC
  Filled 2020-06-30: qty 2

## 2020-06-30 MED ORDER — EPHEDRINE 5 MG/ML INJ
INTRAVENOUS | Status: AC
  Filled 2020-06-30: qty 10

## 2020-06-30 MED ORDER — DEXAMETHASONE SODIUM PHOSPHATE 4 MG/ML IJ SOLN
INTRAMUSCULAR | Status: DC | PRN
  Administered 2020-06-30: 10 mg via INTRAVENOUS

## 2020-06-30 MED ORDER — LIDOCAINE-EPINEPHRINE 1 %-1:100000 IJ SOLN
INTRAMUSCULAR | Status: AC
  Filled 2020-06-30: qty 1

## 2020-06-30 MED ORDER — CEFAZOLIN SODIUM-DEXTROSE 2-4 GM/100ML-% IV SOLN
INTRAVENOUS | Status: AC
  Filled 2020-06-30: qty 100

## 2020-06-30 MED ORDER — MIDAZOLAM HCL 5 MG/5ML IJ SOLN
INTRAMUSCULAR | Status: DC | PRN
  Administered 2020-06-30: 1 mg via INTRAVENOUS

## 2020-06-30 MED ORDER — PROMETHAZINE HCL 25 MG/ML IJ SOLN: 6.2500 mg | INTRAMUSCULAR | Status: DC | PRN

## 2020-06-30 MED ORDER — PHENYLEPHRINE 40 MCG/ML (10ML) SYRINGE FOR IV PUSH (FOR BLOOD PRESSURE SUPPORT)
PREFILLED_SYRINGE | INTRAVENOUS | Status: AC
  Filled 2020-06-30: qty 10

## 2020-06-30 MED ORDER — CEFAZOLIN SODIUM-DEXTROSE 2-4 GM/100ML-% IV SOLN
2.0000 g | INTRAVENOUS | Status: AC
  Administered 2020-06-30: 2 g via INTRAVENOUS

## 2020-06-30 MED ORDER — SODIUM CHLORIDE 0.9% FLUSH: 3.0000 mL | Freq: Two times a day (BID) | INTRAVENOUS | Status: DC

## 2020-06-30 MED ORDER — LIDOCAINE-EPINEPHRINE 1 %-1:100000 IJ SOLN
INTRAMUSCULAR | Status: DC | PRN
  Administered 2020-06-30 (×2): 20 mL

## 2020-06-30 MED ORDER — CHLORHEXIDINE GLUCONATE CLOTH 2 % EX PADS: 6.0000 | MEDICATED_PAD | Freq: Once | CUTANEOUS | Status: DC

## 2020-06-30 MED ORDER — SCOPOLAMINE 1 MG/3DAYS TD PT72
MEDICATED_PATCH | TRANSDERMAL | Status: AC
  Filled 2020-06-30: qty 1

## 2020-06-30 MED ORDER — PROPOFOL 10 MG/ML IV BOLUS
INTRAVENOUS | Status: DC | PRN
  Administered 2020-06-30: 120 mg via INTRAVENOUS
  Administered 2020-06-30: 30 mg via INTRAVENOUS

## 2020-06-30 MED ORDER — LACTATED RINGERS IV SOLN: INTRAVENOUS | Status: DC

## 2020-06-30 MED ORDER — CEPHALEXIN 500 MG PO CAPS: 500.0000 mg | ORAL_CAPSULE | Freq: Four times a day (QID) | ORAL | 0 refills | Status: AC

## 2020-06-30 MED ORDER — LACTATED RINGERS IV SOLN
INTRAVENOUS | Status: AC | PRN
  Administered 2020-06-30: 1000 mL

## 2020-06-30 MED ORDER — FENTANYL CITRATE (PF) 100 MCG/2ML IJ SOLN: 25.0000 ug | INTRAMUSCULAR | Status: DC | PRN

## 2020-06-30 MED ORDER — ACETAMINOPHEN 325 MG RE SUPP: 650.0000 mg | RECTAL | Status: DC | PRN

## 2020-06-30 SURGICAL SUPPLY — 58 items
ADH SKN CLS APL DERMABOND .7 (GAUZE/BANDAGES/DRESSINGS) ×2
BAG DECANTER FOR FLEXI CONT (MISCELLANEOUS) ×1 IMPLANT
BINDER ABDOMINAL  9 SM 30-45 (SOFTGOODS)
BINDER ABDOMINAL 10 UNV 27-48 (MISCELLANEOUS) IMPLANT
BINDER ABDOMINAL 12 SM 30-45 (SOFTGOODS) IMPLANT
BINDER ABDOMINAL 9 SM 30-45 (SOFTGOODS) IMPLANT
BINDER BREAST LRG (GAUZE/BANDAGES/DRESSINGS) IMPLANT
BINDER BREAST MEDIUM (GAUZE/BANDAGES/DRESSINGS) ×1 IMPLANT
BINDER BREAST XLRG (GAUZE/BANDAGES/DRESSINGS) IMPLANT
BINDER BREAST XXLRG (GAUZE/BANDAGES/DRESSINGS) IMPLANT
BLADE HEX COATED 2.75 (ELECTRODE) ×2 IMPLANT
BLADE SURG 15 STRL LF DISP TIS (BLADE) ×1 IMPLANT
BLADE SURG 15 STRL SS (BLADE) ×4
BNDG GAUZE ELAST 4 BULKY (GAUZE/BANDAGES/DRESSINGS) ×4 IMPLANT
CANISTER SUCT 1200ML W/VALVE (MISCELLANEOUS) ×2 IMPLANT
COVER BACK TABLE 60X90IN (DRAPES) ×2 IMPLANT
COVER MAYO STAND STRL (DRAPES) ×2 IMPLANT
COVER WAND RF STERILE (DRAPES) IMPLANT
DECANTER SPIKE VIAL GLASS SM (MISCELLANEOUS) IMPLANT
DERMABOND ADVANCED (GAUZE/BANDAGES/DRESSINGS) ×2
DERMABOND ADVANCED .7 DNX12 (GAUZE/BANDAGES/DRESSINGS) IMPLANT
DRAPE LAPAROSCOPIC ABDOMINAL (DRAPES) ×2 IMPLANT
DRSG PAD ABDOMINAL 8X10 ST (GAUZE/BANDAGES/DRESSINGS) ×4 IMPLANT
ELECT BLADE 4.0 EZ CLEAN MEGAD (MISCELLANEOUS) ×2
ELECT REM PT RETURN 9FT ADLT (ELECTROSURGICAL) ×2
ELECTRODE BLDE 4.0 EZ CLN MEGD (MISCELLANEOUS) IMPLANT
ELECTRODE REM PT RTRN 9FT ADLT (ELECTROSURGICAL) ×1 IMPLANT
GLOVE SURG ENC MOIS LTX SZ6.5 (GLOVE) ×4 IMPLANT
GOWN STRL REUS W/ TWL LRG LVL3 (GOWN DISPOSABLE) ×2 IMPLANT
GOWN STRL REUS W/TWL LRG LVL3 (GOWN DISPOSABLE) ×10
LINER CANISTER 1000CC FLEX (MISCELLANEOUS) ×3 IMPLANT
NDL SAFETY ECLIPSE 18X1.5 (NEEDLE) ×1 IMPLANT
NEEDLE HYPO 18GX1.5 SHARP (NEEDLE)
NS IRRIG 1000ML POUR BTL (IV SOLUTION) ×1 IMPLANT
PACK BASIN DAY SURGERY FS (CUSTOM PROCEDURE TRAY) ×2 IMPLANT
PAD ALCOHOL SWAB (MISCELLANEOUS) ×1 IMPLANT
PENCIL SMOKE EVACUATOR (MISCELLANEOUS) ×2 IMPLANT
SLEEVE SCD COMPRESS KNEE MED (STOCKING) ×2 IMPLANT
SPONGE LAP 18X18 RF (DISPOSABLE) ×3 IMPLANT
STRIP SUTURE WOUND CLOSURE 1/2 (MISCELLANEOUS) ×2 IMPLANT
SUT MNCRL AB 4-0 PS2 18 (SUTURE) ×2 IMPLANT
SUT MON AB 3-0 SH 27 (SUTURE) ×4
SUT MON AB 3-0 SH27 (SUTURE) IMPLANT
SUT MON AB 5-0 PS2 18 (SUTURE) ×4 IMPLANT
SUT VIC AB 3-0 SH 27 (SUTURE)
SUT VIC AB 3-0 SH 27X BRD (SUTURE) IMPLANT
SUT VICRYL 4-0 PS2 18IN ABS (SUTURE) IMPLANT
SYR 50ML LL SCALE MARK (SYRINGE) ×2 IMPLANT
SYR BULB IRRIG 60ML STRL (SYRINGE) ×2 IMPLANT
SYR CONTROL 10ML LL (SYRINGE) ×1 IMPLANT
SYR TB 1ML LL NO SAFETY (SYRINGE) ×2 IMPLANT
TOWEL GREEN STERILE FF (TOWEL DISPOSABLE) ×4 IMPLANT
TRAY DSU PREP LF (CUSTOM PROCEDURE TRAY) ×2 IMPLANT
TUBE CONNECTING 20X1/4 (TUBING) ×2 IMPLANT
TUBING INFILTRATION IT-10001 (TUBING) ×1 IMPLANT
TUBING SET GRADUATE ASPIR 12FT (MISCELLANEOUS) ×2 IMPLANT
UNDERPAD 30X36 HEAVY ABSORB (UNDERPADS AND DIAPERS) ×4 IMPLANT
YANKAUER SUCT BULB TIP NO VENT (SUCTIONS) ×2 IMPLANT

## 2020-06-30 NOTE — Op Note (Signed)
DATE OF OPERATION: 06/30/2020  LOCATION: [facility]  PREOPERATIVE DIAGNOSIS: Asymmetry of breasts with fat necrosis.  POSTOPERATIVE DIAGNOSIS: Same  PROCEDURE:  1. Excision of of left breast, fat necrosis, skin and soft tissue 2 x 7 cm 2. Excision of right breast fat necrosis, skin and soft tissue 1 x 4 cm  SURGEON: Jania Steinke Sanger Daymon Hora, DO  ASSISTANT: Roetta Sessions, PA  EBL: 5 cc  CONDITION: Stable  COMPLICATIONS: None  INDICATION: The patient, Brenda [name], is a 70 y.o. female born on [birth year], is here for treatment of breast asymmetry and fat necrosis.   PROCEDURE DETAILS:  The patient was seen prior to surgery and marked.  The IV antibiotics were given. The patient was taken to the operating room and given a general anesthetic. A standard time out was performed and all information was confirmed by those in the room. SCDs were placed.   The breast was prepped and draped.  The local with epinephrine was placed at all the incision sites.  Right:  Tumescent was placed in the lateral breast area.  Liposuction was done laterally (100 cc).  The knife cannula was inserted to break up the fat necrosis that was in the lateral central breast.  The #10 blade was used to excise the lateral 1 x 4 cm area of fat necrosis, soft tissue and skin.  The bovie was used to obtain hemostasis.  The skin was closed with the 3-0 and 4-0 Monocryl.    Left: Tumescent was placed in the lateral breast area.  Liposuction was done laterally (150 cc).  The #10 blade was used to excise the lateral 2 x 7 cm area of fat necrosis, soft tissue and skin.  The bovie was used to obtain hemostasis.  The skin was closed with the 3-0 and 4-0 Monocryl.     The patient was allowed to wake up and taken to recovery room in stable condition at the end of the case. The family was notified at the end of the case.   The advanced practice practitioner (APP) assisted throughout the case.  The APP was  essential in retraction and counter traction when needed to make the case progress smoothly.  This retraction and assistance made it possible to see the tissue plans for the procedure.  The assistance was needed for blood control, tissue re-approximation and assisted with closure of the incision site.

## 2020-06-30 NOTE — Discharge Instructions (Signed)
INSTRUCTIONS FOR AFTER SURGERY   You will likely have some questions about what to expect following your operation.  The following information will help you and your family understand what to expect when you are discharged from the hospital.  Following these guidelines will help ensure a smooth recovery and reduce risks of complications.  Postoperative instructions include information on: diet, wound care, medications and physical activity.  AFTER SURGERY Expect to go home after the procedure.  In some cases, you may need to spend one night in the hospital for observation.  DIET This surgery does not require a specific diet.  However, I have to mention that the healthier you eat the better your body can start healing. It is important to increasing your protein intake.  This means limiting the foods with added sugar.  Focus on fruits and vegetables and some meat. It is very important to drink water after your surgery.  If your urine is bright yellow, then it is concentrated, and you need to drink more water.  As a general rule after surgery, you should have 8 ounces of water every hour while awake.  If you find you are persistently nauseated or unable to take in liquids let us know.  NO TOBACCO USE or EXPOSURE.  This will slow your healing process and increase the risk of a wound.  WOUND CARE If you don't have a drain: You can shower the day after surgery.  Use fragrance free soap.  Dial, [place], [place] and Cetaphil are usually mild on the skin.  If you have steri-strips / tape directly attached to your skin leave them in place. It is OK to get these wet.  No baths, pools or hot tubs for two weeks. We close your incision to leave the smallest and best-looking scar. No ointment or creams on your incisions until given the go ahead.  Especially not Neosporin (Too many skin reactions with this one).  A few weeks after surgery you can use Mederma and start massaging the scar. We ask you to wear your binder or  sports bra for the first 6 weeks around the clock, including while sleeping. This provides added comfort and helps reduce the fluid accumulation at the surgery site.  ACTIVITY No heavy lifting until cleared by the doctor.  It is OK to walk and climb stairs. In fact, moving your legs is very important to decrease your risk of a blood clot.  It will also help keep you from getting deconditioned.  Every 1 to 2 hours get up and walk for 5 minutes. This will help with a quicker recovery back to normal.  Let pain be your guide so you don't do too much.  NO, you cannot do the spring cleaning and don't plan on taking care of anyone else.  This is your time for TLC.   WORK Everyone returns to work at different times. As a rough guide, most people take at least 1 - 2 weeks off prior to returning to work. If you need documentation for your job, bring the forms to your postoperative follow up visit.  DRIVING Arrange for someone to bring you home from the hospital.  You may be able to drive a few days after surgery but not while taking any narcotics or valium.  BOWEL MOVEMENTS Constipation can occur after anesthesia and while taking pain medication.  It is important to stay ahead for your comfort.  We recommend taking Milk of Magnesia (2 tablespoons; twice a day) while taking  the pain pills.  SEROMA This is fluid your body tried to put in the surgical site.  This is normal but if it creates excessive pain and swelling let us know.  It usually decreases in a few weeks.  MEDICATIONS and PAIN CONTROL At your preoperative visit for you history and physical you were given the following medications: 1. An antibiotic: Start this medication when you get home and take according to the instructions on the bottle. 2. Zofran 4 mg:  This is to treat nausea and vomiting.  You can take this every 6 hours as needed and only if needed. 3. Norco (hydrocodone/acetaminophen) 5/325 mg:  This is only to be used after you have  taken the motrin or the tylenol. Every 8 hours as needed. Over the counter Medication to take: 4. Ibuprofen (Motrin) 600 mg:  Take this every 6 hours.  If you have additional pain then take 500 mg of the tylenol.  Only take the Norco after you have tried these two. 5. Miralax or stool softener of choice: Take this according to the bottle if you take the [place] Call your surgeon's office if any of the following occur: . Fever 101 degrees F or greater . Excessive bleeding or fluid from the incision site. . Pain that increases over time without aid from the medications . Redness, warmth, or pus draining from incision sites . Persistent nausea or inability to take in liquids . Severe misshapen area that underwent the operation.  May take Tylenol after 4pm, if needed.     Post Anesthesia Home Care Instructions  Activity: Get plenty of rest for the remainder of the day. A responsible individual must stay with you for 24 hours following the procedure.  For the next 24 hours, DO NOT: -Drive a car -Paediatric nurse -Drink alcoholic beverages -Take any medication unless instructed by your physician -Make any legal decisions or sign important papers.  Meals: Start with liquid foods such as gelatin or soup. Progress to regular foods as tolerated. Avoid greasy, spicy, heavy foods. If nausea and/or vomiting occur, drink only clear liquids until the nausea and/or vomiting subsides. Call your physician if vomiting continues.  Special Instructions/Symptoms: Your throat may feel dry or sore from the anesthesia or the breathing tube placed in your throat during surgery. If this causes discomfort, gargle with warm salt water. The discomfort should disappear within 24 hours.  If you had a scopolamine patch placed behind your ear for the management of post- operative nausea and/or vomiting:  1. The medication in the patch is effective for 72 hours, after which it should be removed.   Wrap patch in a tissue and discard in the trash. Wash hands thoroughly with soap and water. 2. You may remove the patch earlier than 72 hours if you experience unpleasant side effects which may include dry mouth, dizziness or visual disturbances. 3. Avoid touching the patch. Wash your hands with soap and water after contact with the patch.

## 2020-06-30 NOTE — Transfer of Care (Signed)
Immediate Anesthesia Transfer of Care Note  Patient: Brenda [name]  Procedure(s) Performed: Excision of right breast fat necrosis and left inferior breast (Bilateral Breast) Bilateral liposuction of lateral breast (Bilateral Breast)  Patient Location: PACU  Anesthesia Type:General  Level of Consciousness: sedated  Airway & Oxygen Therapy: Patient Spontanous Breathing and Patient connected to face mask oxygen  Post-op Assessment: Report given to RN and Post -op Vital signs reviewed and stable  Post vital signs: Reviewed and stable  Last Vitals:  Vitals Value Taken Time  BP    Temp    Pulse    Resp 17 06/30/20 1228  SpO2    Vitals shown include unvalidated device data.  Last Pain:  Vitals:   06/30/20 0936  TempSrc: Oral  PainSc: 0-No pain         Complications: No complications documented.

## 2020-06-30 NOTE — Anesthesia Procedure Notes (Signed)
Procedure Name: LMA Insertion Date/Time: 06/30/2020 11:08 AM Performed by: Willa Frater, CRNA Pre-anesthesia Checklist: Patient identified, Emergency Drugs available, Suction available and Patient being monitored Patient Re-evaluated:Patient Re-evaluated prior to induction Oxygen Delivery Method: Circle system utilized Preoxygenation: Pre-oxygenation with 100% oxygen Induction Type: IV induction Ventilation: Mask ventilation without difficulty LMA: LMA inserted LMA Size: 4.0 Number of attempts: 1 Airway Equipment and Method: Bite block Placement Confirmation: positive ETCO2 Tube secured with: Tape Dental Injury: Teeth and Oropharynx as per pre-operative assessment

## 2020-06-30 NOTE — Interval H&P Note (Signed)
History and Physical Interval Note:  06/30/2020 10:29 AM  Brenda [name]  has presented today for surgery, with the diagnosis of post bilateral breast reduction.  The various methods of treatment have been discussed with the patient and family. After consideration of risks, benefits and other options for treatment, the patient has consented to  Procedure(s) with comments: Excision of right breast fat necrosis and left inferior breast (Bilateral) - 1.5 hours total, please Bilateral liposuction of lateral breast (Bilateral) as a surgical intervention.  The patient's history has been reviewed, patient examined, no change in status, stable for surgery.  I have reviewed the patient's chart and labs.  Questions were answered to the patient's satisfaction.     [name]

## 2020-06-30 NOTE — Anesthesia Postprocedure Evaluation (Signed)
Anesthesia Post Note  Patient: Brenda [name]  Procedure(s) Performed: Excision of right breast fat necrosis and left inferior breast (Bilateral Breast) Bilateral liposuction of lateral breast (Bilateral Breast)     Patient location during evaluation: PACU Anesthesia Type: General Level of consciousness: awake and alert and oriented Pain management: pain level controlled Vital Signs Assessment: post-procedure vital signs reviewed and stable Respiratory status: spontaneous breathing, nonlabored ventilation and respiratory function stable Cardiovascular status: blood pressure returned to baseline Postop Assessment: no apparent nausea or vomiting Anesthetic complications: no   No complications documented.  Last Vitals:  Vitals:   06/30/20 1330 06/30/20 1352  BP: (!) 157/87 (!) 143/85  Pulse: 70 71  Resp: 15 16  Temp:  36.5 C  SpO2: 99% 100%    Last Pain:  Vitals:   06/30/20 1337  TempSrc:   PainSc: [place]

## 2020-06-30 NOTE — Telephone Encounter (Signed)
Patient just finished up surgery and said she think she misunderstood Matt because she was already on an antibiotic so she took it before her surgery because of something with her tooth. If she needs to be on an antibiotic, more will need to be called in. Please call her to advise. She uses [facility] in [place]. Their number [phone]

## 2020-06-30 NOTE — Telephone Encounter (Signed)
Called patient and advised Matt had sent in her prescription of Keflex to her pharmacy.

## 2020-07-01 ENCOUNTER — Encounter (HOSPITAL_BASED_OUTPATIENT_CLINIC_OR_DEPARTMENT_OTHER): Payer: Self-pay | Admitting: Plastic Surgery

## 2020-07-01 LAB — SURGICAL PATHOLOGY

## 2020-07-08 DIAGNOSIS — N3010 Interstitial cystitis (chronic) without hematuria: Secondary | ICD-10-CM | POA: Diagnosis not present

## 2020-07-12 ENCOUNTER — Encounter: Payer: Self-pay | Admitting: Plastic Surgery

## 2020-07-12 ENCOUNTER — Ambulatory Visit (INDEPENDENT_AMBULATORY_CARE_PROVIDER_SITE_OTHER): Payer: PPO | Admitting: Plastic Surgery

## 2020-07-12 ENCOUNTER — Other Ambulatory Visit: Payer: Self-pay

## 2020-07-12 VITALS — BP 134/85 | HR 70

## 2020-07-12 DIAGNOSIS — N1831 Chronic kidney disease, stage 3a: Secondary | ICD-10-CM | POA: Diagnosis not present

## 2020-07-12 DIAGNOSIS — I129 Hypertensive chronic kidney disease with stage 1 through stage 4 chronic kidney disease, or unspecified chronic kidney disease: Secondary | ICD-10-CM | POA: Diagnosis not present

## 2020-07-12 DIAGNOSIS — N62 Hypertrophy of breast: Secondary | ICD-10-CM

## 2020-07-12 DIAGNOSIS — Z9889 Other specified postprocedural states: Secondary | ICD-10-CM

## 2020-07-12 NOTE — Progress Notes (Signed)
The patient is a 70 year old female here for follow-up after undergoing revision of her breast reduction.  She is happy today.  She feels like it really made a nice difference for her.  She has a little bit of swelling and bruising as expected.  No sign of infection or seroma.

## 2020-07-22 ENCOUNTER — Telehealth: Payer: Self-pay

## 2020-07-22 NOTE — Telephone Encounter (Signed)
Patient was massaging scar tissue on her right breast yesterday, and notes that a stitch came out that hadn't dissolved, leaving a small hole. There was a little bleeding and she cleaned it up and applied a steri-strip to it. The area is still sore. She would like to know if  She needs to be seen sooner if she can wait until her appointment on Tuesday, 07/26/2020.

## 2020-07-22 NOTE — Telephone Encounter (Signed)
Returned patients call. Advised her to leave steri strips alone, apply with Vaseline and cover with gauze on the are that has opened. Reminded of follow up appointment on 07/26/20 at 11:20. Patient understood and agreed.

## 2020-07-26 ENCOUNTER — Ambulatory Visit (INDEPENDENT_AMBULATORY_CARE_PROVIDER_SITE_OTHER): Payer: PPO | Admitting: Surgical

## 2020-07-26 ENCOUNTER — Encounter: Payer: Self-pay | Admitting: Surgical

## 2020-07-26 ENCOUNTER — Other Ambulatory Visit: Payer: Self-pay

## 2020-07-26 ENCOUNTER — Encounter: Payer: PPO | Admitting: Surgical

## 2020-07-26 VITALS — BP 117/78 | HR 70

## 2020-07-26 DIAGNOSIS — Z9889 Other specified postprocedural states: Secondary | ICD-10-CM

## 2020-07-26 NOTE — Progress Notes (Signed)
Patient is a 70 year old female here for follow-up after undergoing revision of her breast reduction with Dr. Marla Roe on 06/30/2020.  Patient reports she is overall doing well, however she reports she was massaging bilateral breast incisions and reports that she noticed while she was massaging with firm pressure that the incision opened along the left side.  She has placed Steri-Strips over this.  Patient has some questions about skin care products.  Chaperone present on exam On exam bilateral NAC's are viable with good color.  Right breast incision is intact and healing nicely.  Left breast incision mostly intact with Steri-Strips in place along the midline aspect of the incision.  After removal of the Steri-Strips a wound that was approximately 0.5 x 1 cm is noted.  It is approximately 0.5 cm deep.  There is no surrounding cellulitic changes.  No purulent drainage.  Some serosanguineous drainage is noted.  Recommend continue her compressive garment.  Recommend avoiding massaging the incisions at this time.  Recommend continue her compressive garment.  Recommend Vaseline and gauze daily.  Recommend following up in 2 weeks for reevaluation.  There is no sign of infection, seroma, hematoma.  We discussed some various skin care products, we did discuss laser as well.  She is going to think about this  Pictures were obtained of the patient and placed in the chart with the patient's or guardian's permission.

## 2020-08-05 DIAGNOSIS — N3010 Interstitial cystitis (chronic) without hematuria: Secondary | ICD-10-CM | POA: Diagnosis not present

## 2020-08-09 ENCOUNTER — Other Ambulatory Visit: Payer: Self-pay

## 2020-08-09 ENCOUNTER — Ambulatory Visit (INDEPENDENT_AMBULATORY_CARE_PROVIDER_SITE_OTHER): Payer: PPO | Admitting: Surgical

## 2020-08-09 DIAGNOSIS — E785 Hyperlipidemia, unspecified: Secondary | ICD-10-CM | POA: Diagnosis not present

## 2020-08-09 DIAGNOSIS — R7301 Impaired fasting glucose: Secondary | ICD-10-CM | POA: Diagnosis not present

## 2020-08-09 DIAGNOSIS — Z9889 Other specified postprocedural states: Secondary | ICD-10-CM

## 2020-08-09 NOTE — Progress Notes (Signed)
Patient is a 70 year old female here for follow-up after undergoing revision of her breast reduction with Dr. Marla Roe on 06/30/2020.  She reports she overall is doing well.  She reports he has been applying Vaseline to the breast wound.  She is unsure if it has healed or not.  She has continued to wear compressive garments.  She has avoided heavy lifting.  She has some questions about skin care and I serums.  Chaperone present on exam On exam bilateral breast incisions are intact.  Bilateral NAC's are viable.  There are no wounds noted.  Bilateral breasts are fairly symmetric, left is slightly larger than the right but to a small degree.  There is no erythema.  No subcutaneous fluid collections are noted with palpation.  Recommend following up on an as-needed basis in regards to her bilateral breast reduction.  There is no sign of infection, seroma, hematoma. Recommend continue her compressive garment.  Recommend continue to avoid heavy lifting for a few more weeks. Recommend beginning to use scar cream if she would like.  We will schedule her follow-up to discuss skin care consultation/laser consultation.

## 2020-08-16 DIAGNOSIS — E559 Vitamin D deficiency, unspecified: Secondary | ICD-10-CM | POA: Diagnosis not present

## 2020-08-16 DIAGNOSIS — N1831 Chronic kidney disease, stage 3a: Secondary | ICD-10-CM | POA: Diagnosis not present

## 2020-08-16 DIAGNOSIS — Z23 Encounter for immunization: Secondary | ICD-10-CM | POA: Diagnosis not present

## 2020-08-16 DIAGNOSIS — K589 Irritable bowel syndrome without diarrhea: Secondary | ICD-10-CM | POA: Diagnosis not present

## 2020-08-16 DIAGNOSIS — N3010 Interstitial cystitis (chronic) without hematuria: Secondary | ICD-10-CM | POA: Diagnosis not present

## 2020-08-16 DIAGNOSIS — M797 Fibromyalgia: Secondary | ICD-10-CM | POA: Diagnosis not present

## 2020-08-16 DIAGNOSIS — M8580 Other specified disorders of bone density and structure, unspecified site: Secondary | ICD-10-CM | POA: Diagnosis not present

## 2020-08-16 DIAGNOSIS — Z0000 Encounter for general adult medical examination without abnormal findings: Secondary | ICD-10-CM | POA: Diagnosis not present

## 2020-08-16 DIAGNOSIS — E785 Hyperlipidemia, unspecified: Secondary | ICD-10-CM | POA: Diagnosis not present

## 2020-08-16 DIAGNOSIS — Z719 Counseling, unspecified: Secondary | ICD-10-CM

## 2020-08-16 DIAGNOSIS — I129 Hypertensive chronic kidney disease with stage 1 through stage 4 chronic kidney disease, or unspecified chronic kidney disease: Secondary | ICD-10-CM | POA: Diagnosis not present

## 2020-08-16 DIAGNOSIS — E669 Obesity, unspecified: Secondary | ICD-10-CM | POA: Diagnosis not present

## 2020-08-16 DIAGNOSIS — R0789 Other chest pain: Secondary | ICD-10-CM | POA: Diagnosis not present

## 2020-09-02 DIAGNOSIS — N3010 Interstitial cystitis (chronic) without hematuria: Secondary | ICD-10-CM | POA: Diagnosis not present

## 2020-09-20 ENCOUNTER — Encounter: Payer: Self-pay | Admitting: Plastic Surgery

## 2020-09-20 ENCOUNTER — Ambulatory Visit (INDEPENDENT_AMBULATORY_CARE_PROVIDER_SITE_OTHER): Payer: PPO | Admitting: Plastic Surgery

## 2020-09-20 ENCOUNTER — Other Ambulatory Visit: Payer: Self-pay

## 2020-09-20 DIAGNOSIS — Z719 Counseling, unspecified: Secondary | ICD-10-CM

## 2020-09-20 DIAGNOSIS — N62 Hypertrophy of breast: Secondary | ICD-10-CM

## 2020-09-20 NOTE — Progress Notes (Signed)
   Subjective:    Patient ID: Brenda [name], female    DOB: 12/02/1950, 70 y.o.   MRN: [id number]  The patient is a 70 year old female here for follow-up on her breast surgery she had revision to a breast reduction.  The fat necrosis is much better.  Her breast is much softer than it had been prior to the surgery.  She is pleased with her progress.  The incisions are healing nicely.  There is no sign of infection or seroma.     Review of Systems  Constitutional: Negative.   HENT: Negative.   Eyes: Negative.   Respiratory: Negative.   Cardiovascular: Negative.   Endocrine: Negative.   Genitourinary: Negative.   Musculoskeletal: Negative.   Neurological: Negative.   Hematological: Negative.        Objective:   Physical Exam Vitals and nursing note reviewed.  Constitutional:      Appearance: Normal appearance.  HENT:     Head: Normocephalic and atraumatic.  Cardiovascular:     Rate and Rhythm: Normal rate.     Pulses: Normal pulses.  Pulmonary:     Effort: Pulmonary effort is normal.  Skin:    General: Skin is warm.  Neurological:     General: No focal deficit present.     Mental Status: She is alert and oriented to person, place, and time.  Psychiatric:        Mood and Affect: Mood normal.        Behavior: Behavior normal.        Assessment & Plan:     ICD-10-CM   1. Symptomatic mammary hypertrophy  N62     Continue with massage and skin cream for the scar.  I think she has Mederma.  She also had a consult for skin and we went with the ZO line.  She is going to think about the laser as well. Pictures were obtained of the patient and placed in the chart with the patient's or guardian's permission.'

## 2020-09-30 DIAGNOSIS — N3010 Interstitial cystitis (chronic) without hematuria: Secondary | ICD-10-CM | POA: Diagnosis not present

## 2020-10-10 ENCOUNTER — Telehealth: Payer: Self-pay

## 2020-10-10 ENCOUNTER — Other Ambulatory Visit: Payer: Self-pay | Admitting: Internal Medicine

## 2020-10-10 DIAGNOSIS — Z1231 Encounter for screening mammogram for malignant neoplasm of breast: Secondary | ICD-10-CM

## 2020-10-10 NOTE — Telephone Encounter (Signed)
Error

## 2020-10-26 ENCOUNTER — Ambulatory Visit: Payer: PPO | Admitting: Cardiovascular Disease

## 2020-10-28 DIAGNOSIS — N3010 Interstitial cystitis (chronic) without hematuria: Secondary | ICD-10-CM | POA: Diagnosis not present

## 2020-11-10 ENCOUNTER — Other Ambulatory Visit: Payer: Self-pay

## 2020-11-10 ENCOUNTER — Ambulatory Visit (HOSPITAL_COMMUNITY): Payer: PPO | Attending: Cardiology

## 2020-11-10 ENCOUNTER — Encounter: Payer: Self-pay | Admitting: Cardiovascular Disease

## 2020-11-10 ENCOUNTER — Ambulatory Visit: Payer: PPO | Admitting: Cardiovascular Disease

## 2020-11-10 VITALS — BP 122/81 | HR 64 | Ht 60.0 in | Wt 145.8 lb

## 2020-11-10 DIAGNOSIS — R079 Chest pain, unspecified: Secondary | ICD-10-CM

## 2020-11-10 DIAGNOSIS — I700 Atherosclerosis of aorta: Secondary | ICD-10-CM | POA: Insufficient documentation

## 2020-11-10 DIAGNOSIS — I10 Essential (primary) hypertension: Secondary | ICD-10-CM | POA: Diagnosis not present

## 2020-11-10 DIAGNOSIS — R011 Cardiac murmur, unspecified: Secondary | ICD-10-CM | POA: Diagnosis not present

## 2020-11-10 DIAGNOSIS — R0602 Shortness of breath: Secondary | ICD-10-CM

## 2020-11-10 DIAGNOSIS — R7303 Prediabetes: Secondary | ICD-10-CM | POA: Diagnosis not present

## 2020-11-10 DIAGNOSIS — R0989 Other specified symptoms and signs involving the circulatory and respiratory systems: Secondary | ICD-10-CM

## 2020-11-10 DIAGNOSIS — N1831 Chronic kidney disease, stage 3a: Secondary | ICD-10-CM | POA: Diagnosis not present

## 2020-11-10 DIAGNOSIS — Z8673 Personal history of transient ischemic attack (TIA), and cerebral infarction without residual deficits: Secondary | ICD-10-CM | POA: Diagnosis not present

## 2020-11-10 DIAGNOSIS — E782 Mixed hyperlipidemia: Secondary | ICD-10-CM | POA: Diagnosis not present

## 2020-11-10 LAB — ECHOCARDIOGRAM COMPLETE
Area-P 1/2: 2.54 cm2
Height: 60 in
S' Lateral: 2.6 cm
Weight: 2332.8 oz

## 2020-11-10 NOTE — Patient Instructions (Addendum)
Medication Instructions:  No changes *If you need a refill on your cardiac medications before your next appointment, please call your pharmacy*  Testing/Procedures: Your physician has requested that you have an echocardiogram. Echocardiography is a painless test that uses sound waves to create images of your heart. It provides your doctor with information about the size and shape of your heart and how well your heart's chambers and valves are working. You may receive an ultrasound enhancing agent through an IV if needed to better visualize your heart during the echo.This procedure takes approximately one hour. There are no restrictions for this procedure. This will take place at the [address], [address].     Your cardiac CT will be scheduled at one of the below locations:   [facility] [address] [place], [place] [address] [phone]  [place] [address] [place], [place] [address] [phone]  If scheduled at [facility], please arrive at the [facility] main entrance (entrance A) of [facility] 30 minutes prior to test start time. Proceed to the [facility] Radiology Department (first floor) to check-in and test prep.  If scheduled at [facility], please arrive 15 mins early for check-in and test prep.  Please follow these instructions carefully (unless otherwise directed):   On the Night Before the Test: Be sure to Drink plenty of water. Do not consume any caffeinated/decaffeinated beverages or chocolate 12 hours prior to your test. Do not take any antihistamines 12 hours prior to your test.  On the Day of the Test: Drink plenty of water until 1 hour prior to the test. Do not eat any food 4 hours prior to the test. You may take your regular medications prior to the test.  Take metoprolol (Lopressor) 50 mg two hours prior to test. HOLD  Hydrochlorothiazide morning of the test. FEMALES- please wear underwire-free bra if available, avoid dresses & tight clothing Hold the Telmisartan the morning of test.  After the Test: Drink plenty of water. After receiving IV contrast, you may experience a mild flushed feeling. This is normal. On occasion, you may experience a mild rash up to 24 hours after the test. This is not dangerous. If this occurs, you can take Benadryl 25 mg and increase your fluid intake. If you experience trouble breathing, this can be serious. If it is severe call 911 IMMEDIATELY. If it is mild, please call our office. If you take any of these medications: Glipizide/Metformin, Avandament, Glucavance, please do not take 48 hours after completing test unless otherwise instructed.  Please allow 2-4 weeks for scheduling of routine cardiac CTs. Some insurance companies require a pre-authorization which may delay scheduling of this test.   For non-scheduling related questions, please contact the cardiac imaging nurse navigator should you have any questions/concerns: Marchia Bond, Cardiac Imaging Nurse Navigator Gordy Clement, Cardiac Imaging Nurse Navigator [facility] Direct Office Dial: [phone]   For scheduling needs, including cancellations and rescheduling, please call [place], [phone].    Follow-Up: At [facility], you and your health needs are our priority.  As part of our continuing mission to provide you with exceptional heart care, we have created designated Provider Care Teams.  These Care Teams include your primary Cardiologist (physician) and Advanced Practice Providers (APPs -  Physician Assistants and Nurse Practitioners) who all work together to provide you with the care you need, when you need it.  We recommend signing up for the patient portal called "MyChart".  Sign up information is provided on this After Visit Summary.  MyChart is used to connect with  patients for Virtual Visits (Telemedicine).  Patients are able to view lab/test results, encounter notes, upcoming appointments, etc.  Non-urgent messages can be sent to your provider as well.   To learn more about what you can do with MyChart, go to [email or web address].    Your next appointment:   Follow up with Dr. Sallyanne Kuster first available after the tests.

## 2020-11-10 NOTE — Progress Notes (Signed)
Cardiology Office Note:    Date:  11/10/2020   ID:  Brunetta Genera, DOB 10/07/1950, MRN [id number]  PCP:  Prince Solian, MD   [facility] Providers Cardiologist:  Sanda Klein, MD     Referring MD: Prince Solian, MD   Chief Complaint  Patient presents with   Shortness of Breath        Chest Pain   Brenda [name] is a 70 y.o. female who is being seen today for the evaluation of dyspnea and chest pain at the request of Avva, Ravisankar, MD.  History of Present Illness:    Brenda [name] is a 70 y.o. 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  She is a "partially" retired Therapist, sports.  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  More recently her dyspnea has worsened so now she becomes short of breath if she has to walk in the longer did not distance even on level ground (for example from her office to the cafeteria) or even just a few steps on the stairs.  If she rushes she feels chest tightness that improves when she slows down.  She often has tachycardia when she lies in bed at night with rapid palpitations that gradually subside.  She does not describe frank orthopnea or PND.  She does not have lower extremity edema and denies intermittent claudication.  He has a longstanding history of a cardiac murmur and knows that she had an echocardiogram years ago, although I cannot retrieve a record of this.  She did have a "normal" nuclear stress test in 2009 when she was  seeing Dr. Mare Ferrari (this is also not available for review).  According to the records from Dr. Elsworth Soho she had a renal arteriogram for her hypertension in 1992.  A noncontrast CT of the chest performed for lung nodules in 2019 shows mild aortic arch atherosclerosis.  On my review there is very scanty if any coronary calcification.  She does not tolerate statins due to liver function test abnormalities, but is taking ezetimibe and Repatha.  She has borderline hyperglycemia with a recent hemoglobin A1c of 5.8%.  She has asthma, but does tolerate metoprolol in a dose of 25 mg twice a day.  MRIs have shown "silent strokes" in 1996 in 2009.  Her only warning that something was wrong was memory difficulty.  Her mother has a history of mitral valve prolapse and had a heart attack at a relatively advanced age.  Her 12 year old sister has atrial fibrillation and and a pacemaker as well as a history of stroke.  Her electrocardiogram shows normal sinus rhythm with delayed R wave progression (RS transition in V5) but is otherwise a completely normal tracing.  Compared with previous ECGs in the system it is not meaningfully different.  Past Medical History:  Diagnosis Date   Anxiety    Arthritis    Bladder pain    Cervical disc disease    THINKS C 4 TO C 5   Chronic cough  followed by dr wert and ENT dr Carol Ada   Chronic kidney disease    stage IIIA   Chronic seasonal allergic rhinitis    Depression    Dyspnea    with exertion    Fibromyalgia    Frequency of urination    GERD (gastroesophageal reflux disease)    Heart murmur    per pt pcp only hear's murmur occasionally, asymptomatic   History of pancreatitis 1988-1989   History of panic attacks    Hyperlipidemia    Hypertension LABILE   followed by pcp   IBS (irritable bowel syndrome)    Interstitial cystitis    urologist-- dr Jeffie Pollock   Lumbar spondylosis    Migraines    Moderate persistent asthma    followed by pcp and dr wert     Multiple pulmonary nodules    pulmonologist-- dr wert   Normal cardiac stress test JULY 2009---  NORMAL   PONV (postoperative nausea and vomiting)    Remote history of stroke 1996-   MILD  W/ NO RESIDUALS   AND PER SCAN CVA  IN 2010  (INFARCTION LEFT THALAMUS)   Spasm of sphincter of Oddi 1987   02-13-2019  per pt no issues since she watches what medication's she takes   Stroke [facility])    hx of 2 strokes in past- years ago - found mri    Urethral stenosis S/P DILATIONS   Urgency of urination     Past Surgical History:  Procedure Laterality Date   ABDOMINAL HYSTERECTOMY  1988   COMPLETE   APPENDECTOMY  04/ 1986   bilateral reduction mastopexy  1988   bilateral turbinate resection  1990   nasal spreading graft   BREAST CYST EXCISION Bilateral 03/18/2019   Procedure: excision necrotic breast tissue left ;  Surgeon: Wallace Going, DO;  Location: [place];  Service: Plastics;  Laterality: Bilateral;   BREAST CYST EXCISION Bilateral 06/30/2020   Procedure: Excision of right breast fat necrosis and left inferior breast;  Surgeon: Wallace Going, DO;  Location: [place];  Service: Plastics;  Laterality: Bilateral;   BREAST REDUCTION SURGERY  10/17/2018   Procedure: bilateral breast reduction;  Surgeon: Wallace Going, DO;  Location: [place];  Service: Plastics;;  please adjust time to reflect 4 hours   CATARACT EXTRACTION W/ INTRAOCULAR LENS  IMPLANT, BILATERAL  08 and 09/ 2017   CHOLECYSTECTOMY  04/ 1986   COLONOSCOPY  02/2004, 11/2010, LAST 03-2016 ENDO DONE ALSO   2005: Normal - Dr. Sammuel Cooper 2012: Normal   CYSTO WITH HYDRODISTENSION N/A 12/17/2013   Procedure: CYSTO HYDRODISTENSION WITH INSTALLATION OF CHLOROPACTIN AND TETRACAINE;  Surgeon: Malka So, MD;  Location: [facility];  Service: Urology;  Laterality: N/A;   CYSTO WITH HYDRODISTENSION N/A 06/02/2015   Procedure: CYSTOSCOPY/HYDRODISTENSION WITH  INSTILLATION OF CLORPACTIN ;  Surgeon: [name], MD;  Location: [facility];  Service: Urology;  Laterality: N/A;   CYSTO WITH HYDRODISTENSION N/A 11/08/2016   Procedure: CYSTOSCOPY/HYDRODISTENSION INSTILLATION OF CLORPACTIN;  Surgeon: [name], MD;  Location: [facility];  Service: Urology;  Laterality: N/A;   CYSTO WITH HYDRODISTENSION N/A 03/06/2018   Procedure: CYSTOSCOPY/HYDRODISTENSION INSTILL CHLORPACTION;  Surgeon: [name], MD;  Location[facility];  Service: Urology;  Laterality: N/A;   CYSTO WITH HYDRODISTENSION N/A 04/02/2019   Procedure: CYSTOSCOPY/HYDRODISTENSION INSTILL CHLORPACTIN;  Surgeon: [name], MD;  Location: [facility];  Service: Urology;  Laterality: N/A;   CYSTO WITH HYDRODISTENSION N/A 02/17/2020   Procedure: CYSTOSCOPY/HYDRODISTENTION WITH INSTILLATION OF CHLORPACTIN;  Surgeon: [name], MD;  Location: [facility];  Service: Urology;  Laterality: N/A;  ONLY NEEDS 30 MIN   CYSTOSCOPY WITH URETHRAL DILATATION  07/12/2011   Procedure: CYSTOSCOPY WITH URETHRAL DILATATION;  Surgeon: Malka So, MD;  Location: [facility];  Service: Urology;  Laterality: N/A;  HOD AND INSTILLATION OF CHLOROPACTIN C-ARM    CYSTOSCOPY WITH URETHRAL DILATATION N/A 06/26/2012   Procedure: CYSTOSCOPY WITH URETHRAL DILATATION HYDRODISTENSION AND INSTILLATION OF CLORPACTION ;  Surgeon: Malka So, MD;  Location: [facility];  Service: Urology;  Laterality: N/A;  CYSTOSCOPY WITH URETHRAL DILATATION HYDRODISTENSION AND INSTILLATION OF CLORPACTION    CYSTOSTOMY W/ BLADDER BIOPSY  1983   DG BARIUM SWALLOW ([place] HX)  03/2016   DILATION AND CURETTAGE OF UTERUS  1987   ERCP  1988   for pancreatitis with stents   S/P SEVERE PANCREATITIS   HEMORRHOID SURGERY     LASIK Bilateral APRIL 2005   LIPOSUCTION Bilateral 10/17/2018   Procedure: LIPOSUCTION;  Surgeon: Wallace Going, DO;   Location: [place];  Service: Plastics;  Laterality: Bilateral;   LIPOSUCTION Right 03/18/2019   Procedure: excision right necrotic breast tissue with liposuction;  Surgeon: Wallace Going, DO;  Location: [place];  Service: Plastics;  Laterality: Right;   LIPOSUCTION Bilateral 06/30/2020   Procedure: Bilateral liposuction of lateral breast;  Surgeon: Wallace Going, DO;  Location: [place];  Service: Plastics;  Laterality: Bilateral;   MULTIPLE CYSTO/ HOD/ URETHRAL DILATION/ INSTILLATION CLORPACTIN  .LAST ONE 03-31-2010   NASAL SINUS SURGERY  1977   sinus cyst   REDUCTION MAMMAPLASTY Bilateral 1988   REDUCTION MAMMAPLASTY Bilateral 09/2018   REDUCTION MAMMAPLASTY Bilateral 03/2019   revision rhinoplasty  [place]   VIDEO BRONCHOSCOPY Bilateral 08/23/2016   Procedure: VIDEO BRONCHOSCOPY WITHOUT FLUORO;  Surgeon: Javier Glazier, MD;  Location: [facility];  Service: Cardiopulmonary;  Laterality: Bilateral;    Current Medications: Current Meds  Medication Sig   acetaminophen (TYLENOL) 325 MG tablet Take 500 mg by mouth every 6 (six) hours as needed (for headaches).    albuterol (VENTOLIN HFA) 108 (90 Base) MCG/ACT inhaler Inhale into the lungs every 6 (six) hours as needed for wheezing or shortness of breath.   ezetimibe (ZETIA) 10 MG tablet Take 10 mg by mouth every evening.   famotidine (PEPCID) 20 MG tablet Take 20 mg by mouth at bedtime.   FLUoxetine (PROZAC) 10 MG capsule Take 1 capsule (10 mg total) by mouth daily.   fluticasone (FLONASE) 50 MCG/ACT nasal spray Place 2 sprays into both nostrils daily.   folic acid (FOLVITE) 1 MG tablet Take 1 mg by mouth daily.   hydrochlorothiazide (MICROZIDE) 12.5 MG capsule Take 12.5 mg by mouth every morning.   LORazepam (ATIVAN) 1 MG tablet TAKE 1 TABLET EVERY 8 HOURS AS NEEDED FOR ANXIETY.    metoprolol (LOPRESSOR) 50 MG tablet Take 25 mg by mouth 2 (two) times daily.   Multiple Vitamins-Minerals (HAIR SKIN AND NAILS FORMULA PO) Take by mouth as needed.   OVER THE COUNTER MEDICATION Brain and memory power boost - takes only 1 but does not take every day   pantoprazole (PROTONIX) 40 MG tablet 40 mg daily.    Probiotic Product (  PROBIOTIC PO) Take 1 capsule by mouth every evening.    REPATHA SURECLICK 710 MG/ML SOAJ Inject SQ into the abdomen every 2 weeks Pt reports taking every 2 weeks on 02/10/20   telmisartan (MICARDIS) 80 MG tablet Take 80 mg by mouth daily.   Vitamin D, Ergocalciferol, (DRISDOL) 50000 UNITS CAPS Take 50,000 Units by mouth every 7 (seven) days.   zolpidem (AMBIEN) 5 MG tablet TAKE 1 TABLET AT BEDTIME AS NEEDED FOR INSOMNIA.   [DISCONTINUED] telmisartan-hydrochlorothiazide (MICARDIS HCT) 80-12.5 MG tablet      Allergies:   Codeine, Latex, Stadol [butorphanol tartrate], Adhesive [tape], Bupivacaine, Butorphanol tartrate, Clarithromycin, Cymbalta [duloxetine hcl], Dilaudid [hydromorphone hcl], Elavil [amitriptyline hcl], Hydrocodone, Levbid [hyoscyamine sulfate], Lisinopril, Pentazocine lactate, Percodan [oxycodone-aspirin], Trazodone and nefazodone, Vistaril [hydroxyzine hcl], and Morphine and related   Social History   Socioeconomic History   Marital status: Married    Spouse name: Not on file   Number of children: 0   Years of education: 16   Highest education level: Not on file  Occupational History   Occupation: Therapist, sports  Tobacco Use   Smoking status: Never   Smokeless tobacco: Never  Vaping Use   Vaping Use: Never used  Substance and Sexual Activity   Alcohol use: No   Drug use: No   Sexual activity: Not on file  Other Topics Concern   Not on file  Social History Narrative   Married, lives with spouse   No children   RN at [employer]   No recent travel      [place]:   Originally from [place]. Always lived in [place]. Previously has traveled  to [place], [place], [place], [place], & mostly [place]. No indoor pets currently. She does have cats in her garage. No bird, mold, or hot tub exposure. No indoor plants. Mostly wood floors. She did have her bedroom carpet taken up in Summer 2017. Has mostly blinds. No wood burning fire place. Previously enjoyed Firefighter & playing piano.    Social Determinants of Health   Financial Resource Strain: Not on file  Food Insecurity: Not on file  Transportation Needs: Not on file  Physical Activity: Not on file  Stress: Not on file  Social Connections: Not on file     Family History: The patient's family history includes Allergies in her mother; Asthma in her mother; Brain cancer in her father; Colon cancer in her cousin and maternal grandmother; Colon polyps in her father; Diabetes in her mother and sister; Heart disease in her father, mother, and sister; Irritable bowel syndrome in her mother and sister; Melanoma in her sister; Osteoarthritis in her father, mother, and sister; Stroke in her sister; Ulcerative colitis in her maternal uncle. There is no history of Stomach cancer or Rheumatologic disease.  ROS:   Please see the history of present illness.     All other systems reviewed and are negative.  EKGs/Labs/Other Studies Reviewed:    The following studies were reviewed today: Extensive notes from Dr. Elsworth Soho, multiple ECGs over the last several years, lab  EKG:  EKG is ordered today.  The ekg ordered today demonstrates sinus rhythm, delayed R wave progression to lead V5, otherwise normal tracing  Recent Labs: 02/17/2020: BUN 15; Creatinine, Ser 1.20; Hemoglobin 15.3; Potassium 4.4; Sodium 142  08/09/2020 Glucose 101, creatinine 1.2 (GFR 60), potassium 5.3, normal liver function tests, TSH 2.63 Hemoglobin A1c 5.8% Hemoglobin 13.9, platelets 240 9K Recent Lipid Panel No results found for: CHOL, TRIG, HDL, CHOLHDL, VLDL, LDLCALC,  LDLDIRECT 08/09/2020 Cholesterol 137, triglycerides  177, HDL 49, LDL 53, APO B 65  Risk Assessment/Calculations:      Physical Exam:    VS:  BP 122/81   Pulse 64   Ht 5' (1.524 m)   Wt 145 lb 12.8 oz (66.1 kg)   SpO2 98%   BMI 28.47 kg/m     Wt Readings from Last 3 Encounters:  11/10/20 145 lb 12.8 oz (66.1 kg)  06/30/20 148 lb 5.9 oz (67.3 kg)  06/15/20 151 lb 12.8 oz (68.9 kg)     GEN:  Well nourished, well developed in no acute distress HEENT: Normal NECK: No JVD; bilateral carotid bruits LYMPHATICS: No lymphadenopathy CARDIAC: RRR, no diastolic murmurs, rubs, gallops.  There is a 1-2/6 early peaking systolic ejection murmur heard abdomen down the right sternal border that seems to radiate to the neck, however the carotid bruits appear to be disproportionately louder RESPIRATORY:  Clear to auscultation without rales, wheezing or rhonchi  ABDOMEN: Soft, non-tender, non-distended MUSCULOSKELETAL:  No edema; No deformity  SKIN: Warm and dry NEUROLOGIC:  Alert and oriented x 3 PSYCHIATRIC:  Normal affect   ASSESSMENT:    1. Shortness of breath   2. Exertional chest pain   3. Murmur, cardiac   4. Bilateral carotid bruits   5. Essential hypertension   6. Stage 3a chronic kidney disease ([place])   7. Prediabetes   8. Aortic atherosclerosis ([place])   9. History of ischemic stroke   10. Mixed hyperlipidemia    PLAN:    In order of problems listed above:  Exertional dyspnea/chest tightness: Symptoms are compatible with coronary insufficiency and she has numerous risk factors including hyperlipidemia, prediabetes, hypertension and family history of CAD in a female relative.  Recommend coronary CT angiography.  Hold the ARB/diuretic and take a double dose of metoprolol on the morning of the CT.  Sure she is well-hydrated when she comes in for the CT.   Murmur: We will also check an echocardiogram to assess left ventricular systolic function and the systolic murmur. Carotid bruits: These are louder than expected for a radiating  murmur, I do not see a record of a previous carotid evaluation.  Unless there is a clear explanation for the bruits on her echocardiogram, would also recommend carotid duplex ultrasonography. HTN: well controlled CKD3a: but most recent creatinine is 1.2, GFR 60.  Last October 2021 creatinine was 1.3. Prediabetes: A1c 5.8% Aortic atherosclerosis: incidentally noted on CT chest. History of ischemic strokes: remote 1196 and 2009; no sequelae. HLP: Triglycerides mildly elevated, but excellent LDL on current regimen.  Statin intolerant due to liver abnormalities.  On Repatha.        Medication Adjustments/Labs and Tests Ordered: Current medicines are reviewed at length with the patient today.  Concerns regarding medicines are outlined above.  Orders Placed This Encounter  Procedures   CT CORONARY MORPH W/CTA COR W/SCORE W/CA W/CM &/OR WO/CM   Basic metabolic panel   EKG 94-WNIO   ECHOCARDIOGRAM COMPLETE   No orders of the defined types were placed in this encounter.   Patient Instructions  Medication Instructions:  No changes *If you need a refill on your cardiac medications before your next appointment, please call your pharmacy*  Testing/Procedures: Your physician has requested that you have an echocardiogram. Echocardiography is a painless test that uses sound waves to create images of your heart. It provides your doctor with information about the size and shape of your heart and how well your heart's  chambers and valves are working. You may receive an ultrasound enhancing agent through an IV if needed to better visualize your heart during the echo.This procedure takes approximately one hour. There are no restrictions for this procedure. This will take place at the [address], [address].     Your cardiac CT will be scheduled at one of the below locations:   [facility] [address] [place], [place] [address] [phone]  [place] [address] [place],  [address] [phone]  If scheduled at [facility], please arrive at the [facility] main entrance (entrance A) of [facility] 30 minutes prior to test start time. Proceed to the [facility] Radiology Department (first floor) to check-in and test prep.  If scheduled at [facility], please arrive 15 mins early for check-in and test prep.  Please follow these instructions carefully (unless otherwise directed):   On the Night Before the Test: Be sure to Drink plenty of water. Do not consume any caffeinated/decaffeinated beverages or chocolate 12 hours prior to your test. Do not take any antihistamines 12 hours prior to your test.  On the Day of the Test: Drink plenty of water until 1 hour prior to the test. Do not eat any food 4 hours prior to the test. You may take your regular medications prior to the test.  Take metoprolol (Lopressor) 50 mg two hours prior to test. HOLD Hydrochlorothiazide morning of the test. FEMALES- please wear underwire-free bra if available, avoid dresses & tight clothing Hold the Telmisartan the morning of test.  After the Test: Drink plenty of water. After receiving IV contrast, you may experience a mild flushed feeling. This is normal. On occasion, you may experience a mild rash up to 24 hours after the test. This is not dangerous. If this occurs, you can take Benadryl 25 mg and increase your fluid intake. If you experience trouble breathing, this can be serious. If it is severe call 911 IMMEDIATELY. If it is mild, please call our office. If you take any of these medications: Glipizide/Metformin, Avandament, Glucavance, please do not take 48 hours after completing test unless otherwise instructed.  Please allow 2-4 weeks for scheduling of routine cardiac CTs. Some insurance companies require a pre-authorization which may delay scheduling of this test.    For non-scheduling related questions, please contact the cardiac imaging nurse navigator should you have any questions/concerns: Marchia Bond, Cardiac Imaging Nurse Navigator Gordy Clement, Cardiac Imaging Nurse Navigator [facility] Direct Office Dial: [phone]   For scheduling needs, including cancellations and rescheduling, please call [place], [phone].    Follow-Up: At [facility], you and your health needs are our priority.  As part of our continuing mission to provide you with exceptional heart care, we have created designated Provider Care Teams.  These Care Teams include your primary Cardiologist (physician) and Advanced Practice Providers (APPs -  Physician Assistants and Nurse Practitioners) who all work together to provide you with the care you need, when you need it.  We recommend signing up for the patient portal called "MyChart".  Sign up information is provided on this After Visit Summary.  MyChart is used to connect with patients for Virtual Visits (Telemedicine).  Patients are able to view lab/test results, encounter notes, upcoming appointments, etc.  Non-urgent messages can be sent to your provider as well.   To learn more about what you  can do with MyChart, go to [email or web address].    Your next appointment:   Follow up with Dr. Sallyanne Kuster first available after the tests.    Signed, Sanda Klein, MD  11/10/2020 12:34 PM    [place]

## 2020-11-11 ENCOUNTER — Encounter: Payer: Self-pay | Admitting: Plastic Surgery

## 2020-11-11 ENCOUNTER — Ambulatory Visit (INDEPENDENT_AMBULATORY_CARE_PROVIDER_SITE_OTHER): Payer: PPO | Admitting: Plastic Surgery

## 2020-11-11 ENCOUNTER — Other Ambulatory Visit: Payer: PPO | Admitting: *Deleted

## 2020-11-11 DIAGNOSIS — R0602 Shortness of breath: Secondary | ICD-10-CM

## 2020-11-11 DIAGNOSIS — L989 Disorder of the skin and subcutaneous tissue, unspecified: Secondary | ICD-10-CM | POA: Insufficient documentation

## 2020-11-11 NOTE — Progress Notes (Signed)
We were reviewing the patient's facial product line when she pointed out a changing skin lesion on the dorsum of her nose.  This is very concerning for the start of a basal cell carcinoma.  I want to see her back in 2 weeks and if it still there we will need to do a biopsy.  She has a strong history of melanoma in her family.

## 2020-11-12 LAB — BASIC METABOLIC PANEL
BUN/Creatinine Ratio: 17 (ref 12–28)
BUN: 21 mg/dL (ref 8–27)
CO2: 26 mmol/L (ref 20–29)
Calcium: 10 mg/dL (ref 8.7–10.3)
Chloride: 101 mmol/L (ref 96–106)
Creatinine, Ser: 1.27 mg/dL — ABNORMAL HIGH (ref 0.57–1.00)
Glucose: 91 mg/dL (ref 65–99)
Potassium: 5 mmol/L (ref 3.5–5.2)
Sodium: 139 mmol/L (ref 134–144)
eGFR: 46 mL/min/{1.73_m2} — ABNORMAL LOW (ref >59)

## 2020-11-12 LAB — SPECIMEN STATUS REPORT

## 2020-11-21 ENCOUNTER — Telehealth (HOSPITAL_COMMUNITY): Payer: Self-pay | Admitting: *Deleted

## 2020-11-21 NOTE — Telephone Encounter (Signed)
Reaching out to patient to offer assistance regarding upcoming cardiac imaging study; pt verbalizes understanding of appt date/time, parking situation and where to check in, pre-test NPO status and medications ordered; name and call back number provided for further questions should they arise  Gordy Clement RN Navigator Cardiac Imaging [facility] [phone] office [phone] cell  Patient to take '50mg'$  metoprolol tartrate hours priors to cardiac CT scan. Patient aware that she will need a driver if she take ativan in preparation to the test.

## 2020-11-22 ENCOUNTER — Other Ambulatory Visit: Payer: Self-pay

## 2020-11-22 ENCOUNTER — Ambulatory Visit: Payer: PPO | Admitting: Plastic Surgery

## 2020-11-22 ENCOUNTER — Encounter: Payer: Self-pay | Admitting: Plastic Surgery

## 2020-11-22 DIAGNOSIS — L989 Disorder of the skin and subcutaneous tissue, unspecified: Secondary | ICD-10-CM

## 2020-11-22 DIAGNOSIS — Z719 Counseling, unspecified: Secondary | ICD-10-CM

## 2020-11-22 NOTE — Progress Notes (Signed)
   Subjective:    Patient ID: Brenda [name], female    DOB: December 22, 1950, 71 y.o.   MRN: [id number]  The patient is a 70 year old female here for evaluation of her nose.  When I saw her last she had an little ulceration on the dorsum.  The ulceration has cleared.  I am less worried about basal cell carcinoma.  She now has a mass underneath it.  This may be a cyst.  Just a little different that it started with the skin.  No sign of infection and the skin is completely healed.     Review of Systems  Constitutional: Negative.   HENT: Negative.    Eyes: Negative.   Respiratory: Negative.    Cardiovascular: Negative.   Gastrointestinal: Negative.   Endocrine: Negative.   Genitourinary: Negative.   Neurological: Negative.   Hematological: Negative.       Objective:   Physical Exam Vitals and nursing note reviewed.  Constitutional:      Appearance: Normal appearance.  HENT:     Head: Normocephalic and atraumatic.   Cardiovascular:     Rate and Rhythm: Normal rate.     Pulses: Normal pulses.  Pulmonary:     Effort: Pulmonary effort is normal.  Skin:    Capillary Refill: Capillary refill takes less than 2 seconds.  Neurological:     General: No focal deficit present.     Mental Status: She is alert.       Assessment & Plan:     ICD-10-CM   1. Changing skin lesion  L98.9     Patient would like to go ahead and get scheduled for removal of the lesion.  We will send it to pathology because she has a strong family history of melanoma.    Pictures were obtained of the patient and placed in the chart with the patient's or guardian's permission.

## 2020-11-23 ENCOUNTER — Ambulatory Visit (HOSPITAL_COMMUNITY)
Admission: RE | Admit: 2020-11-23 | Discharge: 2020-11-23 | Disposition: A | Discharge status: Home / Self Care | Payer: PPO | Source: Ambulatory Visit | Attending: Cardiovascular Disease | Admitting: Cardiovascular Disease

## 2020-11-23 DIAGNOSIS — E7800 Pure hypercholesterolemia, unspecified: Secondary | ICD-10-CM | POA: Insufficient documentation

## 2020-11-23 DIAGNOSIS — R0602 Shortness of breath: Secondary | ICD-10-CM

## 2020-11-23 DIAGNOSIS — I10 Essential (primary) hypertension: Secondary | ICD-10-CM | POA: Diagnosis not present

## 2020-11-23 DIAGNOSIS — R0789 Other chest pain: Secondary | ICD-10-CM | POA: Diagnosis not present

## 2020-11-23 DIAGNOSIS — I700 Atherosclerosis of aorta: Secondary | ICD-10-CM | POA: Insufficient documentation

## 2020-11-23 DIAGNOSIS — R7303 Prediabetes: Secondary | ICD-10-CM | POA: Diagnosis not present

## 2020-11-23 MED ORDER — IOHEXOL 350 MG/ML SOLN
95.0000 mL | Freq: Once | INTRAVENOUS | Status: AC | PRN
  Administered 2020-11-23: 95 mL via INTRAVENOUS

## 2020-11-23 MED ORDER — NITROGLYCERIN 0.4 MG SL SUBL
0.8000 mg | SUBLINGUAL_TABLET | Freq: Once | SUBLINGUAL | Status: AC
  Administered 2020-11-23: 0.8 mg via SUBLINGUAL

## 2020-11-23 MED ORDER — NITROGLYCERIN 0.4 MG SL SUBL
SUBLINGUAL_TABLET | SUBLINGUAL | Status: AC
  Filled 2020-11-23: qty 2

## 2020-11-25 DIAGNOSIS — N3010 Interstitial cystitis (chronic) without hematuria: Secondary | ICD-10-CM | POA: Diagnosis not present

## 2020-12-02 ENCOUNTER — Other Ambulatory Visit: Payer: Self-pay

## 2020-12-02 ENCOUNTER — Ambulatory Visit
Admission: RE | Admit: 2020-12-02 | Discharge: 2020-12-02 | Disposition: A | Discharge status: Home / Self Care | Payer: PPO | Source: Ambulatory Visit | Attending: Internal Medicine | Admitting: Internal Medicine

## 2020-12-02 DIAGNOSIS — Z1231 Encounter for screening mammogram for malignant neoplasm of breast: Secondary | ICD-10-CM

## 2020-12-13 ENCOUNTER — Other Ambulatory Visit: Payer: Self-pay

## 2020-12-13 ENCOUNTER — Ambulatory Visit (INDEPENDENT_AMBULATORY_CARE_PROVIDER_SITE_OTHER): Payer: PPO | Admitting: Psychiatry

## 2020-12-13 ENCOUNTER — Encounter: Payer: Self-pay | Admitting: Psychiatry

## 2020-12-13 DIAGNOSIS — F411 Generalized anxiety disorder: Secondary | ICD-10-CM | POA: Diagnosis not present

## 2020-12-13 DIAGNOSIS — F4001 Agoraphobia with panic disorder: Secondary | ICD-10-CM

## 2020-12-13 DIAGNOSIS — F5105 Insomnia due to other mental disorder: Secondary | ICD-10-CM

## 2020-12-13 DIAGNOSIS — F330 Major depressive disorder, recurrent, mild: Secondary | ICD-10-CM | POA: Diagnosis not present

## 2020-12-13 MED ORDER — ZOLPIDEM TARTRATE 5 MG PO TABS: ORAL_TABLET | ORAL | 2 refills | Status: DC

## 2020-12-13 MED ORDER — LORAZEPAM 1 MG PO TABS: ORAL_TABLET | ORAL | 2 refills | Status: DC

## 2020-12-13 MED ORDER — FLUOXETINE HCL 10 MG PO CAPS: 10.0000 mg | ORAL_CAPSULE | Freq: Every day | ORAL | 3 refills | Status: DC
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
## 2020-12-14 ENCOUNTER — Telehealth: Payer: Self-pay

## 2020-12-14 NOTE — Telephone Encounter (Signed)
Call to pt with results of recent mammogram- per Dr. Marla Roe:  mammogram was negative for any malignancy. Patient understands the results & states she is doing well. She continues to have a raised/firm lesion on the top of her nose- which she wants to discuss further with Dr. Marla Roe on her next f/u visit on 02/24/21. She requested if we have a cancellation, she would like to be seen sooner if possible. I will forward this information to our scheduling dept. She will call for any concerns or changes.

## 2020-12-20 DIAGNOSIS — Z719 Counseling, unspecified: Secondary | ICD-10-CM

## 2020-12-23 DIAGNOSIS — N3010 Interstitial cystitis (chronic) without hematuria: Secondary | ICD-10-CM | POA: Diagnosis not present

## 2020-12-26 DIAGNOSIS — Z23 Encounter for immunization: Secondary | ICD-10-CM | POA: Diagnosis not present

## 2021-01-20 DIAGNOSIS — J3489 Other specified disorders of nose and nasal sinuses: Secondary | ICD-10-CM | POA: Diagnosis not present

## 2021-01-20 DIAGNOSIS — I129 Hypertensive chronic kidney disease with stage 1 through stage 4 chronic kidney disease, or unspecified chronic kidney disease: Secondary | ICD-10-CM | POA: Diagnosis not present

## 2021-01-20 DIAGNOSIS — R059 Cough, unspecified: Secondary | ICD-10-CM | POA: Diagnosis not present

## 2021-01-20 DIAGNOSIS — N3010 Interstitial cystitis (chronic) without hematuria: Secondary | ICD-10-CM | POA: Diagnosis not present

## 2021-01-20 DIAGNOSIS — J029 Acute pharyngitis, unspecified: Secondary | ICD-10-CM | POA: Diagnosis not present

## 2021-01-20 DIAGNOSIS — Z1152 Encounter for screening for COVID-19: Secondary | ICD-10-CM | POA: Diagnosis not present

## 2021-01-20 DIAGNOSIS — J309 Allergic rhinitis, unspecified: Secondary | ICD-10-CM | POA: Diagnosis not present

## 2021-01-20 DIAGNOSIS — R5383 Other fatigue: Secondary | ICD-10-CM | POA: Diagnosis not present

## 2021-01-20 DIAGNOSIS — B349 Viral infection, unspecified: Secondary | ICD-10-CM | POA: Diagnosis not present

## 2021-01-24 DIAGNOSIS — Z6829 Body mass index (BMI) 29.0-29.9, adult: Secondary | ICD-10-CM | POA: Diagnosis not present

## 2021-01-24 DIAGNOSIS — M47812 Spondylosis without myelopathy or radiculopathy, cervical region: Secondary | ICD-10-CM | POA: Diagnosis not present

## 2021-02-09 DIAGNOSIS — M47812 Spondylosis without myelopathy or radiculopathy, cervical region: Secondary | ICD-10-CM | POA: Diagnosis not present

## 2021-02-10 ENCOUNTER — Encounter: Payer: Self-pay | Admitting: Plastic Surgery

## 2021-02-10 ENCOUNTER — Other Ambulatory Visit: Payer: Self-pay

## 2021-02-10 ENCOUNTER — Other Ambulatory Visit (HOSPITAL_COMMUNITY)
Admission: RE | Admit: 2021-02-10 | Discharge: 2021-02-10 | Disposition: A | Discharge status: Home / Self Care | Payer: PPO | Source: Ambulatory Visit | Attending: Plastic Surgery | Admitting: Plastic Surgery

## 2021-02-10 ENCOUNTER — Ambulatory Visit: Payer: PPO | Admitting: Plastic Surgery

## 2021-02-10 VITALS — BP 112/73 | HR 67

## 2021-02-10 DIAGNOSIS — L989 Disorder of the skin and subcutaneous tissue, unspecified: Secondary | ICD-10-CM | POA: Insufficient documentation

## 2021-02-10 DIAGNOSIS — Z719 Counseling, unspecified: Secondary | ICD-10-CM

## 2021-02-10 DIAGNOSIS — L308 Other specified dermatitis: Secondary | ICD-10-CM | POA: Diagnosis not present

## 2021-02-10 NOTE — Progress Notes (Signed)
Procedure Note  Preoperative Dx: mass of nose  Postoperative Dx: Same  Procedure: Excision of mass of nose 4 mm  Anesthesia: Lidocaine 1% with 1:100,000 epinephrine  Description of Procedure: Risks and complications were explained to the patient.  Consent was confirmed and the patient understands the risks and benefits.  The potential complications and alternatives were explained and the patient consents.  The patient expressed understanding the option of not having the procedure and the risks of a scar.  Time out was called and all information was confirmed to be correct.    The area was prepped and drapped.  Lidocaine 1% with epinepherine was injected in the subcutaneous area.  After waiting several minutes for the local to take affect a #15 blade was used to incise the skin over the area of concern on the upper nose.  The tissue scissors and #15 blade was used to excise the 4 mm mass.  The skin edges were reapproximated with 6-0 Monocryl subcuticular running closure.  A dressing was applied.  The patient was given instructions on how to care for the area and a follow up appointment.  Brenda [name] tolerated the procedure well and there were no complications. The specimen was sent to pathology.

## 2021-02-15 ENCOUNTER — Other Ambulatory Visit: Payer: Self-pay | Admitting: Urology

## 2021-02-16 DIAGNOSIS — M8589 Other specified disorders of bone density and structure, multiple sites: Secondary | ICD-10-CM | POA: Diagnosis not present

## 2021-02-17 DIAGNOSIS — N3010 Interstitial cystitis (chronic) without hematuria: Secondary | ICD-10-CM | POA: Diagnosis not present

## 2021-02-21 ENCOUNTER — Other Ambulatory Visit: Payer: Self-pay

## 2021-02-21 ENCOUNTER — Encounter (HOSPITAL_BASED_OUTPATIENT_CLINIC_OR_DEPARTMENT_OTHER): Payer: Self-pay | Admitting: Urology

## 2021-02-21 NOTE — Progress Notes (Signed)
Spoke w/ via phone for pre-op interview--- pt Lab needs dos---- [employer] results------ current ekg in epic/ chart COVID test -----patient states asymptomatic no test needed Arrive at ------- 1000 on 02-28-2021 NPO after MN NO Solid Food.  Clear liquids from MN until--- 0900 Med rec completed Medications to take morning of surgery ----- lopressor, protonix, ativan Diabetic medication ----- n/a Patient instructed no nail polish to be worn day of surgery Patient instructed to bring photo id and insurance card day of surgery Patient aware to have Driver (ride ) / caregiver for 24 hours after surgery --husband, Brenda [name] Patient Special Instructions ----- asked to bring rescue inhaler Pre-Op special Istructions ----- n/a Patient verbalized understanding of instructions that were given at this phone interview. Patient denies shortness of breath, chest pain, fever, cough at this phone interview.

## 2021-02-22 ENCOUNTER — Ambulatory Visit: Payer: PPO | Admitting: Surgical

## 2021-02-22 DIAGNOSIS — L989 Disorder of the skin and subcutaneous tissue, unspecified: Secondary | ICD-10-CM

## 2021-02-22 DIAGNOSIS — Z719 Counseling, unspecified: Secondary | ICD-10-CM

## 2021-02-22 NOTE — Progress Notes (Signed)
Patient is 70 year old female here for follow-up after excision of a nose lesion with Dr. Marla Roe on 02/10/2021.  She is doing well.  This is healing well.  The pathology report has not yet resulted.  On exam everything is healing well.  We replaced the Steri-Strips.  Recommend following up as needed.  I discussed with the patient if she does not hear from our office in the next week with the pathology reports please call and inquire.  All of her questions were answered to her content.  Of note she also purchased some ZO skin care products, ZO wrinkle and texture repair and retinol skin brightener.

## 2021-02-23 DIAGNOSIS — R7301 Impaired fasting glucose: Secondary | ICD-10-CM | POA: Diagnosis not present

## 2021-02-23 DIAGNOSIS — E785 Hyperlipidemia, unspecified: Secondary | ICD-10-CM | POA: Diagnosis not present

## 2021-02-23 DIAGNOSIS — E669 Obesity, unspecified: Secondary | ICD-10-CM | POA: Diagnosis not present

## 2021-02-23 DIAGNOSIS — N3010 Interstitial cystitis (chronic) without hematuria: Secondary | ICD-10-CM | POA: Diagnosis not present

## 2021-02-23 DIAGNOSIS — R0789 Other chest pain: Secondary | ICD-10-CM | POA: Diagnosis not present

## 2021-02-23 DIAGNOSIS — M8580 Other specified disorders of bone density and structure, unspecified site: Secondary | ICD-10-CM | POA: Diagnosis not present

## 2021-02-23 DIAGNOSIS — K589 Irritable bowel syndrome without diarrhea: Secondary | ICD-10-CM | POA: Diagnosis not present

## 2021-02-23 DIAGNOSIS — N1831 Chronic kidney disease, stage 3a: Secondary | ICD-10-CM | POA: Diagnosis not present

## 2021-02-23 DIAGNOSIS — I129 Hypertensive chronic kidney disease with stage 1 through stage 4 chronic kidney disease, or unspecified chronic kidney disease: Secondary | ICD-10-CM | POA: Diagnosis not present

## 2021-02-23 DIAGNOSIS — M5030 Other cervical disc degeneration, unspecified cervical region: Secondary | ICD-10-CM | POA: Diagnosis not present

## 2021-02-23 DIAGNOSIS — E559 Vitamin D deficiency, unspecified: Secondary | ICD-10-CM | POA: Diagnosis not present

## 2021-02-23 DIAGNOSIS — F39 Unspecified mood [affective] disorder: Secondary | ICD-10-CM | POA: Diagnosis not present

## 2021-02-24 ENCOUNTER — Ambulatory Visit: Payer: PPO | Admitting: Plastic Surgery

## 2021-02-24 LAB — SURGICAL PATHOLOGY

## 2021-02-27 ENCOUNTER — Encounter: Payer: Self-pay | Admitting: Internal Medicine

## 2021-02-27 NOTE — H&P (Signed)
I have interstitial cystitis.     Cathline has increased nocturia and feels she needs cystoscopy with HOD which she has required many times in the past. She does have some pyuria and bacteria on a UA today and I will get a culture.     ALLERGIES: Codeine Derivatives - elevated liver enzymes Dilaudid TABS - Itching latex Lisinopril TABS - cough Marcaine SOLN - tachycardia and presyncope Morphine Derivatives - Itching, Hives, Spasm of sphincter of ODIE and pancreatitis Stadol SOLN - Swelling, tongue swelling and lips numb    MEDICATIONS: Metoprolol Tartrate 50 mg tablet 1 tablet PO Daily  Calcium Plus Vitamin D 1 PO Daily  Folic Acid 1 mg tablet 1 tablet PO Daily  Lorazepam 1 mg tablet 1 tablet PO Daily  Losartan Potassium  Probiotic 1 PO Daily  Prozac 10 mg capsule  Repatha Sureclick 272 mg/ml pen injector  Ventolin Hfa 1 PO Daily  Vitamin C 1 PO Daily  Vitamin D2 1,250 mcg (50,000 unit) capsule 1 capsule PO Daily  Zetia 10 mg tablet 1 tablet PO Daily     GU PSH: Cysto Bladder Ureth Biopsy - 2012 Cysto Dilate Stricture (M or F) - 2013 Cystoscopy Hydrodistention - 04/02/2019, 03/06/2018, 2018, 2013, 2011, 2010, 2010, 2008 D&C Non-OB - 1987 Hysterectomy Unilat SO - 1987 Ovary Removal Partial or Total - 2012 Subseq Female Urethral Dilation - 2019       PSH Notes: Bladder Irrigation- multiple(s)  Corneal LASIK Bilateral, bilateral breast reduction with mastopexy 1987 Dr. Dessie Coma     NON-GU PSH: Appendectomy - 2012 Breast Reduction, Bilateral - 10/14/2018, 1987 Cataract surgery, Bilateral Cholecystectomy (open) - 2012 Colonoscopy Diagnostic Colonoscopy - 2012 Endo Cholangiopancreatograph - 2012 Hemorrhoidectomy (favorite) - 2015 Needle Biopsy Liver - 2012 Neuroeltrd Stim Post Tibial - 2019, 2019, 2019, 2019, 2019, 2019, 2019, 2019, 2019, 2019, 2019, 2019 Nose Surgery (Unspecified) Remove Tonsils And Adenoids - 2012 Repair Septal Defect Suspend Breast - 1987     GU  PMH: Splitting of urinary stream (Worsening, Chronic), Repeat dilatation PRN - 2019 Nocturia - 2019 Pelvic/perineal pain - 2019 Interstitial Cystitis (w/o hematuria) - 2019, I will get her set up for a repeat HOD and clorpactin. She is well aware of the risks. 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 that year   NON-GU PMH: Cervicalgia, Neck pain, bilateral - 2017 Radiculopathy, cervical region, Cervical radiculopathy - 2017 Muscle weakness (generalized), Muscle weakness - 2017 Other muscle spasm, Muscle spasm - 2017 Paresthesia of skin, Paresthesia and pain of both upper extremities - 5366 Follicular disorder, unspecified, Folliculitis - 4403 Abrasion of unspecified hand, initial encounter, Abrasion of hand - 2015 Acute upper respiratory infection, unspecified, Upper respiratory infection with cough and congestion - 2015 Personal history of other diseases of the respiratory system, History of acute bronchitis - 2015 Vitamin D deficiency, unspecified, Vitamin D deficiency - 2015 Encounter for general adult medical examination without abnormal findings, Encounter for preventive health examination - 2015 Anxiety, Anxiety (Symptom) - 2014 Asthma, Asthma - 2014 Cerebral infarction, unspecified, Stroke Syndrome - 2014 Fibromyalgia, Fibromyalgia - 2014 Irritable bowel syndrome with diarrhea, Irritable Bowel Syndrome - 2014 Personal history  of other diseases of the circulatory system, History of hypertension - 2014 Personal history of other diseases of the nervous system and sense organs, History of migraine headaches  - 2014 Personal history of other endocrine, nutritional and metabolic disease, History of hypercholesterolemia - 2014 Personal history of other specified conditions, History of fibrocystic disease of breast - 2014 Rash and other nonspecific skin eruption, Rash - 2014 Abnormal results of liver function studies    FAMILY HISTORY: Acute Myocardial Infarction - Mother Bladder Cancer - Father colonic polyps - Father Congestive Heart Failure - Mother Coronary Artery Disease - Mother Death In The Family Father - Runs In Family Death In The Family Mother - Runs In Family Diabetes - Mother, Sister Family Health Status Number - Runs In Family fibromyalgia - Mother Glioblastoma Multiforme (Grade IV) Of The Brain - Father Heart Disease - Mother Malignant Melanoma Of The Skin - Sister Pure Hypercholesterolemia - Father skin cancer - Mother, Father Stroke Syndrome - Mother Ulcer, Gastric - Mother   SOCIAL HISTORY: Marital Status: Married Preferred Language: English; Ethnicity: Not Hispanic Or Latino; Race: White Current Smoking Status: Patient has never smoked.  Does not use smokeless tobacco. Has never drank.  Does not use drugs. Drinks 1 caffeinated drink per day. 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    REVIEW OF SYSTEMS:    GU Review Female:   Patient denies frequent urination, hard to postpone urination, burning /pain with urination, get up at night to urinate, leakage of urine, stream starts and stops, trouble starting your stream, have to strain to urinate, and being pregnant.  Gastrointestinal (Upper):   Patient denies nausea, vomiting, and indigestion/ heartburn.  Gastrointestinal (Lower):   Patient denies diarrhea and constipation.  Constitutional:   Patient denies fever, night sweats, weight loss, and fatigue.  Skin:   Patient denies skin rash/ lesion and itching.  Eyes:   Patient denies blurred vision and double vision.  Ears/ Nose/ Throat:   Patient denies sore  throat and sinus problems.  Hematologic/Lymphatic:   Patient denies swollen glands and easy bruising.  Cardiovascular:   Patient denies leg swelling and chest pains.  Respiratory:   Patient denies cough and shortness of breath.  Endocrine:   Patient denies excessive thirst.  Musculoskeletal:   Patient denies back pain and joint pain.  Neurological:   Patient denies headaches and dizziness.  Psychologic:   Patient denies depression and anxiety.   Ht 5' (1.524 m)   Wt 66.1 kg   BMI 28.46 kg/m    MULTI-SYSTEM PHYSICAL EXAMINATION:    Constitutional: Well-nourished. No physical deformities. Normally developed. Good grooming.  Respiratory: Normal breath sounds. No labored breathing, no use of accessory muscles.   Cardiovascular: Regular rate and rhythm. No murmur, no gallop.      Complexity of Data:  Records Review:   Previous Patient Records  Urine Test Review:   Urinalysis   PROCEDURES:     02/17/21 UA is clear.      ASSESSMENT:      ICD-10 Details  1 GU:   Interstitial Cystitis (w/o hematuria) - N30.10 Chronic, Worsening - I will get her scheduled for a cystoscopy and HOD.   2 NON-GU:   Bacteriuria - R82.71 Undiagnosed New Problem - She has a possible UTI today and I will get the urine cultured.    PLAN:           Schedule Return Visit/Planned Activity: Next Available Appointment - Schedule Surgery  Document Letter(s):  Created for Patient: Clinical Summary

## 2021-02-28 ENCOUNTER — Ambulatory Visit (HOSPITAL_BASED_OUTPATIENT_CLINIC_OR_DEPARTMENT_OTHER): Payer: PPO | Admitting: Certified Registered"

## 2021-02-28 ENCOUNTER — Encounter (HOSPITAL_BASED_OUTPATIENT_CLINIC_OR_DEPARTMENT_OTHER)
Admission: RE | Disposition: A | Discharge status: Home / Self Care | Payer: Self-pay | Source: Home / Self Care | Attending: Urology

## 2021-02-28 ENCOUNTER — Encounter (HOSPITAL_BASED_OUTPATIENT_CLINIC_OR_DEPARTMENT_OTHER): Payer: Self-pay | Admitting: Urology

## 2021-02-28 ENCOUNTER — Ambulatory Visit (HOSPITAL_BASED_OUTPATIENT_CLINIC_OR_DEPARTMENT_OTHER)
Admission: RE | Admit: 2021-02-28 | Discharge: 2021-02-28 | Disposition: A | Discharge status: Home / Self Care | Payer: PPO | Attending: Urology | Admitting: Urology

## 2021-02-28 DIAGNOSIS — I700 Atherosclerosis of aorta: Secondary | ICD-10-CM | POA: Diagnosis not present

## 2021-02-28 DIAGNOSIS — E782 Mixed hyperlipidemia: Secondary | ICD-10-CM | POA: Diagnosis not present

## 2021-02-28 DIAGNOSIS — F411 Generalized anxiety disorder: Secondary | ICD-10-CM | POA: Diagnosis not present

## 2021-02-28 DIAGNOSIS — N3010 Interstitial cystitis (chronic) without hematuria: Secondary | ICD-10-CM | POA: Insufficient documentation

## 2021-02-28 HISTORY — DX: Chronic kidney disease, stage 3 unspecified: N18.30

## 2021-02-28 HISTORY — PX: CYSTO WITH HYDRODISTENSION: SHX5453

## 2021-02-28 LAB — POCT I-STAT, CHEM 8
BUN: 27 mg/dL — ABNORMAL HIGH (ref 8–23)
Calcium, Ion: 1.31 mmol/L (ref 1.15–1.40)
Chloride: 102 mmol/L (ref 98–111)
Creatinine, Ser: 1.5 mg/dL — ABNORMAL HIGH (ref 0.44–1.00)
Glucose, Bld: 93 mg/dL (ref 70–99)
HCT: 45 % (ref 36.0–46.0)
Hemoglobin: 15.3 g/dL — ABNORMAL HIGH (ref 12.0–15.0)
Potassium: 4.8 mmol/L (ref 3.5–5.1)
Sodium: 137 mmol/L (ref 135–145)
TCO2: 27 mmol/L (ref 22–32)

## 2021-02-28 SURGERY — CYSTOSCOPY, WITH BLADDER HYDRODISTENSION
Anesthesia: Monitor Anesthesia Care | Site: Bladder

## 2021-02-28 MED ORDER — ONDANSETRON HCL 4 MG/2ML IJ SOLN
INTRAMUSCULAR | Status: DC | PRN
  Administered 2021-02-28: 4 mg via INTRAVENOUS

## 2021-02-28 MED ORDER — MEPERIDINE HCL 25 MG/ML IJ SOLN
INTRAMUSCULAR | Status: AC
  Filled 2021-02-28: qty 2

## 2021-02-28 MED ORDER — STERILE WATER FOR INJECTION IJ SOLN
Status: DC | PRN
  Administered 2021-02-28: 250 mL via INTRAVESICAL

## 2021-02-28 MED ORDER — SODIUM CHLORIDE 0.9% FLUSH: 3.0000 mL | INTRAVENOUS | Status: DC | PRN

## 2021-02-28 MED ORDER — SODIUM CHLORIDE 0.9 % IV SOLN: INTRAVENOUS | Status: DC

## 2021-02-28 MED ORDER — SODIUM CHLORIDE 0.9% FLUSH: 3.0000 mL | Freq: Two times a day (BID) | INTRAVENOUS | Status: DC

## 2021-02-28 MED ORDER — CIPROFLOXACIN IN D5W 400 MG/200ML IV SOLN
INTRAVENOUS | Status: AC
  Filled 2021-02-28: qty 200

## 2021-02-28 MED ORDER — MIDAZOLAM HCL 2 MG/2ML IJ SOLN
INTRAMUSCULAR | Status: AC
  Filled 2021-02-28: qty 2

## 2021-02-28 MED ORDER — PROPOFOL 10 MG/ML IV BOLUS
INTRAVENOUS | Status: AC
  Filled 2021-02-28: qty 20

## 2021-02-28 MED ORDER — FENTANYL CITRATE (PF) 100 MCG/2ML IJ SOLN
INTRAMUSCULAR | Status: DC | PRN
  Administered 2021-02-28: 25 ug via INTRAVENOUS
  Administered 2021-02-28: 50 ug via INTRAVENOUS
  Administered 2021-02-28: 25 ug via INTRAVENOUS

## 2021-02-28 MED ORDER — FENTANYL CITRATE (PF) 100 MCG/2ML IJ SOLN
25.0000 ug | INTRAMUSCULAR | Status: DC | PRN
  Administered 2021-02-28 (×2): 25 ug via INTRAVENOUS

## 2021-02-28 MED ORDER — SODIUM CHLORIDE 0.9 % IV SOLN: 250.0000 mL | INTRAVENOUS | Status: DC | PRN

## 2021-02-28 MED ORDER — FENTANYL CITRATE (PF) 100 MCG/2ML IJ SOLN
INTRAMUSCULAR | Status: AC
  Filled 2021-02-28: qty 2

## 2021-02-28 MED ORDER — STERILE WATER FOR IRRIGATION IR SOLN
Status: DC | PRN
  Administered 2021-02-28: 3000 mL

## 2021-02-28 MED ORDER — ACETAMINOPHEN 325 MG RE SUPP: 650.0000 mg | RECTAL | Status: DC | PRN

## 2021-02-28 MED ORDER — MEPERIDINE HCL 25 MG/ML IJ SOLN
50.0000 mg | Freq: Once | INTRAMUSCULAR | Status: AC
  Administered 2021-02-28: 50 mg via INTRAMUSCULAR

## 2021-02-28 MED ORDER — PROMETHAZINE HCL 25 MG/ML IJ SOLN
25.0000 mg | Freq: Once | INTRAMUSCULAR | Status: AC
  Administered 2021-02-28: 25 mg via INTRAMUSCULAR

## 2021-02-28 MED ORDER — ACETAMINOPHEN 325 MG PO TABS: 650.0000 mg | ORAL_TABLET | ORAL | Status: DC | PRN

## 2021-02-28 MED ORDER — DEXAMETHASONE SODIUM PHOSPHATE 4 MG/ML IJ SOLN
INTRAMUSCULAR | Status: DC | PRN
Start: 2021-02-28 — End: 2021-02-28
  Administered 2021-02-28: 5 mg via INTRAVENOUS

## 2021-02-28 MED ORDER — DEXAMETHASONE SODIUM PHOSPHATE 10 MG/ML IJ SOLN
INTRAMUSCULAR | Status: AC
  Filled 2021-02-28: qty 1

## 2021-02-28 MED ORDER — MIDAZOLAM HCL 5 MG/5ML IJ SOLN
INTRAMUSCULAR | Status: DC | PRN
  Administered 2021-02-28 (×2): 1 mg via INTRAVENOUS

## 2021-02-28 MED ORDER — PROMETHAZINE HCL 25 MG/ML IJ SOLN
INTRAMUSCULAR | Status: AC
  Filled 2021-02-28: qty 1

## 2021-02-28 MED ORDER — CIPROFLOXACIN IN D5W 400 MG/200ML IV SOLN
400.0000 mg | INTRAVENOUS | Status: AC
  Administered 2021-02-28: 400 mg via INTRAVENOUS

## 2021-02-28 MED ORDER — PROPOFOL 10 MG/ML IV BOLUS
INTRAVENOUS | Status: DC | PRN
  Administered 2021-02-28: 20 mg via INTRAVENOUS

## 2021-02-28 MED ORDER — ONDANSETRON HCL 4 MG/2ML IJ SOLN
INTRAMUSCULAR | Status: AC
  Filled 2021-02-28: qty 2

## 2021-02-28 MED ORDER — PROPOFOL 500 MG/50ML IV EMUL
INTRAVENOUS | Status: DC | PRN
  Administered 2021-02-28: 100 ug/kg/min via INTRAVENOUS

## 2021-02-28 MED ORDER — LIDOCAINE HCL (CARDIAC) PF 100 MG/5ML IV SOSY
PREFILLED_SYRINGE | INTRAVENOUS | Status: DC | PRN
  Administered 2021-02-28: 50 mg via INTRAVENOUS

## 2021-02-28 MED ORDER — LIDOCAINE 2% (20 MG/ML) 5 ML SYRINGE
INTRAMUSCULAR | Status: AC
  Filled 2021-02-28: qty 5

## 2021-02-28 SURGICAL SUPPLY — 21 items
BAG DRAIN URO-CYSTO SKYTR STRL (DRAIN) ×2 IMPLANT
BAG DRN RND TRDRP ANRFLXCHMBR (UROLOGICAL SUPPLIES) ×1
BAG DRN UROCATH (DRAIN) ×1
BAG URINE DRAIN 2000ML AR STRL (UROLOGICAL SUPPLIES) ×1 IMPLANT
CATH ROBINSON RED A/P 16FR (CATHETERS) ×2 IMPLANT
CLOTH BEACON ORANGE TIMEOUT ST (SAFETY) ×2 IMPLANT
ELECT REM PT RETURN 9FT ADLT (ELECTROSURGICAL) ×2
ELECTRODE REM PT RTRN 9FT ADLT (ELECTROSURGICAL) ×1 IMPLANT
GLOVE SURG POLYISO LF SZ8 (GLOVE) ×2 IMPLANT
GOWN STRL REUS W/TWL XL LVL3 (GOWN DISPOSABLE) ×2 IMPLANT
KIT TURNOVER CYSTO (KITS) ×2 IMPLANT
MANIFOLD NEPTUNE II (INSTRUMENTS) ×2 IMPLANT
NDL SAFETY ECLIPSE 18X1.5 (NEEDLE) IMPLANT
NEEDLE HYPO 18GX1.5 SHARP (NEEDLE)
PACK CYSTO (CUSTOM PROCEDURE TRAY) ×2 IMPLANT
PLUG CATH AND CAP STER (CATHETERS) ×1 IMPLANT
SYR 30ML LL (SYRINGE) ×2 IMPLANT
SYR BULB IRRIG 60ML STRL (SYRINGE) ×1 IMPLANT
TUBE CONNECTING 12X1/4 (SUCTIONS) IMPLANT
WATER STERILE IRR 3000ML UROMA (IV SOLUTION) ×3 IMPLANT
WATER STERILE IRR 500ML POUR (IV SOLUTION) ×1 IMPLANT

## 2021-02-28 NOTE — Transfer of Care (Signed)
Immediate Anesthesia Transfer of Care Note  Patient: Brenda [name]  Procedure(s) Performed: CYSTOSCOPY/HYDRODISTENSION INSTILL CLORPACTION (Bladder)  Patient Location: PACU  Anesthesia Type:MAC  Level of Consciousness: awake, alert  and oriented  Airway & Oxygen Therapy: Patient Spontanous Breathing and Patient connected to nasal cannula oxygen  Post-op Assessment: Report given to RN and Post -op Vital signs reviewed and stable  Post vital signs: Reviewed and stable  Last Vitals:  Vitals Value Taken Time  BP 107/76 02/28/21 1130  Temp 36.7 C 02/28/21 1130  Pulse 66 02/28/21 1136  Resp 15 02/28/21 1136  SpO2 100 % 02/28/21 1136  Vitals shown include unvalidated device data.  Last Pain:  Vitals:   02/28/21 1035  TempSrc: Oral  PainSc: 5       Patients Stated Pain Goal: 3 (49/61/16 4353)  Complications: No notable events documented.

## 2021-02-28 NOTE — Op Note (Signed)
Procedure: Cystoscopy with hydrodistention of the bladder, instillation of Clorpactin and tetracaine.  Preop diagnosis: Interstitial cystitis.  Postop diagnosis: Same.  Surgeon: Dr. [name].  Anesthesia: MAC.  Drain: 16 [place] Silastic Foley catheter.  Specimen: None.  EBL: None.  Complications: None.  Indications: Brenda [name] is a 70 year old female with a history of interstitial cystitis who requires intermittent hydrodistention and Clorpactin instillation she returns for that today.  Procedure: She was given antibiotics.  She was taken operating room where she was placed on the table in the lithotomy position and fitted with PAS hose.  She was given sedation as needed for the procedure.  She was prepped with Betadine solution and draped in usual sterile fashion.  The 73 French scope was passed with the 30 degree lens.  Inspection demonstrated a smooth bladder wall without tumors, stones or inflammation.  Ureteral orifices were unremarkable.  The urethra was unremarkable.  The bladder was then distended to capacity under 80 cm of water pressure.  This was held for a minute and then the bladder was drained.  Reinspection after hydrodistention demonstrated scattered glomerulations consistent with her diagnosis of interstitial cystitis.  The cystoscope was removed and a 16 French latex free Foley was placed.  The balloon was filled with 10 mL of sterile fluid.  The bladder was then instilled with a Clorpactin solution consisting of 1 ampoule of Clorpactin in 500 mL of sterile water.  Approximately 200 mL was instilled.  This was left indwelling for 1 minute.  The bladder was then drained and 30 mL of tetracaine solution was then instilled.  The catheter was plugged.  She was taken down from lithotomy position and she was moved to the recovery room in stable condition.  There were no complications.

## 2021-02-28 NOTE — Discharge Instructions (Addendum)
CYSTOSCOPY HOME CARE INSTRUCTIONS  Activity: Rest for the remainder of the day.  Do not drive or operate equipment today.  You may resume normal activities in one to two days as instructed by your physician.   Meals: Drink plenty of liquids and eat light foods such as gelatin or soup this evening.  You may return to a normal meal plan tomorrow.  Return to Work: You may return to work in one to two days or as instructed by your physician.  Special Instructions / Symptoms: Call your physician if any of these symptoms occur:   -persistent or heavy bleeding  -bleeding which continues after first few urination  -large blood clots that are difficult to pass  -urine stream diminishes or stops completely  -fever equal to or higher than 101 degrees Farenheit.  -cloudy urine with a strong, foul odor  -severe pain  Females should always wipe from front to back after elimination.  You may feel some burning pain when you urinate.  This should disappear with time.  Applying moist heat to the lower abdomen or a hot tub bath may help relieve the pain. \    Patient Signature:  ________________________________________________________  Nurse's Signature:  ________________________________________________________   Post Anesthesia Home Care Instructions  Activity: Get plenty of rest for the remainder of the day. A responsible individual must stay with you for 24 hours following the procedure.  For the next 24 hours, DO NOT: -Drive a car -Operate machinery -Drink alcoholic beverages -Take any medication unless instructed by your physician -Make any legal decisions or sign important papers.  Meals: Start with liquid foods such as gelatin or soup. Progress to regular foods as tolerated. Avoid greasy, spicy, heavy foods. If nausea and/or vomiting occur, drink only clear liquids until the nausea and/or vomiting subsides. Call your physician if vomiting continues.  Special  Instructions/Symptoms: Your throat may feel dry or sore from the anesthesia or the breathing tube placed in your throat during surgery. If this causes discomfort, gargle with warm salt water. The discomfort should disappear within 24 hours.  If you had a scopolamine patch placed behind your ear for the management of post- operative nausea and/or vomiting:  1. The medication in the patch is effective for 72 hours, after which it should be removed.  Wrap patch in a tissue and discard in the trash. Wash hands thoroughly with soap and water. 2. You may remove the patch earlier than 72 hours if you experience unpleasant side effects which may include dry mouth, dizziness or visual disturbances. 3. Avoid touching the patch. Wash your hands with soap and water after contact with the patch.     

## 2021-02-28 NOTE — Progress Notes (Signed)
Patient instilled Tetracycline from home into catheter prior to moving to phase II and prior to removing catheter in phase II.  Patient removed foley catheter per her usual routine.

## 2021-02-28 NOTE — Anesthesia Postprocedure Evaluation (Signed)
Anesthesia Post Note  Patient: Brenda [name]  Procedure(s) Performed: CYSTOSCOPY/HYDRODISTENSION INSTILL CLORPACTION (Bladder)     Patient location during evaluation: PACU Anesthesia Type: MAC Level of consciousness: awake and alert Pain management: pain level controlled Vital Signs Assessment: post-procedure vital signs reviewed and stable Respiratory status: spontaneous breathing, nonlabored ventilation, respiratory function stable and patient connected to nasal cannula oxygen Cardiovascular status: stable and blood pressure returned to baseline Postop Assessment: no apparent nausea or vomiting Anesthetic complications: no   No notable events documented.  Last Vitals:  Vitals:   02/28/21 1200 02/28/21 1210  BP: 120/81   Pulse: 60 61  Resp: 13 14  Temp:    SpO2: 100% 98%    Last Pain:  Vitals:   02/28/21 1210  TempSrc:   PainSc: 2                  Tiajuana Amass

## 2021-02-28 NOTE — Interval H&P Note (Signed)
History and Physical Interval Note:  02/28/2021 10:32 AM  Brenda [name]  has presented today for surgery, with the diagnosis of INTERSTITIAL CYSTITIS.  The various methods of treatment have been discussed with the patient and family. After consideration of risks, benefits and other options for treatment, the patient has consented to  Procedure(s): CYSTOSCOPY/HYDRODISTENSION INSTILL CLORPACTION (N/A) as a surgical intervention.  The patient's history has been reviewed, patient examined, no change in status, stable for surgery.  I have reviewed the patient's chart and labs.  Questions were answered to the patient's satisfaction.     [name]

## 2021-02-28 NOTE — Anesthesia Preprocedure Evaluation (Signed)
Anesthesia Evaluation  Patient identified by MRN, date of birth, ID band Patient awake    Reviewed: Patient's Chart, lab work & pertinent test results  History of Anesthesia Complications (+) PONV  Airway Mallampati: II  TM Distance: >3 FB Neck ROM: Full    Dental  (+) Teeth Intact   Pulmonary asthma ,    Pulmonary exam normal        Cardiovascular hypertension, Pt. on medications and Pt. on home beta blockers  Rhythm:Regular Rate:Normal     Neuro/Psych  Headaches, Anxiety Depression CVA, No Residual Symptoms    GI/Hepatic Neg liver ROS, GERD  Medicated,  Endo/Other  negative endocrine ROS  Renal/GU CRFRenal disease Bladder dysfunction  Interstitial cystitis, urethral stenosis    Musculoskeletal  (+) Arthritis , Osteoarthritis,  Fibromyalgia -  Abdominal (+)  Abdomen: soft. Bowel sounds: normal.  Peds  Hematology negative hematology ROS (+)   Anesthesia Other Findings   Reproductive/Obstetrics                             Anesthesia Physical  Anesthesia Plan  ASA: 3  Anesthesia Plan: MAC   Post-op Pain Management:    Induction:   PONV Risk Score and Plan: 3 and Ondansetron, Dexamethasone, Propofol infusion and Treatment may vary due to age or medical condition  Airway Management Planned: Simple Face Mask and Natural Airway  Additional Equipment: None  Intra-op Plan:   Post-operative Plan:   Informed Consent: I have reviewed the patients History and Physical, chart, labs and discussed the procedure including the risks, benefits and alternatives for the proposed anesthesia with the patient or authorized representative who has indicated his/her understanding and acceptance.     Dental advisory given  Plan Discussed with: CRNA and Surgeon  Anesthesia Plan Comments:         Anesthesia Quick Evaluation

## 2021-03-01 ENCOUNTER — Encounter (HOSPITAL_BASED_OUTPATIENT_CLINIC_OR_DEPARTMENT_OTHER): Payer: Self-pay | Admitting: Urology

## 2021-03-03 ENCOUNTER — Telehealth: Payer: Self-pay

## 2021-03-03 NOTE — Telephone Encounter (Signed)
Patient called to say that Dr. Marla Roe took a lesion off her nose about a week ago.  She said that she saw the report in MyChart but would like to hear from [place] as to whether or not it's ok.  Please call.

## 2021-03-06 DIAGNOSIS — Z719 Counseling, unspecified: Secondary | ICD-10-CM

## 2021-03-06 NOTE — Telephone Encounter (Deleted)
Called and spoke with the patient regarding the message below.  Informed the patient that I spoke with Dr. Marla Roe regarding her pathology result, and she stated that it's a granulomas skin irritation nothing to worry about.  Patient verbalized understanding and agreed.    While on the phone asked about if we had Zo Vitamin C

## 2021-03-06 NOTE — Telephone Encounter (Signed)
Called and spoke with the patient regarding the message below.  Informed the patient that I spoke with Dr. Marla Roe regarding her pathology result, and she stated that it's a granulomas skin irritation nothing to worry about.  Patient verbalized understanding and agreed.    While on the phone she asked about if we had Zo Vitamin C 10%.  If so she would like to get some.    Informed the patient that we did have the Zo Skin 10% Vitamin C Self-Activating 46ml.    Also informed her per Dr. Marla Roe that the Laser would help, and no down time 350.  Patient verbalized understanding and agreed and stated that she will come by and pick it up.//AB/CMA

## 2021-03-13 DIAGNOSIS — E785 Hyperlipidemia, unspecified: Secondary | ICD-10-CM | POA: Diagnosis not present

## 2021-03-13 DIAGNOSIS — Z124 Encounter for screening for malignant neoplasm of cervix: Secondary | ICD-10-CM | POA: Diagnosis not present

## 2021-03-14 DIAGNOSIS — M47812 Spondylosis without myelopathy or radiculopathy, cervical region: Secondary | ICD-10-CM | POA: Diagnosis not present

## 2021-03-20 DIAGNOSIS — Z719 Counseling, unspecified: Secondary | ICD-10-CM

## 2021-03-20 DIAGNOSIS — H524 Presbyopia: Secondary | ICD-10-CM | POA: Diagnosis not present

## 2021-03-20 DIAGNOSIS — Z961 Presence of intraocular lens: Secondary | ICD-10-CM | POA: Diagnosis not present

## 2021-03-20 DIAGNOSIS — H5213 Myopia, bilateral: Secondary | ICD-10-CM | POA: Diagnosis not present

## 2021-03-21 ENCOUNTER — Ambulatory Visit: Payer: PPO | Admitting: Cardiovascular Disease

## 2021-03-21 ENCOUNTER — Encounter: Payer: Self-pay | Admitting: Cardiovascular Disease

## 2021-03-21 ENCOUNTER — Other Ambulatory Visit: Payer: Self-pay

## 2021-03-21 VITALS — BP 90/70 | HR 72 | Ht 60.0 in | Wt 147.2 lb

## 2021-03-21 DIAGNOSIS — R7303 Prediabetes: Secondary | ICD-10-CM

## 2021-03-21 DIAGNOSIS — I358 Other nonrheumatic aortic valve disorders: Secondary | ICD-10-CM | POA: Diagnosis not present

## 2021-03-21 DIAGNOSIS — E782 Mixed hyperlipidemia: Secondary | ICD-10-CM | POA: Diagnosis not present

## 2021-03-21 DIAGNOSIS — N1831 Chronic kidney disease, stage 3a: Secondary | ICD-10-CM

## 2021-03-21 DIAGNOSIS — I700 Atherosclerosis of aorta: Secondary | ICD-10-CM | POA: Diagnosis not present

## 2021-03-21 DIAGNOSIS — R0602 Shortness of breath: Secondary | ICD-10-CM | POA: Diagnosis not present

## 2021-03-21 DIAGNOSIS — Z8673 Personal history of transient ischemic attack (TIA), and cerebral infarction without residual deficits: Secondary | ICD-10-CM | POA: Diagnosis not present

## 2021-03-21 DIAGNOSIS — I10 Essential (primary) hypertension: Secondary | ICD-10-CM

## 2021-03-21 DIAGNOSIS — R0989 Other specified symptoms and signs involving the circulatory and respiratory systems: Secondary | ICD-10-CM | POA: Diagnosis not present

## 2021-03-21 NOTE — Progress Notes (Signed)
Cardiology Office Note:    Date:  03/26/2021   ID:  Brenda [name], DOB [birth year], MRN [id number]  PCP:  Prince Solian, MD   [facility] Providers Cardiologist:  Sanda Klein, MD     Referring MD: Prince Solian, MD   Chief Complaint  Patient presents with   Follow-up   History of Present Illness:    Brenda [name] is a 70 y.o. 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  She is a "partially" retired Therapist, sports.  Continues to work 2 days a week at 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  She was referred for evaluation of intermittent problems with shortness of breath and chest discomfort.  She underwent an echocardiogram and coronary CT angiography with very reassuring results.  The echocardiogram shows normal left ventricular systolic function and no significant valvular abnormalities.  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  Some aortic atherosclerosis was seen.  Normal caliber aorta.    She continues to have mild shortness of breath walking faster or farther than usual, but her chest discomfort has not bothered her recently.  Her blood pressure is quite low today at 90/70, but she denies dizziness, fatigue presyncope or syncope.  Most recent creatinine from 02/28/2021 was higher at 1.5 (previously 1.2).  She denies edema, orthopnea, PND, cough, hemoptysis, lower extremity edema or claudication.  She does not tolerate statins due to liver function test abnormalities, but is taking ezetimibe and Repatha.  She has borderline hyperglycemia with a recent hemoglobin  A1c of 5.8%, improved to 5.7% on labs performed 02/24/2019.  She has asthma, but does tolerate metoprolol in a dose of 25 mg twice a day.  MRIs have shown "silent strokes" in 1996 in 2009.  Her only warning that something was wrong was memory difficulty.  Her mother has a history of mitral valve prolapse and had a heart attack at a relatively advanced age.  Her 46 year old sister has atrial fibrillation and and a pacemaker as well as a history of stroke.  Her electrocardiogram shows normal sinus rhythm with delayed R wave progression (RS transition in V5) but is otherwise a completely normal tracing.  Compared with previous ECGs in the system it is not meaningfully different.  Past Medical History:  Diagnosis Date   Anxiety    Arthritis    Bladder pain    Cervical disc disease    THINKS C 4 TO C 5   Chronic cough    followed by dr wert and ENT dr Carol Ada   Chronic kidney disease (CKD), stage III (moderate) ([place])    Chronic seasonal allergic rhinitis    Depression    Dyspnea    with exertion    Fibromyalgia    Frequency of urination    GERD (gastroesophageal reflux disease)    Heart murmur    per pt pcp only hear's murmur occasionally, asymptomatic;  pt evaluated by cardiologist--- dr Sallyanne Kuster 11-10-2020 note in epic, echo normal with trivial MR/ TR   History of pancreatitis 1988-1989   History of panic attacks    History of stroke 1996   mild without residuals;  per MRI imaging 2010 left thalamic infarct without residual   Hyperlipidemia    Hypertension LABILE   followed by pcp   IBS (irritable bowel syndrome)    Interstitial cystitis    urologist-- dr Jeffie Pollock   Lumbar spondylosis    Migraines    Moderate persistent asthma    followed by pcp and dr wert    Multiple pulmonary nodules    pulmonologist-- dr wert   Normal cardiac stress test JULY 2009---  NORMAL   PONV (postoperative nausea and vomiting)    Spasm of sphincter of Oddi 1987   02-13-2019  per pt no issues  since she watches what medication's she takes   Urethral stenosis    s/p multiple dilatations   Urgency of urination     Past Surgical History:  Procedure Laterality Date   [place]  04/ 1986   bilateral reduction mastopexy  1988   bilateral turbinate resection  1990   nasal spreading graft   BREAST CYST EXCISION Bilateral 03/18/2019   Procedure: excision necrotic breast tissue left ;  Surgeon: Wallace Going, DO;  Location: [place];  Service: Plastics;  Laterality: Bilateral;   BREAST CYST EXCISION Bilateral 06/30/2020   Procedure: Excision of right breast fat necrosis and left inferior breast;  Surgeon: Wallace Going, DO;  Location: [place];  Service: Plastics;  Laterality: Bilateral;   BREAST REDUCTION SURGERY  10/17/2018   Procedure: bilateral breast reduction;  Surgeon: Wallace Going, DO;  Location: [place];  Service: Plastics;;  please adjust time to reflect 4 hours   CATARACT EXTRACTION W/ INTRAOCULAR LENS  IMPLANT, BILATERAL  08 and 09/ 2017   CHOLECYSTECTOMY  04/ 1986   COLONOSCOPY  02/2004, 11/2010, LAST 03-2016 ENDO DONE ALSO   2005: Normal - Dr. Sammuel Cooper 2012: Normal   CYSTO WITH HYDRODISTENSION N/A 12/17/2013   Procedure: CYSTO HYDRODISTENSION WITH INSTALLATION OF CHLOROPACTIN AND TETRACAINE;  Surgeon: Malka So, MD;  Location: [facility];  Service: Urology;  Laterality: N/A;   CYSTO WITH HYDRODISTENSION N/A 06/02/2015   Procedure: CYSTOSCOPY/HYDRODISTENSION WITH INSTILLATION OF CLORPACTIN ;  Surgeon: [name], MD;  Location[facility];  Service: Urology;  Laterality: N/A;   CYSTO WITH HYDRODISTENSION N/A 11/08/2016   Procedure: CYSTOSCOPY/HYDRODISTENSION INSTILLATION OF CLORPACTIN;  Surgeon: [name], MD;  Location: [facility];  Service: Urology;  Laterality: N/A;   CYSTO WITH HYDRODISTENSION N/A 03/06/2018    Procedure: CYSTOSCOPY/HYDRODISTENSION INSTILL CHLORPACTION;  Surgeon: [name], MD;  Location[facility];  Service: Urology;  Laterality: N/A;   CYSTO WITH HYDRODISTENSION N/A 04/02/2019   Procedure: CYSTOSCOPY/HYDRODISTENSION INSTILL CHLORPACTIN;  Surgeon: [name], MD;  Location: [facility];  Service: Urology;  Laterality: N/A;   CYSTO WITH HYDRODISTENSION N/A 02/17/2020   Procedure: CYSTOSCOPY/HYDRODISTENTION WITH INSTILLATION OF CHLORPACTIN;  Surgeon: [name], MD;  Location: [facility];  Service: Urology;  Laterality: N/A;  ONLY NEEDS 30 MIN   CYSTO WITH HYDRODISTENSION N/A 02/28/2021   Procedure: CYSTOSCOPY/HYDRODISTENSION INSTILL CLORPACTION;  Surgeon: [name], MD;  Location: [facility];  Service: Urology;  Laterality: N/A;   CYSTOSCOPY WITH URETHRAL DILATATION  07/12/2011   Procedure: CYSTOSCOPY WITH URETHRAL DILATATION;  Surgeon: Malka So, MD;  Location: [facility];  Service: Urology;  Laterality: N/A;  HOD AND INSTILLATION OF CHLOROPACTIN C-ARM    CYSTOSCOPY WITH URETHRAL DILATATION  N/A 06/26/2012   Procedure: CYSTOSCOPY WITH URETHRAL DILATATION HYDRODISTENSION AND INSTILLATION OF CLORPACTION ;  Surgeon: Malka So, MD;  Location: [facility];  Service: Urology;  Laterality: N/A;  CYSTOSCOPY WITH URETHRAL DILATATION HYDRODISTENSION AND INSTILLATION OF CLORPACTION    CYSTOSTOMY W/ BLADDER BIOPSY  1983   DG BARIUM SWALLOW ([place] [facility])  03/2016   DILATION AND CURETTAGE OF UTERUS  1987   ERCP  1988   for pancreatitis with stents   S/P SEVERE PANCREATITIS   HEMORRHOID SURGERY     LASIK Bilateral APRIL 2005   LIPOSUCTION Bilateral 10/17/2018   Procedure: LIPOSUCTION;  Surgeon: Wallace Going, DO;  Location: [place];  Service: Plastics;  Laterality: Bilateral;   LIPOSUCTION Right 03/18/2019   Procedure: excision right necrotic breast tissue with liposuction;   Surgeon: Wallace Going, DO;  Location: [place];  Service: Plastics;  Laterality: Right;   LIPOSUCTION Bilateral 06/30/2020   Procedure: Bilateral liposuction of lateral breast;  Surgeon: Wallace Going, DO;  Location: [place];  Service: Plastics;  Laterality: Bilateral;   MULTIPLE CYSTO/ HOD/ URETHRAL DILATION/ INSTILLATION CLORPACTIN  .LAST ONE 03-31-2010   NASAL SINUS SURGERY  1977   sinus cyst   REDUCTION MAMMAPLASTY Bilateral 1988   REDUCTION MAMMAPLASTY Bilateral 09/2018   REDUCTION MAMMAPLASTY Bilateral 03/2019   revision rhinoplasty  [place]   VIDEO BRONCHOSCOPY Bilateral 08/23/2016   Procedure: VIDEO BRONCHOSCOPY WITHOUT FLUORO;  Surgeon: Javier Glazier, MD;  Location: [facility];  Service: Cardiopulmonary;  Laterality: Bilateral;    Current Medications: Current Meds  Medication Sig   acetaminophen (TYLENOL) 325 MG tablet Take 500 mg by mouth every 6 (six) hours as needed (for headaches).    albuterol (VENTOLIN HFA) 108 (90 Base) MCG/ACT inhaler Inhale into the lungs every 6 (six) hours as needed for wheezing or shortness of breath.   cyclobenzaprine (FLEXERIL) 10 MG tablet Take 10 mg by mouth 3 (three) times daily as needed.   diltiazem 2 % GEL Apply 1 application topically 2 (two) times daily as needed (rectal pain).   ezetimibe (ZETIA) 10 MG tablet Take 10 mg by mouth every evening.   FLUoxetine (PROZAC) 10 MG capsule Take 1 capsule (10 mg total) by mouth daily.   fluticasone (FLONASE) 50 MCG/ACT nasal spray Place 2 sprays into both nostrils daily as needed.   folic acid (FOLVITE) 1 MG tablet Take 1 mg by mouth daily.   LORazepam (ATIVAN) 1 MG tablet TAKE 1 TABLET EVERY 8 HOURS AS NEEDED FOR ANXIETY.   losartan-hydrochlorothiazide (HYZAAR) 100-12.5 MG tablet Take 1 tablet by mouth daily.   meperidine (DEMEROL) 25 MG/ML injection Every 4  weeks   metoprolol (LOPRESSOR) 50 MG tablet Take 25 mg by mouth 2 (two) times daily.   pantoprazole (PROTONIX) 40 MG tablet Take 40 mg by mouth daily.   REPATHA SURECLICK 350 MG/ML SOAJ Inject SQ into the abdomen every 2 weeks Pt reports taking every 2 weeks on 02/10/20   Vitamin D, Ergocalciferol, (DRISDOL) 50000 UNITS CAPS Take 50,000 Units by mouth every 7 (seven) days.     Allergies:   Codeine, Latex, Stadol [butorphanol tartrate], Adhesive [tape], Bupivacaine, Butorphanol tartrate, Clarithromycin, Cymbalta [duloxetine hcl], Dilaudid [hydromorphone hcl], Elavil [amitriptyline hcl], Hydrocodone, Levbid [hyoscyamine sulfate], Lisinopril, Pentazocine lactate, Percodan [oxycodone-aspirin], Trazodone and nefazodone, Vistaril [hydroxyzine hcl], and Morphine and related   Social History  Socioeconomic History   Marital status: Married    Spouse name: Not on file   Number of children: 0   Years of education: 16   Highest education level: Not on file  Occupational History   Occupation: RN  Tobacco Use   Smoking status: Never   Smokeless tobacco: Never  Vaping Use   Vaping Use: Never used  Substance and Sexual Activity   Alcohol use: No   Drug use: No   Sexual activity: Not on file  Other Topics Concern   Not on file  Social History Narrative   Married, lives with spouse   No children   RN at [employer]   No recent travel      [facility]:   Originally from [place]. Always lived in [place]. Previously has traveled to [place], [place], [place], [place], & mostly [place]. No indoor pets currently. She does have cats in her garage. No bird, mold, or hot tub exposure. No indoor plants. Mostly wood floors. She did have her bedroom carpet taken up in Summer 2017. Has mostly blinds. No wood burning fire place. Previously enjoyed Firefighter & playing piano.    Social Determinants of Health   Financial Resource Strain: Not on file  Food Insecurity: Not on file  Transportation  Needs: Not on file  Physical Activity: Not on file  Stress: Not on file  Social Connections: Not on file     Family History: The patient's family history includes Allergies in her mother; Asthma in her mother; Brain cancer in her father; Colon cancer in her cousin and maternal grandmother; Colon polyps in her father; Diabetes in her mother and sister; Heart disease in her father, mother, and sister; Irritable bowel syndrome in her mother and sister; Melanoma in her sister; Osteoarthritis in her father, mother, and sister; Stroke in her sister; Ulcerative colitis in her maternal uncle. There is no history of Stomach cancer or Rheumatologic disease.  ROS:   Please see the history of present illness.     All other systems reviewed and are negative.  EKGs/Labs/Other Studies Reviewed:    The following studies were reviewed today: Echocardiogram  1. Left ventricular ejection fraction, by estimation, is 60 to 65%. The  left ventricle has normal function. The left ventricle has no regional  wall motion abnormalities. Left ventricular diastolic parameters are  consistent with Grade I diastolic  dysfunction (impaired relaxation). The average left ventricular global  longitudinal strain is -23.9 %. The global longitudinal strain is normal.   2. Right ventricular systolic function is normal. The right ventricular  size is normal. A Prominent moderator band is visualized. There is normal  pulmonary artery systolic pressure.   3. The mitral valve is normal in structure. Trivial mitral valve  regurgitation. No evidence of mitral stenosis.   4. The aortic valve is tricuspid. Aortic valve regurgitation is not  visualized. Mild aortic valve sclerosis is present, with no evidence of  aortic valve stenosis.   5. The inferior vena cava is normal in size with greater than 50%  respiratory variability, suggesting right atrial pressure of 3 mmHg.   Coronary CT angiography 11/23/2020   IMPRESSION: 1.  Coronary calcium score of 0. 2. Normal coronary origin with right dominance. 3. Normal coronary arteries. 4. 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   EKG:  EKG is not ordered today.  The ekg ordered at her last appointment demonstrates sinus rhythm, delayed R wave progression to lead V5, otherwise normal  tracing  Recent Labs: 02/28/2021: BUN 27; Creatinine, Ser 1.50; Hemoglobin 15.3; Potassium 4.8; Sodium 137  08/09/2020 Glucose 101, creatinine 1.2 (GFR 60), potassium 5.3, normal liver function tests, TSH 2.63 Hemoglobin A1c 5.8% Hemoglobin 13.9, platelets 240 9K Recent Lipid Panel No results found for: CHOL, TRIG, HDL, CHOLHDL, VLDL, LDLCALC, LDLDIRECT 08/09/2020 Cholesterol 137, triglycerides 177, HDL 49, LDL 53, APO B 65  Risk Assessment/Calculations:      Physical Exam:    VS:  BP 90/70 (BP Location: Left Arm, Patient Position: Sitting, Cuff Size: Normal) Comment: 100/66- Dynomap  Pulse 72   Ht 5' (1.524 m)   Wt 147 lb 3.2 oz (66.8 kg)   SpO2 99%   BMI 28.75 kg/m     Wt Readings from Last 3 Encounters:  03/21/21 147 lb 3.2 oz (66.8 kg)  02/28/21 145 lb 4.8 oz (65.9 kg)  11/10/20 145 lb 12.8 oz (66.1 kg)     General: Alert, oriented x3, no distress, overweight Head: no evidence of trauma, PERRL, EOMI, no exophtalmos or lid lag, no myxedema, no xanthelasma; normal ears, nose and oropharynx Neck: normal jugular venous pulsations and no hepatojugular reflux; brisk carotid pulses without delay and no carotid bruits Chest: clear to auscultation, no signs of consolidation by percussion or palpation, normal fremitus, symmetrical and full respiratory excursions Cardiovascular: normal position and quality of the apical impulse, regular rhythm, normal first and second heart sounds, 1-2/6 early peaking aortic ejection murmur, no diastolic murmurs, rubs or gallops Abdomen: no tenderness or distention, no masses by palpation, no abnormal pulsatility or arterial bruits,  normal bowel sounds, no hepatosplenomegaly Extremities: no clubbing, cyanosis or edema; 2+ radial, ulnar and brachial pulses bilaterally; 2+ right femoral, posterior tibial and dorsalis pedis pulses; 2+ left femoral, posterior tibial and dorsalis pedis pulses; no subclavian or femoral bruits Neurological: grossly nonfocal Psych: Normal mood and affect  ASSESSMENT:    1. Shortness of breath   2. Essential hypertension   3. Aortic valve sclerosis   4. Bilateral carotid bruits   5. Stage 3a chronic kidney disease ([place])   6. Prediabetes   7. Aortic atherosclerosis ([place])   8. History of ischemic stroke   9. Mixed hyperlipidemia     PLAN:    In order of problems listed above:  Exertional dyspnea: No evidence of structural cardiac abnormalities and only subtle evidence of diastolic dysfunction without evidence of elevated pressures.  These abnormalities may be related to hypertension, but her blood pressure is currently very well treated, even excessively so.  Encourage gradual increase in physical activity.  If dyspnea persists, consider pulmonary evaluation.  PFTs performed 2015-2019 showed borderline FVC reduction at 85% of predicted, normal FEV1. HTN: Blood pressure is low today, she is not symptomatic.  It is important that she stay very well-hydrated. Murmur: Due to aortic valve sclerosis.  No major valvular abnormality on her echocardiogram. Carotid bruits: We will follow-up with bilateral carotid duplex ultrasound, but with bilateral carotid bruits CKD3a: Most recent creatinine 1.5, but before that was as good as 1.2, GFR 60.  Last October 2021 creatinine was 1.3. Prediabetes: A1c 5.7%, a slight improvement.  Encouraged weight loss and regular physical activity. Aortic atherosclerosis: incidentally noted on CT chest.  History of stroke, including coronary atherosclerosis, no claudication, normal caliber aorta. History of ischemic strokes: remote in 1996 and 2009; no sequelae. HLP:  Triglycerides are borderline elevated on labs from April, but otherwise all her lipid parameters are excellent on Repatha and ezetimibe.  Medication Adjustments/Labs and Tests Ordered: Current medicines are reviewed at length with the patient today.  Concerns regarding medicines are outlined above.  Orders Placed This Encounter  Procedures   US Carotid Duplex Bilateral    No orders of the defined types were placed in this encounter.   Patient Instructions  Medication Instructions:  No changes *If you need a refill on your cardiac medications before your next appointment, please call your pharmacy*   Lab Work: None ordered If you have labs (blood work) drawn today and your tests are completely normal, you will receive your results only by: [place] (if you have MyChart) OR A paper copy in the mail If you have any lab test that is abnormal or we need to change your treatment, we will call you to review the results.   Testing/Procedures: None ordered   Follow-Up: At [facility], you and your health needs are our priority.  As part of our continuing mission to provide you with exceptional heart care, we have created designated Provider Care Teams.  These Care Teams include your primary Cardiologist (physician) and Advanced Practice Providers (APPs -  Physician Assistants and Nurse Practitioners) who all work together to provide you with the care you need, when you need it.  We recommend signing up for the patient portal called "MyChart".  Sign up information is provided on this After Visit Summary.  MyChart is used to connect with patients for Virtual Visits (Telemedicine).  Patients are able to view lab/test results, encounter notes, upcoming appointments, etc.  Non-urgent messages can be sent to your provider as well.   To learn more about what you can do with MyChart, go to [email or web address].    Your next appointment:   Follow up as needed with Dr.  Sallyanne Kuster   Signed, Sanda Klein, MD  03/26/2021 2:28 PM    [place]

## 2021-03-21 NOTE — Patient Instructions (Signed)
Medication Instructions:  No changes *If you need a refill on your cardiac medications before your next appointment, please call your pharmacy*   Lab Work: None ordered If you have labs (blood work) drawn today and your tests are completely normal, you will receive your results only by: [place] (if you have MyChart) OR A paper copy in the mail If you have any lab test that is abnormal or we need to change your treatment, we will call you to review the results.   Testing/Procedures: None ordered   Follow-Up: At [facility], you and your health needs are our priority.  As part of our continuing mission to provide you with exceptional heart care, we have created designated Provider Care Teams.  These Care Teams include your primary Cardiologist (physician) and Advanced Practice Providers (APPs -  Physician Assistants and Nurse Practitioners) who all work together to provide you with the care you need, when you need it.  We recommend signing up for the patient portal called "MyChart".  Sign up information is provided on this After Visit Summary.  MyChart is used to connect with patients for Virtual Visits (Telemedicine).  Patients are able to view lab/test results, encounter notes, upcoming appointments, etc.  Non-urgent messages can be sent to your provider as well.   To learn more about what you can do with MyChart, go to [email or web address].    Your next appointment:   Follow up as needed with Dr. Sallyanne Kuster

## 2021-03-26 ENCOUNTER — Encounter: Payer: Self-pay | Admitting: Cardiovascular Disease

## 2021-03-31 DIAGNOSIS — R8271 Bacteriuria: Secondary | ICD-10-CM | POA: Diagnosis not present

## 2021-03-31 DIAGNOSIS — N3010 Interstitial cystitis (chronic) without hematuria: Secondary | ICD-10-CM | POA: Diagnosis not present

## 2021-04-06 ENCOUNTER — Other Ambulatory Visit: Payer: Self-pay | Admitting: *Deleted

## 2021-04-06 DIAGNOSIS — R0989 Other specified symptoms and signs involving the circulatory and respiratory systems: Secondary | ICD-10-CM

## 2021-04-06 DIAGNOSIS — M47812 Spondylosis without myelopathy or radiculopathy, cervical region: Secondary | ICD-10-CM | POA: Diagnosis not present

## 2021-04-10 DIAGNOSIS — I129 Hypertensive chronic kidney disease with stage 1 through stage 4 chronic kidney disease, or unspecified chronic kidney disease: Secondary | ICD-10-CM | POA: Diagnosis not present

## 2021-04-10 DIAGNOSIS — N3010 Interstitial cystitis (chronic) without hematuria: Secondary | ICD-10-CM | POA: Diagnosis not present

## 2021-04-10 DIAGNOSIS — N1831 Chronic kidney disease, stage 3a: Secondary | ICD-10-CM | POA: Diagnosis not present

## 2021-04-27 ENCOUNTER — Ambulatory Visit (HOSPITAL_COMMUNITY)
Admission: RE | Admit: 2021-04-27 | Discharge: 2021-04-27 | Disposition: A | Discharge status: Home / Self Care | Payer: PPO | Source: Ambulatory Visit | Attending: Cardiology | Admitting: Cardiology

## 2021-04-27 ENCOUNTER — Other Ambulatory Visit: Payer: Self-pay

## 2021-04-27 DIAGNOSIS — R0989 Other specified symptoms and signs involving the circulatory and respiratory systems: Secondary | ICD-10-CM | POA: Diagnosis not present

## 2021-04-28 DIAGNOSIS — N3010 Interstitial cystitis (chronic) without hematuria: Secondary | ICD-10-CM | POA: Diagnosis not present

## 2021-05-02 ENCOUNTER — Encounter: Payer: Self-pay | Admitting: Internal Medicine

## 2021-05-02 ENCOUNTER — Ambulatory Visit: Payer: PPO | Admitting: Internal Medicine

## 2021-05-02 VITALS — BP 108/76 | HR 64 | Ht 59.5 in | Wt 147.1 lb

## 2021-05-02 DIAGNOSIS — R1031 Right lower quadrant pain: Secondary | ICD-10-CM

## 2021-05-02 DIAGNOSIS — K582 Mixed irritable bowel syndrome: Secondary | ICD-10-CM | POA: Diagnosis not present

## 2021-05-02 DIAGNOSIS — E65 Localized adiposity: Secondary | ICD-10-CM | POA: Diagnosis not present

## 2021-05-02 DIAGNOSIS — K602 Anal fissure, unspecified: Secondary | ICD-10-CM | POA: Diagnosis not present

## 2021-05-02 MED ORDER — AMBULATORY NON FORMULARY MEDICATION: 2 refills | Status: AC

## 2021-05-02 NOTE — Progress Notes (Signed)
Brenda [name] 71 y.o. [birth year] [id number]  Assessment & Plan:   Encounter Diagnoses  Name Primary?   Anterior anal fissure Yes   Irritable bowel syndrome with both constipation and diarrhea    RLQ abdominal pain    Abdominal obesity      Treat fissure with diltiazem/lidocaine cream.  Twice daily to 3 times daily.  Follow-up in about 2 months.  Observe abdominal pain probable relationship to IBS  Try to reduce/eliminate sugar and processed foods.  This may help with her abdominal obesity concerns and perhaps abdominal discomfort.   I appreciate the opportunity to care for this patient. CC: Avva, Ravisankar, MD    Subjective:   Chief Complaint: Rectal pain and rectal bleeding, right lower quadrant discomfort  HPI 71 year old white woman with a history of irritable bowel syndrome, fibromyalgia and interstitial cystitis here with complaints of right lower quadrant pain and rectal pain.  She was last seen by me in 2017 when she was having diarrhea problems and had a colonoscopy as below:  Colonoscopy 04/10/2016 - Hypertrophied anal papilla(e) and perianal skin tags found on perianal exam. - The examined portion of the ileum was normal. - Diverticulosis in the sigmoid colon. - The examination was otherwise normal on direct and retroflexion views. - Biopsies were taken with a cold forceps from the entire colon for evaluation of microscopic colitis.-Normal  EGD same day - A few gastric polyps. Biopsied.-Fundic gland polyps - The examination was otherwise normal. - Biopsies were taken with a cold forceps for histology in the first portion of the duodenum and in the second portion of the duodenum. ? Olmesartan enteropathy-normal biopsies  Today she is complaining of a several month history of a right lower quadrant discomfort that comes and goes sort of a fullness and pressure.  It occurs prior to defecation for the most part.  It is not related to movement.  More  bothersome is a several month history of rectal pain particularly with and after defecation with some associated bright red blood on the tissue paper.  Stools are of variable size she does not recall any large hard stools or an initial injury.  She had some old diltiazem that had been prescribed by Dr. Marcello Moores of colorectal surgery and has used that some.  She says she has an anal stricture related to previous in office surgery of her anus with hemorrhoids and a fissure, by Dr. Lennie Hummer years ago.  Says Dr. Jeffie Pollock told her she might have a rectocele.  She sees him for her interstitial cystitis.\  She is concerned about abdominal obesity.  She tried to be seen in the healthy weight and wellness clinic but her BMI was not high enough to qualify.  She does drink some sugar in beverages, half-and-half tea not much in the way of sodas, uses sweeteners as well.  Does not sound like a large amount of artificial sweetener use.  Has a remote history of pancreatitis and sphincter of Oddi dysfunction, does not have signs or symptoms of pancreatic insufficiency.  Tells me she now has chronic kidney disease stage III. Allergies  Allergen Reactions   Codeine Other (See Comments)    SEVERE ELEVATED LIVER ENZYMES   Latex Rash   Stadol [Butorphanol Tartrate] Swelling    Lips numb, tongue swell, tachycardia   Adhesive [Tape] Other (See Comments)    TEARS SKIN; PLEASE USE COBAN WRAP!!   Bupivacaine Other (See Comments)    Other Reaction: PRE-SYNCOPE, TACTYCARDIA TACHYCARDIA/ PRESYNCOPE  Butorphanol Tartrate Other (See Comments)    NUMBNESS, TACHYCARDIA   Clarithromycin Nausea Only and Other (See Comments)    TACHYCARDIA   Cymbalta [Duloxetine Hcl] Other (See Comments)    Blurred vision   Dilaudid [Hydromorphone Hcl] Itching    SEVERE ITCHING   Elavil [Amitriptyline Hcl] Other (See Comments)    Elevated liver enzymes   Hydrocodone Other (See Comments)    Elevated liver enzymes   Levbid [Hyoscyamine Sulfate]  Other (See Comments)    Dizziness, nausea, headache   Lisinopril Cough    Dry cough   Pentazocine Lactate Other (See Comments)    Tachycardia    Percodan [Oxycodone-Aspirin] Nausea Only   Trazodone And Nefazodone Other (See Comments)    Tachycardia    Vistaril [Hydroxyzine Hcl] Other (See Comments)    Altered sensorium   Morphine And Related Itching and Rash    Elevate liver enzymes   Current Meds  Medication Sig   acetaminophen (TYLENOL) 325 MG tablet Take 500 mg by mouth every 6 (six) hours as needed (for headaches).    albuterol (VENTOLIN HFA) 108 (90 Base) MCG/ACT inhaler Inhale into the lungs every 6 (six) hours as needed for wheezing or shortness of breath.   Calcium Carb-Cholecalciferol (CALCIUM + D3) 600-800 MG-UNIT TABS Take 1 tablet by mouth 2 (two) times daily as needed.   cyclobenzaprine (FLEXERIL) 10 MG tablet Take 10 mg by mouth 3 (three) times daily as needed.   diltiazem 2 % GEL Apply 1 application topically 2 (two) times daily as needed (rectal pain).   ezetimibe (ZETIA) 10 MG tablet Take 10 mg by mouth every evening.   famotidine (PEPCID) 20 MG tablet Take 20 mg by mouth at bedtime as needed.   FLUoxetine (PROZAC) 10 MG capsule Take 1 capsule (10 mg total) by mouth daily.   fluticasone (FLONASE) 50 MCG/ACT nasal spray Place 2 sprays into both nostrils daily as needed.   folic acid (FOLVITE) 1 MG tablet Take 1 mg by mouth daily.   LORazepam (ATIVAN) 1 MG tablet TAKE 1 TABLET EVERY 8 HOURS AS NEEDED FOR ANXIETY.   losartan (COZAAR) 100 MG tablet Take 100 mg by mouth daily.   meperidine (DEMEROL) 25 MG/ML injection Every 4 weeks   metoprolol (LOPRESSOR) 50 MG tablet Take 25 mg by mouth 2 (two) times daily.   pantoprazole (PROTONIX) 40 MG tablet Take 40 mg by mouth daily.   REPATHA SURECLICK 161 MG/ML SOAJ Inject SQ into the abdomen every 2 weeks Pt reports taking every 2 weeks on 02/10/20   Vitamin D, Ergocalciferol, (DRISDOL) 50000 UNITS CAPS Take 50,000 Units by  mouth every 7 (seven) days.   zolpidem (AMBIEN) 5 MG tablet TAKE 1 TABLET AT BEDTIME AS NEEDED FOR INSOMNIA.   Past Medical History:  Diagnosis Date   Anxiety    Arthritis    Bladder pain    Cervical disc disease    THINKS C 4 TO C 5   Chronic cough    followed by dr wert and ENT dr Carol Ada   Chronic kidney disease (CKD), stage III (moderate) ([place])    Chronic seasonal allergic rhinitis    Depression    Dyspnea    with exertion    Fibromyalgia    Frequency of urination    GERD (gastroesophageal reflux disease)    Heart murmur    per pt pcp only hear's murmur occasionally, asymptomatic;  pt evaluated by cardiologist--- dr Sallyanne Kuster 11-10-2020 note in epic, echo normal with trivial MR/  TR   History of pancreatitis 1988-1989   History of panic attacks    History of stroke 1996   mild without residuals;  per MRI imaging 2010 left thalamic infarct without residual   Hyperlipidemia    Hypertension LABILE   followed by pcp   IBS (irritable bowel syndrome)    Interstitial cystitis    urologist-- dr Jeffie Pollock   Lumbar spondylosis    Migraines    Moderate persistent asthma    followed by pcp and dr wert    Multiple pulmonary nodules    pulmonologist-- dr wert   Normal cardiac stress test JULY 2009---  NORMAL   PONV (postoperative nausea and vomiting)    Spasm of sphincter of Oddi 1987   02-13-2019  per pt no issues since she watches what medication's she takes   Urethral stenosis    s/p multiple dilatations   Urgency of urination    Past Surgical History:  Procedure Laterality Date   [place]  04/ 1986   bilateral reduction mastopexy  1988   bilateral turbinate resection  1990   nasal spreading graft   BREAST CYST EXCISION Bilateral 03/18/2019   Procedure: excision necrotic breast tissue left ;  Surgeon: Wallace Going, DO;  Location: [place];  Service: Plastics;  Laterality: Bilateral;   BREAST CYST  EXCISION Bilateral 06/30/2020   Procedure: Excision of right breast fat necrosis and left inferior breast;  Surgeon: Wallace Going, DO;  Location: [place];  Service: Plastics;  Laterality: Bilateral;   BREAST REDUCTION SURGERY  10/17/2018   Procedure: bilateral breast reduction;  Surgeon: Wallace Going, DO;  Location: [place];  Service: Plastics;;  please adjust time to reflect 4 hours   CATARACT EXTRACTION W/ INTRAOCULAR LENS  IMPLANT, BILATERAL  08 and 09/ 2017   CHOLECYSTECTOMY  04/ 1986   COLONOSCOPY  02/2004, 11/2010, LAST 03-2016 ENDO DONE ALSO   2005: Normal - Dr. Sammuel Cooper 2012: Normal   CYSTO WITH HYDRODISTENSION N/A 12/17/2013   Procedure: CYSTO HYDRODISTENSION WITH INSTALLATION OF CHLOROPACTIN AND TETRACAINE;  Surgeon: Malka So, MD;  Location[facility];  Service: Urology;  Laterality: N/A;   CYSTO WITH HYDRODISTENSION N/A 06/02/2015   Procedure: CYSTOSCOPY/HYDRODISTENSION WITH INSTILLATION OF CLORPACTIN ;  Surgeon: [name], MD;  Location: [facility];  Service: Urology;  Laterality: N/A;   CYSTO WITH HYDRODISTENSION N/A 11/08/2016   Procedure: CYSTOSCOPY/HYDRODISTENSION INSTILLATION OF CLORPACTIN;  Surgeon: [name], MD;  Location: [facility];  Service: Urology;  Laterality: N/A;   CYSTO WITH HYDRODISTENSION N/A 03/06/2018   Procedure: CYSTOSCOPY/HYDRODISTENSION INSTILL CHLORPACTION;  Surgeon: [name], MD;  Location: [facility];  Service: Urology;  Laterality: N/A;   CYSTO WITH HYDRODISTENSION N/A 04/02/2019   Procedure: CYSTOSCOPY/HYDRODISTENSION INSTILL CHLORPACTIN;  Surgeon: [name], MD;  [facility];  Service: Urology;  Laterality: N/A;   CYSTO WITH HYDRODISTENSION N/A 02/17/2020   Procedure: CYSTOSCOPY/HYDRODISTENTION WITH INSTILLATION OF CHLORPACTIN;  Surgeon: [name], MD;  Location: [facility];  Service: Urology;   Laterality: N/A;  ONLY NEEDS 30 MIN   CYSTO WITH HYDRODISTENSION N/A 02/28/2021   Procedure: CYSTOSCOPY/HYDRODISTENSION INSTILL CLORPACTION;  Surgeon: [name], MD;  Location[facility];  Service: Urology;  Laterality: N/A;   CYSTOSCOPY WITH URETHRAL DILATATION  07/12/2011   Procedure: CYSTOSCOPY WITH URETHRAL DILATATION;  Surgeon: Malka So, MD;  Location: [place];  Service: Urology;  Laterality: N/A;  HOD AND INSTILLATION OF CHLOROPACTIN C-ARM    CYSTOSCOPY WITH URETHRAL DILATATION N/A 06/26/2012   Procedure: CYSTOSCOPY WITH URETHRAL DILATATION HYDRODISTENSION AND INSTILLATION OF CLORPACTION ;  Surgeon: Malka So, MD;  Location: [facility];  Service: Urology;  Laterality: N/A;  CYSTOSCOPY WITH URETHRAL DILATATION HYDRODISTENSION AND INSTILLATION OF CLORPACTION    CYSTOSTOMY W/ BLADDER BIOPSY  1983   DG BARIUM SWALLOW ([place] HX)  03/2016   DILATION AND CURETTAGE OF UTERUS  1987   ERCP  1988   for pancreatitis with stents   S/P SEVERE PANCREATITIS   HEMORRHOID SURGERY     LASIK Bilateral APRIL 2005   LIPOSUCTION Bilateral 10/17/2018   Procedure: LIPOSUCTION;  Surgeon: Wallace Going, DO;  Location: [place];  Service: Plastics;  Laterality: Bilateral;   LIPOSUCTION Right 03/18/2019   Procedure: excision right necrotic breast tissue with liposuction;  Surgeon: Wallace Going, DO;  Location: [place];  Service: Plastics;  Laterality: Right;   LIPOSUCTION Bilateral 06/30/2020   Procedure: Bilateral liposuction of lateral breast;  Surgeon: Wallace Going, DO;  Location: [place];  Service: Plastics;  Laterality: Bilateral;   MULTIPLE CYSTO/ HOD/ URETHRAL DILATION/ INSTILLATION CLORPACTIN  .LAST ONE 03-31-2010   NASAL SINUS SURGERY  1977   sinus cyst   REDUCTION MAMMAPLASTY Bilateral 1988   REDUCTION MAMMAPLASTY Bilateral 09/2018   REDUCTION MAMMAPLASTY Bilateral 03/2019    revision rhinoplasty  [place]   VIDEO BRONCHOSCOPY Bilateral 08/23/2016   Procedure: VIDEO BRONCHOSCOPY WITHOUT FLUORO;  Surgeon: Javier Glazier, MD;  Location: [facility];  Service: Cardiopulmonary;  Laterality: Bilateral;   Social History   Social History Narrative   Married, lives with spouse   No children   RN at [employer]   No recent travel      [facility]:   Originally from [place]. Always lived in [place]. Previously has traveled to [place], [place], [place], [place], & mostly [place]. No indoor pets currently. She does have cats in her garage. No bird, mold, or hot tub exposure. No indoor plants. Mostly wood floors. She did have her bedroom carpet taken up in Summer 2017. Has mostly blinds. No wood burning fire place. Previously enjoyed Firefighter & playing piano.    family history includes Allergies in her mother; Asthma in her mother; Brain cancer in her father; Colon cancer in her cousin and maternal grandmother; Colon polyps in her father; Diabetes in her mother and sister; Heart disease in her father, mother, and sister; Irritable bowel syndrome in her mother and sister; Melanoma in her sister; Osteoarthritis in her father, mother, and sister; Stroke in her sister; Ulcerative colitis in her maternal uncle.   Review of Systems As per HPI, some insomnia urinary frequency, excessive urination and occasional leakage with IC.  Recent dyspnea issues cardiac work-up negative.  Coronary calcium 0.  Anxiety and depression issues at times fatigue some back and neck pain.  Sinus problems intermittently.  Otherwise negative.  Objective:   Physical Exam @BP  108/76    Pulse 64    Ht 4' 11.5" (1.511 m)    Wt 147 lb 2 oz (66.7 kg)    SpO2 98%    BMI 29.22 kg/m @  General:  Well-developed, well-nourished and in no acute distress Eyes:  anicteric. ENT:  Mouth and posterior pharynx free of  lesions.  Neck:   supple w/o thyromegaly or mass.  Lungs: Clear to auscultation bilaterally. Heart:  S1S2, no rubs, murmurs, gallops. Abdomen:  soft, non-tender, no hepatosplenomegaly, hernia, or mass and BS+.  Rectal:  Patti [place], [place] present.  Ant anal fissure with sentinel pile and mild anal stenosis, external hemorrhoids   Lymph:  no cervical or supraclavicular adenopathy. Extremities:   no edema, cyanosis or clubbing Skin   no rash. Neuro:  A&O x 3.  Psych:  appropriate mood and  Affect.   Data Reviewed: See HPI

## 2021-05-02 NOTE — Patient Instructions (Signed)
If you are age 70 or older, your body mass index should be between 23-30. Your Body mass index is 29.22 kg/m. If this is out of the aforementioned range listed, please consider follow up with your Primary Care Provider.  If you are age 83 or younger, your body mass index should be between 19-25. Your Body mass index is 29.22 kg/m. If this is out of the aformentioned range listed, please consider follow up with your Primary Care Provider.   ________________________________________________________  The [place] [facility] providers would like to encourage you to use [facility] to communicate with providers for non-urgent requests or questions.  Due to long hold times on the telephone, sending your provider a message by [facility] may be a faster and more efficient way to get a response.  Please allow 48 business hours for a response.  Please remember that this is for non-urgent requests.  _______________________________________________________   We are providing you with information about diet tips. Cut out sugar and processed foods.  We have faxed an rx to [facility] for you to pick up.  Follow up with [place] in 2 months.  I appreciate the opportunity to care for you. Silvano Rusk, MD, [facility]

## 2021-05-03 DIAGNOSIS — D2271 Melanocytic nevi of right lower limb, including hip: Secondary | ICD-10-CM | POA: Diagnosis not present

## 2021-05-03 DIAGNOSIS — L570 Actinic keratosis: Secondary | ICD-10-CM | POA: Diagnosis not present

## 2021-05-03 DIAGNOSIS — L720 Epidermal cyst: Secondary | ICD-10-CM | POA: Diagnosis not present

## 2021-05-03 DIAGNOSIS — D2261 Melanocytic nevi of right upper limb, including shoulder: Secondary | ICD-10-CM | POA: Diagnosis not present

## 2021-05-03 DIAGNOSIS — L821 Other seborrheic keratosis: Secondary | ICD-10-CM | POA: Diagnosis not present

## 2021-05-03 DIAGNOSIS — L814 Other melanin hyperpigmentation: Secondary | ICD-10-CM | POA: Diagnosis not present

## 2021-05-03 DIAGNOSIS — D2272 Melanocytic nevi of left lower limb, including hip: Secondary | ICD-10-CM | POA: Diagnosis not present

## 2021-05-03 DIAGNOSIS — D2262 Melanocytic nevi of left upper limb, including shoulder: Secondary | ICD-10-CM | POA: Diagnosis not present

## 2021-05-08 DIAGNOSIS — M47812 Spondylosis without myelopathy or radiculopathy, cervical region: Secondary | ICD-10-CM | POA: Diagnosis not present

## 2021-05-22 DIAGNOSIS — N1831 Chronic kidney disease, stage 3a: Secondary | ICD-10-CM | POA: Diagnosis not present

## 2021-05-22 DIAGNOSIS — I10 Essential (primary) hypertension: Secondary | ICD-10-CM | POA: Diagnosis not present

## 2021-05-22 DIAGNOSIS — N3010 Interstitial cystitis (chronic) without hematuria: Secondary | ICD-10-CM | POA: Diagnosis not present

## 2021-05-22 DIAGNOSIS — I129 Hypertensive chronic kidney disease with stage 1 through stage 4 chronic kidney disease, or unspecified chronic kidney disease: Secondary | ICD-10-CM | POA: Diagnosis not present

## 2021-05-26 DIAGNOSIS — N3010 Interstitial cystitis (chronic) without hematuria: Secondary | ICD-10-CM | POA: Diagnosis not present

## 2021-06-05 ENCOUNTER — Telehealth: Payer: Self-pay

## 2021-06-05 NOTE — Telephone Encounter (Signed)
Returned patients call. Yesterday her skin became red, swollen, extremely dry, raw feeling, and painful. She is currently uses ZO products for skin care. At this time she uses Vitamin C, Retinol Skin Brightener, sun screen, Daily Defense, and wrinkle cream. Advise to discontinue Retinol and use Cerave. Will set up a tele-visit with Dr. Marla Roe to discuss her skin irritation and product use.

## 2021-06-05 NOTE — Telephone Encounter (Signed)
Patient called she stated she is having problems with her skin care. Patient stated her face is breaking out , her face is burning and feels raw. Patient wants to know if she need to come in and be seen. Call [phone]

## 2021-06-19 ENCOUNTER — Other Ambulatory Visit: Payer: Self-pay

## 2021-06-19 ENCOUNTER — Ambulatory Visit (INDEPENDENT_AMBULATORY_CARE_PROVIDER_SITE_OTHER): Payer: PPO | Admitting: Plastic Surgery

## 2021-06-19 ENCOUNTER — Encounter: Payer: Self-pay | Admitting: Plastic Surgery

## 2021-06-19 DIAGNOSIS — Z719 Counseling, unspecified: Secondary | ICD-10-CM

## 2021-06-19 NOTE — Progress Notes (Signed)
The patient is interested in the BBL.  I think this is a good idea for her.  The Vit C was probably hard on her skin.  She is doing better now that she stopped it and the retinal.  We will plan to see her in next few weeks.

## 2021-06-23 DIAGNOSIS — N3010 Interstitial cystitis (chronic) without hematuria: Secondary | ICD-10-CM | POA: Diagnosis not present

## 2021-06-26 DIAGNOSIS — S61411A Laceration without foreign body of right hand, initial encounter: Secondary | ICD-10-CM | POA: Diagnosis not present

## 2021-07-06 ENCOUNTER — Ambulatory Visit: Payer: PPO | Admitting: Internal Medicine

## 2021-07-07 DIAGNOSIS — Z4802 Encounter for removal of sutures: Secondary | ICD-10-CM | POA: Diagnosis not present

## 2021-07-18 ENCOUNTER — Other Ambulatory Visit: Payer: Self-pay

## 2021-07-18 ENCOUNTER — Encounter: Payer: Self-pay | Admitting: Plastic Surgery

## 2021-07-18 ENCOUNTER — Ambulatory Visit: Payer: PPO | Admitting: Plastic Surgery

## 2021-07-18 DIAGNOSIS — Z719 Counseling, unspecified: Secondary | ICD-10-CM

## 2021-07-18 DIAGNOSIS — Z9889 Other specified postprocedural states: Secondary | ICD-10-CM

## 2021-07-18 NOTE — Progress Notes (Signed)
The patient is a 71 year old female here for follow-up on her breast surgery and nose excision.  Overall she is doing really well.  There is no sign of action.  She is healed we will plan on doing a BBL laser treatment for her in April. ?

## 2021-07-21 DIAGNOSIS — N3010 Interstitial cystitis (chronic) without hematuria: Secondary | ICD-10-CM | POA: Diagnosis not present

## 2021-07-24 ENCOUNTER — Encounter: Payer: Self-pay | Admitting: Internal Medicine

## 2021-07-24 ENCOUNTER — Ambulatory Visit: Payer: PPO | Admitting: Internal Medicine

## 2021-07-24 VITALS — BP 122/70 | HR 62 | Ht 59.5 in | Wt 152.0 lb

## 2021-07-24 DIAGNOSIS — R1031 Right lower quadrant pain: Secondary | ICD-10-CM

## 2021-07-24 DIAGNOSIS — K582 Mixed irritable bowel syndrome: Secondary | ICD-10-CM

## 2021-07-24 DIAGNOSIS — K602 Anal fissure, unspecified: Secondary | ICD-10-CM

## 2021-07-24 DIAGNOSIS — E65 Localized adiposity: Secondary | ICD-10-CM

## 2021-07-24 NOTE — Progress Notes (Signed)
? ?Brenda [name] y.o. 1950-06-05 [id number] ? ?Assessment & Plan:  ? ?Encounter Diagnoses  ?Name Primary?  ? Anterior anal fissure Yes  ? Irritable bowel syndrome with both constipation and diarrhea   ? RLQ abdominal pain   ? Abdominal obesity   ? ?The fissure is improved.  Irritable bowel syndrome and left lower quadrant pain persist these are not different.  Next routine colonoscopy 2027. ? ?She is going to go to the healthy weight and wellness clinic in an effort to try to lose weight.  I still think carbohydrate reduction in her diet and reduction/elimination of processed foods would be helpful.  She has had a beautiful response to Repatha with respect to LDL and APO B but her triglycerides were 177  last year and this is a sign of carbohydrate excess in diet. ? ? ? ? ?Subjective:  ? ?Chief Complaint: anal fissure, RLQ pain ? ?HPI ?71 year old white woman seen on May 02, 2021 with an anterior anal fissure, also with chronic intermittent IBS symptoms and right lower quadrant pain.  Please see the note of 05/02/2021 for further details. ? ?She reports improvement in her fissure symptoms.  Pain is better and only minimal bleeding after defecation.  She was treated with diltiazem lidocaine cream.  Still having some right lower quadrant abdominal pain described as achy and intermittent which is a chronic phenomenon.  She had bladder treatment for interstitial cystitis last week.  She has been excepted into the healthy weight and wellness clinic, trying to lose abdominal fat.  We did discuss lower carbohydrate and reduction of processed foods last visit. ? ?Wt Readings from Last 3 Encounters:  ?07/24/21 152 lb (68.9 kg)  ?05/02/21 147 lb 2 oz (66.7 kg)  ?03/21/21 147 lb 3.2 oz (66.8 kg)  ? ? ?Allergies  ?Allergen Reactions  ? Codeine Other (See Comments)  ?  SEVERE ELEVATED LIVER ENZYMES  ? Latex Rash  ? Stadol [Butorphanol Tartrate] Swelling  ?  Lips numb, tongue swell, tachycardia  ? Adhesive [Tape] Other  (See Comments)  ?  TEARS SKIN; PLEASE USE COBAN WRAP!!  ? Bupivacaine Other (See Comments)  ?  Other Reaction: PRE-SYNCOPE, TACTYCARDIA ?TACHYCARDIA/ PRESYNCOPE  ? Butorphanol Tartrate Other (See Comments)  ?  NUMBNESS, TACHYCARDIA  ? Clarithromycin Nausea Only and Other (See Comments)  ?  TACHYCARDIA  ? Cymbalta [Duloxetine Hcl] Other (See Comments)  ?  Blurred vision  ? Dilaudid [Hydromorphone Hcl] Itching  ?  SEVERE ITCHING  ? Elavil [Amitriptyline Hcl] Other (See Comments)  ?  Elevated liver enzymes  ? Hydrocodone Other (See Comments)  ?  Elevated liver enzymes  ? Levbid [Hyoscyamine Sulfate] Other (See Comments)  ?  Dizziness, nausea, headache  ? Lisinopril Cough  ?  Dry cough  ? Pentazocine Lactate Other (See Comments)  ?  Tachycardia ?  ? Percodan [Oxycodone-Aspirin] Nausea Only  ? Trazodone And Nefazodone Other (See Comments)  ?  Tachycardia ?  ? Vistaril [Hydroxyzine Hcl] Other (See Comments)  ?  Altered sensorium  ? Morphine And Related Itching and Rash  ?  Elevate liver enzymes  ? ?Current Meds  ?Medication Sig  ? acetaminophen (TYLENOL) 325 MG tablet Take 500 mg by mouth every 6 (six) hours as needed (for headaches).   ? albuterol (VENTOLIN HFA) 108 (90 Base) MCG/ACT inhaler Inhale into the lungs every 6 (six) hours as needed for wheezing or shortness of breath.  ? AMBULATORY NON FORMULARY MEDICATION Medication Name: Diltiazem 2% gel mixed with  Lidocaine 5% ?Apply a pea size amount to rectum 2-3 times daily.  ? Calcium Carb-Cholecalciferol (CALCIUM + D3) 600-800 MG-UNIT TABS Take 1 tablet by mouth 2 (two) times daily as needed.  ? cyclobenzaprine (FLEXERIL) 10 MG tablet Take 10 mg by mouth 3 (three) times daily as needed.  ? diltiazem 2 % GEL Apply 1 application topically 2 (two) times daily as needed (rectal pain).  ? ezetimibe (ZETIA) 10 MG tablet Take 10 mg by mouth every evening.  ? famotidine (PEPCID) 20 MG tablet Take 20 mg by mouth at bedtime as needed.  ? FLUoxetine (PROZAC) 10 MG capsule Take 1  capsule (10 mg total) by mouth daily.  ? fluticasone (FLONASE) 50 MCG/ACT nasal spray Place 2 sprays into both nostrils daily as needed.  ? folic acid (FOLVITE) 1 MG tablet Take 1 mg by mouth daily.  ? LORazepam (ATIVAN) 1 MG tablet TAKE 1 TABLET EVERY 8 HOURS AS NEEDED FOR ANXIETY.  ? losartan (COZAAR) 100 MG tablet Take 100 mg by mouth daily.  ? meperidine (DEMEROL) 25 MG/ML injection Every 4 weeks  ? metoprolol (LOPRESSOR) 50 MG tablet Take 25 mg by mouth 2 (two) times daily.  ? pantoprazole (PROTONIX) 40 MG tablet Take 40 mg by mouth daily.  ? REPATHA SURECLICK 294 MG/ML SOAJ Inject SQ into the abdomen every 2 weeks ?Pt reports taking every 2 weeks on 02/10/20  ? Vitamin D, Ergocalciferol, (DRISDOL) 50000 UNITS CAPS Take 50,000 Units by mouth every 7 (seven) days.  ? zolpidem (AMBIEN) 5 MG tablet TAKE 1 TABLET AT BEDTIME AS NEEDED FOR INSOMNIA.  ? ?Past Medical History:  ?Diagnosis Date  ? Anxiety   ? Arthritis   ? Bladder pain   ? Cervical disc disease   ? THINKS C 4 TO C 5  ? Chronic cough   ? followed by dr wert and ENT dr Carol Ada  ? Chronic kidney disease (CKD), stage III (moderate) (HCC)   ? Chronic seasonal allergic rhinitis   ? Depression   ? Dyspnea   ? with exertion   ? Fibromyalgia   ? Frequency of urination   ? GERD (gastroesophageal reflux disease)   ? Heart murmur   ? per pt pcp only hear's murmur occasionally, asymptomatic;  pt evaluated by cardiologist--- dr Sallyanne Kuster 11-10-2020 note in epic, echo normal with trivial MR/ TR  ? History of pancreatitis 1988-1989  ? History of panic attacks   ? History of stroke 1996  ? mild without residuals;  per MRI imaging 2010 left thalamic infarct without residual  ? Hyperlipidemia   ? Hypertension LABILE  ? followed by pcp  ? IBS (irritable bowel syndrome)   ? Interstitial cystitis   ? urologist-- dr Jeffie Pollock  ? Lumbar spondylosis   ? Migraines   ? Moderate persistent asthma   ? followed by pcp and dr wert   ? Multiple pulmonary nodules   ? pulmonologist--  dr wert  ? Normal cardiac stress test JULY 2009---  NORMAL  ? PONV (postoperative nausea and vomiting)   ? Spasm of sphincter of Oddi 1987  ? 02-13-2019  per pt no issues since she watches what medication's she takes  ? Urethral stenosis   ? s/p multiple dilatations  ? Urgency of urination   ? ?Past Surgical History:  ?Procedure Laterality Date  ? ABDOMINAL HYSTERECTOMY  1988  ? COMPLETE  ? APPENDECTOMY  04/ 1986  ? bilateral reduction mastopexy  1988  ? bilateral turbinate resection  1990  ?  nasal spreading graft  ? BREAST CYST EXCISION Bilateral 03/18/2019  ? Procedure: excision necrotic breast tissue left ;  Surgeon: Wallace Going, DO;  Location: [place];  Service: Plastics;  Laterality: Bilateral;  ? BREAST CYST EXCISION Bilateral 06/30/2020  ? Procedure: Excision of right breast fat necrosis and left inferior breast;  Surgeon: Wallace Going, DO;  Location: [place];  Service: Plastics;  Laterality: Bilateral;  ? BREAST REDUCTION SURGERY  10/17/2018  ? Procedure: bilateral breast reduction;  Surgeon: Wallace Going, DO;  Location: [place];  Service: Plastics;;  please adjust time to reflect 4 hours  ? CATARACT EXTRACTION W/ INTRAOCULAR LENS  IMPLANT, BILATERAL  08 and 09/ 2017  ? CHOLECYSTECTOMY  04/ 1986  ? COLONOSCOPY  02/2004, 11/2010, LAST 03-2016 ENDO DONE ALSO  ? 2005: Normal - Dr. Sammuel Cooper 2012: Normal  ? CYSTO WITH HYDRODISTENSION N/A 12/17/2013  ? Procedure: CYSTO HYDRODISTENSION WITH INSTALLATION OF CHLOROPACTIN AND TETRACAINE;  Surgeon: Malka So, MD;  Location: [facility];  Service: Urology;  Laterality: N/A;  ? CYSTO WITH HYDRODISTENSION N/A 06/02/2015  ? Procedure: CYSTOSCOPY/HYDRODISTENSION WITH INSTILLATION OF CLORPACTIN ;  Surgeon: [name], MD;  Location: [facility];  Service: Urology;  Laterality: N/A;  ? CYSTO WITH HYDRODISTENSION N/A 11/08/2016  ? Procedure: CYSTOSCOPY/HYDRODISTENSION  INSTILLATION OF CLORPACTIN;  Surgeon: [name], MD;  Location: [facility];  Service: Urology;  Laterality: N/A;  ? CYSTO WITH HYDRODISTENSION N/A 03/06/2018  ? Procedure: CYSTOSCOPY/HYDROD

## 2021-07-24 NOTE — Patient Instructions (Addendum)
Use your fissure cream until no symptoms for a month. ? ?Due to recent changes in healthcare laws, you may see the results of your imaging and laboratory studies on MyChart before your provider has had a chance to review them.  We understand that in some cases there may be results that are confusing or concerning to you. Not all laboratory results come back in the same time frame and the provider may be waiting for multiple results in order to interpret others.  Please give [place] 48 hours in order for your provider to thoroughly review all the results before contacting the office for clarification of your results.  ? ?I appreciate the opportunity to care for you. ?Silvano Rusk, MD, [facility] ?

## 2021-07-27 DIAGNOSIS — Z0289 Encounter for other administrative examinations: Secondary | ICD-10-CM

## 2021-08-03 ENCOUNTER — Encounter (INDEPENDENT_AMBULATORY_CARE_PROVIDER_SITE_OTHER): Payer: Self-pay | Admitting: Bariatrics

## 2021-08-03 ENCOUNTER — Ambulatory Visit (INDEPENDENT_AMBULATORY_CARE_PROVIDER_SITE_OTHER): Payer: PPO | Admitting: Bariatrics

## 2021-08-03 VITALS — BP 132/81 | HR 59 | Temp 97.7°F | Ht 59.0 in | Wt 147.0 lb

## 2021-08-03 DIAGNOSIS — Z1331 Encounter for screening for depression: Secondary | ICD-10-CM | POA: Diagnosis not present

## 2021-08-03 DIAGNOSIS — R0602 Shortness of breath: Secondary | ICD-10-CM

## 2021-08-03 DIAGNOSIS — E669 Obesity, unspecified: Secondary | ICD-10-CM | POA: Diagnosis not present

## 2021-08-03 DIAGNOSIS — R5383 Other fatigue: Secondary | ICD-10-CM

## 2021-08-03 DIAGNOSIS — E782 Mixed hyperlipidemia: Secondary | ICD-10-CM | POA: Diagnosis not present

## 2021-08-03 DIAGNOSIS — I10 Essential (primary) hypertension: Secondary | ICD-10-CM

## 2021-08-03 DIAGNOSIS — Z6829 Body mass index (BMI) 29.0-29.9, adult: Secondary | ICD-10-CM

## 2021-08-03 DIAGNOSIS — K219 Gastro-esophageal reflux disease without esophagitis: Secondary | ICD-10-CM | POA: Diagnosis not present

## 2021-08-03 DIAGNOSIS — E559 Vitamin D deficiency, unspecified: Secondary | ICD-10-CM | POA: Diagnosis not present

## 2021-08-03 DIAGNOSIS — Z8639 Personal history of other endocrine, nutritional and metabolic disease: Secondary | ICD-10-CM | POA: Diagnosis not present

## 2021-08-03 DIAGNOSIS — R7303 Prediabetes: Secondary | ICD-10-CM

## 2021-08-04 LAB — HEMOGLOBIN A1C
Est. average glucose Bld gHb Est-mCnc: 128 mg/dL
Hgb A1c MFr Bld: 6.1 % — ABNORMAL HIGH (ref 4.8–5.6)

## 2021-08-04 LAB — COMPREHENSIVE METABOLIC PANEL
ALT: 38 IU/L — ABNORMAL HIGH (ref 0–32)
AST: 36 IU/L (ref 0–40)
Albumin/Globulin Ratio: 2.1 (ref 1.2–2.2)
Albumin: 4.5 g/dL (ref 3.8–4.8)
Alkaline Phosphatase: 112 IU/L (ref 44–121)
BUN/Creatinine Ratio: 15 (ref 12–28)
BUN: 17 mg/dL (ref 8–27)
Bilirubin Total: 0.5 mg/dL (ref 0.0–1.2)
CO2: 23 mmol/L (ref 20–29)
Calcium: 10.5 mg/dL — ABNORMAL HIGH (ref 8.7–10.3)
Chloride: 105 mmol/L (ref 96–106)
Creatinine, Ser: 1.16 mg/dL — ABNORMAL HIGH (ref 0.57–1.00)
Globulin, Total: 2.1 g/dL (ref 1.5–4.5)
Glucose: 95 mg/dL (ref 70–99)
Potassium: 4.8 mmol/L (ref 3.5–5.2)
Sodium: 147 mmol/L — ABNORMAL HIGH (ref 134–144)
Total Protein: 6.6 g/dL (ref 6.0–8.5)
eGFR: 51 mL/min/{1.73_m2} — ABNORMAL LOW (ref >59)

## 2021-08-04 LAB — LIPID PANEL WITH LDL/HDL RATIO
Cholesterol, Total: 145 mg/dL (ref 100–199)
HDL: 73 mg/dL (ref >39)
LDL Chol Calc (NIH): 55 mg/dL (ref 0–99)
LDL/HDL Ratio: 0.8 ratio (ref 0.0–3.2)
Triglycerides: 91 mg/dL (ref 0–149)
VLDL Cholesterol Cal: 17 mg/dL (ref 5–40)

## 2021-08-04 LAB — INSULIN, RANDOM: INSULIN: 4.9 u[IU]/mL (ref 2.6–24.9)

## 2021-08-04 LAB — VITAMIN D 25 HYDROXY (VIT D DEFICIENCY, FRACTURES): Vit D, 25-Hydroxy: 90.7 ng/mL (ref 30.0–100.0)

## 2021-08-04 NOTE — Progress Notes (Signed)
? ? ? ?Chief Complaint:  ? ?OBESITY ?Brenda [name] (MR# [id number]) is a 71 y.o. female who presents for evaluation and treatment of obesity and related comorbidities. Current BMI is Body mass index is 29.69 kg/m?Marland Kitchen Brenda [name] has been struggling with her weight for many years and has been unsuccessful in either losing weight, maintaining weight loss, or reaching her healthy weight goal. ? ?Brenda [name] does not usually like to cook. She is not a "picky eater". ? ?Brenda [name] is currently in the action stage of change and ready to dedicate time achieving and maintaining a healthier weight. Brenda [name] is interested in becoming our patient and working on Brenda [name] lifestyle modifications including (but not limited to) diet and exercise for weight loss. ? ?Brenda [name]'s habits were reviewed today and are as follows: Her family eats meals together, she thinks her family will eat healthier with her, her desired weight loss is 27-32 pounds, she started gaining weight around 2012, her heaviest weight ever was 152 pounds, she has significant food cravings issues, she skips meals frequently, she is frequently drinking liquids with calories, she frequently makes poor food choices, she frequently eats larger portions than normal, and she struggles with emotional eating. ? ?Depression Screen ?Brenda [name]'s Food and Mood (modified PHQ-9) score was 14. ? ? ?  08/03/2021  ?  7:43 AM  ?Depression screen PHQ 2/9  ?Decreased Interest 3  ?Down, Depressed, Hopeless 3  ?PHQ - 2 Score 6  ?Altered sleeping 2  ?Tired, decreased energy 3  ?Change in appetite 1  ?Feeling bad or failure about yourself  1  ?Trouble concentrating 1  ?Moving slowly or fidgety/restless 0  ?Suicidal thoughts 0  ?PHQ-9 Score 14  ?Difficult doing work/chores Somewhat difficult  ? ?Subjective:  ? ?1. Other fatigue ?Brenda [name] continue activities. Brenda [name] admits to daytime somnolence and admits to waking up still tired. Patient has a history of symptoms of morning fatigue and morning headache. Brenda [name]  generally gets 7 or 8 hours of sleep per night, and states that she has difficulty falling asleep and generally restful sleep. Snoring is present. Apneic episodes are not present. Epworth Sleepiness Score is 5.   ? ?2. SOB (shortness of breath) ?Brenda [name] will continue activities. Brenda [name] notes increasing shortness of breath with exercising and seems to be worsening over time with weight gain. She notes getting out of breath sooner with activity than she used to. This has not gotten worse recently. Brenda [name] denies shortness of breath at rest or orthopnea.  ? ?3. Essential hypertension ?Brenda [name]'s blood pressure is reasonably well controlled 132/81 ? ?4. Gastroesophageal reflux disease, unspecified whether esophagitis present ?Brenda [name] is currently taking pantoprazole. ? ?5. Mixed hyperlipidemia ?Brenda [name] is taking Repatha, Omega and Zetia currently. ? ?6. Vitamin D deficiency ?Brenda [name] is taking high dose Vitamin D, probiotic, prenatal vitamins, and Vitamin C.  ? ?7. Pre-diabetes ?Brenda [name] is not on medications currently. Her last A1C was 5.8. ? ?Assessment/Plan:  ? ?1. Other fatigue ?Brenda [name] will gradually increase activities and exercise. Brenda [name] does feel that her weight is causing her energy to be lower than it should be. Fatigue may be related to obesity, depression or many other causes. Labs will be ordered, and in the meanwhile, Brenda [name] will focus on self care including making healthy food choices, increasing physical activity and focusing on stress reduction.  ? ?2. SOB (shortness of breath) ?Brenda [name] will gradually increase activities and exercise. Brenda [name] does feel that she gets out of breath more easily that she used to when she exercises. Brenda [name]'s shortness of breath  appears to be obesity related and exercise induced. She has agreed to work on weight loss and gradually increase exercise to treat her exercise induced shortness of breath. Will continue to monitor closely.  ? ?3. Essential hypertension ?Brenda [name] will continue her  medications. She is working on healthy weight loss and exercise to improve blood pressure control. We will watch for signs of hypotension as she continues her lifestyle modifications. ? ?4. Gastroesophageal reflux disease, unspecified whether esophagitis present ?Brenda [name] lifestyle modifications are the first line treatment for this issue. Brenda [name] will continue medications. She will avoid her triggers. We discussed several lifestyle modifications today and she will continue to work on diet, exercise and weight loss efforts. Orders and follow up as documented in patient record.  ? ?Counseling ?If a person has gastroesophageal reflux disease (GERD), food and stomach acid move back up into the esophagus and cause symptoms or problems such as damage to the esophagus. ?Anti-reflux measures include: raising the head of the bed, avoiding tight clothing or belts, avoiding eating late at night, not lying down shortly after mealtime, and achieving weight loss. ?Avoid ASA, NSAID's, caffeine, alcohol, and tobacco.  ?OTC Pepcid and/or Tums are often very helpful for as needed use.  ?However, for persisting chronic or daily symptoms, stronger medications like Omeprazole may be needed. ?You may need to avoid foods and drinks such as: ?Coffee and tea (with or without caffeine). ?Drinks that contain alcohol. ?Energy drinks and sports drinks. ?Bubbly (carbonated) drinks or sodas. ?Chocolate and cocoa. ?Peppermint and mint flavorings. ?Garlic and onions. ?Horseradish. ?Spicy and acidic foods. These include peppers, chili powder, curry powder, vinegar, hot sauces, and BBQ sauce. ?Citrus fruit juices and citrus fruits, such as oranges, lemons, and limes. ?Tomato-based foods. These include red sauce, chili, salsa, and pizza with red sauce. ?Fried and fatty foods. These include donuts, french fries, potato chips, and high-fat dressings. ?High-fat meats. These include hot dogs, rib eye steak, sausage, ham, and bacon.  ? ?5. Mixed  hyperlipidemia ? Brenda [name] will continue her medications. She will read labels for trans fats and saturated fats. We will check lipid panel today. She will continue to work on weight loss, exercise, and decreasing simple carbohydrates to help decrease the risk of diabetes. Dareth agreed to follow-up with [place] as directed to closely monitor her progress. ? ?- Lipid Panel With LDL/HDL Ratio ? ?6. Vitamin D deficiency ? Low Vitamin D level contributes to fatigue and are associated with obesity, breast, and colon cancer. We will check Vitamin D today and she will follow-up for routine testing of Vitamin D, at least 2-3 times per year to avoid over-replacement. ? ?- VITAMIN D 25 Hydroxy (Vit-D Deficiency, Fractures) ? ?7. Pre-diabetes ?We will check A1C and insulin today. Kendrick will continue to work on weight loss, exercise, and decreasing simple carbohydrates to help decrease the risk of diabetes.  ? ?- Insulin, random ?- Comprehensive metabolic panel ?- Hemoglobin A1c ? ?8. Depression screen ?Dudley had a positive depression screening. Depression is commonly associated with obesity and often results in emotional eating behaviors. We will monitor this closely and work on CBT to help improve the non-hunger eating patterns. Referral to Psychology may be required if no improvement is seen as she continues in our clinic.  ? ?9. Obesity, current BMI 29.8 ?Helana is currently in the action stage of change and her goal is to continue with weight loss efforts. I recommend Shylie begin the structured treatment plan as follows: ? ?Appollonia will continue meal planning. We  reviewed her last labs from 02/28/2021 BMP, CBC, and glucose. She will not skip meals.  ? ?She has agreed to keeping a food journal and adhering to recommended goals of 1,000 calories and 70 grams of protein. ? ?Exercise goals: No exercise has been prescribed at this time.  ? ?Behavioral modification strategies: increasing lean protein intake, decreasing simple  carbohydrates, increasing vegetables, increasing water intake, decreasing eating out, no skipping meals, meal planning and cooking strategies, keeping healthy foods in the home, and planning for success. ? ?She was in

## 2021-08-07 ENCOUNTER — Encounter (INDEPENDENT_AMBULATORY_CARE_PROVIDER_SITE_OTHER): Payer: Self-pay | Admitting: Bariatrics

## 2021-08-08 ENCOUNTER — Encounter: Payer: Self-pay | Admitting: Plastic Surgery

## 2021-08-08 ENCOUNTER — Encounter (INDEPENDENT_AMBULATORY_CARE_PROVIDER_SITE_OTHER): Payer: Self-pay | Admitting: Bariatrics

## 2021-08-08 ENCOUNTER — Ambulatory Visit (INDEPENDENT_AMBULATORY_CARE_PROVIDER_SITE_OTHER): Payer: Self-pay | Admitting: Plastic Surgery

## 2021-08-08 DIAGNOSIS — Z719 Counseling, unspecified: Secondary | ICD-10-CM

## 2021-08-08 NOTE — Progress Notes (Signed)
[place] ? ?Preoperative Dx: hyperpigmentation of face ? ?Postoperative Dx:  same ? ?Procedure: laser to face  ? ?Anesthesia: none ? ?Description of Procedure:  ?Risks and complications were explained to the patient. Consent was confirmed and signed. Eye protection was placed. Time out was called and all information was confirmed to be correct. The area  area was prepped with alcohol and wiped dry. The BBL laser was set at 590 nm and 7 J/cm2. Then the 515 nm and 6 J. The face was lasered. The patient tolerated the procedure well and there were no complications. The patient is to follow up in 4 weeks. ? ? ?

## 2021-08-10 ENCOUNTER — Other Ambulatory Visit: Payer: Self-pay | Admitting: Psychiatry

## 2021-08-10 DIAGNOSIS — F5105 Insomnia due to other mental disorder: Secondary | ICD-10-CM

## 2021-08-17 ENCOUNTER — Encounter (INDEPENDENT_AMBULATORY_CARE_PROVIDER_SITE_OTHER): Payer: Self-pay | Admitting: Family Medicine

## 2021-08-17 ENCOUNTER — Ambulatory Visit (INDEPENDENT_AMBULATORY_CARE_PROVIDER_SITE_OTHER): Payer: PPO | Admitting: Family Medicine

## 2021-08-17 VITALS — BP 119/72 | HR 64 | Temp 97.8°F | Ht 59.0 in | Wt 144.0 lb

## 2021-08-17 DIAGNOSIS — R7401 Elevation of levels of liver transaminase levels: Secondary | ICD-10-CM

## 2021-08-17 DIAGNOSIS — E669 Obesity, unspecified: Secondary | ICD-10-CM | POA: Diagnosis not present

## 2021-08-17 DIAGNOSIS — N183 Chronic kidney disease, stage 3 unspecified: Secondary | ICD-10-CM | POA: Insufficient documentation

## 2021-08-17 DIAGNOSIS — R7303 Prediabetes: Secondary | ICD-10-CM | POA: Diagnosis not present

## 2021-08-17 DIAGNOSIS — E559 Vitamin D deficiency, unspecified: Secondary | ICD-10-CM

## 2021-08-17 DIAGNOSIS — Z6829 Body mass index (BMI) 29.0-29.9, adult: Secondary | ICD-10-CM

## 2021-08-18 DIAGNOSIS — N3010 Interstitial cystitis (chronic) without hematuria: Secondary | ICD-10-CM | POA: Diagnosis not present

## 2021-08-29 DIAGNOSIS — E785 Hyperlipidemia, unspecified: Secondary | ICD-10-CM | POA: Diagnosis not present

## 2021-08-29 DIAGNOSIS — I10 Essential (primary) hypertension: Secondary | ICD-10-CM | POA: Diagnosis not present

## 2021-08-29 DIAGNOSIS — R7301 Impaired fasting glucose: Secondary | ICD-10-CM | POA: Diagnosis not present

## 2021-08-29 DIAGNOSIS — E559 Vitamin D deficiency, unspecified: Secondary | ICD-10-CM | POA: Diagnosis not present

## 2021-08-29 NOTE — Progress Notes (Signed)
Chief Complaint:   OBESITY Brenda [name] is here to discuss her progress with her obesity treatment plan along with follow-up of her obesity related diagnoses. Brenda [name] is on keeping a food journal and adhering to recommended goals of 1,000 calories and 70 protein and states she is following her eating plan approximately 75-80% of the time. Brenda [name] states she is using her total gym and walking 10 minutes 3 times per week.  Today's visit was #: 2 Starting weight: 147 lbs Starting date: 08/03/2021 Today's weight: 144 lbs Today's date: 08/17/2021 Total lbs lost to date: 3 Total lbs lost since last in-office visit: 3  Interim History: Brenda [name] voices that she did not use my fitness pal due to concern of over premium cost.  Brenda [name] recognizes she does eat out frequently as her husband won't eat unless they eat out.  She is writing down her food.  Brenda [name] is often ending up at 800 calories or less for the day with 70 grams of protein.  Brenda [name] hasn't had any sweet tea in 2 weeks.  Subjective:   1. Stage 3 chronic kidney disease, unspecified whether stage 3a or 3b CKD (HCC) Creatinine 1.16.  Riana does not have a nephrologist, but works with urology.  2. Transaminitis ALT very minimally elevated. Algie has a history of significant Transaminitis with LFT's in 2,000's.  3. Vitamin D deficiency Brenda [name] is still taking Vitamin D 50,000 IU/weekly.  Brenda [name] was previously on Calcium+Vitamin D supplement.   4. Prediabetes A1c 6.1; Insulin 4.9 Brenda [name] has a history of numerous bouts of pancreatitis due to gallstones.  Brenda [name] is currently not on any medications.     Assessment/Plan:   1. Stage 3 chronic kidney disease, unspecified whether stage 3a or 3b CKD ([place]) Linnell needs to follow up with urology and PCP for further management.  2. Transaminitis We will follow up with LFT's in 3 months.  3. Vitamin D deficiency Perl is to stop prescription Vitamin D and start OTC Vitamin D 2,000 IU/daily.    4. Prediabetes We will repeat labs in 3 months.  5. Obesity, current BMI 29.1 Yexalen is currently in the action stage of change. As such, her goal is to continue with weight loss efforts. She has agreed to keeping a food journal and adhering to recommended goals of 1,000 calories and 70+ protein daily.   Exercise goals: No exercise has been prescribed at this time.  Behavioral modification strategies: increasing lean protein intake, meal planning and cooking strategies, keeping healthy foods in the home, and keeping a strict food journal.  Brenda [name] has agreed to follow-up with our clinic in 3 weeks. She was informed of the importance of frequent follow-up visits to maximize her success with intensive lifestyle modifications for her multiple health conditions.  Objective:   Blood pressure 119/72, pulse 64, temperature 97.8 F (36.6 C), height '4\' 11"'$  (1.499 m), weight 144 lb (65.3 kg), SpO2 99 %. Body mass index is 29.08 kg/m.  General: Cooperative, alert, well developed, in no acute distress. HEENT: Conjunctivae and lids unremarkable. Cardiovascular: Regular rhythm.  Lungs: Normal work of breathing. Neurologic: No focal deficits.   Lab Results  Component Value Date   CREATININE 1.16 (H) 08/03/2021   BUN 17 08/03/2021   NA 147 (H) 08/03/2021   K 4.8 08/03/2021   CL 105 08/03/2021   CO2 23 08/03/2021   Lab Results  Component Value Date   ALT 38 (H) 08/03/2021   AST 36 08/03/2021   ALKPHOS 112 08/03/2021  BILITOT 0.5 08/03/2021   Lab Results  Component Value Date   HGBA1C 6.1 (H) 08/03/2021   Lab Results  Component Value Date   INSULIN 4.9 08/03/2021   No results found for: TSH Lab Results  Component Value Date   CHOL 145 08/03/2021   HDL 73 08/03/2021   LDLCALC 55 08/03/2021   TRIG 91 08/03/2021   Lab Results  Component Value Date   VD25OH 90.7 08/03/2021   Lab Results  Component Value Date   WBC 7.7 08/12/2017   HGB 15.3 (H) 02/28/2021   HCT 45.0  02/28/2021   MCV 93.1 08/12/2017   PLT 327.0 08/12/2017   No results found for: IRON, TIBC, FERRITIN  Obesity Behavioral Intervention:   Approximately 15 minutes were spent on the discussion below.  ASK: We discussed the diagnosis of obesity with Brenda [name] today and Karolynn agreed to give [place] permission to discuss obesity behavioral modification therapy today.  ASSESS: Snigdha has the diagnosis of obesity and her BMI today is 29.1. Joselin is in the action stage of change.   ADVISE: Kealey was educated on the multiple health risks of obesity as well as the benefit of weight loss to improve her health. She was advised of the need for long term treatment and the importance of lifestyle modifications to improve her current health and to decrease her risk of future health problems.  AGREE: Multiple dietary modification options and treatment options were discussed and Genesis agreed to follow the recommendations documented in the above note.  ARRANGE: Tequila was educated on the importance of frequent visits to treat obesity as outlined per CMS and USPSTF guidelines and agreed to schedule her next follow up appointment today.  Attestation Statements:   Reviewed by clinician on day of visit: allergies, medications, problem list, medical history, surgical history, family history, social history, and previous encounter notes.  I, Davy Pique, am acting as transcriptionist for Coralie Common, MD.  I have reviewed the above documentation for accuracy and completeness, and I agree with the above. - Coralie Common, MD

## 2021-09-02 ENCOUNTER — Other Ambulatory Visit: Payer: Self-pay | Admitting: Psychiatry

## 2021-09-02 DIAGNOSIS — F4001 Agoraphobia with panic disorder: Secondary | ICD-10-CM

## 2021-09-02 DIAGNOSIS — F411 Generalized anxiety disorder: Secondary | ICD-10-CM

## 2021-09-05 ENCOUNTER — Other Ambulatory Visit: Payer: PPO | Admitting: Plastic Surgery

## 2021-09-05 DIAGNOSIS — R0789 Other chest pain: Secondary | ICD-10-CM | POA: Diagnosis not present

## 2021-09-05 DIAGNOSIS — Z1331 Encounter for screening for depression: Secondary | ICD-10-CM | POA: Diagnosis not present

## 2021-09-05 DIAGNOSIS — Z23 Encounter for immunization: Secondary | ICD-10-CM | POA: Diagnosis not present

## 2021-09-05 DIAGNOSIS — M8580 Other specified disorders of bone density and structure, unspecified site: Secondary | ICD-10-CM | POA: Diagnosis not present

## 2021-09-05 DIAGNOSIS — E785 Hyperlipidemia, unspecified: Secondary | ICD-10-CM | POA: Diagnosis not present

## 2021-09-05 DIAGNOSIS — R7301 Impaired fasting glucose: Secondary | ICD-10-CM | POA: Diagnosis not present

## 2021-09-05 DIAGNOSIS — M5030 Other cervical disc degeneration, unspecified cervical region: Secondary | ICD-10-CM | POA: Diagnosis not present

## 2021-09-05 DIAGNOSIS — K219 Gastro-esophageal reflux disease without esophagitis: Secondary | ICD-10-CM | POA: Diagnosis not present

## 2021-09-05 DIAGNOSIS — I129 Hypertensive chronic kidney disease with stage 1 through stage 4 chronic kidney disease, or unspecified chronic kidney disease: Secondary | ICD-10-CM | POA: Diagnosis not present

## 2021-09-05 DIAGNOSIS — Z1339 Encounter for screening examination for other mental health and behavioral disorders: Secondary | ICD-10-CM | POA: Diagnosis not present

## 2021-09-05 DIAGNOSIS — Z0000 Encounter for general adult medical examination without abnormal findings: Secondary | ICD-10-CM | POA: Diagnosis not present

## 2021-09-05 DIAGNOSIS — N1831 Chronic kidney disease, stage 3a: Secondary | ICD-10-CM | POA: Diagnosis not present

## 2021-09-06 ENCOUNTER — Ambulatory Visit (INDEPENDENT_AMBULATORY_CARE_PROVIDER_SITE_OTHER): Payer: Self-pay | Admitting: Plastic Surgery

## 2021-09-06 ENCOUNTER — Encounter: Payer: Self-pay | Admitting: Plastic Surgery

## 2021-09-06 ENCOUNTER — Encounter (INDEPENDENT_AMBULATORY_CARE_PROVIDER_SITE_OTHER): Payer: Self-pay | Admitting: Family Medicine

## 2021-09-06 ENCOUNTER — Ambulatory Visit (INDEPENDENT_AMBULATORY_CARE_PROVIDER_SITE_OTHER): Payer: PPO | Admitting: Family Medicine

## 2021-09-06 VITALS — BP 101/68 | HR 62 | Temp 97.4°F | Ht 59.0 in | Wt 144.0 lb

## 2021-09-06 DIAGNOSIS — R7303 Prediabetes: Secondary | ICD-10-CM

## 2021-09-06 DIAGNOSIS — Z6829 Body mass index (BMI) 29.0-29.9, adult: Secondary | ICD-10-CM

## 2021-09-06 DIAGNOSIS — E669 Obesity, unspecified: Secondary | ICD-10-CM

## 2021-09-06 DIAGNOSIS — Z719 Counseling, unspecified: Secondary | ICD-10-CM

## 2021-09-06 NOTE — Progress Notes (Signed)
[place] ?Preoperative Dx: redness and pigment ? ?Postoperative Dx:  same ? ?Procedure: laser to face  ? ?Anesthesia: none ? ?Description of Procedure:  ?Risks and complications were explained to the patient. Consent was confirmed and signed. Eye protection was placed. Time out was called and all information was confirmed to be correct. The area  area was prepped with alcohol and wiped dry. The BBL laser was set at 590 J/cm2 at 6.5 and 7 nm. The 515 nm was set at 6.5 J. The face was lasered. The patient tolerated the procedure well and there were no complications. The patient is to follow up in 4 weeks. ? ? ?

## 2021-09-08 DIAGNOSIS — H0014 Chalazion left upper eyelid: Secondary | ICD-10-CM | POA: Diagnosis not present

## 2021-09-15 DIAGNOSIS — N3010 Interstitial cystitis (chronic) without hematuria: Secondary | ICD-10-CM | POA: Diagnosis not present

## 2021-09-17 DIAGNOSIS — H0014 Chalazion left upper eyelid: Secondary | ICD-10-CM | POA: Diagnosis not present

## 2021-09-20 NOTE — Progress Notes (Signed)
Chief Complaint:   OBESITY Brenda [name] is here to discuss her progress with her obesity treatment plan along with follow-up of her obesity related diagnoses. Brenda [name] is on keeping a food journal and adhering to recommended goals of 1000 calories and 70+ grams of protein daily and states she is following her eating plan approximately 85% of the time. Brenda [name] states she is doing 0 minutes 0 times per week.  Today's visit was #: 3 Starting weight: 147 lbs Starting date: 08/03/2021 Today's weight: 144 lbs Today's date: 09/06/2021 Total lbs lost to date: 3 Total lbs lost since last in-office visit: 0  Interim History: Brenda [name] has been journaling, but her protein is not at goal. She would like more ideas of how to increase her protein with minimal meal planning and prepping.   Subjective:   1. Pre-diabetes Brenda [name] is working on her diet and weight loss. She is still struggling to lose her visceral fat. She is trying to journal and decrease simple carbohydrates.   Assessment/Plan:   1. Pre-diabetes Brenda [name] will continue with her diet and exercise, may  consider metformin in the future.   2. Obesity, Current BMI 29.2 Brenda [name] is currently in the action stage of change. As such, her goal is to continue with weight loss efforts. She has agreed to keeping a food journal and adhering to recommended goals of 1000 calories and 70+ grams of protein daily.   Eating out handout was given. Food ideas were discussed including pre-packaged meals and Na+ goals below 2,000 mg daily.  Behavioral modification strategies: increasing lean protein intake.  Brenda [name] has agreed to follow-up with our clinic in 2 to 3 weeks. She was informed of the importance of frequent follow-up visits to maximize her success with intensive lifestyle modifications for her multiple health conditions.   Objective:   Blood pressure 101/68, pulse 62, temperature (!) 97.4 F (36.3 C), height '4\' 11"'$  (1.499 m), weight 144 lb (65.3 kg), SpO2  98 %. Body mass index is 29.08 kg/m.  General: Cooperative, alert, well developed, in no acute distress. HEENT: Conjunctivae and lids unremarkable. Cardiovascular: Regular rhythm.  Lungs: Normal work of breathing. Neurologic: No focal deficits.   Lab Results  Component Value Date   CREATININE 1.16 (H) 08/03/2021   BUN 17 08/03/2021   NA 147 (H) 08/03/2021   K 4.8 08/03/2021   CL 105 08/03/2021   CO2 23 08/03/2021   Lab Results  Component Value Date   ALT 38 (H) 08/03/2021   AST 36 08/03/2021   ALKPHOS 112 08/03/2021   BILITOT 0.5 08/03/2021   Lab Results  Component Value Date   HGBA1C 6.1 (H) 08/03/2021   Lab Results  Component Value Date   INSULIN 4.9 08/03/2021   No results found for: TSH Lab Results  Component Value Date   CHOL 145 08/03/2021   HDL 73 08/03/2021   LDLCALC 55 08/03/2021   TRIG 91 08/03/2021   Lab Results  Component Value Date   VD25OH 90.7 08/03/2021   Lab Results  Component Value Date   WBC 7.7 08/12/2017   HGB 15.3 (H) 02/28/2021   HCT 45.0 02/28/2021   MCV 93.1 08/12/2017   PLT 327.0 08/12/2017   No results found for: IRON, TIBC, FERRITIN  Attestation Statements:   Reviewed by clinician on day of visit: allergies, medications, problem list, medical history, surgical history, family history, social history, and previous encounter notes.  Time spent on visit including pre-visit chart review and post-visit care and charting  was 40 minutes.    I, Trixie Dredge, am acting as transcriptionist for Dennard Nip, MD.  I have reviewed the above documentation for accuracy and completeness, and I agree with the above. -  Dennard Nip, MD

## 2021-09-21 DIAGNOSIS — H0014 Chalazion left upper eyelid: Secondary | ICD-10-CM | POA: Diagnosis not present

## 2021-10-04 ENCOUNTER — Encounter (INDEPENDENT_AMBULATORY_CARE_PROVIDER_SITE_OTHER): Payer: Self-pay | Admitting: Nurse Practitioner

## 2021-10-04 ENCOUNTER — Ambulatory Visit (INDEPENDENT_AMBULATORY_CARE_PROVIDER_SITE_OTHER): Payer: PPO | Admitting: Nurse Practitioner

## 2021-10-04 VITALS — BP 115/71 | HR 56 | Temp 97.4°F | Ht 59.0 in | Wt 144.0 lb

## 2021-10-04 DIAGNOSIS — R7303 Prediabetes: Secondary | ICD-10-CM

## 2021-10-04 DIAGNOSIS — Z6829 Body mass index (BMI) 29.0-29.9, adult: Secondary | ICD-10-CM

## 2021-10-04 DIAGNOSIS — E669 Obesity, unspecified: Secondary | ICD-10-CM | POA: Diagnosis not present

## 2021-10-05 NOTE — Progress Notes (Unsigned)
Chief Complaint:   OBESITY Brenda [name] is here to discuss her progress with her obesity treatment plan along with follow-up of her obesity related diagnoses. Amil is on keeping a food journal and adhering to recommended goals of 1000 calories and 75 grams of protein and states she is following her eating plan approximately 80% of the time. Neeya states she is walking 10,000 steps daily  2-3 times per week.  Today's visit was #: 4 Starting weight: 147 lbs Starting date: 08/03/2021 Today's weight: 144 lbs Today's date: 10/04/2021 Total lbs lost to date: 3 lbs Total lbs lost since last in-office visit: 0  Interim History: Brenda [name] is struggling with eating and  "getting in" 1000 calories per day.She is struggling with her portion goals. Her husband says to her that she needs to lose weight. She eats out a lot because they don't cook at home. She doesn't want to cook. She reports some hunger and cravings. She is drinking water, diet soda, and half and half tea.   Subjective:   1. Pre-diabetes Tasmine's last A1C was 6.1. She reports some hunger and cravings.   Assessment/Plan:   1. Pre-diabetes Handouts: Insulin resistance and diabetes mellitus Type 2 was provided today. We discussed Metformin and she will consider in the future. Placida will continue to work on weight loss, exercise, and decreasing simple carbohydrates to help decrease the risk of diabetes.   2. Obesity, Current BMI 29.2 Brenda [name] is currently in the action stage of change. As such, her goal is to continue with weight loss efforts. She has agreed to keeping a food journal and adhering to recommended goals of 1000 calories and 75 plus grams of protein.   Information was given on Clean Eating and meal prepping. We really discussed the importance of meeting protein and calorie goals.   Exercise goals:  Graycee will add resistance training.   Behavioral modification strategies: increasing lean protein intake, increasing water  intake, dealing with family or coworker sabotage, and planning for success.  Nahia has agreed to follow-up with our clinic in 2 weeks. She was informed of the importance of frequent follow-up visits to maximize her success with intensive lifestyle modifications for her multiple health conditions.   Objective:   Blood pressure 115/71, pulse (!) 56, temperature (!) 97.4 F (36.3 C), height '4\' 11"'$  (1.499 m), weight 144 lb (65.3 kg), SpO2 99 %. Body mass index is 29.08 kg/m.  General: Cooperative, alert, well developed, in no acute distress. HEENT: Conjunctivae and lids unremarkable. Cardiovascular: Regular rhythm.  Lungs: Normal work of breathing. Neurologic: No focal deficits.   Lab Results  Component Value Date   CREATININE 1.16 (H) 08/03/2021   BUN 17 08/03/2021   NA 147 (H) 08/03/2021   K 4.8 08/03/2021   CL 105 08/03/2021   CO2 23 08/03/2021   Lab Results  Component Value Date   ALT 38 (H) 08/03/2021   AST 36 08/03/2021   ALKPHOS 112 08/03/2021   BILITOT 0.5 08/03/2021   Lab Results  Component Value Date   HGBA1C 6.1 (H) 08/03/2021   Lab Results  Component Value Date   INSULIN 4.9 08/03/2021   No results found for: "TSH" Lab Results  Component Value Date   CHOL 145 08/03/2021   HDL 73 08/03/2021   LDLCALC 55 08/03/2021   TRIG 91 08/03/2021   Lab Results  Component Value Date   VD25OH 90.7 08/03/2021   Lab Results  Component Value Date   WBC 7.7 08/12/2017  HGB 15.3 (H) 02/28/2021   HCT 45.0 02/28/2021   MCV 93.1 08/12/2017   PLT 327.0 08/12/2017   No results found for: "IRON", "TIBC", "FERRITIN"  Attestation Statements:   Reviewed by clinician on day of visit: allergies, medications, problem list, medical history, surgical history, family history, social history, and previous encounter notes.  I spent 30 minutes with the patient including reviewing the chart before, during and after seeing the patient.    I, Lizbeth Bark, RMA, am acting as  Location manager for Everardo Pacific, FNP.  I have reviewed the above documentation for accuracy and completeness, and I agree with the above. Everardo Pacific, FNP

## 2021-10-13 DIAGNOSIS — N3010 Interstitial cystitis (chronic) without hematuria: Secondary | ICD-10-CM | POA: Diagnosis not present

## 2021-10-16 ENCOUNTER — Encounter (INDEPENDENT_AMBULATORY_CARE_PROVIDER_SITE_OTHER): Payer: Self-pay | Admitting: Bariatrics

## 2021-10-16 ENCOUNTER — Ambulatory Visit (INDEPENDENT_AMBULATORY_CARE_PROVIDER_SITE_OTHER): Payer: PPO | Admitting: Bariatrics

## 2021-10-16 VITALS — BP 130/81 | HR 68 | Temp 97.5°F | Ht 59.0 in | Wt 146.0 lb

## 2021-10-16 DIAGNOSIS — R7303 Prediabetes: Secondary | ICD-10-CM

## 2021-10-16 DIAGNOSIS — E669 Obesity, unspecified: Secondary | ICD-10-CM

## 2021-10-16 DIAGNOSIS — E782 Mixed hyperlipidemia: Secondary | ICD-10-CM

## 2021-10-16 DIAGNOSIS — Z6829 Body mass index (BMI) 29.0-29.9, adult: Secondary | ICD-10-CM

## 2021-10-16 DIAGNOSIS — M47812 Spondylosis without myelopathy or radiculopathy, cervical region: Secondary | ICD-10-CM | POA: Diagnosis not present

## 2021-10-17 ENCOUNTER — Ambulatory Visit (INDEPENDENT_AMBULATORY_CARE_PROVIDER_SITE_OTHER): Payer: Self-pay | Admitting: Plastic Surgery

## 2021-10-17 ENCOUNTER — Encounter: Payer: Self-pay | Admitting: Plastic Surgery

## 2021-10-17 DIAGNOSIS — Z719 Counseling, unspecified: Secondary | ICD-10-CM

## 2021-10-17 NOTE — Progress Notes (Signed)
[place]  Preoperative Dx: pigmentation of face  Postoperative Dx:  same  Procedure: laser to face   Anesthesia: none  Description of Procedure:  Risks and complications were explained to the patient. Consent was confirmed and signed. Eye protection was placed. Time out was called and all information was confirmed to be correct. The area  area was prepped with alcohol and wiped dry. The BBL laser was set at 590 and 515 nm at 7 J/cm2. The face was lasered. The patient tolerated the procedure well and there were no complications. The patient is to follow up in 4 weeks.

## 2021-10-17 NOTE — Progress Notes (Unsigned)
Chief Complaint:   OBESITY Brenda [name] is here to discuss her progress with her obesity treatment plan along with follow-up of her obesity related diagnoses. Brenda [name] is on keeping a food journal and adhering to recommended goals of 1000 calories and 75 grams of protein daily and states she is following her eating plan approximately 60% of the time. Brenda [name] states she is walking 8-9 hours 2 times per week.    Today's visit was #: 5 Starting weight: 147 lbs Starting date: 08/03/2021 Today's weight: 146 lbs Today's date: 10/16/2021 Total lbs lost to date: 1 Total lbs lost since last in-office visit: 0  Interim History: Brenda [name] is up 2 pounds since her last visit.  She is up a little over a pound in water weight per the bioimpedance scale.  Subjective:   1. Prediabetes Brenda [name] is not on medications currently.  2. Mixed hyperlipidemia Brenda [name] is taking Repatha and Zetia.  Assessment/Plan:   1. Prediabetes Brenda [name] will continue to minimize her carbohydrates (sweets and starches).  Metformin handout was given.  2. Mixed hyperlipidemia Brenda [name] will continue her medications as directed.  3. Obesity, Current BMI 29.6 Brenda [name] is currently in the action stage of change. As such, her goal is to continue with weight loss efforts. She has agreed to keeping a food journal and adhering to recommended goals of 1000 calories and 75 grams of protein daily.   Brenda [name] will follow the plan 80-90%.  Mindful eating was discussed.  She will increase protein.  Exercise goals: Has a total gym at home.  Behavioral modification strategies: increasing lean protein intake, decreasing simple carbohydrates, increasing vegetables, increasing water intake, decreasing eating out, no skipping meals, meal planning and cooking strategies, keeping healthy foods in the home, and planning for success.  Brenda [name] has agreed to follow-up with our clinic in 2 to 3 weeks. She was informed of the importance of frequent follow-up visits  to maximize her success with intensive lifestyle modifications for her multiple health conditions.   Objective:   Blood pressure 130/81, pulse 68, temperature (!) 97.5 F (36.4 C), height '4\' 11"'$  (1.499 m), weight 146 lb (66.2 kg), SpO2 99 %. Body mass index is 29.49 kg/m.  General: Cooperative, alert, well developed, in no acute distress. HEENT: Conjunctivae and lids unremarkable. Cardiovascular: Regular rhythm.  Lungs: Normal work of breathing. Neurologic: No focal deficits.   Lab Results  Component Value Date   CREATININE 1.16 (H) 08/03/2021   BUN 17 08/03/2021   NA 147 (H) 08/03/2021   K 4.8 08/03/2021   CL 105 08/03/2021   CO2 23 08/03/2021   Lab Results  Component Value Date   ALT 38 (H) 08/03/2021   AST 36 08/03/2021   ALKPHOS 112 08/03/2021   BILITOT 0.5 08/03/2021   Lab Results  Component Value Date   HGBA1C 6.1 (H) 08/03/2021   Lab Results  Component Value Date   INSULIN 4.9 08/03/2021   No results found for: "TSH" Lab Results  Component Value Date   CHOL 145 08/03/2021   HDL 73 08/03/2021   LDLCALC 55 08/03/2021   TRIG 91 08/03/2021   Lab Results  Component Value Date   VD25OH 90.7 08/03/2021   Lab Results  Component Value Date   WBC 7.7 08/12/2017   HGB 15.3 (H) 02/28/2021   HCT 45.0 02/28/2021   MCV 93.1 08/12/2017   PLT 327.0 08/12/2017   No results found for: "IRON", "TIBC", "FERRITIN"  Attestation Statements:   Reviewed by clinician on day of  visit: allergies, medications, problem list, medical history, surgical history, family history, social history, and previous encounter notes.   Wilhemena Durie, am acting as Location manager for [employer], DO.  I have reviewed the above documentation for accuracy and completeness, and I agree with the above. -  ***

## 2021-10-21 ENCOUNTER — Encounter (INDEPENDENT_AMBULATORY_CARE_PROVIDER_SITE_OTHER): Payer: Self-pay | Admitting: Bariatrics

## 2021-10-23 ENCOUNTER — Other Ambulatory Visit: Payer: Self-pay | Admitting: Internal Medicine

## 2021-10-23 DIAGNOSIS — Z1231 Encounter for screening mammogram for malignant neoplasm of breast: Secondary | ICD-10-CM

## 2021-11-10 DIAGNOSIS — N3010 Interstitial cystitis (chronic) without hematuria: Secondary | ICD-10-CM | POA: Diagnosis not present

## 2021-11-13 ENCOUNTER — Encounter (INDEPENDENT_AMBULATORY_CARE_PROVIDER_SITE_OTHER): Payer: Self-pay | Admitting: Bariatrics

## 2021-11-13 ENCOUNTER — Ambulatory Visit (INDEPENDENT_AMBULATORY_CARE_PROVIDER_SITE_OTHER): Payer: PPO | Admitting: Bariatrics

## 2021-11-13 VITALS — BP 120/78 | HR 65 | Temp 97.6°F | Ht 59.0 in | Wt 148.0 lb

## 2021-11-13 DIAGNOSIS — E669 Obesity, unspecified: Secondary | ICD-10-CM | POA: Diagnosis not present

## 2021-11-13 DIAGNOSIS — Z6830 Body mass index (BMI) 30.0-30.9, adult: Secondary | ICD-10-CM

## 2021-11-13 DIAGNOSIS — R7303 Prediabetes: Secondary | ICD-10-CM

## 2021-11-13 DIAGNOSIS — I10 Essential (primary) hypertension: Secondary | ICD-10-CM | POA: Diagnosis not present

## 2021-11-14 NOTE — Progress Notes (Signed)
Chief Complaint:   OBESITY Brenda [name] is here to discuss her progress with her obesity treatment plan along with follow-up of her obesity related diagnoses. Brenda [name] is on keeping a food journal and adhering to recommended goals of 1000 calories and 75 grams of protein daily and states she is following her eating plan approximately 70% of the time. Brenda [name] states she is walking and doing crunches for 5-10 minutes 3 times per week.  Today's visit was #: 6 Starting weight: 147 lbs Starting date: 08/03/2021 Today's weight: 148 lbs Today's date: 11/13/2021 Total lbs lost to date: 0 Total lbs lost since last in-office visit: 0  Interim History: Brenda [name] is up 2 pounds since her last visit.  She is struggling to plan.  She is up 2 pounds in water per our bioimpedance scale.  Subjective:   1. Essential hypertension Brenda [name] is taking her medications as directed, and her blood pressure is controlled.  2. Prediabetes Brenda [name] is not on medications currently.  Assessment/Plan:   1. Essential hypertension Brenda [name] will continue her medications, diet, and exercise.  She will eliminate added salt.  2. Prediabetes Brenda [name] will continue to follow her meal plan and will continue with exercise.  3. Obesity, Current BMI 30.0 Brenda [name] is currently in the action stage of change. As such, her goal is to continue with weight loss efforts. She has agreed to keeping a food journal and adhering to recommended goals of 1000 calories and 75 grams of protein daily.   She will keep her protein high.  We will track her calories.  She will have protein at every meal.  Keep water intake high, and do not count calories from exercise.  We will recheck fasting labs at her next visit.  Exercise goals: As is.   Behavioral modification strategies: increasing lean protein intake, decreasing simple carbohydrates, increasing vegetables, increasing water intake, decreasing eating out, no skipping meals, meal planning and cooking  strategies, keeping healthy foods in the home, and planning for success.  Brenda [name] has agreed to follow-up with our clinic in 3 weeks. She was informed of the importance of frequent follow-up visits to maximize her success with intensive lifestyle modifications for her multiple health conditions.   Objective:   Blood pressure 120/78, pulse 65, temperature 97.6 F (36.4 C), height '4\' 11"'$  (1.499 m), weight 148 lb (67.1 kg), SpO2 98 %. Body mass index is 29.89 kg/m.  General: Cooperative, alert, well developed, in no acute distress. HEENT: Conjunctivae and lids unremarkable. Cardiovascular: Regular rhythm.  Lungs: Normal work of breathing. Neurologic: No focal deficits.   Lab Results  Component Value Date   CREATININE 1.16 (H) 08/03/2021   BUN 17 08/03/2021   NA 147 (H) 08/03/2021   K 4.8 08/03/2021   CL 105 08/03/2021   CO2 23 08/03/2021   Lab Results  Component Value Date   ALT 38 (H) 08/03/2021   AST 36 08/03/2021   ALKPHOS 112 08/03/2021   BILITOT 0.5 08/03/2021   Lab Results  Component Value Date   HGBA1C 6.1 (H) 08/03/2021   Lab Results  Component Value Date   INSULIN 4.9 08/03/2021   No results found for: "TSH" Lab Results  Component Value Date   CHOL 145 08/03/2021   HDL 73 08/03/2021   LDLCALC 55 08/03/2021   TRIG 91 08/03/2021   Lab Results  Component Value Date   VD25OH 90.7 08/03/2021   Lab Results  Component Value Date   WBC 7.7 08/12/2017   HGB 15.3 (H)  02/28/2021   HCT 45.0 02/28/2021   MCV 93.1 08/12/2017   PLT 327.0 08/12/2017   No results found for: "IRON", "TIBC", "FERRITIN"  Attestation Statements:   Reviewed by clinician on day of visit: allergies, medications, problem list, medical history, surgical history, family history, social history, and previous encounter notes.   Wilhemena Durie, am acting as Location manager for [employer], DO.  I have reviewed the above documentation for accuracy and completeness, and I agree with the  above. Jearld Lesch, DO

## 2021-11-15 ENCOUNTER — Encounter (INDEPENDENT_AMBULATORY_CARE_PROVIDER_SITE_OTHER): Payer: Self-pay | Admitting: Bariatrics

## 2021-11-27 DIAGNOSIS — M47812 Spondylosis without myelopathy or radiculopathy, cervical region: Secondary | ICD-10-CM | POA: Diagnosis not present

## 2021-11-27 DIAGNOSIS — G5601 Carpal tunnel syndrome, right upper limb: Secondary | ICD-10-CM | POA: Diagnosis not present

## 2021-12-05 ENCOUNTER — Encounter: Payer: Self-pay | Admitting: Psychiatry

## 2021-12-05 ENCOUNTER — Other Ambulatory Visit: Payer: Self-pay | Admitting: Internal Medicine

## 2021-12-05 ENCOUNTER — Ambulatory Visit
Admission: RE | Admit: 2021-12-05 | Discharge: 2021-12-05 | Disposition: A | Discharge status: Home / Self Care | Payer: PPO | Source: Ambulatory Visit | Attending: Internal Medicine | Admitting: Internal Medicine

## 2021-12-05 ENCOUNTER — Ambulatory Visit (INDEPENDENT_AMBULATORY_CARE_PROVIDER_SITE_OTHER): Payer: PPO | Admitting: Psychiatry

## 2021-12-05 DIAGNOSIS — F330 Major depressive disorder, recurrent, mild: Secondary | ICD-10-CM

## 2021-12-05 DIAGNOSIS — F4001 Agoraphobia with panic disorder: Secondary | ICD-10-CM

## 2021-12-05 DIAGNOSIS — F5105 Insomnia due to other mental disorder: Secondary | ICD-10-CM | POA: Diagnosis not present

## 2021-12-05 DIAGNOSIS — F411 Generalized anxiety disorder: Secondary | ICD-10-CM | POA: Diagnosis not present

## 2021-12-05 DIAGNOSIS — Z1231 Encounter for screening mammogram for malignant neoplasm of breast: Secondary | ICD-10-CM

## 2021-12-05 DIAGNOSIS — N644 Mastodynia: Secondary | ICD-10-CM

## 2021-12-05 MED ORDER — DEXTROMETHORPHAN HBR 15 MG/15ML PO LIQD: 7.5000 mL | Freq: Two times a day (BID) | ORAL | 0 refills | Status: DC

## 2021-12-05 NOTE — Patient Instructions (Signed)
[facility] option
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
## 2021-12-06 ENCOUNTER — Encounter (INDEPENDENT_AMBULATORY_CARE_PROVIDER_SITE_OTHER): Payer: Self-pay

## 2021-12-08 DIAGNOSIS — N3010 Interstitial cystitis (chronic) without hematuria: Secondary | ICD-10-CM | POA: Diagnosis not present

## 2021-12-12 ENCOUNTER — Ambulatory Visit
Admission: RE | Admit: 2021-12-12 | Discharge: 2021-12-12 | Disposition: A | Discharge status: Home / Self Care | Payer: PPO | Source: Ambulatory Visit | Attending: Internal Medicine | Admitting: Internal Medicine

## 2021-12-12 DIAGNOSIS — N644 Mastodynia: Secondary | ICD-10-CM

## 2021-12-13 ENCOUNTER — Ambulatory Visit: Payer: PPO | Admitting: Psychiatry

## 2021-12-19 ENCOUNTER — Ambulatory Visit (INDEPENDENT_AMBULATORY_CARE_PROVIDER_SITE_OTHER): Payer: PPO | Admitting: Nurse Practitioner

## 2021-12-19 ENCOUNTER — Encounter (INDEPENDENT_AMBULATORY_CARE_PROVIDER_SITE_OTHER): Payer: Self-pay | Admitting: Nurse Practitioner

## 2021-12-19 VITALS — BP 124/79 | HR 62 | Temp 97.5°F | Ht 59.0 in | Wt 147.0 lb

## 2021-12-19 DIAGNOSIS — E669 Obesity, unspecified: Secondary | ICD-10-CM | POA: Diagnosis not present

## 2021-12-19 DIAGNOSIS — E559 Vitamin D deficiency, unspecified: Secondary | ICD-10-CM

## 2021-12-19 DIAGNOSIS — Z6829 Body mass index (BMI) 29.0-29.9, adult: Secondary | ICD-10-CM | POA: Diagnosis not present

## 2021-12-19 DIAGNOSIS — R7303 Prediabetes: Secondary | ICD-10-CM | POA: Diagnosis not present

## 2021-12-19 DIAGNOSIS — R5383 Other fatigue: Secondary | ICD-10-CM | POA: Diagnosis not present

## 2021-12-20 LAB — COMPREHENSIVE METABOLIC PANEL
ALT: 23 IU/L (ref 0–32)
AST: 22 IU/L (ref 0–40)
Albumin/Globulin Ratio: 1.8 (ref 1.2–2.2)
Albumin: 4.2 g/dL (ref 3.9–4.9)
Alkaline Phosphatase: 106 IU/L (ref 44–121)
BUN/Creatinine Ratio: 17 (ref 12–28)
BUN: 19 mg/dL (ref 8–27)
Bilirubin Total: 0.4 mg/dL (ref 0.0–1.2)
CO2: 24 mmol/L (ref 20–29)
Calcium: 9.9 mg/dL (ref 8.7–10.3)
Chloride: 104 mmol/L (ref 96–106)
Creatinine, Ser: 1.11 mg/dL — ABNORMAL HIGH (ref 0.57–1.00)
Globulin, Total: 2.3 g/dL (ref 1.5–4.5)
Glucose: 100 mg/dL — ABNORMAL HIGH (ref 70–99)
Potassium: 4.1 mmol/L (ref 3.5–5.2)
Sodium: 141 mmol/L (ref 134–144)
Total Protein: 6.5 g/dL (ref 6.0–8.5)
eGFR: 53 mL/min/{1.73_m2} — ABNORMAL LOW (ref >59)

## 2021-12-20 LAB — VITAMIN B12: Vitamin B-12: 516 pg/mL (ref 232–1245)

## 2021-12-20 LAB — T4, FREE: Free T4: 1.17 ng/dL (ref 0.82–1.77)

## 2021-12-20 LAB — T3: T3, Total: 107 ng/dL (ref 71–180)

## 2021-12-20 LAB — VITAMIN D 25 HYDROXY (VIT D DEFICIENCY, FRACTURES): Vit D, 25-Hydroxy: 56.8 ng/mL (ref 30.0–100.0)

## 2021-12-20 LAB — TSH: TSH: 2.17 u[IU]/mL (ref 0.450–4.500)

## 2021-12-20 LAB — INSULIN, RANDOM: INSULIN: 10.4 u[IU]/mL (ref 2.6–24.9)

## 2021-12-20 LAB — HEMOGLOBIN A1C
Est. average glucose Bld gHb Est-mCnc: 128 mg/dL
Hgb A1c MFr Bld: 6.1 % — ABNORMAL HIGH (ref 4.8–5.6)

## 2021-12-27 NOTE — Progress Notes (Signed)
Chief Complaint:   OBESITY Brenda [name] is here to discuss her progress with her obesity treatment plan along with follow-up of her obesity related diagnoses. Brenda [name] is on keeping a food journal and adhering to recommended goals of 1000 calories and 75 grams of protein and states she is following her eating plan approximately 80% of the time. Brenda [name] states she is using the [employer] 10 minutes 2 times per week.  Today's visit was #: 7 Starting weight: 147 lbs Starting date: 08/03/2021 Today's weight: 147 lbs Today's date: 12/19/2021 Total lbs lost to date: 0 lbs Total lbs lost since last in-office visit: 1  Interim History: Brenda [name] is struggling with weight loss. Feels having problems with bloating. She saw GI last on 07/24/21. Last colonoscopy was on 04/10/16. She eats out a lot but tries to "watch what I get". Substitutes a protein shake for breakfast or drinks one as a snack. Calories: [phone], protein:75+. Averages 8,000-9,000 steps 2 days per week. Feels like "I'm not going anywhere". Drinking water and tea and Coke Zero.  Subjective:   1. Prediabetes Brenda [name] has never been on medication. Her last A1c was 6.1. Family history: mother, sister with DMT2.  2. Fatigue, unspecified type Kalen notes increased fatigue. Always stays tired.  3. Vitamin D deficiency Brenda [name] is taking Vit D 2,000 IU daily.  Assessment/Plan:   1. Prediabetes Brenda [name] will continue to work on weight loss, exercise, and decreasing simple carbohydrates to help decrease the risk of diabetes.  We will obtain labs today.  - Comprehensive metabolic panel - Hemoglobin A1c - Insulin, random  2. Fatigue, unspecified type We will obtain labs today.  To follow up with PCP for fatigue and bloating.    - Comprehensive metabolic panel - TSH - T4, free - T3 - Vitamin B12  3. Vitamin D deficiency We will obtain labs today.  - VITAMIN D 25 Hydroxy (Vit-D Deficiency, Fractures)  4. Obesity, Current BMI  29.9 Brenda [name] is currently in the action stage of change. As such, her goal is to continue with weight loss efforts. She has agreed to keeping a food journal and adhering to recommended goals of 1000 calories and 75 grams of protein.    We will obtain IC at next visit. We will obtain labs today.  Exercise goals: As is.  Behavioral modification strategies: increasing lean protein intake, increasing water intake, and planning for success.  Brenda [name] has agreed to follow-up with our clinic in 4 weeks. She was informed of the importance of frequent follow-up visits to maximize her success with intensive lifestyle modifications for her multiple health conditions.   Brenda [name] was informed we would discuss her lab results at her next visit unless there is a critical issue that needs to be addressed sooner. Brenda [name] agreed to keep her next visit at the agreed upon time to discuss these results.  Objective:   Blood pressure 124/79, pulse 62, temperature (!) 97.5 F (36.4 C), height '4\' 11"'$  (1.499 m), weight 147 lb (66.7 kg), SpO2 99 %. Body mass index is 29.69 kg/m.  General: Cooperative, alert, well developed, in no acute distress. HEENT: Conjunctivae and lids unremarkable. Cardiovascular: Regular rhythm.  Lungs: Normal work of breathing. Neurologic: No focal deficits.   Lab Results  Component Value Date   CREATININE 1.11 (H) 12/19/2021   BUN 19 12/19/2021   NA 141 12/19/2021   K 4.1 12/19/2021   CL 104 12/19/2021   CO2 24 12/19/2021   Lab Results  Component Value Date  ALT 23 12/19/2021   AST 22 12/19/2021   ALKPHOS 106 12/19/2021   BILITOT 0.4 12/19/2021   Lab Results  Component Value Date   HGBA1C 6.1 (H) 12/19/2021   HGBA1C 6.1 (H) 08/03/2021   Lab Results  Component Value Date   INSULIN 10.4 12/19/2021   INSULIN 4.9 08/03/2021   Lab Results  Component Value Date   TSH 2.170 12/19/2021   Lab Results  Component Value Date   CHOL 145 08/03/2021   HDL 73 08/03/2021    LDLCALC 55 08/03/2021   TRIG 91 08/03/2021   Lab Results  Component Value Date   VD25OH 56.8 12/19/2021   VD25OH 90.7 08/03/2021   Lab Results  Component Value Date   WBC 7.7 08/12/2017   HGB 15.3 (H) 02/28/2021   HCT 45.0 02/28/2021   MCV 93.1 08/12/2017   PLT 327.0 08/12/2017   No results found for: "IRON", "TIBC", "FERRITIN"  Attestation Statements:   Reviewed by clinician on day of visit: allergies, medications, problem list, medical history, surgical history, family history, social history, and previous encounter notes.  I, Brendell Tyus, RMA, am acting as transcriptionist for Everardo Pacific, FNP.  I have reviewed the above documentation for accuracy and completeness, and I agree with the above. Everardo Pacific, FNP

## 2022-01-05 DIAGNOSIS — N3010 Interstitial cystitis (chronic) without hematuria: Secondary | ICD-10-CM | POA: Diagnosis not present

## 2022-01-22 ENCOUNTER — Ambulatory Visit (INDEPENDENT_AMBULATORY_CARE_PROVIDER_SITE_OTHER): Payer: PPO | Admitting: Family Medicine

## 2022-01-22 ENCOUNTER — Encounter (INDEPENDENT_AMBULATORY_CARE_PROVIDER_SITE_OTHER): Payer: Self-pay | Admitting: Family Medicine

## 2022-01-22 VITALS — BP 125/74 | HR 64 | Temp 97.7°F | Ht 59.0 in | Wt 146.0 lb

## 2022-01-22 DIAGNOSIS — Z6829 Body mass index (BMI) 29.0-29.9, adult: Secondary | ICD-10-CM | POA: Diagnosis not present

## 2022-01-22 DIAGNOSIS — R0602 Shortness of breath: Secondary | ICD-10-CM | POA: Diagnosis not present

## 2022-01-22 DIAGNOSIS — Z6830 Body mass index (BMI) 30.0-30.9, adult: Secondary | ICD-10-CM

## 2022-01-22 DIAGNOSIS — E669 Obesity, unspecified: Secondary | ICD-10-CM | POA: Diagnosis not present

## 2022-01-22 DIAGNOSIS — R7303 Prediabetes: Secondary | ICD-10-CM

## 2022-01-24 NOTE — Progress Notes (Unsigned)
     Chief Complaint:   OBESITY Brenda [name] is here to discuss her progress with her obesity treatment plan along with follow-up of her obesity related diagnoses. Brenda [name] is on {MWMwtlossportion/plan2:23431} and states she is following her eating plan approximately ***% of the time. Brenda [name] states she is *** *** minutes *** times per week.  Today's visit was #: *** Starting weight: *** Starting date: *** Today's weight: *** Today's date: 01/22/2022 Total lbs lost to date: *** Total lbs lost since last in-office visit: ***  Interim History: ***  Subjective:   1. Prediabetes ***  2. SOBOE (shortness of breath on exertion) ***  Assessment/Plan:   1. Prediabetes ***  2. SOBOE (shortness of breath on exertion) ***  3. Obesity, Current BMI 29.5 Brenda [name] {CHL AMB IS/IS NOT:210130109} currently in the action stage of change. As such, her goal is to {MWMwtloss#1:210800005}. She has agreed to {MWMwtlossportion/plan2:23431}.   Exercise goals: {MWM EXERCISE RECS:23473}  Behavioral modification strategies: {MWMwtlossdietstrategies3:23432}.  Brenda [name] has agreed to follow-up with our clinic in {NUMBER 1-10:22536} weeks. She was informed of the importance of frequent follow-up visits to maximize her success with intensive lifestyle modifications for her multiple health conditions.   Objective:   Blood pressure 125/74, pulse 64, temperature 97.7 F (36.5 C), height '4\' 11"'$  (1.499 m), weight 146 lb (66.2 kg), SpO2 100 %. Body mass index is 29.49 kg/m.  General: Cooperative, alert, well developed, in no acute distress. HEENT: Conjunctivae and lids unremarkable. Cardiovascular: Regular rhythm.  Lungs: Normal work of breathing. Neurologic: No focal deficits.   Lab Results  Component Value Date   CREATININE 1.11 (H) 12/19/2021   BUN 19 12/19/2021   NA 141 12/19/2021   K 4.1 12/19/2021   CL 104 12/19/2021   CO2 24 12/19/2021   Lab Results  Component Value Date   ALT 23 12/19/2021    AST 22 12/19/2021   ALKPHOS 106 12/19/2021   BILITOT 0.4 12/19/2021   Lab Results  Component Value Date   HGBA1C 6.1 (H) 12/19/2021   HGBA1C 6.1 (H) 08/03/2021   Lab Results  Component Value Date   INSULIN 10.4 12/19/2021   INSULIN 4.9 08/03/2021   Lab Results  Component Value Date   TSH 2.170 12/19/2021   Lab Results  Component Value Date   CHOL 145 08/03/2021   HDL 73 08/03/2021   LDLCALC 55 08/03/2021   TRIG 91 08/03/2021   Lab Results  Component Value Date   VD25OH 56.8 12/19/2021   VD25OH 90.7 08/03/2021   Lab Results  Component Value Date   WBC 7.7 08/12/2017   HGB 15.3 (H) 02/28/2021   HCT 45.0 02/28/2021   MCV 93.1 08/12/2017   PLT 327.0 08/12/2017   No results found for: "IRON", "TIBC", "FERRITIN"  Attestation Statements:   Reviewed by clinician on day of visit: allergies, medications, problem list, medical history, surgical history, family history, social history, and previous encounter notes.   I, Trixie Dredge, am acting as transcriptionist for Dennard Nip, MD.  I have reviewed the above documentation for accuracy and completeness, and I agree with the above. -  ***

## 2022-02-02 DIAGNOSIS — N3010 Interstitial cystitis (chronic) without hematuria: Secondary | ICD-10-CM | POA: Diagnosis not present

## 2022-02-10 DIAGNOSIS — Z23 Encounter for immunization: Secondary | ICD-10-CM | POA: Diagnosis not present

## 2022-02-16 DIAGNOSIS — Z719 Counseling, unspecified: Secondary | ICD-10-CM

## 2022-02-17 MED ORDER — LIDOCAINE 23% - TETRACAINE 7% TOPICAL OINTMENT (PLASTICIZED): 1.0000 | TOPICAL_OINTMENT | Freq: Once | CUTANEOUS | 0 refills | Status: AC

## 2022-02-19 ENCOUNTER — Encounter (INDEPENDENT_AMBULATORY_CARE_PROVIDER_SITE_OTHER): Payer: Self-pay | Admitting: Family Medicine

## 2022-02-19 ENCOUNTER — Ambulatory Visit (INDEPENDENT_AMBULATORY_CARE_PROVIDER_SITE_OTHER): Payer: PPO | Admitting: Family Medicine

## 2022-02-19 VITALS — BP 122/78 | HR 62 | Temp 97.8°F | Ht 59.0 in | Wt 145.0 lb

## 2022-02-19 DIAGNOSIS — I10 Essential (primary) hypertension: Secondary | ICD-10-CM | POA: Diagnosis not present

## 2022-02-19 DIAGNOSIS — E669 Obesity, unspecified: Secondary | ICD-10-CM | POA: Diagnosis not present

## 2022-02-19 DIAGNOSIS — Z6829 Body mass index (BMI) 29.0-29.9, adult: Secondary | ICD-10-CM

## 2022-02-19 DIAGNOSIS — R7303 Prediabetes: Secondary | ICD-10-CM

## 2022-02-19 DIAGNOSIS — Z6830 Body mass index (BMI) 30.0-30.9, adult: Secondary | ICD-10-CM

## 2022-02-21 ENCOUNTER — Other Ambulatory Visit: Payer: Self-pay | Admitting: Surgical

## 2022-02-21 ENCOUNTER — Other Ambulatory Visit (HOSPITAL_BASED_OUTPATIENT_CLINIC_OR_DEPARTMENT_OTHER): Payer: Self-pay

## 2022-02-21 MED ORDER — LIDOCAINE 23% - TETRACAINE 7% TOPICAL OINTMENT (PLASTICIZED)
1.0000 | TOPICAL_OINTMENT | Freq: Once | CUTANEOUS | 0 refills | Status: AC
  Filled 2022-02-21: qty 60, 2d supply, fill #0

## 2022-02-21 NOTE — Progress Notes (Signed)
Rx numbing ointment for Laser with Dr. Marla Roe

## 2022-02-22 ENCOUNTER — Ambulatory Visit (INDEPENDENT_AMBULATORY_CARE_PROVIDER_SITE_OTHER): Payer: Self-pay | Admitting: Plastic Surgery

## 2022-02-22 ENCOUNTER — Encounter: Payer: Self-pay | Admitting: Plastic Surgery

## 2022-02-22 ENCOUNTER — Other Ambulatory Visit: Payer: PPO | Admitting: Plastic Surgery

## 2022-02-22 ENCOUNTER — Other Ambulatory Visit (HOSPITAL_COMMUNITY): Payer: Self-pay

## 2022-02-22 DIAGNOSIS — Z719 Counseling, unspecified: Secondary | ICD-10-CM

## 2022-02-22 MED ORDER — VALACYCLOVIR HCL 500 MG PO TABS: 500.0000 mg | ORAL_TABLET | Freq: Two times a day (BID) | ORAL | 0 refills | Status: AC

## 2022-02-22 NOTE — Progress Notes (Signed)
[facility] Treatment   Treatment  Settings:       In media      Topical and/or Block: Lidocaine 23% and Tetracaine 7%  Post Care: instructions given  Notes: Valtrex sent to pharmacy due to history.

## 2022-02-23 ENCOUNTER — Other Ambulatory Visit: Payer: PPO | Admitting: Plastic Surgery

## 2022-02-24 ENCOUNTER — Other Ambulatory Visit: Payer: Self-pay | Admitting: Psychiatry

## 2022-02-24 DIAGNOSIS — F4001 Agoraphobia with panic disorder: Secondary | ICD-10-CM

## 2022-02-24 DIAGNOSIS — F411 Generalized anxiety disorder: Secondary | ICD-10-CM

## 2022-02-24 DIAGNOSIS — F330 Major depressive disorder, recurrent, mild: Secondary | ICD-10-CM

## 2022-02-25 NOTE — Progress Notes (Unsigned)
Chief Complaint:   OBESITY Cheyanna is here to discuss her progress with her obesity treatment plan along with follow-up of her obesity related diagnoses. Syniah is on keeping a food journal and adhering to recommended goals of 1000 calories and 75 grams of protein daily and states she is following her eating plan approximately 65% of the time. Tajae states she is doing total gym for 10 minutes 1-2 times per week.  Today's visit was #: 9 Starting weight: 147 lbs Starting date: 08/03/2021 Today's weight: 145 lbs Today's date: 02/19/2022 Total lbs lost to date: 2 Total lbs lost since last in-office visit: 1  Interim History: Shakiah is working on journaling but she finds it difficult to be accurate. She feels she does better with meeting her calories, but not her protein. This has likely decreased her RMR and has been hurting her weight loss efforts.   Subjective:   1. Essential hypertension Riannon's blood pressure is well controlled on her medications. She denies chest pain or chronic conditions since youth.   2. Prediabetes Jolynne continues to work on her diet and weight loss. She is due for labs soon. She has a strong family history of diabetes mellitus.   Assessment/Plan:   1. Essential hypertension Jashley will continue with her diet and weight loss. We will recheck labs at her next visit.   2. Prediabetes Kaytie will continue with her diet and weight loss, and we will recheck labs at her next visit.   3. Obesity, Current BMI 29.4 Roman is currently in the action stage of change. As such, her goal is to continue with weight loss efforts. She has agreed to keeping a food journal and adhering to recommended goals of 1000 calories and 75+ grams of protein daily.   Exercise goals: As is.   Behavioral modification strategies: increasing lean protein intake.  Elaisha has agreed to follow-up with our clinic in 4 weeks. She was informed of the importance of frequent follow-up  visits to maximize her success with intensive lifestyle modifications for her multiple health conditions.   Objective:   Blood pressure 122/78, pulse 62, temperature 97.8 F (36.6 C), height '4\' 11"'$  (1.499 m), weight 145 lb (65.8 kg), SpO2 99 %. Body mass index is 29.29 kg/m.  General: Cooperative, alert, well developed, in no acute distress. HEENT: Conjunctivae and lids unremarkable. Cardiovascular: Regular rhythm.  Lungs: Normal work of breathing. Neurologic: No focal deficits.   Lab Results  Component Value Date   CREATININE 1.11 (H) 12/19/2021   BUN 19 12/19/2021   NA 141 12/19/2021   K 4.1 12/19/2021   CL 104 12/19/2021   CO2 24 12/19/2021   Lab Results  Component Value Date   ALT 23 12/19/2021   AST 22 12/19/2021   ALKPHOS 106 12/19/2021   BILITOT 0.4 12/19/2021   Lab Results  Component Value Date   HGBA1C 6.1 (H) 12/19/2021   HGBA1C 6.1 (H) 08/03/2021   Lab Results  Component Value Date   INSULIN 10.4 12/19/2021   INSULIN 4.9 08/03/2021   Lab Results  Component Value Date   TSH 2.170 12/19/2021   Lab Results  Component Value Date   CHOL 145 08/03/2021   HDL 73 08/03/2021   LDLCALC 55 08/03/2021   TRIG 91 08/03/2021   Lab Results  Component Value Date   VD25OH 56.8 12/19/2021   VD25OH 90.7 08/03/2021   Lab Results  Component Value Date   WBC 7.7 08/12/2017   HGB 15.3 (H) 02/28/2021  HCT 45.0 02/28/2021   MCV 93.1 08/12/2017   PLT 327.0 08/12/2017   No results found for: "IRON", "TIBC", "FERRITIN"  Attestation Statements:   Reviewed by clinician on day of visit: allergies, medications, problem list, medical history, surgical history, family history, social history, and previous encounter notes.  Time spent on visit including pre-visit chart review and post-visit care and charting was 35 minutes.   I, Trixie Dredge, am acting as transcriptionist for Dennard Nip, MD.  I have reviewed the above documentation for accuracy and completeness,  and I agree with the above. -  Dennard Nip, MD

## 2022-03-02 DIAGNOSIS — N3010 Interstitial cystitis (chronic) without hematuria: Secondary | ICD-10-CM | POA: Diagnosis not present

## 2022-03-06 ENCOUNTER — Ambulatory Visit: Payer: PPO | Admitting: Psychiatry

## 2022-03-06 DIAGNOSIS — J309 Allergic rhinitis, unspecified: Secondary | ICD-10-CM | POA: Diagnosis not present

## 2022-03-06 DIAGNOSIS — Z1152 Encounter for screening for COVID-19: Secondary | ICD-10-CM | POA: Diagnosis not present

## 2022-03-06 DIAGNOSIS — N1831 Chronic kidney disease, stage 3a: Secondary | ICD-10-CM | POA: Diagnosis not present

## 2022-03-06 DIAGNOSIS — I129 Hypertensive chronic kidney disease with stage 1 through stage 4 chronic kidney disease, or unspecified chronic kidney disease: Secondary | ICD-10-CM | POA: Diagnosis not present

## 2022-03-06 DIAGNOSIS — J029 Acute pharyngitis, unspecified: Secondary | ICD-10-CM | POA: Diagnosis not present

## 2022-03-06 DIAGNOSIS — R0981 Nasal congestion: Secondary | ICD-10-CM | POA: Diagnosis not present

## 2022-03-06 DIAGNOSIS — R051 Acute cough: Secondary | ICD-10-CM | POA: Diagnosis not present

## 2022-03-06 DIAGNOSIS — J069 Acute upper respiratory infection, unspecified: Secondary | ICD-10-CM | POA: Diagnosis not present

## 2022-03-13 DIAGNOSIS — I129 Hypertensive chronic kidney disease with stage 1 through stage 4 chronic kidney disease, or unspecified chronic kidney disease: Secondary | ICD-10-CM | POA: Diagnosis not present

## 2022-03-13 DIAGNOSIS — M8580 Other specified disorders of bone density and structure, unspecified site: Secondary | ICD-10-CM | POA: Diagnosis not present

## 2022-03-13 DIAGNOSIS — J069 Acute upper respiratory infection, unspecified: Secondary | ICD-10-CM | POA: Diagnosis not present

## 2022-03-13 DIAGNOSIS — N3010 Interstitial cystitis (chronic) without hematuria: Secondary | ICD-10-CM | POA: Diagnosis not present

## 2022-03-13 DIAGNOSIS — E785 Hyperlipidemia, unspecified: Secondary | ICD-10-CM | POA: Diagnosis not present

## 2022-03-13 DIAGNOSIS — N1831 Chronic kidney disease, stage 3a: Secondary | ICD-10-CM | POA: Diagnosis not present

## 2022-03-13 DIAGNOSIS — R0789 Other chest pain: Secondary | ICD-10-CM | POA: Diagnosis not present

## 2022-03-13 DIAGNOSIS — M5030 Other cervical disc degeneration, unspecified cervical region: Secondary | ICD-10-CM | POA: Diagnosis not present

## 2022-03-13 DIAGNOSIS — M797 Fibromyalgia: Secondary | ICD-10-CM | POA: Diagnosis not present

## 2022-03-13 DIAGNOSIS — E669 Obesity, unspecified: Secondary | ICD-10-CM | POA: Diagnosis not present

## 2022-03-13 DIAGNOSIS — R7301 Impaired fasting glucose: Secondary | ICD-10-CM | POA: Diagnosis not present

## 2022-03-16 DIAGNOSIS — Z719 Counseling, unspecified: Secondary | ICD-10-CM

## 2022-03-19 ENCOUNTER — Encounter (INDEPENDENT_AMBULATORY_CARE_PROVIDER_SITE_OTHER): Payer: Self-pay | Admitting: Family Medicine

## 2022-03-19 ENCOUNTER — Ambulatory Visit (INDEPENDENT_AMBULATORY_CARE_PROVIDER_SITE_OTHER): Payer: PPO | Admitting: Family Medicine

## 2022-03-20 ENCOUNTER — Encounter: Payer: Self-pay | Admitting: Psychiatry

## 2022-03-20 ENCOUNTER — Ambulatory Visit (INDEPENDENT_AMBULATORY_CARE_PROVIDER_SITE_OTHER): Payer: PPO | Admitting: Psychiatry

## 2022-03-20 DIAGNOSIS — F411 Generalized anxiety disorder: Secondary | ICD-10-CM

## 2022-03-20 DIAGNOSIS — F330 Major depressive disorder, recurrent, mild: Secondary | ICD-10-CM | POA: Diagnosis not present

## 2022-03-20 DIAGNOSIS — F5105 Insomnia due to other mental disorder: Secondary | ICD-10-CM

## 2022-03-20 DIAGNOSIS — M79642 Pain in left hand: Secondary | ICD-10-CM | POA: Diagnosis not present

## 2022-03-20 DIAGNOSIS — F4001 Agoraphobia with panic disorder: Secondary | ICD-10-CM

## 2022-03-20 DIAGNOSIS — M79641 Pain in right hand: Secondary | ICD-10-CM | POA: Diagnosis not present
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
## 2022-03-20 NOTE — Patient Instructions (Signed)
Retry whenever she feels like doing so.  Add 2.5 ml daily for 2 weeks and then increase to 2.5 ml BID for 2 weeks and if no effect then increase to 5 ml twice daily for 2 weeks then if no response then increase to 7.5 ml BID .

## 2022-03-26 DIAGNOSIS — H5213 Myopia, bilateral: Secondary | ICD-10-CM | POA: Diagnosis not present

## 2022-03-26 DIAGNOSIS — Z961 Presence of intraocular lens: Secondary | ICD-10-CM | POA: Diagnosis not present

## 2022-03-26 DIAGNOSIS — H04123 Dry eye syndrome of bilateral lacrimal glands: Secondary | ICD-10-CM | POA: Diagnosis not present

## 2022-03-30 ENCOUNTER — Other Ambulatory Visit: Payer: Self-pay | Admitting: Psychiatry

## 2022-03-30 DIAGNOSIS — F411 Generalized anxiety disorder: Secondary | ICD-10-CM

## 2022-03-30 DIAGNOSIS — N3010 Interstitial cystitis (chronic) without hematuria: Secondary | ICD-10-CM | POA: Diagnosis not present

## 2022-03-30 DIAGNOSIS — F4001 Agoraphobia with panic disorder: Secondary | ICD-10-CM

## 2022-03-30 DIAGNOSIS — F330 Major depressive disorder, recurrent, mild: Secondary | ICD-10-CM

## 2022-04-09 ENCOUNTER — Other Ambulatory Visit: Payer: Self-pay | Admitting: Psychiatry

## 2022-04-09 DIAGNOSIS — F5105 Insomnia due to other mental disorder: Secondary | ICD-10-CM

## 2022-04-10 ENCOUNTER — Ambulatory Visit (INDEPENDENT_AMBULATORY_CARE_PROVIDER_SITE_OTHER): Payer: PPO | Admitting: Family Medicine

## 2022-04-24 ENCOUNTER — Encounter: Payer: Self-pay | Admitting: Plastic Surgery

## 2022-04-24 ENCOUNTER — Ambulatory Visit (INDEPENDENT_AMBULATORY_CARE_PROVIDER_SITE_OTHER): Payer: PPO | Admitting: Plastic Surgery

## 2022-04-24 VITALS — BP 111/78

## 2022-04-24 DIAGNOSIS — N649 Disorder of breast, unspecified: Secondary | ICD-10-CM

## 2022-04-24 DIAGNOSIS — Z719 Counseling, unspecified: Secondary | ICD-10-CM

## 2022-04-24 NOTE — Progress Notes (Signed)
   Subjective:    Patient ID: Brenda [name], female    DOB: 25-Jan-1951, 71 y.o.   MRN: [id number]  The patient is a 71 year old female here for evaluation of her right breast.  She has undergone surgery of her breast several times.  She is concerned about a cyst like lesion right at the 6 o'clock position of the right areola.  Her last mammogram and ultrasound were negative.  But this has come up since the mammogram.  I can feel it it is most likely fat necrosis but I think we should look into it further to rule out any type of breast cancer.      Review of Systems  Constitutional: Negative.   HENT: Negative.    Eyes: Negative.   Respiratory: Negative.    Cardiovascular: Negative.   Gastrointestinal: Negative.   Endocrine: Negative.   Genitourinary: Negative.   Musculoskeletal: Negative.        Objective:   Physical Exam Constitutional:      Appearance: Normal appearance.  HENT:     Head: Normocephalic and atraumatic.  Cardiovascular:     Rate and Rhythm: Normal rate.     Pulses: Normal pulses.  Pulmonary:     Effort: Pulmonary effort is normal.  Chest:    Neurological:     Mental Status: She is alert and oriented to person, place, and time.  Psychiatric:        Mood and Affect: Mood normal.        Behavior: Behavior normal.        Thought Content: Thought content normal.        Judgment: Judgment normal.       Assessment & Plan:     ICD-10-CM   1. Lesion of breast  N64.9        I put in an order for ultrasound of right breast for further evaluation as soon as I get the results I will call the patient.

## 2022-05-01 ENCOUNTER — Encounter (INDEPENDENT_AMBULATORY_CARE_PROVIDER_SITE_OTHER): Payer: Self-pay | Admitting: Family Medicine

## 2022-05-01 ENCOUNTER — Ambulatory Visit (INDEPENDENT_AMBULATORY_CARE_PROVIDER_SITE_OTHER): Payer: PPO | Admitting: Family Medicine

## 2022-05-01 VITALS — BP 124/67 | HR 68 | Temp 97.4°F | Ht 59.0 in | Wt 143.0 lb

## 2022-05-01 DIAGNOSIS — Z6829 Body mass index (BMI) 29.0-29.9, adult: Secondary | ICD-10-CM | POA: Diagnosis not present

## 2022-05-01 DIAGNOSIS — R7303 Prediabetes: Secondary | ICD-10-CM

## 2022-05-01 DIAGNOSIS — E669 Obesity, unspecified: Secondary | ICD-10-CM

## 2022-05-01 DIAGNOSIS — E559 Vitamin D deficiency, unspecified: Secondary | ICD-10-CM | POA: Diagnosis not present

## 2022-05-01 DIAGNOSIS — E782 Mixed hyperlipidemia: Secondary | ICD-10-CM

## 2022-05-02 ENCOUNTER — Other Ambulatory Visit: Payer: Self-pay

## 2022-05-02 ENCOUNTER — Other Ambulatory Visit: Payer: Self-pay | Admitting: Urology

## 2022-05-02 ENCOUNTER — Encounter (HOSPITAL_BASED_OUTPATIENT_CLINIC_OR_DEPARTMENT_OTHER): Payer: Self-pay | Admitting: Urology

## 2022-05-02 DIAGNOSIS — R102 Pelvic and perineal pain: Secondary | ICD-10-CM | POA: Diagnosis not present

## 2022-05-02 DIAGNOSIS — N62 Hypertrophy of breast: Secondary | ICD-10-CM

## 2022-05-02 DIAGNOSIS — N3010 Interstitial cystitis (chronic) without hematuria: Secondary | ICD-10-CM | POA: Diagnosis not present

## 2022-05-02 DIAGNOSIS — N649 Disorder of breast, unspecified: Secondary | ICD-10-CM

## 2022-05-02 LAB — CBC WITH DIFFERENTIAL/PLATELET
Basophils Absolute: 0.1 10*3/uL (ref 0.0–0.2)
Basos: 1 %
EOS (ABSOLUTE): 0.1 10*3/uL (ref 0.0–0.4)
Eos: 1 %
Hematocrit: 41 % (ref 34.0–46.6)
Hemoglobin: 13.6 g/dL (ref 11.1–15.9)
Immature Grans (Abs): 0 10*3/uL (ref 0.0–0.1)
Immature Granulocytes: 0 %
Lymphocytes Absolute: 1.8 10*3/uL (ref 0.7–3.1)
Lymphs: 26 %
MCH: 30.9 pg (ref 26.6–33.0)
MCHC: 33.2 g/dL (ref 31.5–35.7)
MCV: 93 fL (ref 79–97)
Monocytes Absolute: 0.5 10*3/uL (ref 0.1–0.9)
Monocytes: 7 %
Neutrophils Absolute: 4.5 10*3/uL (ref 1.4–7.0)
Neutrophils: 65 %
Platelets: 259 10*3/uL (ref 150–450)
RBC: 4.4 x10E6/uL (ref 3.77–5.28)
RDW: 14.2 % (ref 11.7–15.4)
WBC: 7 10*3/uL (ref 3.4–10.8)

## 2022-05-02 LAB — CMP14+EGFR
ALT: 25 IU/L (ref 0–32)
AST: 22 IU/L (ref 0–40)
Albumin/Globulin Ratio: 2 (ref 1.2–2.2)
Albumin: 4.5 g/dL (ref 3.9–4.9)
Alkaline Phosphatase: 118 IU/L (ref 44–121)
BUN/Creatinine Ratio: 18 (ref 12–28)
BUN: 19 mg/dL (ref 8–27)
Bilirubin Total: 0.4 mg/dL (ref 0.0–1.2)
CO2: 23 mmol/L (ref 20–29)
Calcium: 10.1 mg/dL (ref 8.7–10.3)
Chloride: 104 mmol/L (ref 96–106)
Creatinine, Ser: 1.04 mg/dL — ABNORMAL HIGH (ref 0.57–1.00)
Globulin, Total: 2.3 g/dL (ref 1.5–4.5)
Glucose: 94 mg/dL (ref 70–99)
Potassium: 4.6 mmol/L (ref 3.5–5.2)
Sodium: 142 mmol/L (ref 134–144)
Total Protein: 6.8 g/dL (ref 6.0–8.5)
eGFR: 58 mL/min/{1.73_m2} — ABNORMAL LOW (ref >59)

## 2022-05-02 LAB — LIPID PANEL WITH LDL/HDL RATIO
Cholesterol, Total: 164 mg/dL (ref 100–199)
HDL: 66 mg/dL (ref >39)
LDL Chol Calc (NIH): 73 mg/dL (ref 0–99)
LDL/HDL Ratio: 1.1 ratio (ref 0.0–3.2)
Triglycerides: 147 mg/dL (ref 0–149)
VLDL Cholesterol Cal: 25 mg/dL (ref 5–40)

## 2022-05-02 LAB — INSULIN, RANDOM: INSULIN: 10.7 u[IU]/mL (ref 2.6–24.9)

## 2022-05-02 LAB — VITAMIN D 25 HYDROXY (VIT D DEFICIENCY, FRACTURES): Vit D, 25-Hydroxy: 48.2 ng/mL (ref 30.0–100.0)

## 2022-05-02 LAB — HEMOGLOBIN A1C
Est. average glucose Bld gHb Est-mCnc: 128 mg/dL
Hgb A1c MFr Bld: 6.1 % — ABNORMAL HIGH (ref 4.8–5.6)

## 2022-05-02 NOTE — Progress Notes (Signed)
Spoke w/ via phone for pre-op interview--- pt Lab needs dos---- ekg              Lab results------ current lab work results dated 05-01-2022 in epic CBCdiff/ CMP COVID test -----patient states asymptomatic no test needed Arrive at ------- 1015 on 05-04-2022 NPO after MN NO Solid Food.  Clear liquids from MN until--- 0915 Med rec completed Medications to take morning of surgery ----- toprol, protonix, ativan Diabetic medication ----- n/a Patient instructed no nail polish to be worn day of surgery Patient instructed to bring photo id and insurance card day of surgery Patient aware to have Driver (ride ) / caregiver    for 24 hours after surgery -- husband, grady Patient Special Instructions -----  n/a Pre-Op special Istructions -----  orders need second sign, case just added on today Patient verbalized understanding of instructions that were given at this phone interview. Patient denies shortness of breath, chest pain, fever, cough at this phone interview.

## 2022-05-04 ENCOUNTER — Ambulatory Visit (HOSPITAL_BASED_OUTPATIENT_CLINIC_OR_DEPARTMENT_OTHER): Payer: PPO | Admitting: Certified Registered Nurse Anesthetist

## 2022-05-04 ENCOUNTER — Ambulatory Visit (HOSPITAL_BASED_OUTPATIENT_CLINIC_OR_DEPARTMENT_OTHER)
Admission: RE | Admit: 2022-05-04 | Discharge: 2022-05-04 | Disposition: A | Discharge status: Home / Self Care | Payer: PPO | Attending: Urology | Admitting: Urology

## 2022-05-04 ENCOUNTER — Telehealth: Payer: Self-pay | Admitting: *Deleted

## 2022-05-04 ENCOUNTER — Encounter (HOSPITAL_BASED_OUTPATIENT_CLINIC_OR_DEPARTMENT_OTHER)
Admission: RE | Disposition: A | Discharge status: Home / Self Care | Payer: Self-pay | Source: Home / Self Care | Attending: Urology

## 2022-05-04 ENCOUNTER — Encounter (HOSPITAL_BASED_OUTPATIENT_CLINIC_OR_DEPARTMENT_OTHER): Payer: Self-pay | Admitting: Urology

## 2022-05-04 DIAGNOSIS — Z8673 Personal history of transient ischemic attack (TIA), and cerebral infarction without residual deficits: Secondary | ICD-10-CM | POA: Diagnosis not present

## 2022-05-04 DIAGNOSIS — J45909 Unspecified asthma, uncomplicated: Secondary | ICD-10-CM | POA: Diagnosis not present

## 2022-05-04 DIAGNOSIS — N3010 Interstitial cystitis (chronic) without hematuria: Secondary | ICD-10-CM | POA: Diagnosis not present

## 2022-05-04 DIAGNOSIS — Z01818 Encounter for other preprocedural examination: Secondary | ICD-10-CM

## 2022-05-04 DIAGNOSIS — N3592 Unspecified urethral stricture, female: Secondary | ICD-10-CM | POA: Diagnosis not present

## 2022-05-04 DIAGNOSIS — N202 Calculus of kidney with calculus of ureter: Secondary | ICD-10-CM | POA: Insufficient documentation

## 2022-05-04 DIAGNOSIS — I10 Essential (primary) hypertension: Secondary | ICD-10-CM | POA: Diagnosis not present

## 2022-05-04 DIAGNOSIS — F418 Other specified anxiety disorders: Secondary | ICD-10-CM

## 2022-05-04 HISTORY — DX: Mixed hyperlipidemia: E78.2

## 2022-05-04 HISTORY — PX: CYSTO WITH HYDRODISTENSION: SHX5453

## 2022-05-04 HISTORY — DX: Agoraphobia with panic disorder: F40.01

## 2022-05-04 HISTORY — DX: Other specified symptoms and signs involving the circulatory and respiratory systems: R09.89

## 2022-05-04 HISTORY — DX: Mixed irritable bowel syndrome: K58.2

## 2022-05-04 HISTORY — DX: Presence of spectacles and contact lenses: Z97.3

## 2022-05-04 HISTORY — DX: Generalized anxiety disorder: F41.1

## 2022-05-04 HISTORY — DX: Major depressive disorder, single episode, unspecified: F32.9

## 2022-05-04 SURGERY — CYSTOSCOPY, WITH BLADDER HYDRODISTENSION
Anesthesia: General | Site: Bladder

## 2022-05-04 MED ORDER — SODIUM CHLORIDE 0.9 % IV SOLN: INTRAVENOUS | Status: DC

## 2022-05-04 MED ORDER — ONDANSETRON HCL 4 MG/2ML IJ SOLN
INTRAMUSCULAR | Status: DC | PRN
  Administered 2022-05-04: 4 mg via INTRAVENOUS

## 2022-05-04 MED ORDER — DEXAMETHASONE SODIUM PHOSPHATE 10 MG/ML IJ SOLN
INTRAMUSCULAR | Status: DC | PRN
  Administered 2022-05-04: 5 mg via INTRAVENOUS

## 2022-05-04 MED ORDER — CIPROFLOXACIN IN D5W 400 MG/200ML IV SOLN
INTRAVENOUS | Status: AC
  Filled 2022-05-04: qty 200

## 2022-05-04 MED ORDER — ONDANSETRON HCL 4 MG/2ML IJ SOLN
INTRAMUSCULAR | Status: AC
  Filled 2022-05-04: qty 4

## 2022-05-04 MED ORDER — MEPERIDINE HCL 25 MG/ML IJ SOLN
INTRAMUSCULAR | Status: AC
  Filled 2022-05-04: qty 1

## 2022-05-04 MED ORDER — PROMETHAZINE HCL 25 MG/ML IJ SOLN: 6.2500 mg | INTRAMUSCULAR | Status: DC | PRN

## 2022-05-04 MED ORDER — MIDAZOLAM HCL 2 MG/2ML IJ SOLN
INTRAMUSCULAR | Status: AC
  Filled 2022-05-04: qty 2

## 2022-05-04 MED ORDER — PROPOFOL 1000 MG/100ML IV EMUL
INTRAVENOUS | Status: AC
  Filled 2022-05-04: qty 100

## 2022-05-04 MED ORDER — FENTANYL CITRATE (PF) 100 MCG/2ML IJ SOLN: 25.0000 ug | INTRAMUSCULAR | Status: DC | PRN

## 2022-05-04 MED ORDER — ACETAMINOPHEN 10 MG/ML IV SOLN
INTRAVENOUS | Status: AC
  Filled 2022-05-04: qty 100

## 2022-05-04 MED ORDER — LIDOCAINE HCL (PF) 2 % IJ SOLN
INTRAMUSCULAR | Status: AC
  Filled 2022-05-04: qty 10

## 2022-05-04 MED ORDER — MEPERIDINE HCL 25 MG/ML IJ SOLN
25.0000 mg | Freq: Once | INTRAMUSCULAR | Status: AC
  Administered 2022-05-04: 25 mg via INTRAMUSCULAR

## 2022-05-04 MED ORDER — PROPOFOL 500 MG/50ML IV EMUL
INTRAVENOUS | Status: DC | PRN
  Administered 2022-05-04: 50 ug/kg/min via INTRAVENOUS

## 2022-05-04 MED ORDER — ACETAMINOPHEN 10 MG/ML IV SOLN
1000.0000 mg | Freq: Once | INTRAVENOUS | Status: AC
  Administered 2022-05-04: 1000 mg via INTRAVENOUS

## 2022-05-04 MED ORDER — STERILE WATER FOR IRRIGATION IR SOLN
Status: DC | PRN
  Administered 2022-05-04 (×2): 500 mL
  Administered 2022-05-04: 3000 mL

## 2022-05-04 MED ORDER — FENTANYL CITRATE (PF) 250 MCG/5ML IJ SOLN
INTRAMUSCULAR | Status: DC | PRN
  Administered 2022-05-04 (×4): 25 ug via INTRAVENOUS

## 2022-05-04 MED ORDER — LIDOCAINE 2% (20 MG/ML) 5 ML SYRINGE
INTRAMUSCULAR | Status: DC | PRN
  Administered 2022-05-04: 60 mg via INTRAVENOUS

## 2022-05-04 MED ORDER — SODIUM CHLORIDE 0.9% FLUSH: 3.0000 mL | Freq: Two times a day (BID) | INTRAVENOUS | Status: DC

## 2022-05-04 MED ORDER — FENTANYL CITRATE (PF) 100 MCG/2ML IJ SOLN
INTRAMUSCULAR | Status: AC
  Filled 2022-05-04: qty 2

## 2022-05-04 MED ORDER — MEPERIDINE HCL 25 MG/ML IJ SOLN: 50.0000 mg | Freq: Once | INTRAMUSCULAR | Status: DC

## 2022-05-04 MED ORDER — DEXAMETHASONE SODIUM PHOSPHATE 10 MG/ML IJ SOLN
INTRAMUSCULAR | Status: AC
  Filled 2022-05-04: qty 2

## 2022-05-04 MED ORDER — STERILE WATER FOR INJECTION IJ SOLN
Status: DC | PRN
  Administered 2022-05-04: 150 mL via INTRAVESICAL

## 2022-05-04 MED ORDER — CIPROFLOXACIN IN D5W 400 MG/200ML IV SOLN
400.0000 mg | INTRAVENOUS | Status: AC
  Administered 2022-05-04: 400 mg via INTRAVENOUS

## 2022-05-04 MED ORDER — PROPOFOL 10 MG/ML IV BOLUS
INTRAVENOUS | Status: DC | PRN
  Administered 2022-05-04: 20 mg via INTRAVENOUS

## 2022-05-04 MED ORDER — MIDAZOLAM HCL 2 MG/2ML IJ SOLN
INTRAMUSCULAR | Status: DC | PRN
  Administered 2022-05-04 (×2): 1 mg via INTRAVENOUS

## 2022-05-04 SURGICAL SUPPLY — 18 items
BAG DRAIN URO-CYSTO SKYTR STRL (DRAIN) ×2 IMPLANT
BAG DRN UROCATH (DRAIN) ×1
CATH SILICONE 16FRX5CC (CATHETERS) IMPLANT
CLOTH BEACON ORANGE TIMEOUT ST (SAFETY) ×2 IMPLANT
ELECT REM PT RETURN 9FT ADLT (ELECTROSURGICAL)
ELECTRODE REM PT RTRN 9FT ADLT (ELECTROSURGICAL) ×2 IMPLANT
GLOVE SURG SS PI 7.0 STRL IVOR (GLOVE) IMPLANT
GLOVE SURG SS PI 8.0 STRL IVOR (GLOVE) ×2 IMPLANT
GOWN STRL REUS W/TWL XL LVL3 (GOWN DISPOSABLE) ×2 IMPLANT
KIT TURNOVER CYSTO (KITS) ×2 IMPLANT
MANIFOLD NEPTUNE II (INSTRUMENTS) ×2 IMPLANT
NDL SAFETY ECLIP 18X1.5 (MISCELLANEOUS) IMPLANT
PACK CYSTO (CUSTOM PROCEDURE TRAY) ×2 IMPLANT
PLUG CATH AND CAP STER (CATHETERS) IMPLANT
SYR 30ML LL (SYRINGE) ×2 IMPLANT
TUBE CONNECTING 12X1/4 (SUCTIONS) IMPLANT
WATER STERILE IRR 3000ML UROMA (IV SOLUTION) ×2 IMPLANT
WATER STERILE IRR 500ML POUR (IV SOLUTION) IMPLANT

## 2022-05-04 NOTE — Transfer of Care (Signed)
Immediate Anesthesia Transfer of Care Note  Patient: Brenda [name]  Procedure(s) Performed: Procedure(s) (LRB): CYSTOSCOPY/HYDRODISTENSION INSTILL CLORPACTIN AND TETRACAINE (N/A)  Patient Location: PACU  Anesthesia Type: MAC  Level of Consciousness: awake, sedated, patient cooperative and responds to stimulation  Airway & Oxygen Therapy: Patient Spontanous Breathing and Patient connected to [place] oxygen  Post-op Assessment: Report given to PACU RN, Post -op Vital signs reviewed and stable and Patient moving all extremities  Post vital signs: Reviewed and stable  Complications: No apparent anesthesia complications

## 2022-05-04 NOTE — Telephone Encounter (Signed)
Received on (05/02/2022) via of fax STAT orders to be signed for Breast Mammogram and Ultrasound.  Given to provider to sign and date.    Order signed and faxed back to [facility].  Confirmation received and copy scanned into the chart.//AB/CMA

## 2022-05-04 NOTE — Discharge Instructions (Addendum)
CYSTOSCOPY HOME CARE INSTRUCTIONS  Activity: Rest for the remainder of the day.  Do not drive or operate equipment today.  You may resume normal activities in one to two days as instructed by your physician.   Meals: Drink plenty of liquids and eat light foods such as gelatin or soup this evening.  You may return to a normal meal plan tomorrow.  Return to Work: You may return to work in one to two days or as instructed by your physician.  Special Instructions / Symptoms: Call your physician if any of these symptoms occur:   -persistent or heavy bleeding  -bleeding which continues after first few urination  -large blood clots that are difficult to pass  -urine stream diminishes or stops completely  -fever equal to or higher than 101 degrees Farenheit.  -cloudy urine with a strong, foul odor  -severe pain  Females should always wipe from front to back after elimination.  You may feel some burning pain when you urinate.  This should disappear with time.  Applying moist heat to the lower abdomen or a hot tub bath may help relieve the pain. \   Patient Signature:  ________________________________________________________  Nurse's Signature:  ________________________________________________________ Post Anesthesia Home Care Instructions  Activity: Get plenty of rest for the remainder of the day. A responsible adult should stay with you for 24 hours following the procedure.  For the next 24 hours, DO NOT: -Drive a car -Paediatric nurse -Drink alcoholic beverages -Take any medication unless instructed by your physician -Make any legal decisions or sign important papers.  Meals: Start with liquid foods such as gelatin or soup. Progress to regular foods as tolerated. Avoid greasy, spicy, heavy foods. If nausea and/or vomiting occur, drink only clear liquids until the nausea and/or vomiting subsides. Call your physician if vomiting continues.  Special Instructions/Symptoms: Your  throat may feel dry or sore from the anesthesia or the breathing tube placed in your throat during surgery. If this causes discomfort, gargle with warm salt water. The discomfort should disappear within 24 hours.

## 2022-05-04 NOTE — Interval H&P Note (Deleted)
History and Physical Interval Note:  05/04/2022 11:59 AM  Brenda [name]  has presented today for surgery, with the diagnosis of INTERSTITIAL CYSTITIS.  The various methods of treatment have been discussed with the patient and family. After consideration of risks, benefits and other options for treatment, the patient has consented to  Procedure(s): CYSTOSCOPY/HYDRODISTENSION INSTILL CLORPACTIN AND TETRACAINE (N/A) as a surgical intervention.  The patient's history has been reviewed, patient examined, no change in status, stable for surgery.  I have reviewed the patient's chart and labs.  Questions were answered to the patient's satisfaction.     [name]

## 2022-05-04 NOTE — OR Nursing (Addendum)
Patient was given 30 cc tetracaine HCL 1:500 neonycin sulf 0.1% Sol at 1239. This medication was brought by patient.

## 2022-05-04 NOTE — OR Nursing (Signed)
Patient instills tetracaine into bladder via cathetor per Dr. Roni Bread order.  Verbal from Bland and nurse Tayte rn. Darin Engels rn. Cathetor balloon deflated and removed,

## 2022-05-04 NOTE — Op Note (Signed)
Procedure: Cystoscopy with hydrodistention with bladder instillation of Clorpactin and tetracaine.  Preop diagnosis: Interstitial cystitis.  Postop diagnosis: Same.  Surgeon: Dr. [name].  Anesthesia: MAC.  Drain: Latex free Foley catheter.  Specimen: None.  Blood loss: None.  Complications: None.  Indications: Patient is a 72 year old female with history of chronic interstitial cystitis who was recently had a pain flare is elected hydrodistention for management.  Procedure: She was taken the operating room where she was given Cipro IV.  Sedation was given as needed.  She was placed in lithotomy position and fitted with PAS hose.  Her perineum and genitalia were prepped with Betadine.  Cystoscopy was performed using a 21 [place] scope and 30 degree lens.  Examination revealed a normal bladder wall without tumors, stones or inflammation.  Ureteral orifices were unremarkable.  Urethra was unremarkable.  After completion of cystoscopy the bladder was filled to capacity at 80 cm of water pressure.  Her capacity under anesthesia was 600 mL.  And repeat cystoscopy after dilation demonstrated diffuse glomerular hemorrhages consistent with interstitial cystitis.  The cystoscope was removed and a 16 French latex free Foley was placed.  The balloon was filled with 10 mL of sterile fluid.  Approximately 200 mL of Clorpactin solution was instilled and left indwelling for approximately a minute.  The bladder was then drained and irrigated with sterile water.  Finally she was instilled with 30 mg of tetracaine and the catheter was plugged.  She was taken down from lithotomy position, her sedation was reversed and she was moved to recovery room in stable condition.  The catheter will be removed in 15 minutes from instillation of the tetracaine.  There were no complications.

## 2022-05-04 NOTE — Anesthesia Preprocedure Evaluation (Addendum)
Anesthesia Evaluation  Patient identified by MRN, date of birth, ID band Patient awake    Reviewed: Patient's Chart, lab work & pertinent test results  History of Anesthesia Complications (+) PONV and history of anesthetic complications  Airway Mallampati: II  TM Distance: >3 FB Neck ROM: Full    Dental  (+) Teeth Intact   Pulmonary asthma    Pulmonary exam normal        Cardiovascular hypertension, Pt. on medications and Pt. on home beta blockers  Rhythm:Regular Rate:Normal     Neuro/Psych  Headaches  Anxiety Depression    CVA, No Residual Symptoms    GI/Hepatic Neg liver ROS,GERD  Medicated,,  Endo/Other  negative endocrine ROS    Renal/GU Renal InsufficiencyRenal disease Bladder dysfunction  Interstitial cystitis, urethral stenosis    Musculoskeletal  (+) Arthritis , Osteoarthritis,  Fibromyalgia -  Abdominal  (+)  Abdomen: soft. Bowel sounds: normal.  Peds  Hematology negative hematology ROS (+)   Anesthesia Other Findings   Reproductive/Obstetrics                             Anesthesia Physical Anesthesia Plan  ASA: 3  Anesthesia Plan: MAC   Post-op Pain Management: Minimal or no pain anticipated   Induction: Intravenous  PONV Risk Score and Plan: 3 and Ondansetron, Dexamethasone, Treatment may vary due to age or medical condition and Midazolam  Airway Management Planned: Natural Airway and Simple Face Mask  Additional Equipment: None  Intra-op Plan:   Post-operative Plan:   Informed Consent: I have reviewed the patients History and Physical, chart, labs and discussed the procedure including the risks, benefits and alternatives for the proposed anesthesia with the patient or authorized representative who has indicated his/her understanding and acceptance.     Dental advisory given  Plan Discussed with: CRNA and Surgeon  Anesthesia Plan Comments:          Anesthesia Quick Evaluation

## 2022-05-04 NOTE — Anesthesia Procedure Notes (Signed)
Procedure Name: MAC Date/Time: 05/04/2022 12:19 PM  Performed by: Justice Rocher, CRNAPre-anesthesia Checklist: Timeout performed, Patient being monitored, Suction available, Emergency Drugs available and Patient identified Patient Re-evaluated:Patient Re-evaluated prior to induction Oxygen Delivery Method: Simple face mask Preoxygenation: Pre-oxygenation with 100% oxygen Induction Type: IV induction Placement Confirmation: breath sounds checked- equal and bilateral, CO2 detector and positive ETCO2

## 2022-05-04 NOTE — H&P (Deleted)
I have kidney stones.  HPI: Brenda [name] is a 72 year-old female established patient who is here for renal calculi.  She has had ureteral stent and ureteroscopy for treatment of her stones in the past.   05/02/22: Brenda [name] today in f/u. She has had some frequency and urgency for a few weeks and some left flank pain. She had some spinal hardware removed a month ago. She has had no hematuria. Her KUB today shows a 4x52m calcification in the area of the right UVJ that wasn't present on her last 2 films.   04/14/2021: Brenda Losepresents today for evaluation and management of renal stones. She reports an episode of flank pain associated with hematuria and possible stone passage 1 month ago. This is resolved. She denies interval UTIs, fevers or chills. Her KUB today shows resolution of left-sided renal stones but continued to have stable right-sided calculi. There is no evidence of ureteral stones.   10/14/20: Brenda Losereturns today in f/u for her history of stones. She has had no flank pain or hematuria. She has low back pain but has a history of lumbar DDD and prior surgery. She has had some urinary frequency. The KUB today shows only small renal stones and no obvious ureteral stones. There is some interval growth of the left renal stones but they are still only 2-345min the mid and lower pole. She remains on Topomax but has reduced the dose. She tried to stop it but her headaches returned. Her UA is clear today.    03/28/20: Brenda Loseeturns today in f/u for her history of stones. 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 She has no symptoms and her UA is clear.   08/24/19: [name] R sided URS with Stent placement that was uncomplicated. She pulled her Stent a few days ago and has been reporting continued nausea, right lower back discomfort, urinary frequency, and feeling very jittery. She has been using zofran po for management of nausea but  has not taken any since Saturday. She denies gross hematuria. She denies fevers. She endorses constipation but is passing gas, but has been unable to have a decent BM since her surgery.   08/19/19: She is s/p right URS on 4/19. She was left with right ureteral stent with tether, which remains in place. She presents today with complaints of refractory nausea and vomiting. She has been using promethazine for management, but is been having episodes of emesis after using the therapy and she feels this is not effective. She last attempted to use 12.5 mg approximatly 2 hours ago. She also complains of moderate to severe right flank and abdominal pain. She has been trying to use pain medication, but has been unable to keep medication down due to emesis. She states that she has been taking small sips of fluids and seems to be able to tolerate small amounts at a time. She does complain of exacerbation of lower urinary tract symptoms and hematuria. She denies clots or difficulties voiding. She denies fevers or chills. No dizziness or lightheadedness. She is scheduled to remove her stent in the morning.   08/13/19: Brenda [name] today with right flank and right upper abdominal pain described as intermittent and severe associated with nausea. She has a hx of stones and always opts for URS. She denies fevers, chills, dysuria and hematuria.   04/30/19: Brenda [name] today with some RLQ pain over the last few days  and more severe mid lower back pain. She has know right renal stones but also back issues. She has no hematuria or nausea. UA today is clear. She has no associated signs or symptoms.    03/09/2019: Brenda [name] today in f/u for her history of stones. She has stopped the topomax. She has had some pain in the left lower quadrant for the last 2 months and has had some in the flank but she also has some chronic back pain. She has had no hematuria or dysuria. KUB today shows a 29m left proximal stone with  otherwise stable renal stones.        ALLERGIES: Sulfa Drugs    MEDICATIONS: Nexium  Celexa 20 mg tablet  Topamax     Notes: Vaginal prescription cream   GU PSH: Cysto Uretero Lithotripsy - 2014, 2009 Cystoscopy Insert Stent - 2014, 2014, 2009 Cystoscopy Ureteroscopy - 2014 Ureteroscopic laser litho, Right - 2021, Left - 2020, Right - 2019 Ureteroscopic stone removal, Right - 2019       PSH Notes: Cystoscopy With Ureteroscopy, Cystoscopy With Insertion Of Ureteral Stent Left, Cystoscopy With Ureteroscopy With Lithotripsy, Cystoscopy With Insertion Of Ureteral Stent Left, Hand Surgery, Cystoscopy With Insertion Of Ureteral Stent Left, Cystoscopy With Ureteroscopy With Lithotripsy, Appendectomy, Decompression Of Median Nerve At Carpal Tunnel, Gallbladder Surgery, Foot Surgery   NON-GU PSH: Appendectomy - 2008 Back surgery Carpal Tunnel Surgery.. - 2008     GU PMH: Renal calculus - 04/14/2021, She has bilateral small renal stones with some interval growth on the left but the stones are only 2-332m She is back on the topomax and is unlikely to come off of that again since her migraines recurred off of it. She will return in 32m532moth a KUB. , - 10/14/2020, She has small bilateral stones but nothing that I would recommended preventative intervention for. I have given her a few oxycodone and ondansetron to have on hand should she have symptoms when she goes on a cruise in February. , - 03/28/2020, - 2021, - 2021, - 2021 (Stable), - 2020 (Stable), Her stones are stable. She has some RLQ pain and I will see whether the MRI demonstates hydro. Otherwise I will get her set up for a metabolic w/u for her stones and will have her return with the results. , - 2020, - 2019, - 2019, She has interval growth of the bilateral renal stones, - 2019, Nephrolithiasis, - 2015 Hypocitraturia, I discussed potassium citrate and crystal light to supplement the citrate but she has bad GERD and doesn't think she  could do that. - 10/14/2020, She has very low citrate with a pH of 6.9 on topomax which is causing an RTA like picture. She will discuss weaning off of the topomax with Dr. VyaWoody Seller discussed increasing potassium citrate containing foods. She is not a candidate for potassium citrate tabs because of severe GERD. She doesn't tolerate citrus fruit but can drink Sprite and other clear sodas. , - 2020 Renal and ureteral calculus, Left - 2020, - 2019 RLQ pain (Worsening) - 2020 Ureteral calculus, Calculus of left ureter - 2014, Ureteral Stone, - 2014, Ureteral Stone, - 2014 Urinary Urgency, Urinary urgency - 2014    NON-GU PMH: Oliguria, Her 24hr urine volume was only 710m12md I discussed the need to get the volume up to 2000ml65m2020 Encounter for general adult medical examination without abnormal findings, Encounter for preventive health examination - 2014 Anxiety, Anxiety (Symptom) - 2014 Asthma, Asthma - 2014 Personal  history of other diseases of the digestive system, History of esophageal reflux - 2014 Personal history of other diseases of the nervous system and sense organs, History of migraine headaches - 2014 GERD    FAMILY HISTORY: Aneurysm - Mother Death - Father, Mother Diabetes - Mother nephrolithiasis - Brother Parkinson's Disease - Father   SOCIAL HISTORY: Marital Status: Widowed Preferred Language: English; Ethnicity: Not Hispanic Or Latino; Race: White Current Smoking Status: Patient has never smoked.   Tobacco Use Assessment Completed: Used Tobacco in last 30 days? Does not drink anymore.  Drinks 3 caffeinated drinks per day. 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     Notes: Occupation:, Marital History - Currently Married, Never A Smoker, Caffeine Use, Marital History - Widowed, Tobacco Use, Alcohol Use   REVIEW OF SYSTEMS:    GU Review Female:   Patient reports frequent urination and get up at night to urinate. Patient denies hard to postpone urination, burning /pain  with urination, leakage of urine, stream starts and stops, trouble starting your stream, have to strain to urinate, and being pregnant.  Gastrointestinal (Upper):   Patient denies nausea, vomiting, and indigestion/ heartburn.  Gastrointestinal (Lower):   Patient denies diarrhea and constipation.  Constitutional:   Patient denies fever, night sweats, weight loss, and fatigue.  Skin:   Patient denies skin rash/ lesion and itching.  Eyes:   Patient denies blurred vision and double vision.  Ears/ Nose/ Throat:   Patient denies sore throat and sinus problems.  Hematologic/Lymphatic:   Patient denies swollen glands and easy bruising.  Cardiovascular:   Patient denies leg swelling and chest pains.  Respiratory:   Patient denies cough and shortness of breath.  Endocrine:   Patient denies excessive thirst.  Musculoskeletal:   Patient denies back pain and joint pain.  Neurological:   Patient denies headaches and dizziness.  Psychologic:   Patient denies depression and anxiety.   VITAL SIGNS:      05/02/2022 09:39 AM  Weight 152 lb / 68.95 kg  Height 67 in / 170.18 cm  BP 103/67 mmHg  Heart Rate 73 /min  Temperature 98.4 F / 36.8 C  BMI 23.8 kg/m   MULTI-SYSTEM PHYSICAL EXAMINATION:    Constitutional: Well-nourished. No physical deformities. Normally developed. Good grooming.   Respiratory: Normal breath sounds. No labored breathing, no use of accessory muscles.   Cardiovascular: Regular rate and rhythm. No murmur, no gallop.   Gastrointestinal: Abdominal tenderness, mild right flank pain. No mass, no rigidity, non obese abdomen.      Complexity of Data:  Records Review:   AUA Symptom Score, Previous Patient Records  Urine Test Review:   Urinalysis  X-Ray Review: KUB: Reviewed Films. Discussed With Patient.     PROCEDURES:         KUB - [id number]  A single view of the abdomen is obtained. There is a 4x66m stone in the area of the RUVJ and 3 small right renal stones with some increase in size  over the past year. There are small stable LLP stones. She has had spinal hardware removed. She has lumbar DDD. no gas or soft tissue abnormalities are noted.       . Patient confirmed No Neulasta OnPro Device.           Urinalysis Dipstick Dipstick Cont'd  Color: Yellow Bilirubin: Neg mg/dL  Appearance: Clear Ketones: Neg mg/dL  Specific Gravity: 1.015 Blood: Neg ery/uL  pH: 6.5 Protein: Neg mg/dL  Glucose: Neg mg/dL Urobilinogen: 0.2 mg/dL  Nitrites: Neg    Leukocyte Esterase: Neg leu/uL    ASSESSMENT:      ICD-10 Details  1 GU:   Renal calculus - N20.0 Chronic, Worsening - She has an increased right renal stone burden and a right UVJ stone with sypmtoms. I discussed options and she would like to go ahead with URS for the distal and renal stones. I have reviewed the risks of ureteroscopy including bleeding, infection, ureteral injury, need for a stent or secondary procedures, thrombotic events and anesthetic complications.    2   Ureteral calculus - N20.1 Right, Acute, Uncomplicated   PLAN:            Medications New Meds: Oxycodone-Acetaminophen 5 mg-325 mg tablet 1 tablet PO Q 6 H PRN   #6  0 Refill(s)  Promethazine Hcl 25 mg tablet 1 tablet PO Q 6 H   #6  1 Refill(s)  Pharmacy Name:  [facility]  Address:  [place], [place] [address]  Phone:  [phone]  Fax:  [phone]    Stop Meds: Ondansetron Odt 8 mg tablet,disintegrating 1 tablet PO Q 8 H PRN  Start: 03/28/2020  Discontinue: 05/02/2022  - Reason: The medication cycle was completed.            Schedule Return Visit/Planned Activity: ASAP - Schedule Surgery  Procedure: Unspecified Date - Cysto Uretero Lithotripsy - [phone], right          Document Letter(s):  Created for Patient: Clinical Summary

## 2022-05-06 NOTE — Anesthesia Postprocedure Evaluation (Signed)
Anesthesia Post Note  Patient: Brenda [name]  Procedure(s) Performed: CYSTOSCOPY/HYDRODISTENSION INSTILL CLORPACTIN AND TETRACAINE (Bladder)     Patient location during evaluation: PACU Anesthesia Type: General Level of consciousness: awake and alert Pain management: pain level controlled Vital Signs Assessment: post-procedure vital signs reviewed and stable Respiratory status: spontaneous breathing, nonlabored ventilation, respiratory function stable and patient connected to nasal cannula oxygen Cardiovascular status: blood pressure returned to baseline and stable Postop Assessment: no apparent nausea or vomiting Anesthetic complications: no   No notable events documented.  Last Vitals:  Vitals:   05/04/22 1315 05/04/22 1405  BP: (!) 140/112 122/73  Pulse: 62 66  Resp: (!) 24 18  Temp:    SpO2: 100% 100%    Last Pain:  Vitals:   05/04/22 1027  TempSrc: Oral  PainSc: [name]

## 2022-05-07 ENCOUNTER — Encounter (HOSPITAL_BASED_OUTPATIENT_CLINIC_OR_DEPARTMENT_OTHER): Payer: Self-pay | Admitting: Urology

## 2022-05-09 ENCOUNTER — Encounter: Payer: Self-pay | Admitting: Cardiovascular Disease

## 2022-05-12 ENCOUNTER — Ambulatory Visit: Payer: PPO

## 2022-05-12 ENCOUNTER — Ambulatory Visit
Admission: RE | Admit: 2022-05-12 | Discharge: 2022-05-12 | Disposition: A | Discharge status: Home / Self Care | Payer: PPO | Source: Ambulatory Visit | Attending: Plastic Surgery | Admitting: Plastic Surgery

## 2022-05-12 DIAGNOSIS — N62 Hypertrophy of breast: Secondary | ICD-10-CM

## 2022-05-12 DIAGNOSIS — N649 Disorder of breast, unspecified: Secondary | ICD-10-CM

## 2022-05-12 DIAGNOSIS — R928 Other abnormal and inconclusive findings on diagnostic imaging of breast: Secondary | ICD-10-CM | POA: Diagnosis not present

## 2022-05-14 NOTE — Progress Notes (Unsigned)
Chief Complaint:   OBESITY Brenda [name] is here to discuss her progress with her obesity treatment plan along with follow-up of her obesity related diagnoses. Brenda [name] is on keeping a food journal and adhering to recommended goals of 1000 calories and 75+ grams of protein and states she is following her eating plan approximately 65% of the time. Brenda [name] states she is using the total gym for 10 minutes 1-2 times per week.  Today's visit was #: 10 Starting weight: 147 lbs Starting date: 08/03/2021 Today's weight: 143 lbs Today's date: 05/01/2022 Total lbs lost to date: 4 Total lbs lost since last in-office visit: 2  Interim History: Brenda [name] has done well with avoiding holiday weight gain, and she has even lost 2 pounds since our last visit.  She wants to lose another 20 pounds, which may cause her to lose muscle mass especially since she struggles to meet her protein goals.  Subjective:   1. Prediabetes Brenda [name]'s last A1c at our office was 6.1.  She has done well with her diet, exercise, and weight loss but she did indulge a bit more over Christmas.  2. Vitamin D deficiency Brenda [name] is now on OTC vitamin D, and she is due to have labs rechecked.  3. Mixed hyperlipidemia Brenda [name] is working on her diet and exercise, and she is due for labs.  She is on Zetia as well.  Assessment/Plan:   1. Prediabetes We will check labs today, and we will follow-up at Brenda [name]'s next visit.  - CMP14+EGFR - Insulin, random - Hemoglobin A1c  2. Vitamin D deficiency We will check labs today.  Brenda [name] will continue OTC vitamin D.  - VITAMIN D 25 Hydroxy (Vit-D Deficiency, Fractures)  3. Mixed hyperlipidemia We will check labs today.  Brenda [name] will continue with her diet, exercise, and weight loss.  - CBC with Differential/Platelet - Lipid Panel With LDL/HDL Ratio  4. Obesity, Current BMI 29.0 Brenda [name] is currently in the action stage of change. As such, her goal is to continue with weight loss efforts. She has  agreed to keeping a food journal and adhering to recommended goals of 1200 calories and 75+ grams of protein daily.   Exercise goals: As is.   Behavioral modification strategies: increasing lean protein intake.  Brenda [name] has agreed to follow-up with our clinic in 4 weeks. She was informed of the importance of frequent follow-up visits to maximize her success with intensive lifestyle modifications for her multiple health conditions.   Brenda [name] was informed we would discuss her lab results at her next visit unless there is a critical issue that needs to be addressed sooner. Brenda [name] agreed to keep her next visit at the agreed upon time to discuss these results.  Objective:   Blood pressure 124/67, pulse 68, temperature (!) 97.4 F (36.3 C), height '4\' 11"'$  (1.499 m), weight 143 lb (64.9 kg), SpO2 97 %. Body mass index is 28.88 kg/m.  General: Cooperative, alert, well developed, in no acute distress. HEENT: Conjunctivae and lids unremarkable. Cardiovascular: Regular rhythm.  Lungs: Normal work of breathing. Neurologic: No focal deficits.   Lab Results  Component Value Date   CREATININE 1.04 (H) 05/01/2022   BUN 19 05/01/2022   NA 142 05/01/2022   K 4.6 05/01/2022   CL 104 05/01/2022   CO2 23 05/01/2022   Lab Results  Component Value Date   ALT 25 05/01/2022   AST 22 05/01/2022   ALKPHOS 118 05/01/2022   BILITOT 0.4 05/01/2022   Lab Results  Component  Value Date   HGBA1C 6.1 (H) 05/01/2022   HGBA1C 6.1 (H) 12/19/2021   HGBA1C 6.1 (H) 08/03/2021   Lab Results  Component Value Date   INSULIN 10.7 05/01/2022   INSULIN 10.4 12/19/2021   INSULIN 4.9 08/03/2021   Lab Results  Component Value Date   TSH 2.170 12/19/2021   Lab Results  Component Value Date   CHOL 164 05/01/2022   HDL 66 05/01/2022   LDLCALC 73 05/01/2022   TRIG 147 05/01/2022   Lab Results  Component Value Date   VD25OH 48.2 05/01/2022   VD25OH 56.8 12/19/2021   VD25OH 90.7 08/03/2021   Lab Results   Component Value Date   WBC 7.0 05/01/2022   HGB 13.6 05/01/2022   HCT 41.0 05/01/2022   MCV 93 05/01/2022   PLT 259 05/01/2022   No results found for: "IRON", "TIBC", "FERRITIN"  Attestation Statements:   Reviewed by clinician on day of visit: allergies, medications, problem list, medical history, surgical history, family history, social history, and previous encounter notes.   I, Trixie Dredge, am acting as transcriptionist for Dennard Nip, MD.  I have reviewed the above documentation for accuracy and completeness, and I agree with the above. -  Dennard Nip, MD

## 2022-05-15 ENCOUNTER — Telehealth: Payer: Self-pay | Admitting: *Deleted

## 2022-05-15 NOTE — Telephone Encounter (Signed)
Called pt per Dr. Eusebio Friendly request to notify her of her mammogram results (benign oil cyst). Pt states she had discussed results with radiologist and would like to talk to Dr. Marla Roe about potential removal. Call transferred to [facility] for appt scheduling.

## 2022-05-21 ENCOUNTER — Encounter (INDEPENDENT_AMBULATORY_CARE_PROVIDER_SITE_OTHER): Payer: Self-pay

## 2022-05-21 DIAGNOSIS — Z719 Counseling, unspecified: Secondary | ICD-10-CM

## 2022-05-22 ENCOUNTER — Encounter: Payer: Self-pay | Admitting: Plastic Surgery

## 2022-05-22 ENCOUNTER — Ambulatory Visit (INDEPENDENT_AMBULATORY_CARE_PROVIDER_SITE_OTHER): Payer: PPO | Admitting: Plastic Surgery

## 2022-05-22 ENCOUNTER — Other Ambulatory Visit (HOSPITAL_COMMUNITY)
Admission: RE | Admit: 2022-05-22 | Discharge: 2022-05-22 | Disposition: A | Discharge status: Home / Self Care | Payer: PPO | Source: Ambulatory Visit | Attending: Plastic Surgery | Admitting: Plastic Surgery

## 2022-05-22 VITALS — BP 120/77 | HR 67

## 2022-05-22 DIAGNOSIS — N6031 Fibrosclerosis of right breast: Secondary | ICD-10-CM | POA: Diagnosis not present

## 2022-05-22 DIAGNOSIS — N649 Disorder of breast, unspecified: Secondary | ICD-10-CM | POA: Insufficient documentation

## 2022-05-22 DIAGNOSIS — N6001 Solitary cyst of right breast: Secondary | ICD-10-CM

## 2022-05-22 NOTE — Addendum Note (Signed)
Addended by: Wallace Going on: 05/22/2022 01:47 PM   Modules accepted: Orders

## 2022-05-22 NOTE — Progress Notes (Signed)
Procedure Note  Preoperative Dx: mass right breast  Postoperative Dx: Same  Procedure: Excision of cyst of right breast 6 mm  Anesthesia: Lidocaine 1% with 1:100,000 epinephrine   Description of Procedure: Risks and complications were explained to the patient.  Consent was confirmed and the patient understands the risks and benefits.  The potential complications and alternatives were explained and the patient consents.  The patient expressed understanding the option of not having the procedure and the risks of a scar.  Time out was called and all information was confirmed to be correct.    The area was prepped and drapped.  Lidocaine 1% with epinephrine was injected in the subcutaneous area.  After waiting several minutes for the local to take affect a #15 blade was used to incise the skin over the area. The 6 mm lesion was not deep.  I was able to see it and remove it completely.  The skin edges were reapproximated with 5-0 Monocryl.  A dressing was applied.  The patient was given instructions on how to care for the area and a follow up appointment.  Brenda [name] tolerated the procedure well and there were no complications. The specimen was sent to pathology.

## 2022-05-26 ENCOUNTER — Other Ambulatory Visit: Payer: Self-pay | Admitting: Psychiatry

## 2022-05-26 DIAGNOSIS — F411 Generalized anxiety disorder: Secondary | ICD-10-CM

## 2022-05-26 DIAGNOSIS — F4001 Agoraphobia with panic disorder: Secondary | ICD-10-CM

## 2022-05-27 LAB — SURGICAL PATHOLOGY

## 2022-05-28 DIAGNOSIS — Z1272 Encounter for screening for malignant neoplasm of vagina: Secondary | ICD-10-CM | POA: Diagnosis not present

## 2022-05-28 DIAGNOSIS — Z124 Encounter for screening for malignant neoplasm of cervix: Secondary | ICD-10-CM | POA: Diagnosis not present

## 2022-05-28 DIAGNOSIS — N952 Postmenopausal atrophic vaginitis: Secondary | ICD-10-CM | POA: Diagnosis not present

## 2022-05-28 DIAGNOSIS — Z6830 Body mass index (BMI) 30.0-30.9, adult: Secondary | ICD-10-CM | POA: Diagnosis not present

## 2022-05-29 ENCOUNTER — Ambulatory Visit (INDEPENDENT_AMBULATORY_CARE_PROVIDER_SITE_OTHER): Payer: PPO | Admitting: Family Medicine

## 2022-05-29 ENCOUNTER — Telehealth: Payer: Self-pay | Admitting: *Deleted

## 2022-05-29 ENCOUNTER — Encounter (INDEPENDENT_AMBULATORY_CARE_PROVIDER_SITE_OTHER): Payer: Self-pay | Admitting: Family Medicine

## 2022-05-29 VITALS — BP 125/75 | HR 58 | Temp 97.6°F | Wt 144.4 lb

## 2022-05-29 DIAGNOSIS — E669 Obesity, unspecified: Secondary | ICD-10-CM

## 2022-05-29 DIAGNOSIS — E88819 Insulin resistance, unspecified: Secondary | ICD-10-CM | POA: Insufficient documentation

## 2022-05-29 DIAGNOSIS — Z6829 Body mass index (BMI) 29.0-29.9, adult: Secondary | ICD-10-CM | POA: Diagnosis not present

## 2022-05-29 DIAGNOSIS — Z6828 Body mass index (BMI) 28.0-28.9, adult: Secondary | ICD-10-CM | POA: Insufficient documentation

## 2022-05-29 MED ORDER — METFORMIN HCL 500 MG PO TABS: 500.0000 mg | ORAL_TABLET | Freq: Every day | ORAL | 0 refills | Status: DC

## 2022-05-29 NOTE — Telephone Encounter (Signed)
Pt notified per Dr. Marla Roe surgical pathology results are benign. Pt verbalized understanding. No questions voiced.

## 2022-06-01 ENCOUNTER — Ambulatory Visit (INDEPENDENT_AMBULATORY_CARE_PROVIDER_SITE_OTHER): Payer: PPO | Admitting: Student

## 2022-06-01 DIAGNOSIS — N649 Disorder of breast, unspecified: Secondary | ICD-10-CM

## 2022-06-01 DIAGNOSIS — N3010 Interstitial cystitis (chronic) without hematuria: Secondary | ICD-10-CM | POA: Diagnosis not present

## 2022-06-01 NOTE — Progress Notes (Signed)
Patient is a 72 year old female with history of a mass to her right breast.  She underwent excision of the cyst to the right breast with Dr. Marla Roe on 05/22/2022.  During the procedure, the 6 mm lesion was excised.  The skin edges were approximated with 5-0 Monocryl.  Specimen was sent to pathology.  Pathology showed the soft tissue mass was a benign simple cyst with associated fibrosis and fibroadipose tissue.  It was negative for malignancy.  She presents to the clinic today for postprocedural follow-up.  Today, patient reports she is doing well.  She reports that she changed the Steri-Strip one time.  She has no complaints or concerns regarding the procedure site.  She denies any fevers, chills or drainage.  She also reports that she sometimes has a little bit of pain where she had surgery previously back in 2022 to the right lateral aspect of her breast.  She states the pain is mild and that it comes and goes.  She states that she has been applying Voltaren cream to this area and believes that it is helping.    I discussed the results of the pathology with the patient.  Chaperone present on exam.  On exam, patient is sitting upright in no acute distress.  Procedure site is intact with a Steri-Strip.  Steri-Strip was removed.  Incision is intact with Monocryl suture.  There appears to be some mild surrounding irritation.  There is no swelling or drainage noted on exam.  There are no signs of infection on exam.  Sutures were cut and removed.  Patient tolerated well.  To the lateral right breast, there are no masses or lumps palpated.  There are no overlying skin changes.  There may be a little bit of firmness consistent with some scarring in the area.  I discussed with the patient that she should apply Vaseline daily to the procedure site for the next 2 to 3 weeks.  I discussed with the patient that she may start using scar creams in about 3 weeks or whenever the irritation improves.  Patient expressed  understanding.  I discussed with the patient to continue to monitor the area to her right lateral breast.  I discussed with the patient to gently massage the area.  Patient expressed understanding.  Patient to follow back up in 1 month.  I instructed the patient to call in the meantime if she has any questions or concerns.

## 2022-06-05 DIAGNOSIS — L821 Other seborrheic keratosis: Secondary | ICD-10-CM | POA: Diagnosis not present

## 2022-06-05 DIAGNOSIS — D225 Melanocytic nevi of trunk: Secondary | ICD-10-CM | POA: Diagnosis not present

## 2022-06-05 DIAGNOSIS — D1801 Hemangioma of skin and subcutaneous tissue: Secondary | ICD-10-CM | POA: Diagnosis not present

## 2022-06-05 DIAGNOSIS — D2272 Melanocytic nevi of left lower limb, including hip: Secondary | ICD-10-CM | POA: Diagnosis not present

## 2022-06-05 DIAGNOSIS — D2262 Melanocytic nevi of left upper limb, including shoulder: Secondary | ICD-10-CM | POA: Diagnosis not present

## 2022-06-12 NOTE — Progress Notes (Signed)
Chief Complaint:   OBESITY Brenda [name] is here to discuss her progress with her obesity treatment plan along with follow-up of her obesity related diagnoses. Brenda [name] is on keeping a food journal and adhering to recommended goals of 1200 calories and 75+ grams of protein and states she is following her eating plan approximately 65% of the time. Brenda [name] states she is at the gym and doing aerobics for 10 minutes 2 times per week.  Today's visit was #: 11 Starting weight: 147 lbs Starting date: 08/03/2021 Today's weight: 144 lbs Today's date: 05/29/2022 Total lbs lost to date: 3 Total lbs lost since last in-office visit: 0  Interim History: Brenda [name] is frustrated with her weight loss. She notes some days she has limited hunger. She eats out frequently but she tried to make smarter choices. She is trying to increase her water intake and decrease liquid calories. She has questions about [employer] foods.   Subjective:   1. Insulin resistance Brenda [name] has questions about metformin and how it can help her.   Assessment/Plan:   1. Insulin resistance Brenda [name] agreed to start metformin 500 mg q AM with no refills. She was educated on mechanisms of action and how it can help prevent diabetes mellitus. She does not have to take forever but it also has a favorable effects on MI risks.   - metFORMIN (GLUCOPHAGE) 500 MG tablet; Take 1 tablet (500 mg total) by mouth daily with breakfast.  Dispense: 30 tablet; Refill: 0  2. BMI 29.0-29.9,adult  3. Obesity, Beginning BMI 30.0 Brenda [name] is currently in the action stage of change. As such, her goal is to continue with weight loss efforts. She has agreed to keeping a food journal and adhering to recommended goals of 1000 calories and 75 grams of protein daily.   Exercise goals: As is.   Behavioral modification strategies: increasing lean protein intake.  Brenda [name] has agreed to follow-up with our clinic in 4 weeks. She was informed of the importance of frequent  follow-up visits to maximize her success with intensive lifestyle modifications for her multiple health conditions.   Objective:   Blood pressure 125/75, pulse (!) 58, temperature 97.6 F (36.4 C), weight 144 lb 6.4 oz (65.5 kg), SpO2 96 %. Body mass index is 28.68 kg/m.  General: Cooperative, alert, well developed, in no acute distress. HEENT: Conjunctivae and lids unremarkable. Cardiovascular: Regular rhythm.  Lungs: Normal work of breathing. Neurologic: No focal deficits.   Lab Results  Component Value Date   CREATININE 1.04 (H) 05/01/2022   BUN 19 05/01/2022   NA 142 05/01/2022   K 4.6 05/01/2022   CL 104 05/01/2022   CO2 23 05/01/2022   Lab Results  Component Value Date   ALT 25 05/01/2022   AST 22 05/01/2022   ALKPHOS 118 05/01/2022   BILITOT 0.4 05/01/2022   Lab Results  Component Value Date   HGBA1C 6.1 (H) 05/01/2022   HGBA1C 6.1 (H) 12/19/2021   HGBA1C 6.1 (H) 08/03/2021   Lab Results  Component Value Date   INSULIN 10.7 05/01/2022   INSULIN 10.4 12/19/2021   INSULIN 4.9 08/03/2021   Lab Results  Component Value Date   TSH 2.170 12/19/2021   Lab Results  Component Value Date   CHOL 164 05/01/2022   HDL 66 05/01/2022   LDLCALC 73 05/01/2022   TRIG 147 05/01/2022   Lab Results  Component Value Date   VD25OH 48.2 05/01/2022   VD25OH 56.8 12/19/2021   VD25OH 90.7 08/03/2021  Lab Results  Component Value Date   WBC 7.0 05/01/2022   HGB 13.6 05/01/2022   HCT 41.0 05/01/2022   MCV 93 05/01/2022   PLT 259 05/01/2022   No results found for: "IRON", "TIBC", "FERRITIN"  Attestation Statements:   Reviewed by clinician on day of visit: allergies, medications, problem list, medical history, surgical history, family history, social history, and previous encounter notes.  Time spent on visit including pre-visit chart review and post-visit care and charting was 30 minutes.   I, Trixie Dredge, am acting as transcriptionist for Dennard Nip,  MD.  I have reviewed the above documentation for accuracy and completeness, and I agree with the above. -  Dennard Nip, MD

## 2022-06-14 NOTE — H&P (Signed)
[name], MD Physician Urology   H&P    Signed      I have interstitial cystitis.      Brenda [name] has increased nocturia and feels she needs cystoscopy with HOD which she has required many times in the past. She does have some pyuria and bacteria on a UA today and I will get a culture.      ALLERGIES: Codeine Derivatives - elevated liver enzymes Dilaudid TABS - Itching latex Lisinopril TABS - cough Marcaine SOLN - tachycardia and presyncope Morphine Derivatives - Itching, Hives, Spasm of sphincter of ODIE and pancreatitis Stadol SOLN - Swelling, tongue swelling and lips numb     MEDICATIONS: Metoprolol Tartrate 50 mg tablet 1 tablet PO Daily  Calcium Plus Vitamin D 1 PO Daily  Folic Acid 1 mg tablet 1 tablet PO Daily  Lorazepam 1 mg tablet 1 tablet PO Daily  Losartan Potassium  Probiotic 1 PO Daily  Prozac 10 mg capsule  Repatha Sureclick [id number] mg/ml pen injector  Ventolin Hfa 1 PO Daily  Vitamin C 1 PO Daily  Vitamin D2 1,250 mcg (50,000 unit) capsule 1 capsule PO Daily  Zetia 10 mg tablet 1 tablet PO Daily      GU PSH: Cysto Bladder Ureth Biopsy - 2012 Cysto Dilate Stricture (M or F) - 2013 Cystoscopy Hydrodistention - 04/02/2019, 03/06/2018, 2018, 2013, 2011, 2010, 2010, 2008 D&C Non-OB - 1987 Hysterectomy Unilat SO - 1987 Ovary Removal Partial or Total - 2012 Subseq Female Urethral Dilation - 2019        PSH Notes: Bladder Irrigation- multiple(s)  Corneal LASIK Bilateral, bilateral breast reduction with mastopexy 1987 Dr. Dessie Coma      NON-GU PSH: Appendectomy - 2012 Breast Reduction, Bilateral - 10/14/2018, 1987 Cataract surgery, Bilateral Cholecystectomy (open) - 2012 Colonoscopy Diagnostic Colonoscopy - 2012 Endo Cholangiopancreatograph - 2012 Hemorrhoidectomy (favorite) - 2015 Needle Biopsy Liver - 2012 Neuroeltrd Stim Post Tibial - 2019, 2019, 2019, 2019, 2019, 2019, 2019, 2019, 2019, 2019, 2019, 2019 Nose Surgery (Unspecified) Remove Tonsils  And Adenoids - 2012 Repair Septal Defect Suspend Breast - 1987       GU PMH: Splitting of urinary stream (Worsening, Chronic), Repeat dilatation PRN - 2019 Nocturia - 2019 Pelvic/perineal pain - 2019 Interstitial Cystitis (w/o hematuria) - 2019, I will get her set up for a repeat HOD and clorpactin. She is well aware of the risks. 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 that year    NON-GU PMH: Cervicalgia, Neck pain, bilateral - 2017 Radiculopathy, cervical region, Cervical radiculopathy - 2017 Muscle weakness (generalized), Muscle weakness - 2017 Other muscle spasm, Muscle spasm - 2017 Paresthesia of skin, Paresthesia and pain of both upper extremities - 0000000 Follicular disorder, unspecified, Folliculitis - [id number] Abrasion of unspecified hand, initial encounter, Abrasion of hand - 2015 Acute upper respiratory infection, unspecified, Upper respiratory infection with cough and congestion - 2015 Personal history of other diseases of the respiratory system, History of acute bronchitis - 2015 Vitamin D deficiency, unspecified, Vitamin D deficiency - 2015 Encounter for general adult medical examination without abnormal findings, Encounter for preventive health examination - 2015 Anxiety, Anxiety (Symptom) -  2014 Asthma, Asthma - 2014 Cerebral infarction, unspecified, Stroke Syndrome - 2014 Fibromyalgia, Fibromyalgia - 2014 Irritable bowel syndrome with diarrhea, Irritable Bowel Syndrome - 2014 Personal history of other diseases of the circulatory system, History of hypertension - 2014 Personal history of other  diseases of the nervous system and sense organs, History of migraine headaches - 2014 Personal history of other endocrine, nutritional and metabolic disease, History of hypercholesterolemia - 2014 Personal history of other specified conditions, History of fibrocystic disease of breast - 2014 Rash and other nonspecific skin eruption, Rash - 2014 Abnormal results of liver function studies     FAMILY HISTORY: Acute Myocardial Infarction - Mother Bladder Cancer - Father colonic polyps - Father Congestive Heart Failure - Mother Coronary Artery Disease - Mother Death In The Family Father - Runs In Family Death In The Family Mother - Runs In Family Diabetes - Mother, Sister Family Health Status Number - Runs In Family fibromyalgia - Mother Glioblastoma Multiforme (Grade IV) Of The Brain - Father Heart Disease - Mother Malignant Melanoma Of The Skin - Sister Pure Hypercholesterolemia - Father skin cancer - Mother, Father Stroke Syndrome - Mother Ulcer, Gastric - Mother    SOCIAL HISTORY: Marital Status: Married Preferred Language: English; Ethnicity: Not Hispanic Or Latino; Race: White Current Smoking Status: Patient has never smoked.  Does not use smokeless tobacco. Has never drank.  Does not use drugs. Drinks 1 caffeinated drink per day. 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     REVIEW OF SYSTEMS:    GU Review Female:   Patient denies frequent urination, hard to postpone urination, burning /pain with urination, get up at night to urinate, leakage of urine, stream starts and stops, trouble starting your stream, have to strain to urinate, and being pregnant.  Gastrointestinal (Upper):   Patient denies nausea, vomiting, and indigestion/ heartburn.  Gastrointestinal (Lower):   Patient denies diarrhea and constipation.  Constitutional:   Patient denies fever, night sweats, weight loss, and fatigue.  Skin:   Patient denies skin rash/ lesion and itching.  Eyes:   Patient  denies blurred vision and double vision.  Ears/ Nose/ Throat:   Patient denies sore throat and sinus problems.  Hematologic/Lymphatic:   Patient denies swollen glands and easy bruising.  Cardiovascular:   Patient denies leg swelling and chest pains.  Respiratory:   Patient denies cough and shortness of breath.  Endocrine:   Patient denies excessive thirst.  Musculoskeletal:   Patient denies back pain and joint pain.  Neurological:   Patient denies headaches and dizziness.  Psychologic:   Patient denies depression and anxiety.    Ht 5' (1.524 m)   Wt 66.1 kg   BMI 28.46 kg/m      MULTI-SYSTEM PHYSICAL EXAMINATION:    Constitutional: Well-nourished. No physical deformities. Normally developed. Good grooming.  Respiratory: Normal breath sounds. No labored breathing, no use of accessory muscles.   Cardiovascular: Regular rate and rhythm. No murmur, no gallop.       Complexity of Data:  Records Review:   Previous Patient Records  Urine Test Review:   Urinalysis    PROCEDURES:              ASSESSMENT:      ICD-10 Details  1 GU:   Interstitial Cystitis (w/o hematuria) - N30.10 Chronic, Worsening - I will get her scheduled for a cystoscopy and HOD.   2 NON-GU:   Bacteriuria - R82.71 Undiagnosed New Problem - She has a possible UTI  today and I will get the urine cultured.     PLAN:            Schedule Return Visit/Planned Activity: Next Available Appointment - Schedule Surgery            Document Letter(s):  Created for Patient: Clinical Summary                    Electronically signed by [name], MD at 02/27/2021  7:40 PM     Admission (Discharged) on 02/28/2021       Routing History       Detailed Report      Note shared with patient Care Timeline  11/01   CYSTOSCOPY/HYDRODISTENSION INSTILL CLORPACTION    Discharged [id number]

## 2022-06-19 DIAGNOSIS — Z719 Counseling, unspecified: Secondary | ICD-10-CM

## 2022-06-26 ENCOUNTER — Ambulatory Visit (INDEPENDENT_AMBULATORY_CARE_PROVIDER_SITE_OTHER): Payer: PPO | Admitting: Family Medicine

## 2022-06-26 ENCOUNTER — Encounter (INDEPENDENT_AMBULATORY_CARE_PROVIDER_SITE_OTHER): Payer: Self-pay | Admitting: Family Medicine

## 2022-06-26 VITALS — BP 121/74 | HR 60 | Temp 97.7°F | Ht 59.0 in | Wt 145.0 lb

## 2022-06-26 DIAGNOSIS — Z6829 Body mass index (BMI) 29.0-29.9, adult: Secondary | ICD-10-CM

## 2022-06-26 DIAGNOSIS — E669 Obesity, unspecified: Secondary | ICD-10-CM

## 2022-06-26 DIAGNOSIS — R7303 Prediabetes: Secondary | ICD-10-CM | POA: Diagnosis not present

## 2022-06-26 DIAGNOSIS — I10 Essential (primary) hypertension: Secondary | ICD-10-CM

## 2022-06-26 MED ORDER — METFORMIN HCL 500 MG PO TABS: 250.0000 mg | ORAL_TABLET | Freq: Two times a day (BID) | ORAL | 0 refills | Status: DC

## 2022-06-29 ENCOUNTER — Ambulatory Visit (INDEPENDENT_AMBULATORY_CARE_PROVIDER_SITE_OTHER): Payer: PPO | Admitting: Plastic Surgery

## 2022-06-29 ENCOUNTER — Encounter: Payer: Self-pay | Admitting: Plastic Surgery

## 2022-06-29 VITALS — BP 116/77 | HR 66

## 2022-06-29 DIAGNOSIS — L989 Disorder of the skin and subcutaneous tissue, unspecified: Secondary | ICD-10-CM

## 2022-06-29 DIAGNOSIS — N3010 Interstitial cystitis (chronic) without hematuria: Secondary | ICD-10-CM | POA: Diagnosis not present

## 2022-06-29 NOTE — Progress Notes (Signed)
The patient is a 72 year old female here for follow-up after having a lesion removed from her right breast.  The lesion came back benign.  She is healed really well.  The area is nice and soft.  No sign of infection.  She is pleased with the results.  She still has some pain sometimes laterally and we have talked about her using Voltaren for that.  She knows to give Brenda [name] a call if she has any questions or concerns.

## 2022-07-02 DIAGNOSIS — Z6829 Body mass index (BMI) 29.0-29.9, adult: Secondary | ICD-10-CM | POA: Diagnosis not present

## 2022-07-02 DIAGNOSIS — M5416 Radiculopathy, lumbar region: Secondary | ICD-10-CM | POA: Diagnosis not present

## 2022-07-02 DIAGNOSIS — M5136 Other intervertebral disc degeneration, lumbar region: Secondary | ICD-10-CM | POA: Diagnosis not present

## 2022-07-02 DIAGNOSIS — M47812 Spondylosis without myelopathy or radiculopathy, cervical region: Secondary | ICD-10-CM | POA: Diagnosis not present

## 2022-07-02 DIAGNOSIS — Z719 Counseling, unspecified: Secondary | ICD-10-CM

## 2022-07-05 ENCOUNTER — Other Ambulatory Visit: Payer: Self-pay | Admitting: Neurosurgery

## 2022-07-05 DIAGNOSIS — M5416 Radiculopathy, lumbar region: Secondary | ICD-10-CM

## 2022-07-07 ENCOUNTER — Ambulatory Visit
Admission: RE | Admit: 2022-07-07 | Discharge: 2022-07-07 | Disposition: A | Discharge status: Home / Self Care | Payer: PPO | Source: Ambulatory Visit | Attending: Neurosurgery | Admitting: Neurosurgery

## 2022-07-07 DIAGNOSIS — M5416 Radiculopathy, lumbar region: Secondary | ICD-10-CM

## 2022-07-07 DIAGNOSIS — M48061 Spinal stenosis, lumbar region without neurogenic claudication: Secondary | ICD-10-CM | POA: Diagnosis not present

## 2022-07-07 DIAGNOSIS — M545 Low back pain, unspecified: Secondary | ICD-10-CM | POA: Diagnosis not present

## 2022-07-13 DIAGNOSIS — M5416 Radiculopathy, lumbar region: Secondary | ICD-10-CM | POA: Diagnosis not present

## 2022-07-16 DIAGNOSIS — J309 Allergic rhinitis, unspecified: Secondary | ICD-10-CM | POA: Diagnosis not present

## 2022-07-16 DIAGNOSIS — R051 Acute cough: Secondary | ICD-10-CM | POA: Diagnosis not present

## 2022-07-16 DIAGNOSIS — R5383 Other fatigue: Secondary | ICD-10-CM | POA: Diagnosis not present

## 2022-07-16 DIAGNOSIS — J209 Acute bronchitis, unspecified: Secondary | ICD-10-CM | POA: Diagnosis not present

## 2022-07-16 DIAGNOSIS — Z1152 Encounter for screening for COVID-19: Secondary | ICD-10-CM | POA: Diagnosis not present

## 2022-07-16 NOTE — Progress Notes (Signed)
Chief Complaint:   OBESITY Brenda [name] is here to discuss her progress with her obesity treatment plan along with follow-up of her obesity related diagnoses. Brenda [name] is on keeping a food journal and adhering to recommended goals of 1000 calories and 75+ grams of protein and states she is following her eating plan approximately 65% of the time. Brenda [name] states she is doing total gym for 15 minutes 3 times per week.  Today's visit was #: 12 Starting weight: 147 lbs Starting date: 08/03/2021 Today's weight: 145 lbs Today's date: 06/26/2022 Total lbs lost to date: 2 Total lbs lost since last in-office visit: 0  Interim History: Brenda [name] continues to work on meeting her protein goals.  She is trying to increase her exercise at home, but she is frustrated at how difficult weight loss can be.  Subjective:   1. Pre-diabetes Brenda [name] is having some GI upset with metformin.  She is not sure how much benefit she is getting so far.  Her last A1c was 6.1.  2. Essential hypertension Brenda [name]'s blood pressure is well-controlled on her medications.  She denies chest pain or headache.  She is working on her weight loss to decrease the need for antihypertensive medications.  Assessment/Plan:   1. Pre-diabetes Brenda [name] agreed to change metformin to 1/2 tablet by mouth twice daily (pharmacy to cut in half for the patient).  We will recheck labs in 6 weeks.  - metFORMIN (GLUCOPHAGE) 500 MG tablet; Take 0.5 tablets (250 mg total) by mouth 2 (two) times daily with a meal.  Dispense: 30 tablet; Refill: 0  2. Essential hypertension Brenda [name] will continue with her diet and exercise, and we will continue to monitor.  3. BMI 29.0-29.9,adult  4. Obesity, Beginning BMI 30.0 Brenda [name] is currently in the action stage of change. As such, her goal is to continue with weight loss efforts. She has agreed to keeping a food journal and adhering to recommended goals of 1000 calories and 75+ grams of protein daily.   Exercise goals:  As is.   Behavioral modification strategies: increasing lean protein intake.  Brenda [name] has agreed to follow-up with our clinic in 4 weeks. She was informed of the importance of frequent follow-up visits to maximize her success with intensive lifestyle modifications for her multiple health conditions.   Objective:   Blood pressure 121/74, pulse 60, temperature 97.7 F (36.5 C), height 4\' 11"  (1.499 m), weight 145 lb (65.8 kg), SpO2 96 %. Body mass index is 29.29 kg/m.  Lab Results  Component Value Date   CREATININE 1.04 (H) 05/01/2022   BUN 19 05/01/2022   NA 142 05/01/2022   K 4.6 05/01/2022   CL 104 05/01/2022   CO2 23 05/01/2022   Lab Results  Component Value Date   ALT 25 05/01/2022   AST 22 05/01/2022   ALKPHOS 118 05/01/2022   BILITOT 0.4 05/01/2022   Lab Results  Component Value Date   HGBA1C 6.1 (H) 05/01/2022   HGBA1C 6.1 (H) 12/19/2021   HGBA1C 6.1 (H) 08/03/2021   Lab Results  Component Value Date   INSULIN 10.7 05/01/2022   INSULIN 10.4 12/19/2021   INSULIN 4.9 08/03/2021   Lab Results  Component Value Date   TSH 2.170 12/19/2021   Lab Results  Component Value Date   CHOL 164 05/01/2022   HDL 66 05/01/2022   LDLCALC 73 05/01/2022   TRIG 147 05/01/2022   Lab Results  Component Value Date   VD25OH 48.2 05/01/2022   VD25OH 56.8 12/19/2021  VD25OH 90.7 08/03/2021   Lab Results  Component Value Date   WBC 7.0 05/01/2022   HGB 13.6 05/01/2022   HCT 41.0 05/01/2022   MCV 93 05/01/2022   PLT 259 05/01/2022   No results found for: "IRON", "TIBC", "FERRITIN"  Attestation Statements:   Reviewed by clinician on day of visit: allergies, medications, problem list, medical history, surgical history, family history, social history, and previous encounter notes.  Time spent on visit including pre-visit chart review and post-visit care and charting was 30 minutes.   I, Trixie Dredge, am acting as transcriptionist for Dennard Nip, MD.  I have  reviewed the above documentation for accuracy and completeness, and I agree with the above. -  Dennard Nip, MD

## 2022-07-20 DIAGNOSIS — N3010 Interstitial cystitis (chronic) without hematuria: Secondary | ICD-10-CM | POA: Diagnosis not present

## 2022-07-24 ENCOUNTER — Encounter (INDEPENDENT_AMBULATORY_CARE_PROVIDER_SITE_OTHER): Payer: Self-pay | Admitting: Family Medicine

## 2022-07-24 ENCOUNTER — Ambulatory Visit (INDEPENDENT_AMBULATORY_CARE_PROVIDER_SITE_OTHER): Payer: PPO | Admitting: Family Medicine

## 2022-07-24 VITALS — BP 137/80 | HR 57 | Temp 97.9°F | Ht 59.0 in | Wt 142.0 lb

## 2022-07-24 DIAGNOSIS — I10 Essential (primary) hypertension: Secondary | ICD-10-CM | POA: Diagnosis not present

## 2022-07-24 DIAGNOSIS — R7303 Prediabetes: Secondary | ICD-10-CM

## 2022-07-24 DIAGNOSIS — E669 Obesity, unspecified: Secondary | ICD-10-CM

## 2022-07-24 DIAGNOSIS — Z6828 Body mass index (BMI) 28.0-28.9, adult: Secondary | ICD-10-CM

## 2022-07-24 MED ORDER — METFORMIN HCL 500 MG PO TABS: 250.0000 mg | ORAL_TABLET | Freq: Two times a day (BID) | ORAL | 0 refills | Status: DC

## 2022-07-24 NOTE — Progress Notes (Signed)
Chief Complaint:   OBESITY Brenda [name] is here to discuss her progress with her obesity treatment plan along with follow-up of her obesity related diagnoses. Brenda [name] is on keeping a food journal and adhering to recommended goals of 1000 calories and 75+ grams of protein and states she is following her eating plan approximately 50% of the time. [name] states she is doing 0 minutes 0 times per week.  Today's visit was #: 13 Starting weight: 147 lbs Starting date: 08/03/2021 Today's weight: 142 lbs Today's date: 07/24/2022 Total lbs lost to date: 5 Total lbs lost since last in-office visit: 3  Interim History: Brenda [name] has been sick recently with bronchitis, and she was on prednisone which can cause weight gain.  She notes her hunger has decreased which she was sick and she is just now regaining her appetite.  Subjective:   1. Pre-diabetes Brenda [name] is on metformin 1/2 tablet by mouth twice daily.  She is taking it with food.  2. Essential hypertension Brenda [name]'s blood pressure is stable on Cozaar and metoprolol as well as with her diet, exercise, and weight loss.  She denies chest pain, headache, or dizziness.  Assessment/Plan:   1. Pre-diabetes We will refill metformin for 90 days.  Brenda [name] will continue with her diet and exercise.  - metFORMIN (GLUCOPHAGE) 500 MG tablet; Take 0.5 tablets (250 mg total) by mouth 2 (two) times daily with a meal.  Dispense: 90 tablet; Refill: 0  2. Essential hypertension Brenda [name] will continue Cozaar and metoprolol, and her diet.  She will watch for decreased blood pressure with continued weight loss.  3. BMI 28.0-28.9,adult  4. Obesity, Beginning BMI 30.0 Brenda [name] is currently in the action stage of change. As such, her goal is to continue with weight loss efforts. She has agreed to keeping a food journal and adhering to recommended goals of 1000 calories and 75+ grams of protein daily.   Behavioral modification strategies: increasing lean protein intake,  increasing water intake, and meal planning and cooking strategies.  [name] has agreed to follow-up with our clinic in 4 weeks. She was informed of the importance of frequent follow-up visits to maximize her success with intensive lifestyle modifications for her multiple health conditions.   Objective:   Blood pressure 137/80, pulse (!) 57, temperature 97.9 F (36.6 C), height 4\' 11"  (1.499 m), weight 142 lb (64.4 kg), SpO2 97 %. Body mass index is 28.68 kg/m.  Lab Results  Component Value Date   CREATININE 1.04 (H) 05/01/2022   BUN 19 05/01/2022   NA 142 05/01/2022   K 4.6 05/01/2022   CL 104 05/01/2022   CO2 23 05/01/2022   Lab Results  Component Value Date   ALT 25 05/01/2022   AST 22 05/01/2022   ALKPHOS 118 05/01/2022   BILITOT 0.4 05/01/2022   Lab Results  Component Value Date   HGBA1C 6.1 (H) 05/01/2022   HGBA1C 6.1 (H) 12/19/2021   HGBA1C 6.1 (H) 08/03/2021   Lab Results  Component Value Date   INSULIN 10.7 05/01/2022   INSULIN 10.4 12/19/2021   INSULIN 4.9 08/03/2021   Lab Results  Component Value Date   TSH 2.170 12/19/2021   Lab Results  Component Value Date   CHOL 164 05/01/2022   HDL 66 05/01/2022   LDLCALC 73 05/01/2022   TRIG 147 05/01/2022   Lab Results  Component Value Date   VD25OH 48.2 05/01/2022   VD25OH 56.8 12/19/2021   VD25OH 90.7 08/03/2021   Lab Results  Component  Value Date   WBC 7.0 05/01/2022   HGB 13.6 05/01/2022   HCT 41.0 05/01/2022   MCV 93 05/01/2022   PLT 259 05/01/2022   No results found for: "IRON", "TIBC", "FERRITIN"  Attestation Statements:   Reviewed by clinician on day of visit: allergies, medications, problem list, medical history, surgical history, family history, social history, and previous encounter notes.   I, Burt Knack, am acting as transcriptionist for Quillian Quince, MD.  I have reviewed the above documentation for accuracy and completeness, and I agree with the above. -  Quillian Quince,  MD

## 2022-08-13 DIAGNOSIS — M5416 Radiculopathy, lumbar region: Secondary | ICD-10-CM | POA: Diagnosis not present

## 2022-08-13 DIAGNOSIS — M5136 Other intervertebral disc degeneration, lumbar region: Secondary | ICD-10-CM | POA: Diagnosis not present

## 2022-08-13 DIAGNOSIS — M47812 Spondylosis without myelopathy or radiculopathy, cervical region: Secondary | ICD-10-CM | POA: Diagnosis not present

## 2022-08-13 DIAGNOSIS — Z6828 Body mass index (BMI) 28.0-28.9, adult: Secondary | ICD-10-CM | POA: Diagnosis not present

## 2022-08-16 ENCOUNTER — Other Ambulatory Visit: Payer: Self-pay | Admitting: Neurosurgery

## 2022-08-16 DIAGNOSIS — M47812 Spondylosis without myelopathy or radiculopathy, cervical region: Secondary | ICD-10-CM

## 2022-08-17 DIAGNOSIS — N3010 Interstitial cystitis (chronic) without hematuria: Secondary | ICD-10-CM | POA: Diagnosis not present

## 2022-08-18 ENCOUNTER — Ambulatory Visit
Admission: RE | Admit: 2022-08-18 | Discharge: 2022-08-18 | Disposition: A | Discharge status: Home / Self Care | Payer: PPO | Source: Ambulatory Visit | Attending: Neurosurgery | Admitting: Neurosurgery

## 2022-08-18 DIAGNOSIS — M47812 Spondylosis without myelopathy or radiculopathy, cervical region: Secondary | ICD-10-CM

## 2022-08-18 DIAGNOSIS — M4802 Spinal stenosis, cervical region: Secondary | ICD-10-CM | POA: Diagnosis not present

## 2022-08-18 DIAGNOSIS — M542 Cervicalgia: Secondary | ICD-10-CM | POA: Diagnosis not present

## 2022-08-21 ENCOUNTER — Ambulatory Visit (INDEPENDENT_AMBULATORY_CARE_PROVIDER_SITE_OTHER): Payer: PPO | Admitting: Family Medicine

## 2022-08-21 ENCOUNTER — Encounter (INDEPENDENT_AMBULATORY_CARE_PROVIDER_SITE_OTHER): Payer: Self-pay | Admitting: Family Medicine

## 2022-08-21 VITALS — BP 112/74 | HR 59 | Temp 97.8°F | Ht 59.0 in | Wt 141.0 lb

## 2022-08-21 DIAGNOSIS — Z6828 Body mass index (BMI) 28.0-28.9, adult: Secondary | ICD-10-CM

## 2022-08-21 DIAGNOSIS — E782 Mixed hyperlipidemia: Secondary | ICD-10-CM | POA: Diagnosis not present

## 2022-08-21 DIAGNOSIS — E669 Obesity, unspecified: Secondary | ICD-10-CM | POA: Diagnosis not present

## 2022-08-21 DIAGNOSIS — R7303 Prediabetes: Secondary | ICD-10-CM | POA: Diagnosis not present

## 2022-08-21 NOTE — Progress Notes (Signed)
Brenda [name], D.O.  ABFM, ABOM Specializing in Clinical Bariatric Medicine  Office located at: [address] [place], [place]  [address]     Assessment and Plan:   No orders of the defined types were placed in this encounter.   There are no discontinued medications.   No orders of the defined types were placed in this encounter.   Mixed hyperlipidemia Assessment: Condition is stable. Lab Results  Component Value Date   CHOL 164 05/01/2022   HDL 66 05/01/2022   LDLCALC 73 05/01/2022   TRIG 147 05/01/2022  She has been compliant with Zetia 10 mg daily and Repatha 140 mg/ml every 2 weeks. Denies any side effects.  Plan:  Continue with meds as recommended by pcp/specialist.  Continue her prudent nutritional plan that is low in simple carbohydrates, saturated fats and trans fats to goal of 5-10% weight loss to achieve significant health benefits.  Pt encouraged to continually advance exercise and cardiovascular fitness as tolerated throughout weight loss journey.    Pre-diabetes Assessment: Condition is Not optimized. Lab Results  Component Value Date   HGBA1C 6.1 (H) 05/01/2022   HGBA1C 6.1 (H) 12/19/2021   HGBA1C 6.1 (H) 08/03/2021   INSULIN 10.7 05/01/2022   INSULIN 10.4 12/19/2021   INSULIN 4.9 08/03/2021  She is mostly compliant with Metformin 250 mg BID. She endorses that the Metformin sometimes causes her to have loose stools and abdominal pain when eating off plan and not taking it with food. This is likely due to her history of IBS.  Her hunger and cravings are controlled, however patient endorses she must have something sweet with each meal.  Plan: Continue with med as prescribed.  - Continue to decrease simple carbs/ sugars; increase fiber and proteins -> follow her meal plan.   Brenda [name] will continue to work on weight loss, exercise, via their meal plan we devised to help decrease the risk of progressing to diabetes.   TREATMENT PLAN FOR  OBESITY: BMI 28.0-28.9,adult Obesity, Beginning BMI 30.0 Assessment: Condition is improving, but not optimized. Biometric data collected today, was reviewed with patient.  Fat mass has decreased by 0.4lb. Muscle mass has decreased by 0.4lb. Total body water has decreased by 0.6lb.   Plan:  Brenda [name] is currently in the action stage of change. As such, her goal is to continue weight management plan. Brenda [name] will work on healthier eating habits and continue with [employer] and Journaling 1000 calories and 75 grams of protein  I went over the Category 1 meal plan+ Lunch options handout with the patient to give her suggestions on ways that she can increase her daily protein and fiber intake. I emphasized the importance of journaling for mindful eating and increased awareness.   Behavioral Intervention Additional resources provided today: category 1 meal plan information, lunch options, slow cooker meal options handout, and food journaling tracking log  Evidence-based interventions for health behavior change were utilized today including the discussion of self monitoring techniques, problem-solving barriers and SMART goal setting techniques.   Regarding patient's less desirable eating habits and patterns, we employed the technique of small changes.  Pt will specifically work on: journaling more consistently for next visit.    Recommended Physical Activity Goals Brenda [name] has been advised to work up to 150 minutes of moderate intensity aerobic activity a week and strengthening exercises 2-3 times per week for cardiovascular health, weight loss maintenance and preservation of muscle mass.  She has agreed to Continue current level of physical  activity   FOLLOW UP: Return in about 4 weeks (around 09/18/2022). She was informed of the importance of frequent follow up visits to maximize her success with intensive lifestyle modifications for her multiple health conditions.   Subjective:   Chief complaint:  Obesity Brenda [name] is here to discuss her progress with her obesity treatment plan. She has been using the [employer] meal prep company and states she is following her eating plan approximately 80% of the time. She states she is walking 10,000 steps, 2 days a week.   Interval History:  Brenda [name] is here for a follow up office visit. Since last office visit patient endorses that in addition to walking, she is using a total gym machine for 10 minutes, 2 days a week. She endorses that the foods from [employer] are not very appetizing and do not have a lot of protein. She has been occasionally journaling her intake. She reports that it is difficult for her to eat 75+ grams of protein. Usually, she is consuming 30-40 grams of protein daily, sometimes 60 grams.   Pharmacotherapy for weight loss: She is currently taking  Metformin  for medical weight loss.Denies side effects.    Review of Systems:  Pertinent positives were addressed with patient today.  Weight Summary and Biometrics   Weight Lost Since Last Visit: 1 lb  No data recorded   Vitals Temp: 97.8 F (36.6 C) BP: 112/74 Pulse Rate: (!) 59 SpO2: 97 %   Anthropometric Measurements Height: 4\' 11"  (1.499 m) Weight: 141 lb (64 kg) BMI (Calculated): 28.46 Weight at Last Visit: 142 lb Weight Lost Since Last Visit: 1 lb   Body Composition  Body Fat %: 40.6 % Fat Mass (lbs): 57.2 lbs Muscle Mass (lbs): 79.6 lbs Total Body Water (lbs): 57 lbs Visceral Fat Rating : 11   Other Clinical Data Fasting: no Labs: no Today's Visit #: 14     Objective:   PHYSICAL EXAM: Blood pressure 112/74, pulse (!) 59, temperature 97.8 F (36.6 C), height 4\' 11"  (1.499 m), weight 141 lb (64 kg), SpO2 97 %. Body mass index is 28.48 kg/m.  General: Well Developed, well nourished, and in no acute distress.  HEENT: Normocephalic, atraumatic Skin: Warm and dry, cap RF less 2 sec, good turgor Chest:  Normal excursion, shape, no gross  abn Respiratory: speaking in full sentences, no conversational dyspnea NeuroM-Sk: Ambulates w/o assistance, moves * 4 Psych: A and O *3, insight good, mood-full  DIAGNOSTIC DATA REVIEWED:  BMET    Component Value Date/Time   NA 142 05/01/2022 1158   K 4.6 05/01/2022 1158   CL 104 05/01/2022 1158   CO2 23 05/01/2022 1158   GLUCOSE 94 05/01/2022 1158   GLUCOSE 93 02/28/2021 1050   BUN 19 05/01/2022 1158   CREATININE 1.04 (H) 05/01/2022 1158   CALCIUM 10.1 05/01/2022 1158   GFRNONAA >60 12/04/2016 1351   GFRAA >60 12/04/2016 1351   Lab Results  Component Value Date   HGBA1C 6.1 (H) 05/01/2022   HGBA1C 6.1 (H) 08/03/2021   Lab Results  Component Value Date   INSULIN 10.7 05/01/2022   INSULIN 4.9 08/03/2021   Lab Results  Component Value Date   TSH 2.170 12/19/2021   CBC    Component Value Date/Time   WBC 7.0 05/01/2022 1158   WBC 7.7 08/12/2017 1211   RBC 4.40 05/01/2022 1158   RBC 4.34 08/12/2017 1211   HGB 13.6 05/01/2022 1158   HCT 41.0 05/01/2022 1158   PLT  259 05/01/2022 1158   MCV 93 05/01/2022 1158   MCH 30.9 05/01/2022 1158   MCH 31.6 12/04/2016 1351   MCHC 33.2 05/01/2022 1158   MCHC 34.0 08/12/2017 1211   RDW 14.2 05/01/2022 1158   Iron Studies No results found for: "IRON", "TIBC", "FERRITIN", "IRONPCTSAT" Lipid Panel     Component Value Date/Time   CHOL 164 05/01/2022 1158   TRIG 147 05/01/2022 1158   HDL 66 05/01/2022 1158   LDLCALC 73 05/01/2022 1158   Hepatic Function Panel     Component Value Date/Time   PROT 6.8 05/01/2022 1158   ALBUMIN 4.5 05/01/2022 1158   AST 22 05/01/2022 1158   ALT 25 05/01/2022 1158   ALKPHOS 118 05/01/2022 1158   BILITOT 0.4 05/01/2022 1158      Component Value Date/Time   TSH 2.170 12/19/2021 0920   Nutritional Lab Results  Component Value Date   VD25OH 48.2 05/01/2022   VD25OH 56.8 12/19/2021   VD25OH 90.7 08/03/2021    Attestations:   Reviewed by clinician on day of visit: allergies,  medications, problem list, medical history, surgical history, family history, social history, and previous encounter notes.   Patient was in the office today and time spent on visit including pre-visit chart review and post-visit care/coordination of care and electronic medical record documentation was 40 minutes. 50% of the time was in face to face counseling of this patient's medical condition(s) and providing education on treatment options to include the first-line treatment of diet and lifestyle modification.   I,Special Puri,acting as a Neurosurgeon for [employer], DO.,have documented all relevant documentation on the behalf of Thomasene Lot, DO,as directed by  Thomasene Lot, DO while in the presence of Thomasene Lot, DO.   I, Thomasene Lot, DO, have reviewed all documentation for this visit. The documentation on 08/21/22 for the exam, diagnosis, procedures, and orders are all accurate and complete.

## 2022-09-07 DIAGNOSIS — N3010 Interstitial cystitis (chronic) without hematuria: Secondary | ICD-10-CM | POA: Diagnosis not present

## 2022-09-11 DIAGNOSIS — K219 Gastro-esophageal reflux disease without esophagitis: Secondary | ICD-10-CM | POA: Diagnosis not present

## 2022-09-11 DIAGNOSIS — R7301 Impaired fasting glucose: Secondary | ICD-10-CM | POA: Diagnosis not present

## 2022-09-11 DIAGNOSIS — I10 Essential (primary) hypertension: Secondary | ICD-10-CM | POA: Diagnosis not present

## 2022-09-11 DIAGNOSIS — R7989 Other specified abnormal findings of blood chemistry: Secondary | ICD-10-CM | POA: Diagnosis not present

## 2022-09-11 DIAGNOSIS — M5136 Other intervertebral disc degeneration, lumbar region: Secondary | ICD-10-CM | POA: Diagnosis not present

## 2022-09-11 DIAGNOSIS — E559 Vitamin D deficiency, unspecified: Secondary | ICD-10-CM | POA: Diagnosis not present

## 2022-09-11 DIAGNOSIS — Z719 Counseling, unspecified: Secondary | ICD-10-CM

## 2022-09-11 DIAGNOSIS — E785 Hyperlipidemia, unspecified: Secondary | ICD-10-CM | POA: Diagnosis not present

## 2022-09-11 DIAGNOSIS — M47812 Spondylosis without myelopathy or radiculopathy, cervical region: Secondary | ICD-10-CM | POA: Diagnosis not present

## 2022-09-11 DIAGNOSIS — M5416 Radiculopathy, lumbar region: Secondary | ICD-10-CM | POA: Diagnosis not present

## 2022-09-11 LAB — COMPREHENSIVE METABOLIC PANEL
Albumin: 3.6 (ref 3.5–5.0)
Calcium: 9.1 (ref 8.7–10.7)
eGFR: 44.4

## 2022-09-11 LAB — LIPID PANEL
Cholesterol: 128 (ref 0–200)
HDL: 46 (ref 35–70)
LDL Cholesterol: 56
LDl/HDL Ratio: 1.2
Triglycerides: 130 (ref 40–160)

## 2022-09-11 LAB — BASIC METABOLIC PANEL
BUN: 21 (ref 4–21)
CO2: 27 — AB (ref 13–22)
Chloride: 100 (ref 99–108)
Creatinine: 1.2 — AB (ref 0.5–1.1)
Glucose: 97
Potassium: 4.4 mEq/L (ref 3.5–5.1)
Sodium: 136 — AB (ref 137–147)

## 2022-09-11 LAB — HEPATIC FUNCTION PANEL
ALT: 17 U/L (ref 7–35)
AST: 22 (ref 13–35)
Alkaline Phosphatase: 91 (ref 25–125)
Bilirubin, Total: 0.6

## 2022-09-11 LAB — VITAMIN D 25 HYDROXY (VIT D DEFICIENCY, FRACTURES): Vit D, 25-Hydroxy: 33.5

## 2022-09-11 LAB — HEMOGLOBIN A1C: Hemoglobin A1C: 5.9

## 2022-09-11 LAB — LAB REPORT - SCANNED: EGFR (Non-African Amer.): 44.4

## 2022-09-18 ENCOUNTER — Ambulatory Visit (INDEPENDENT_AMBULATORY_CARE_PROVIDER_SITE_OTHER): Payer: PPO | Admitting: Psychiatry

## 2022-09-18 ENCOUNTER — Encounter: Payer: Self-pay | Admitting: Psychiatry

## 2022-09-18 DIAGNOSIS — M8580 Other specified disorders of bone density and structure, unspecified site: Secondary | ICD-10-CM | POA: Diagnosis not present

## 2022-09-18 DIAGNOSIS — R7301 Impaired fasting glucose: Secondary | ICD-10-CM | POA: Diagnosis not present

## 2022-09-18 DIAGNOSIS — F5105 Insomnia due to other mental disorder: Secondary | ICD-10-CM

## 2022-09-18 DIAGNOSIS — Z1331 Encounter for screening for depression: Secondary | ICD-10-CM | POA: Diagnosis not present

## 2022-09-18 DIAGNOSIS — F4001 Agoraphobia with panic disorder: Secondary | ICD-10-CM

## 2022-09-18 DIAGNOSIS — F330 Major depressive disorder, recurrent, mild: Secondary | ICD-10-CM | POA: Diagnosis not present

## 2022-09-18 DIAGNOSIS — I129 Hypertensive chronic kidney disease with stage 1 through stage 4 chronic kidney disease, or unspecified chronic kidney disease: Secondary | ICD-10-CM | POA: Diagnosis not present

## 2022-09-18 DIAGNOSIS — K589 Irritable bowel syndrome without diarrhea: Secondary | ICD-10-CM | POA: Diagnosis not present

## 2022-09-18 DIAGNOSIS — E785 Hyperlipidemia, unspecified: Secondary | ICD-10-CM | POA: Diagnosis not present

## 2022-09-18 DIAGNOSIS — F411 Generalized anxiety disorder: Secondary | ICD-10-CM | POA: Diagnosis not present

## 2022-09-18 DIAGNOSIS — M5030 Other cervical disc degeneration, unspecified cervical region: Secondary | ICD-10-CM | POA: Diagnosis not present

## 2022-09-18 DIAGNOSIS — E669 Obesity, unspecified: Secondary | ICD-10-CM | POA: Diagnosis not present

## 2022-09-18 DIAGNOSIS — F39 Unspecified mood [affective] disorder: Secondary | ICD-10-CM | POA: Diagnosis not present

## 2022-09-18 DIAGNOSIS — Z1339 Encounter for screening examination for other mental health and behavioral disorders: Secondary | ICD-10-CM | POA: Diagnosis not present

## 2022-09-18 DIAGNOSIS — Z0000 Encounter for general adult medical examination without abnormal findings: Secondary | ICD-10-CM | POA: Diagnosis not present

## 2022-09-18 DIAGNOSIS — N1831 Chronic kidney disease, stage 3a: Secondary | ICD-10-CM | POA: Diagnosis not present

## 2022-09-18 MED ORDER — FLUOXETINE HCL 10 MG PO CAPS: 10.0000 mg | ORAL_CAPSULE | Freq: Every day | ORAL | 3 refills | Status: DC

## 2022-09-18 MED ORDER — LORAZEPAM 1 MG PO TABS: ORAL_TABLET | ORAL | 2 refills | Status: DC
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
## 2022-09-21 DIAGNOSIS — M5412 Radiculopathy, cervical region: Secondary | ICD-10-CM | POA: Diagnosis not present

## 2022-09-24 ENCOUNTER — Encounter (INDEPENDENT_AMBULATORY_CARE_PROVIDER_SITE_OTHER): Payer: Self-pay

## 2022-09-25 ENCOUNTER — Ambulatory Visit (INDEPENDENT_AMBULATORY_CARE_PROVIDER_SITE_OTHER): Payer: PPO | Admitting: Family Medicine

## 2022-10-02 ENCOUNTER — Encounter (INDEPENDENT_AMBULATORY_CARE_PROVIDER_SITE_OTHER): Payer: Self-pay | Admitting: Family Medicine

## 2022-10-02 ENCOUNTER — Ambulatory Visit (INDEPENDENT_AMBULATORY_CARE_PROVIDER_SITE_OTHER): Payer: PPO | Admitting: Family Medicine

## 2022-10-02 VITALS — BP 119/68 | HR 66 | Temp 97.6°F | Ht 59.0 in | Wt 143.0 lb

## 2022-10-02 DIAGNOSIS — R7303 Prediabetes: Secondary | ICD-10-CM

## 2022-10-02 DIAGNOSIS — Z6828 Body mass index (BMI) 28.0-28.9, adult: Secondary | ICD-10-CM | POA: Diagnosis not present

## 2022-10-02 DIAGNOSIS — E559 Vitamin D deficiency, unspecified: Secondary | ICD-10-CM

## 2022-10-02 DIAGNOSIS — I10 Essential (primary) hypertension: Secondary | ICD-10-CM

## 2022-10-02 DIAGNOSIS — E669 Obesity, unspecified: Secondary | ICD-10-CM | POA: Diagnosis not present

## 2022-10-02 MED ORDER — METFORMIN HCL 500 MG PO TABS: 250.0000 mg | ORAL_TABLET | Freq: Two times a day (BID) | ORAL | 0 refills | Status: DC

## 2022-10-02 MED ORDER — VITAMIN D3 50 MCG (2000 UT) PO CAPS: 4000.0000 [IU] | ORAL_CAPSULE | Freq: Every day | ORAL | Status: DC

## 2022-10-02 NOTE — Progress Notes (Signed)
Carlye Grippe, D.O.  ABFM, ABOM Specializing in Clinical Bariatric Medicine  Office located at: [address] [place], [place]  [address]     Assessment and Plan:   Medications Discontinued During This Encounter  Medication Reason   metFORMIN (GLUCOPHAGE) 500 MG tablet Reorder     Meds ordered this encounter  Medications   metFORMIN (GLUCOPHAGE) 500 MG tablet    Sig: Take 0.5 tablets (250 mg total) by mouth 2 (two) times daily with a meal.    Dispense:  30 tablet    Refill:  0    Please cut tablet in half for patient.   Cholecalciferol (VITAMIN D3) 50 MCG (2000 UT) capsule    Sig: Take 2 capsules (4,000 Units total) by mouth daily.     Pre-diabetes Assessment: Condition is improving, but not optimized. Labs from PCP on 09/11/2022 reviewed with pt today and all questions were answered about them.   Lab Results  Component Value Date   HGBA1C 6.1 (H) 05/01/2022   HGBA1C 6.1 (H) 12/19/2021   HGBA1C 6.1 (H) 08/03/2021   INSULIN 10.7 05/01/2022   INSULIN 10.4 12/19/2021   INSULIN 4.9 08/03/2021  - Her A1c has improved from 6.1 on 05/01/2022 to 5.9 on 09/11/2022.  - She has been compliant with Metformin 250 mg BID. Reports having occasional loose stools when eating off plan.  - Hunger and cravings are controlled.   Plan: - Continue with med. Will refill this today.    - Continue her prudent nutritional plan that is low in simple carbohydrates, saturated fats and trans fats to goal of 5-10% weight loss to achieve significant health benefits.  Pt encouraged to continually advance exercise and cardiovascular fitness as tolerated throughout weight loss journey.    Essential hypertension Assessment: Condition is stable. Last 3 blood pressure readings in our office are as follows: BP Readings from Last 3 Encounters:  10/02/22 119/68  08/21/22 112/74  07/24/22 137/80  - Today her blood pressure is stable. Denies any side effects.  - No issues with Cozaar 100 mg daily  and  Lopressor 25 mg BID as recommended by specialist. Denies any side effects.  Plan: - Continue with all antihypertensive medications.  - Continue with Prudent nutritional plan and low sodium diet, advance exercise as tolerated.   - We will continue to monitor symptoms as they relate to the her weight loss journey.    Vitamin D deficiency Assessment: Condition is worsening. Labs from PCP on 09/11/22 were reviewed with pt today and all questions were answered about them.    Lab Results  Component Value Date   VD25OH 48.2 05/01/2022   VD25OH 56.8 12/19/2021   VD25OH 90.7 08/03/2021  - Her Vitamin D levels worsened from 48.2 on 05/01/2022 to 33.5 on 09/11/22.  - She has been taking OTC Vitamin D 2,000 UT daily. Denies any side effects.  Plan: - Increase OTC Vitamin D to 2,000 UT BID.  - weight loss will likely improve availability of vitamin D, thus encouraged Rolinda to continue with meal plan and their weight loss efforts to further improve this condition.  Thus, we will need to monitor levels regularly (every 3-4 mo on average) to keep levels within normal limits and prevent over supplementation.   TREATMENT PLAN FOR OBESITY: BMI 28.0-28.9,adult Obesity, Beginning BMI 30.0 Assessment:  NEOSHA SHIMEL is here to discuss her progress with her obesity treatment plan along with follow-up of her obesity related diagnoses. See Medical Weight Management Flowsheet  for complete bioelectrical impedance results.  Condition is not optimized.  Biometric data collected today, was reviewed with patient.   Since last office visit on 08/21/22 patient's  Muscle mass has increased by 0.2 lb. Fat mass has increased by 1.8 lb. Total body water has increased by 1.8 lb.  Counseling done on how various foods will affect these numbers and how to maximize success  Total lbs lost to date: + 1  Total weight loss percentage to date: 0.70   Plan:  -  Continue with journaling 1000 calories and 75 grams of  protein   -  Discussed ways that pt can increase her protein intake: having 4 ounces of protein at lunch, 6 ounces at dinner, greek yogurts, string cheese, etc..   Behavioral Intervention Additional resources provided today:  food journaling log Evidence-based interventions for health behavior change were utilized today including the discussion of self monitoring techniques, problem-solving barriers and SMART goal setting techniques.   Regarding patient's less desirable eating habits and patterns, we employed the technique of small changes.  Pt will specifically work on: focus on eating foods with a 10:1 ratio of calories to protein and gradually increase to doing weight bearing exercises for next visit.    Recommended Physical Activity Goals  Kayra has been advised to slowly work up to 150 minutes of moderate intensity aerobic activity a week and strengthening exercises 2-3 times per week for cardiovascular health, weight loss maintenance and preservation of muscle mass.   She has agreed to Continue current level of physical activity   FOLLOW UP: Return in 4-5 wks. She was informed of the importance of frequent follow up visits to maximize her success with intensive lifestyle modifications for her multiple health conditions.   Subjective:   Chief complaint: Obesity Keryn is here to discuss her progress with her obesity treatment plan. She is keeping a food journal and adhering to recommended goals of 1000 calories and 75+ protein and states she is following her eating plan approximately 60% of the time. She states she is strength training  10 minutes every other day.   Interval History:  SRIHITHA GODARD is here for a follow up office visit.     Since last office visit:   - Pt has been under stress because her husband was ill.  - She has slightly improved her protein consumption since last OV. On 2 occasions, she had 72 grams protein and 83 grams protein.   - Had labs with PCP on  09/11/22: A1c was 5.9 , TRIG: 130, HDL: 46, LDL: 56, CMP: grossly within normal limits, Serum Creatine 1.2, CBC: grossly within normal limits.   Pharmacotherapy for weight loss: She is currently taking  Metformin  for medical weight loss. Reports occasional GI upset when eating off plan.     Review of Systems:  Pertinent positives were addressed with patient today.  Weight Summary and Biometrics   Weight Lost Since Last Visit: 0  Weight Gained Since Last Visit: 1lb    Vitals Temp: 97.6 F (36.4 C) BP: 119/68 Pulse Rate: 66 SpO2: 99 %   Anthropometric Measurements Height: 4\' 11"  (1.499 m) Weight: 143 lb (64.9 kg) BMI (Calculated): 28.87 Weight at Last Visit: 142b Weight Lost Since Last Visit: 0 Weight Gained Since Last Visit: 1lb Starting Weight: 142   Body Composition  Body Fat %: 4.21 % Fat Mass (lbs): 59 lbs Muscle Mass (lbs): 79.8 lbs Total Body Water (lbs): 58.8 lbs Visceral Fat Rating : 11  Other Clinical Data Fasting: no Labs: no Today's Visit #: 15 Starting Date: 08/03/21   Objective:   PHYSICAL EXAM: Blood pressure 119/68, pulse 66, temperature 97.6 F (36.4 C), height 4\' 11"  (1.499 m), weight 143 lb (64.9 kg), SpO2 99 %. Body mass index is 28.88 kg/m.  General: Well Developed, well nourished, and in no acute distress.  HEENT: Normocephalic, atraumatic Skin: Warm and dry, cap RF less 2 sec, good turgor Chest:  Normal excursion, shape, no gross abn Respiratory: speaking in full sentences, no conversational dyspnea NeuroM-Sk: Ambulates w/o assistance, moves * 4 Psych: A and O *3, insight good, mood-full  DIAGNOSTIC DATA REVIEWED:  BMET    Component Value Date/Time   NA 142 05/01/2022 1158   K 4.6 05/01/2022 1158   CL 104 05/01/2022 1158   CO2 23 05/01/2022 1158   GLUCOSE 94 05/01/2022 1158   GLUCOSE 93 02/28/2021 1050   BUN 19 05/01/2022 1158   CREATININE 1.04 (H) 05/01/2022 1158   CALCIUM 10.1 05/01/2022 1158   GFRNONAA >60  12/04/2016 1351   GFRAA >60 12/04/2016 1351   Lab Results  Component Value Date   HGBA1C 6.1 (H) 05/01/2022   HGBA1C 6.1 (H) 08/03/2021   Lab Results  Component Value Date   INSULIN 10.7 05/01/2022   INSULIN 4.9 08/03/2021   Lab Results  Component Value Date   TSH 2.170 12/19/2021   CBC    Component Value Date/Time   WBC 7.0 05/01/2022 1158   WBC 7.7 08/12/2017 1211   RBC 4.40 05/01/2022 1158   RBC 4.34 08/12/2017 1211   HGB 13.6 05/01/2022 1158   HCT 41.0 05/01/2022 1158   PLT 259 05/01/2022 1158   MCV 93 05/01/2022 1158   MCH 30.9 05/01/2022 1158   MCH 31.6 12/04/2016 1351   MCHC 33.2 05/01/2022 1158   MCHC 34.0 08/12/2017 1211   RDW 14.2 05/01/2022 1158   Iron Studies No results found for: "IRON", "TIBC", "FERRITIN", "IRONPCTSAT" Lipid Panel     Component Value Date/Time   CHOL 164 05/01/2022 1158   TRIG 147 05/01/2022 1158   HDL 66 05/01/2022 1158   LDLCALC 73 05/01/2022 1158   Hepatic Function Panel     Component Value Date/Time   PROT 6.8 05/01/2022 1158   ALBUMIN 4.5 05/01/2022 1158   AST 22 05/01/2022 1158   ALT 25 05/01/2022 1158   ALKPHOS 118 05/01/2022 1158   BILITOT 0.4 05/01/2022 1158      Component Value Date/Time   TSH 2.170 12/19/2021 0920   Nutritional Lab Results  Component Value Date   VD25OH 48.2 05/01/2022   VD25OH 56.8 12/19/2021   VD25OH 90.7 08/03/2021    Attestations:   Reviewed by clinician on day of visit: allergies, medications, problem list, medical history, surgical history, family history, social history, and previous encounter notes.    I,Special Puri,acting as a Neurosurgeon for [employer], DO.,have documented all relevant documentation on the behalf of Thomasene Lot, DO,as directed by  Thomasene Lot, DO while in the presence of Thomasene Lot, DO.   I, Thomasene Lot, DO, have reviewed all documentation for this visit. The documentation on 10/02/22 for the exam, diagnosis, procedures, and orders are all  accurate and complete.

## 2022-10-04 DIAGNOSIS — N3010 Interstitial cystitis (chronic) without hematuria: Secondary | ICD-10-CM | POA: Diagnosis not present

## 2022-10-09 NOTE — Progress Notes (Signed)
This encounter was created in error - please disregard.

## 2022-10-15 DIAGNOSIS — Z719 Counseling, unspecified: Secondary | ICD-10-CM

## 2022-10-15 DIAGNOSIS — M5416 Radiculopathy, lumbar region: Secondary | ICD-10-CM | POA: Diagnosis not present

## 2022-10-30 ENCOUNTER — Ambulatory Visit (INDEPENDENT_AMBULATORY_CARE_PROVIDER_SITE_OTHER): Payer: PPO | Admitting: Family Medicine

## 2022-10-30 ENCOUNTER — Other Ambulatory Visit: Payer: Self-pay | Admitting: Internal Medicine

## 2022-10-30 DIAGNOSIS — Z1231 Encounter for screening mammogram for malignant neoplasm of breast: Secondary | ICD-10-CM

## 2022-11-02 DIAGNOSIS — N3010 Interstitial cystitis (chronic) without hematuria: Secondary | ICD-10-CM | POA: Diagnosis not present

## 2022-11-06 ENCOUNTER — Encounter (INDEPENDENT_AMBULATORY_CARE_PROVIDER_SITE_OTHER): Payer: Self-pay | Admitting: Family Medicine

## 2022-11-06 ENCOUNTER — Ambulatory Visit (INDEPENDENT_AMBULATORY_CARE_PROVIDER_SITE_OTHER): Payer: PPO | Admitting: Family Medicine

## 2022-11-06 VITALS — BP 104/64 | HR 60 | Ht 59.0 in | Wt 140.0 lb

## 2022-11-06 DIAGNOSIS — E559 Vitamin D deficiency, unspecified: Secondary | ICD-10-CM | POA: Diagnosis not present

## 2022-11-06 DIAGNOSIS — Z6828 Body mass index (BMI) 28.0-28.9, adult: Secondary | ICD-10-CM

## 2022-11-06 DIAGNOSIS — E669 Obesity, unspecified: Secondary | ICD-10-CM | POA: Diagnosis not present

## 2022-11-06 DIAGNOSIS — R7303 Prediabetes: Secondary | ICD-10-CM | POA: Diagnosis not present

## 2022-11-06 DIAGNOSIS — Z719 Counseling, unspecified: Secondary | ICD-10-CM

## 2022-11-06 MED ORDER — METFORMIN HCL 500 MG PO TABS: 250.0000 mg | ORAL_TABLET | Freq: Two times a day (BID) | ORAL | 0 refills | Status: DC

## 2022-11-06 NOTE — Progress Notes (Unsigned)
Chief Complaint:   OBESITY Brenda [name] is here to discuss her progress with her obesity treatment plan along with follow-up of her obesity related diagnoses. Brenda [name] is on keeping a food journal and adhering to recommended goals of 1000 calories and 75+ grams of protein and states she is following her eating plan approximately 70% of the time. Brenda [name] states she is walking for 10 minutes 3 times per week.  Today's visit was #: 16 Starting weight: 147 lbs Starting date: 08/03/2021 Today's weight: 140 lbs Today's date: 11/06/2022 Total lbs lost to date: 7 Total lbs lost since last in-office visit: 3  Interim History: Patient has done better with her weight loss.  She is working on increasing her protein.  Her hunger is mostly controlled.  Subjective:   1. Pre-diabetes Patient notes some mild GI upset with metformin.  2. Vitamin D deficiency Patient's vitamin D has decreased, and she feels she missed some doses.  Dr. Sharee Holster had increased her vitamin D to 4000 IU daily, but patient has not increased it yet.  Assessment/Plan:   1. Pre-diabetes Patient will continue metformin 500 mg 1/2 tablet twice daily, and we will refill for 1 month.  She will continue with her diet and exercise.  - metFORMIN (GLUCOPHAGE) 500 MG tablet; Take 0.5 tablets (250 mg total) by mouth 2 (two) times daily with a meal.  Dispense: 30 tablet; Refill: 0  2. Vitamin D deficiency Patient will continue vitamin D 12,000 IU once daily OTC, and we will recheck labs in 2 months.  3. BMI 28.0-28.9,adult  4. Obesity, Beginning BMI 30.0 Brenda [name] is currently in the action stage of change. As such, her goal is to continue with weight loss efforts. She has agreed to keeping a food journal and adhering to recommended goals of 1000 calories and 75+ grams of protein daily.   Exercise goals: As is.   Behavioral modification strategies: increasing lean protein intake and decreasing eating out.  Brenda [name] has agreed to follow-up  with our clinic in 4 weeks. She was informed of the importance of frequent follow-up visits to maximize her success with intensive lifestyle modifications for her multiple health conditions.   Objective:   Blood pressure 104/64, pulse 60, height 4\' 11"  (1.499 m), weight 140 lb (63.5 kg), SpO2 96 %. Body mass index is 28.28 kg/m.  Lab Results  Component Value Date   CREATININE 1.2 (A) 09/11/2022   BUN 21 09/11/2022   NA 136 (A) 09/11/2022   K 4.4 09/11/2022   CL 100 09/11/2022   CO2 27 (A) 09/11/2022   Lab Results  Component Value Date   ALT 17 09/11/2022   AST 22 09/11/2022   ALKPHOS 91 09/11/2022   BILITOT 0.4 05/01/2022   Lab Results  Component Value Date   HGBA1C 5.9 09/11/2022   HGBA1C 6.1 (H) 05/01/2022   HGBA1C 6.1 (H) 12/19/2021   HGBA1C 6.1 (H) 08/03/2021   Lab Results  Component Value Date   INSULIN 10.7 05/01/2022   INSULIN 10.4 12/19/2021   INSULIN 4.9 08/03/2021   Lab Results  Component Value Date   TSH 2.170 12/19/2021   Lab Results  Component Value Date   CHOL 128 09/11/2022   HDL 46 09/11/2022   LDLCALC 56 09/11/2022   TRIG 130 09/11/2022   Lab Results  Component Value Date   VD25OH 33.5 09/11/2022   VD25OH 48.2 05/01/2022   VD25OH 56.8 12/19/2021   Lab Results  Component Value Date   WBC 7.0 05/01/2022  HGB 13.6 05/01/2022   HCT 41.0 05/01/2022   MCV 93 05/01/2022   PLT 259 05/01/2022   No results found for: "IRON", "TIBC", "FERRITIN"  Attestation Statements:   Reviewed by clinician on day of visit: allergies, medications, problem list, medical history, surgical history, family history, social history, and previous encounter notes.   I, Burt Knack, am acting as transcriptionist for Quillian Quince, MD.  I have reviewed the above documentation for accuracy and completeness, and I agree with the above. -  Quillian Quince, MD

## 2022-11-23 DIAGNOSIS — N3010 Interstitial cystitis (chronic) without hematuria: Secondary | ICD-10-CM | POA: Diagnosis not present

## 2022-12-03 ENCOUNTER — Ambulatory Visit: Admission: RE | Admit: 2022-12-03 | Payer: PPO | Source: Ambulatory Visit

## 2022-12-03 DIAGNOSIS — Z1231 Encounter for screening mammogram for malignant neoplasm of breast: Secondary | ICD-10-CM

## 2022-12-07 DIAGNOSIS — Z6827 Body mass index (BMI) 27.0-27.9, adult: Secondary | ICD-10-CM | POA: Diagnosis not present

## 2022-12-07 DIAGNOSIS — Z8673 Personal history of transient ischemic attack (TIA), and cerebral infarction without residual deficits: Secondary | ICD-10-CM | POA: Diagnosis not present

## 2022-12-11 ENCOUNTER — Ambulatory Visit (INDEPENDENT_AMBULATORY_CARE_PROVIDER_SITE_OTHER): Payer: PPO | Admitting: Family Medicine

## 2022-12-11 ENCOUNTER — Encounter (INDEPENDENT_AMBULATORY_CARE_PROVIDER_SITE_OTHER): Payer: Self-pay | Admitting: Family Medicine

## 2022-12-11 VITALS — BP 128/71 | HR 57 | Ht 59.0 in | Wt 138.0 lb

## 2022-12-11 DIAGNOSIS — E669 Obesity, unspecified: Secondary | ICD-10-CM | POA: Diagnosis not present

## 2022-12-11 DIAGNOSIS — M5412 Radiculopathy, cervical region: Secondary | ICD-10-CM | POA: Diagnosis not present

## 2022-12-11 DIAGNOSIS — R7303 Prediabetes: Secondary | ICD-10-CM | POA: Diagnosis not present

## 2022-12-11 DIAGNOSIS — E559 Vitamin D deficiency, unspecified: Secondary | ICD-10-CM | POA: Diagnosis not present

## 2022-12-11 DIAGNOSIS — Z6827 Body mass index (BMI) 27.0-27.9, adult: Secondary | ICD-10-CM

## 2022-12-11 DIAGNOSIS — Z6828 Body mass index (BMI) 28.0-28.9, adult: Secondary | ICD-10-CM

## 2022-12-11 MED ORDER — METFORMIN HCL 500 MG PO TABS: 250.0000 mg | ORAL_TABLET | Freq: Two times a day (BID) | ORAL | 0 refills | Status: DC

## 2022-12-12 NOTE — Progress Notes (Unsigned)
Chief Complaint:   OBESITY Brenda [name] is here to discuss her progress with her obesity treatment plan along with follow-up of her obesity related diagnoses. Brenda [name] is on keeping a food journal and adhering to recommended goals of 1000 calories and 75+ grams of protein and states she is following her eating plan approximately 60% of the time. Brenda [name] states she is walking with ankle weights 2-3 times per week.    Today's visit was #: 17 Starting weight: 147 lbs Starting date: 08/03/2021 Today's weight: 138 lbs Today's date: 12/11/2022 Total lbs lost to date: 9 Total lbs lost since last in-office visit: 2  Interim History: Patient has done well with her weight loss over the last month.  She has had some stressors in her family which made meal planning more difficult.  Subjective:   1. Vitamin D deficiency Patient's last vitamin D level dropped below goal.  She is on vitamin D.  2. Pre-diabetes Patient's A1c is improving with her diet, exercise, and metformin.  Assessment/Plan:   1. Vitamin D deficiency We will check labs today, and we will follow-up at patient's next visit.  - VITAMIN D 25 Hydroxy (Vit-D Deficiency, Fractures)  2. Pre-diabetes We will check labs today.  Patient agreed to decrease metformin to 1/2 tablet once daily, and we will refill for 1 month.  - metFORMIN (GLUCOPHAGE) 500 MG tablet; Take 0.5 tablets (250 mg total) by mouth 2 (two) times daily with a meal.  Dispense: 30 tablet; Refill: 0 - CMP14+EGFR - Insulin, random - Hemoglobin A1c - Vitamin B12  3. BMI 28.0-28.9,adult  4. Obesity, Beginning BMI 30.0 Brenda [name] is currently in the action stage of change. As such, her goal is to continue with weight loss efforts. She has agreed to keeping a food journal and adhering to recommended goals of 1000 calories and 75+ grams of protein daily.   Exercise goals: As is.   Behavioral modification strategies: increasing lean protein intake.  Brenda [name] has agreed to  follow-up with our clinic in 2 weeks. She was informed of the importance of frequent follow-up visits to maximize her success with intensive lifestyle modifications for her multiple health conditions.   Brenda [name] was informed we would discuss her lab results at her next visit unless there is a critical issue that needs to be addressed sooner. Brenda [name] agreed to keep her next visit at the agreed upon time to discuss these results.  Objective:   Blood pressure 128/71, pulse (!) 57, height 4\' 11"  (1.499 m), weight 138 lb (62.6 kg), SpO2 98%. Body mass index is 27.87 kg/m.  General: Cooperative, alert, well developed, in no acute distress. HEENT: Conjunctivae and lids unremarkable. Cardiovascular: Regular rhythm.  Lungs: Normal work of breathing. Neurologic: No focal deficits.   Lab Results  Component Value Date   CREATININE 1.05 (H) 12/11/2022   BUN 18 12/11/2022   NA 141 12/11/2022   K 4.2 12/11/2022   CL 101 12/11/2022   CO2 23 12/11/2022   Lab Results  Component Value Date   ALT 22 12/11/2022   AST 26 12/11/2022   ALKPHOS 98 12/11/2022   BILITOT 0.4 12/11/2022   Lab Results  Component Value Date   HGBA1C 6.1 (H) 12/11/2022   HGBA1C 5.9 09/11/2022   HGBA1C 6.1 (H) 05/01/2022   HGBA1C 6.1 (H) 12/19/2021   HGBA1C 6.1 (H) 08/03/2021   Lab Results  Component Value Date   INSULIN WILL FOLLOW 12/11/2022   INSULIN 10.7 05/01/2022   INSULIN 10.4 12/19/2021  INSULIN 4.9 08/03/2021   Lab Results  Component Value Date   TSH 2.170 12/19/2021   Lab Results  Component Value Date   CHOL 128 09/11/2022   HDL 46 09/11/2022   LDLCALC 56 09/11/2022   TRIG 130 09/11/2022   Lab Results  Component Value Date   VD25OH 72.2 12/11/2022   VD25OH 33.5 09/11/2022   VD25OH 48.2 05/01/2022   Lab Results  Component Value Date   WBC 7.0 05/01/2022   HGB 13.6 05/01/2022   HCT 41.0 05/01/2022   MCV 93 05/01/2022   PLT 259 05/01/2022   No results found for: "IRON", "TIBC",  "FERRITIN"  Attestation Statements:   Reviewed by clinician on day of visit: allergies, medications, problem list, medical history, surgical history, family history, social history, and previous encounter notes.   I, Burt Knack, am acting as transcriptionist for Quillian Quince, MD.  I have reviewed the above documentation for accuracy and completeness, and I agree with the above. -  Quillian Quince, MD

## 2022-12-16 DIAGNOSIS — S8011XA Contusion of right lower leg, initial encounter: Secondary | ICD-10-CM | POA: Diagnosis not present

## 2022-12-16 DIAGNOSIS — S93491A Sprain of other ligament of right ankle, initial encounter: Secondary | ICD-10-CM | POA: Diagnosis not present

## 2022-12-18 DIAGNOSIS — G8929 Other chronic pain: Secondary | ICD-10-CM | POA: Diagnosis not present

## 2022-12-18 DIAGNOSIS — Z8673 Personal history of transient ischemic attack (TIA), and cerebral infarction without residual deficits: Secondary | ICD-10-CM | POA: Diagnosis not present

## 2022-12-18 DIAGNOSIS — M542 Cervicalgia: Secondary | ICD-10-CM | POA: Diagnosis not present

## 2022-12-18 DIAGNOSIS — R519 Headache, unspecified: Secondary | ICD-10-CM | POA: Diagnosis not present

## 2022-12-21 ENCOUNTER — Ambulatory Visit: Payer: PPO | Admitting: Physician Assistant

## 2022-12-21 ENCOUNTER — Ambulatory Visit (HOSPITAL_COMMUNITY)
Admission: RE | Admit: 2022-12-21 | Discharge: 2022-12-21 | Disposition: A | Discharge status: Home / Self Care | Payer: PPO | Source: Ambulatory Visit | Attending: Physician Assistant | Admitting: Physician Assistant

## 2022-12-21 ENCOUNTER — Ambulatory Visit (INDEPENDENT_AMBULATORY_CARE_PROVIDER_SITE_OTHER): Payer: PPO

## 2022-12-21 ENCOUNTER — Encounter: Payer: Self-pay | Admitting: Physician Assistant

## 2022-12-21 DIAGNOSIS — R110 Nausea: Secondary | ICD-10-CM | POA: Diagnosis not present

## 2022-12-21 DIAGNOSIS — M79661 Pain in right lower leg: Secondary | ICD-10-CM | POA: Diagnosis not present

## 2022-12-21 DIAGNOSIS — M25571 Pain in right ankle and joints of right foot: Secondary | ICD-10-CM

## 2022-12-21 DIAGNOSIS — N3010 Interstitial cystitis (chronic) without hematuria: Secondary | ICD-10-CM | POA: Diagnosis not present

## 2022-12-21 NOTE — Progress Notes (Signed)
Lower extremity venous duplex completed. Please see CV Procedures for preliminary results.  Shona Simpson, RVT 12/21/22 3:00 PM

## 2022-12-21 NOTE — Progress Notes (Signed)
Office Visit Note   Patient: Brenda [name]           Date of Birth: November 02, 1950           MRN: [id number] Visit Date: 12/21/2022              Requested by: Chilton Greathouse, MD [address] [place],  [place] [address] PCP: Chilton Greathouse, MD  Chief Complaint  Patient presents with   Right Leg - Pain   Right Ankle - Pain      HPI: Brenda [name] is a pleasant 72 year old woman with a 5-day history of right ankle and leg pain after fall while helping carrying something with her husband.  She had direct contact with the object in the back of her leg.  She did go to an urgent care and lower leg x-rays were negative.  She is concerned because she has calf pain and "lumpiness "and is concerned about a DVT.  She has had some strokes but is not on any anticoagulants.  She is a nurse  Assessment & Plan: Visit Diagnoses:  1. Pain in right ankle and joints of right foot     Plan: Did get ankle x-rays today she has some degenerative changes but I could not appreciate any acute fractures.  She is in a cam walker boot I asked that she stay on this.  Should come out to do ankle pumps and alphabet exercises.  Because she is having calf pain and spasm we will rule out a DVT with ultrasound.  Follow-Up Instructions: 2 weeks  Ortho Exam  Patient is alert, oriented, no adenopathy, well-dressed, normal affect, normal respiratory effort. Left leg she has no significant ecchymosis or cellulitis compartments are soft and compressible though she has resolving ecchymosis on the medial side of the calf and does have some tenderness to deep palpation.  Denna Haggard' sign slightly positive.  Does have no pain at the knee no pain in the proximal fibula has good dorsiflexion plantarflexion eversion inversion  Imaging: XR Ankle Complete Right  Result Date: 12/21/2022 Three-view radiographs of her right ankle demonstrate degenerative changes and bossing at the talonavicular joint no acute changes.  No fractures noted  she does have some degenerative changes of the talar navicular and tibiotalar joint.  No images are attached to the encounter.  Labs: Lab Results  Component Value Date   HGBA1C 6.1 (H) 12/11/2022   HGBA1C 5.9 09/11/2022   HGBA1C 6.1 (H) 05/01/2022   ESRSEDRATE 17 09/13/2016   CRP 0.2 (L) 09/13/2016   REPTSTATUS 08/25/2016 FINAL 08/23/2016   GRAMSTAIN Abundant 09/13/2016   GRAMSTAIN WBC present-both PMN and Mononuclear 09/13/2016   GRAMSTAIN Few Squamous Epithelial Cells Present 09/13/2016   GRAMSTAIN Moderate Gram Positive Cocci In Pairs 09/13/2016   GRAMSTAIN  09/13/2016    Few Gram Positive Rods This specimen is acceptable for Sputum Culture   CULT (A) 08/23/2016    20,000 COLONIES/mL Consistent with normal respiratory flora. Performed at [facility], [address] [place], [place] [address]    LABORGA Normal Oropharyngeal Flora 09/13/2016     Lab Results  Component Value Date   ALBUMIN 4.4 12/11/2022   ALBUMIN 3.6 09/11/2022   ALBUMIN 4.5 05/01/2022    No results found for: "MG" Lab Results  Component Value Date   VD25OH 72.2 12/11/2022   VD25OH 33.5 09/11/2022   VD25OH 48.2 05/01/2022    No results found for: "PREALBUMIN"    Latest Ref Rng & Units 05/01/2022  11:58 AM 02/28/2021   10:50 AM 02/17/2020   10:26 AM  CBC EXTENDED  WBC 3.4 - 10.8 x10E3/uL 7.0     RBC 3.77 - 5.28 x10E6/uL 4.40     Hemoglobin 11.1 - 15.9 g/dL 16.1  09.6  04.5   HCT 34.0 - 46.6 % 41.0  45.0  45.0   Platelets 150 - 450 x10E3/uL 259     NEUT# 1.4 - 7.0 x10E3/uL 4.5     Lymph# 0.7 - 3.1 x10E3/uL 1.8        There is no height or weight on file to calculate BMI.  Orders:  Orders Placed This Encounter  Procedures   XR Ankle Complete Right   No orders of the defined types were placed in this encounter.    Procedures: No procedures performed  Clinical Data: No additional findings.  ROS:  All other systems negative, except as noted in the HPI. Review of  Systems  Objective: Vital Signs: There were no vitals taken for this visit.  Specialty Comments:  No specialty comments available.  PMFS History: Patient Active Problem List   Diagnosis Date Noted   Insulin resistance 05/29/2022   BMI 28.0-28.9,adult 05/29/2022   Obesity, Beginning BMI 29.69 05/29/2022   Lesion of breast 04/24/2022   Essential hypertension 02/19/2022   SOBOE (shortness of breath on exertion) 01/22/2022   Class 1 obesity with serious comorbidity and body mass index (BMI) of 30.0 to 30.9 in adult 01/22/2022   Stage 3 chronic kidney disease (HCC) 08/17/2021   Transaminitis 08/17/2021   Vitamin D deficiency 08/17/2021   Encounter for counseling 06/19/2021   Changing skin lesion 11/11/2020   Aortic atherosclerosis (HCC) 11/10/2020   Pre-diabetes 11/10/2020   Mixed hyperlipidemia 11/10/2020   Pain in right shoulder 04/20/2020   Status post bilateral breast reduction 11/07/2018   Back pain 06/13/2018   Neck pain 06/13/2018   Symptomatic mammary hypertrophy 06/13/2018   GAD (generalized anxiety disorder) 03/13/2018   Panic disorder with agoraphobia 03/13/2018   Mild depression 03/13/2018   Upper airway cough syndrome 08/12/2017   Unilateral primary osteoarthritis, left knee 05/20/2017   Persistent cough    Moderate persistent asthma 03/19/2016   Chronic seasonal allergic rhinitis 03/19/2016   GERD (gastroesophageal reflux disease) 03/19/2016   Cough 03/19/2016   Essential hypertension, benign 03/19/2016   H/O: CVA (cerebrovascular accident) 03/19/2016   IBS (irritable bowel syndrome) 03/19/2016   Interstitial cystitis 03/19/2016   Fibromyalgia 03/19/2016   DJD (degenerative joint disease) 03/19/2016   Arthritis 03/19/2016   Sphincter of Oddi dysfunction 03/19/2016   H/O acute pancreatitis 03/19/2016   Past Medical History:  Diagnosis Date   Bilateral carotid bruits    followed by cardiologist   Bladder pain    Cervical disc disease    Chronic cough     followed by dr wert and ENT dr Harriette Ohara   Chronic kidney disease (CKD), stage III (moderate) (HCC)    Chronic seasonal allergic rhinitis    Dyspnea    with exertion    Fibromyalgia    Frequency of urination    GAD (generalized anxiety disorder)    GERD (gastroesophageal reflux disease)    Heart murmur    cardiologist--- dr croitoru   History of pancreatitis 1988-1989   History of panic attacks    History of stroke 1996   mild without residuals;  per MRI imaging 2010 left thalamic infarct without residual   Hyperlipidemia, mixed    Interstitial cystitis    urologist-- dr  wrenn   Irritable bowel syndrome with both constipation and diarrhea    followed by dr Leone Payor   Labile hypertension    followed by pcp;   hx normal nuclear stress test in 2009;   coronary CT 11-23-2020  normal coronaies, calcium score zero   Lumbar spondylosis    MDD (major depressive disorder)    Migraines    Moderate persistent asthma    followed by pcp and dr wert    Multiple pulmonary nodules    pulmonologist-- dr wert   Osteoarthritis    Palpitations    Panic disorder with agoraphobia    PONV (postoperative nausea and vomiting)    Pre-diabetes    Spasm of sphincter of Oddi 1987   02-13-2019  per pt no issues since she watches what medication's she takes   Urethral stenosis    s/p multiple dilatations   Urgency of urination    Vitamin D deficiency    Wears glasses     Family History  Problem Relation Age of Onset   Allergies Mother    Asthma Mother    Heart disease Mother    Osteoarthritis Mother    Diabetes Mother    Irritable bowel syndrome Mother    High blood pressure Mother    Stroke Mother    Obesity Mother    Brain cancer Father    Osteoarthritis Father    Colon polyps Father        adenomatous   Heart disease Father    Anxiety disorder Father    High Cholesterol Father    Melanoma Sister    Osteoarthritis Sister    Irritable bowel syndrome Sister    Diabetes Sister     Heart disease Sister    Stroke Sister    Colon cancer Maternal Grandmother    Ulcerative colitis Maternal Uncle    Colon cancer Cousin    Stomach cancer Neg Hx    Rheumatologic disease Neg Hx    Esophageal cancer Neg Hx    Pancreatic cancer Neg Hx     Past Surgical History:  Procedure Laterality Date   ABDOMINAL HYSTERECTOMY  1988   COMPLETE   APPENDECTOMY  07/1984   bilateral reduction mastopexy  1988   bilateral turbinate resection  1990   nasal spreading graft   BREAST CYST EXCISION Bilateral 03/18/2019   Procedure: excision necrotic breast tissue left ;  Surgeon: Peggye Form, DO;  Location: [facility];  Service: Plastics;  Laterality: Bilateral;   BREAST CYST EXCISION Bilateral 06/30/2020   Procedure: Excision of right breast fat necrosis and left inferior breast;  Surgeon: Peggye Form, DO;  Location: [facility];  Service: Plastics;  Laterality: Bilateral;   BREAST REDUCTION SURGERY  10/17/2018   Procedure: bilateral breast reduction;  Surgeon: Peggye Form, DO;  Location: [facility];  Service: Plastics;;  please adjust time to reflect 4 hours   CATARACT EXTRACTION W/ INTRAOCULAR LENS  IMPLANT, BILATERAL  08 and 09/ 2017   CHOLECYSTECTOMY  07/1984   COLONOSCOPY  02/2004, 11/2010, LAST 03-2016 ENDO DONE ALSO   2005: Normal - Dr. Sherin Quarry 2012: Normal   CYSTO WITH HYDRODISTENSION N/A 12/17/2013   Procedure: CYSTO HYDRODISTENSION WITH INSTALLATION OF CHLOROPACTIN AND TETRACAINE;  Surgeon: Anner Crete, MD;  Location: [facility];  Service: Urology;  Laterality: N/A;   CYSTO WITH HYDRODISTENSION N/A 06/02/2015   Procedure: CYSTOSCOPY/HYDRODISTENSION WITH INSTILLATION OF CLORPACTIN ;  Surgeon: Bjorn Pippin, MD;  Location: [facility];  Service: Urology;  Laterality: N/A;   CYSTO WITH HYDRODISTENSION N/A 11/08/2016   Procedure: CYSTOSCOPY/HYDRODISTENSION INSTILLATION OF CLORPACTIN;   Surgeon: Bjorn Pippin, MD;  Location: [facility];  Service: Urology;  Laterality: N/A;   CYSTO WITH HYDRODISTENSION N/A 03/06/2018   Procedure: CYSTOSCOPY/HYDRODISTENSION INSTILL CHLORPACTION;  Surgeon: Bjorn Pippin, MD;  Location: [facility];  Service: Urology;  Laterality: N/A;   CYSTO WITH HYDRODISTENSION N/A 04/02/2019   Procedure: CYSTOSCOPY/HYDRODISTENSION INSTILL CHLORPACTIN;  Surgeon: Bjorn Pippin, MD;  Location: [facility];  Service: Urology;  Laterality: N/A;   CYSTO WITH HYDRODISTENSION N/A 02/17/2020   Procedure: CYSTOSCOPY/HYDRODISTENTION WITH INSTILLATION OF CHLORPACTIN;  Surgeon: Bjorn Pippin, MD;  Location: [facility];  Service: Urology;  Laterality: N/A;  ONLY NEEDS 30 MIN   CYSTO WITH HYDRODISTENSION N/A 02/28/2021   Procedure: CYSTOSCOPY/HYDRODISTENSION INSTILL CLORPACTION;  Surgeon: Bjorn Pippin, MD;  Location: [facility];  Service: Urology;  Laterality: N/A;   CYSTO WITH HYDRODISTENSION N/A 05/04/2022   Procedure: CYSTOSCOPY/HYDRODISTENSION INSTILL CLORPACTIN AND TETRACAINE;  Surgeon: Bjorn Pippin, MD;  Location: [facility];  Service: Urology;  Laterality: N/A;   CYSTOSCOPY WITH URETHRAL DILATATION  07/12/2011   Procedure: CYSTOSCOPY WITH URETHRAL DILATATION;  Surgeon: Anner Crete, MD;  Location: [facility];  Service: Urology;  Laterality: N/A;  HOD AND INSTILLATION OF CHLOROPACTIN C-ARM    CYSTOSCOPY WITH URETHRAL DILATATION N/A 06/26/2012   Procedure: CYSTOSCOPY WITH URETHRAL DILATATION HYDRODISTENSION AND INSTILLATION OF CLORPACTION ;  Surgeon: Anner Crete, MD;  Location: [facility];  Service: Urology;  Laterality: N/A;  CYSTOSCOPY WITH URETHRAL DILATATION HYDRODISTENSION AND INSTILLATION OF CLORPACTION    CYSTOSTOMY W/ BLADDER BIOPSY  1983   DG BARIUM SWALLOW ([facility] HX)  03/2016   DILATION AND CURETTAGE OF UTERUS  1987   ERCP  1988   for pancreatitis  with stents   S/P SEVERE PANCREATITIS   HEMORRHOID SURGERY     LASIK Bilateral 07/2003   LIPOSUCTION Bilateral 10/17/2018   Procedure: LIPOSUCTION;  Surgeon: Peggye Form, DO;  Location: [facility];  Service: Plastics;  Laterality: Bilateral;   LIPOSUCTION Right 03/18/2019   Procedure: excision right necrotic breast tissue with liposuction;  Surgeon: Peggye Form, DO;  Location: [facility];  Service: Plastics;  Laterality: Right;   LIPOSUCTION Bilateral 06/30/2020   Procedure: Bilateral liposuction of lateral breast;  Surgeon: Peggye Form, DO;  Location: [facility];  Service: Plastics;  Laterality: Bilateral;   MULTIPLE CYSTO/ HOD/ URETHRAL DILATION/ INSTILLATION CLORPACTIN  .LAST ONE 03-31-2010   NASAL SINUS SURGERY  1977   sinus cyst   REDUCTION MAMMAPLASTY Bilateral 1988   REDUCTION MAMMAPLASTY Bilateral 09/2018   REDUCTION MAMMAPLASTY Bilateral 03/2019   revision rhinoplasty  1978   RHINOPLASTY  1977   RIGHT SALPINGOOPHECTOMY  1998   TONSILLECTOMY AND ADENOIDECTOMY  1963   VIDEO BRONCHOSCOPY Bilateral 08/23/2016   Procedure: VIDEO BRONCHOSCOPY WITHOUT FLUORO;  Surgeon: Roslynn Amble, MD;  Location: [facility];  Service: Cardiopulmonary;  Laterality: Bilateral;   Social History   Occupational History   Occupation: Charity fundraiser  Tobacco Use   Smoking status: Never   Smokeless tobacco: Never  Vaping Use   Vaping status: Never Used  Substance and Sexual Activity   Alcohol use: No   Drug use: No   Sexual activity: Not on file

## 2022-12-24 DIAGNOSIS — M5412 Radiculopathy, cervical region: Secondary | ICD-10-CM | POA: Diagnosis not present

## 2022-12-25 DIAGNOSIS — Z8673 Personal history of transient ischemic attack (TIA), and cerebral infarction without residual deficits: Secondary | ICD-10-CM | POA: Diagnosis not present

## 2022-12-25 DIAGNOSIS — Z6827 Body mass index (BMI) 27.0-27.9, adult: Secondary | ICD-10-CM | POA: Diagnosis not present

## 2023-01-07 ENCOUNTER — Encounter: Payer: Self-pay | Admitting: Physician Assistant

## 2023-01-07 ENCOUNTER — Ambulatory Visit: Payer: PPO | Admitting: Physician Assistant

## 2023-01-07 DIAGNOSIS — M79661 Pain in right lower leg: Secondary | ICD-10-CM | POA: Diagnosis not present

## 2023-01-07 NOTE — Progress Notes (Signed)
Office Visit Note   Patient: Brenda [name]           Date of Birth: [birth year]           MRN: [id number] Visit Date: 01/07/2023              Requested by: Chilton Greathouse, MD [address] [place],  [place] [address] PCP: Chilton Greathouse, MD  No chief complaint on file.     HPI: Patient is a pleasant 72 year old woman who has now a month-long history of right calf pain and tenderness.  She has a history of a swing falling in her leg flexing at the swing falling against her leg.  Since then she has had difficulty ambulating.  She denies any fever or chills has not had any skin breakdown but does report her pain is moderate.  She did have an ultrasound at her last visit to rule out DVT which she did.  X-rays did not show any fractures.  She comes in with an antalgic gait without any improvement  Assessment & Plan: Visit Diagnoses: Right lower leg pain  Plan: This is now been going on for a month would like her to get an MRI to rule out a significant muscle tear.  I will call her with the results.  Follow-Up Instructions: Will call with MRI results  Ortho Exam  Patient is alert, oriented, no adenopathy, well-dressed, normal affect, normal respiratory effort. Examination of her right leg mild soft tissue swelling she does have some resolving bruising in the calf compartments are soft and compressible she does have some palpable soft tissue masses.  No cellulitis neurovascularly intact no pain in her knee  Imaging: No results found. No images are attached to the encounter.  Labs: Lab Results  Component Value Date   HGBA1C 6.1 (H) 12/11/2022   HGBA1C 5.9 09/11/2022   HGBA1C 6.1 (H) 05/01/2022   ESRSEDRATE 17 09/13/2016   CRP 0.2 (L) 09/13/2016   REPTSTATUS 08/25/2016 FINAL 08/23/2016   GRAMSTAIN Abundant 09/13/2016   GRAMSTAIN WBC present-both PMN and Mononuclear 09/13/2016   GRAMSTAIN Few Squamous Epithelial Cells Present 09/13/2016   GRAMSTAIN Moderate Gram Positive  Cocci In Pairs 09/13/2016   GRAMSTAIN  09/13/2016    Few Gram Positive Rods This specimen is acceptable for Sputum Culture   CULT (A) 08/23/2016    20,000 COLONIES/mL Consistent with normal respiratory flora. Performed at [facility], [address] [place], [place] [address]    LABORGA Normal Oropharyngeal Flora 09/13/2016     Lab Results  Component Value Date   ALBUMIN 4.4 12/11/2022   ALBUMIN 3.6 09/11/2022   ALBUMIN 4.5 05/01/2022    No results found for: "MG" Lab Results  Component Value Date   VD25OH 72.2 12/11/2022   VD25OH 33.5 09/11/2022   VD25OH 48.2 05/01/2022    No results found for: "PREALBUMIN"    Latest Ref Rng & Units 05/01/2022   11:58 AM 02/28/2021   10:50 AM 02/17/2020   10:26 AM  CBC EXTENDED  WBC 3.4 - 10.8 x10E3/uL 7.0     RBC 3.77 - 5.28 x10E6/uL 4.40     Hemoglobin 11.1 - 15.9 g/dL 82.9  56.2  13.0   HCT 34.0 - 46.6 % 41.0  45.0  45.0   Platelets 150 - 450 x10E3/uL 259     NEUT# 1.4 - 7.0 x10E3/uL 4.5     Lymph# 0.7 - 3.1 x10E3/uL 1.8        There is no  height or weight on file to calculate BMI.  Orders:  No orders of the defined types were placed in this encounter.  No orders of the defined types were placed in this encounter.    Procedures: No procedures performed  Clinical Data: No additional findings.  ROS:  All other systems negative, except as noted in the HPI. Review of Systems  Objective: Vital Signs: There were no vitals taken for this visit.  Specialty Comments:  No specialty comments available.  PMFS History: Patient Active Problem List   Diagnosis Date Noted   Insulin resistance 05/29/2022   BMI 28.0-28.9,adult 05/29/2022   Obesity, Beginning BMI 29.69 05/29/2022   Lesion of breast 04/24/2022   Essential hypertension 02/19/2022   SOBOE (shortness of breath on exertion) 01/22/2022   Class 1 obesity with serious comorbidity and body mass index (BMI) of 30.0 to 30.9 in adult 01/22/2022   Stage 3  chronic kidney disease (HCC) 08/17/2021   Transaminitis 08/17/2021   Vitamin D deficiency 08/17/2021   Encounter for counseling 06/19/2021   Changing skin lesion 11/11/2020   Aortic atherosclerosis (HCC) 11/10/2020   Pre-diabetes 11/10/2020   Mixed hyperlipidemia 11/10/2020   Pain in right shoulder 04/20/2020   Status post bilateral breast reduction 11/07/2018   Back pain 06/13/2018   Neck pain 06/13/2018   Symptomatic mammary hypertrophy 06/13/2018   GAD (generalized anxiety disorder) 03/13/2018   Panic disorder with agoraphobia 03/13/2018   Mild depression 03/13/2018   Upper airway cough syndrome 08/12/2017   Unilateral primary osteoarthritis, left knee 05/20/2017   Persistent cough    Moderate persistent asthma 03/19/2016   Chronic seasonal allergic rhinitis 03/19/2016   GERD (gastroesophageal reflux disease) 03/19/2016   Cough 03/19/2016   Essential hypertension, benign 03/19/2016   H/O: CVA (cerebrovascular accident) 03/19/2016   IBS (irritable bowel syndrome) 03/19/2016   Interstitial cystitis 03/19/2016   Fibromyalgia 03/19/2016   DJD (degenerative joint disease) 03/19/2016   Arthritis 03/19/2016   Sphincter of Oddi dysfunction 03/19/2016   H/O acute pancreatitis 03/19/2016   Past Medical History:  Diagnosis Date   Bilateral carotid bruits    followed by cardiologist   Bladder pain    Cervical disc disease    Chronic cough    followed by dr wert and ENT dr Harriette Ohara   Chronic kidney disease (CKD), stage III (moderate) (HCC)    Chronic seasonal allergic rhinitis    Dyspnea    with exertion    Fibromyalgia    Frequency of urination    GAD (generalized anxiety disorder)    GERD (gastroesophageal reflux disease)    Heart murmur    cardiologist--- dr croitoru   History of pancreatitis 1988-1989   History of panic attacks    History of stroke 1996   mild without residuals;  per MRI imaging 2010 left thalamic infarct without residual   Hyperlipidemia, mixed     Interstitial cystitis    urologist-- dr Annabell Howells   Irritable bowel syndrome with both constipation and diarrhea    followed by dr Leone Payor   Labile hypertension    followed by pcp;   hx normal nuclear stress test in 2009;   coronary CT 11-23-2020  normal coronaies, calcium score zero   Lumbar spondylosis    MDD (major depressive disorder)    Migraines    Moderate persistent asthma    followed by pcp and dr wert    Multiple pulmonary nodules    pulmonologist-- dr wert   Osteoarthritis    Palpitations  Panic disorder with agoraphobia    PONV (postoperative nausea and vomiting)    Pre-diabetes    Spasm of sphincter of Oddi 1987   02-13-2019  per pt no issues since she watches what medication's she takes   Urethral stenosis    s/p multiple dilatations   Urgency of urination    Vitamin D deficiency    Wears glasses     Family History  Problem Relation Age of Onset   Allergies Mother    Asthma Mother    Heart disease Mother    Osteoarthritis Mother    Diabetes Mother    Irritable bowel syndrome Mother    High blood pressure Mother    Stroke Mother    Obesity Mother    Brain cancer Father    Osteoarthritis Father    Colon polyps Father        adenomatous   Heart disease Father    Anxiety disorder Father    High Cholesterol Father    Melanoma Sister    Osteoarthritis Sister    Irritable bowel syndrome Sister    Diabetes Sister    Heart disease Sister    Stroke Sister    Colon cancer Maternal Grandmother    Ulcerative colitis Maternal Uncle    Colon cancer Cousin    Stomach cancer Neg Hx    Rheumatologic disease Neg Hx    Esophageal cancer Neg Hx    Pancreatic cancer Neg Hx     Past Surgical History:  Procedure Laterality Date   ABDOMINAL HYSTERECTOMY  1988   COMPLETE   APPENDECTOMY  07/1984   bilateral reduction mastopexy  1988   bilateral turbinate resection  1990   nasal spreading graft   BREAST CYST EXCISION Bilateral 03/18/2019   Procedure: excision  necrotic breast tissue left ;  Surgeon: Peggye Form, DO;  Location: [facility];  Service: Plastics;  Laterality: Bilateral;   BREAST CYST EXCISION Bilateral 06/30/2020   Procedure: Excision of right breast fat necrosis and left inferior breast;  Surgeon: Peggye Form, DO;  Location: [facility];  Service: Plastics;  Laterality: Bilateral;   BREAST REDUCTION SURGERY  10/17/2018   Procedure: bilateral breast reduction;  Surgeon: Peggye Form, DO;  Location: [facility];  Service: Plastics;;  please adjust time to reflect 4 hours   CATARACT EXTRACTION W/ INTRAOCULAR LENS  IMPLANT, BILATERAL  08 and 09/ 2017   CHOLECYSTECTOMY  07/1984   COLONOSCOPY  02/2004, 11/2010, LAST 03-2016 ENDO DONE ALSO   2005: Normal - Dr. Sherin Quarry 2012: Normal   CYSTO WITH HYDRODISTENSION N/A 12/17/2013   Procedure: CYSTO HYDRODISTENSION WITH INSTALLATION OF CHLOROPACTIN AND TETRACAINE;  Surgeon: Anner Crete, MD;  Location: [facility];  Service: Urology;  Laterality: N/A;   CYSTO WITH HYDRODISTENSION N/A 06/02/2015   Procedure: CYSTOSCOPY/HYDRODISTENSION WITH INSTILLATION OF CLORPACTIN ;  Surgeon: Bjorn Pippin, MD;  Location: [facility];  Service: Urology;  Laterality: N/A;   CYSTO WITH HYDRODISTENSION N/A 11/08/2016   Procedure: CYSTOSCOPY/HYDRODISTENSION INSTILLATION OF CLORPACTIN;  Surgeon: Bjorn Pippin, MD;  Location: [facility];  Service: Urology;  Laterality: N/A;   CYSTO WITH HYDRODISTENSION N/A 03/06/2018   Procedure: CYSTOSCOPY/HYDRODISTENSION INSTILL CHLORPACTION;  Surgeon: Bjorn Pippin, MD;  Location: [facility];  Service: Urology;  Laterality: N/A;   CYSTO WITH HYDRODISTENSION N/A 04/02/2019   Procedure: CYSTOSCOPY/HYDRODISTENSION INSTILL CHLORPACTIN;  Surgeon: Bjorn Pippin, MD;  Location: [facility];  Service: Urology;  Laterality: N/A;   CYSTO WITH HYDRODISTENSION N/A  02/17/2020   Procedure: CYSTOSCOPY/HYDRODISTENTION WITH INSTILLATION OF CHLORPACTIN;  Surgeon: Bjorn Pippin, MD;  Location: [facility];  Service: Urology;  Laterality: N/A;  ONLY NEEDS 30 MIN   CYSTO WITH HYDRODISTENSION N/A 02/28/2021   Procedure: CYSTOSCOPY/HYDRODISTENSION INSTILL CLORPACTION;  Surgeon: Bjorn Pippin, MD;  Location: [facility];  Service: Urology;  Laterality: N/A;   CYSTO WITH HYDRODISTENSION N/A 05/04/2022   Procedure: CYSTOSCOPY/HYDRODISTENSION INSTILL CLORPACTIN AND TETRACAINE;  Surgeon: Bjorn Pippin, MD;  Location: [facility];  Service: Urology;  Laterality: N/A;   CYSTOSCOPY WITH URETHRAL DILATATION  07/12/2011   Procedure: CYSTOSCOPY WITH URETHRAL DILATATION;  Surgeon: Anner Crete, MD;  Location: [facility];  Service: Urology;  Laterality: N/A;  HOD AND INSTILLATION OF CHLOROPACTIN C-ARM    CYSTOSCOPY WITH URETHRAL DILATATION N/A 06/26/2012   Procedure: CYSTOSCOPY WITH URETHRAL DILATATION HYDRODISTENSION AND INSTILLATION OF CLORPACTION ;  Surgeon: Anner Crete, MD;  Location: [facility];  Service: Urology;  Laterality: N/A;  CYSTOSCOPY WITH URETHRAL DILATATION HYDRODISTENSION AND INSTILLATION OF CLORPACTION    CYSTOSTOMY W/ BLADDER BIOPSY  1983   DG BARIUM SWALLOW ([facility] HX)  03/2016   DILATION AND CURETTAGE OF UTERUS  1987   ERCP  1988   for pancreatitis with stents   S/P SEVERE PANCREATITIS   HEMORRHOID SURGERY     LASIK Bilateral 07/2003   LIPOSUCTION Bilateral 10/17/2018   Procedure: LIPOSUCTION;  Surgeon: Peggye Form, DO;  Location: [facility];  Service: Plastics;  Laterality: Bilateral;   LIPOSUCTION Right 03/18/2019   Procedure: excision right necrotic breast tissue with liposuction;  Surgeon: Peggye Form, DO;  Location: [facility];  Service: Plastics;  Laterality: Right;   LIPOSUCTION Bilateral 06/30/2020   Procedure: Bilateral liposuction  of lateral breast;  Surgeon: Peggye Form, DO;  Location: [facility];  Service: Plastics;  Laterality: Bilateral;   MULTIPLE CYSTO/ HOD/ URETHRAL DILATION/ INSTILLATION CLORPACTIN  .LAST ONE 03-31-2010   NASAL SINUS SURGERY  1977   sinus cyst   REDUCTION MAMMAPLASTY Bilateral 1988   REDUCTION MAMMAPLASTY Bilateral 09/2018   REDUCTION MAMMAPLASTY Bilateral 03/2019   revision rhinoplasty  1978   RHINOPLASTY  1977   RIGHT SALPINGOOPHECTOMY  1998   TONSILLECTOMY AND ADENOIDECTOMY  1963   VIDEO BRONCHOSCOPY Bilateral 08/23/2016   Procedure: VIDEO BRONCHOSCOPY WITHOUT FLUORO;  Surgeon: Roslynn Amble, MD;  Location: [facility];  Service: Cardiopulmonary;  Laterality: Bilateral;   Social History   Occupational History   Occupation: Charity fundraiser  Tobacco Use   Smoking status: Never   Smokeless tobacco: Never  Vaping Use   Vaping status: Never Used  Substance and Sexual Activity   Alcohol use: No   Drug use: No   Sexual activity: Not on file

## 2023-01-08 ENCOUNTER — Ambulatory Visit (INDEPENDENT_AMBULATORY_CARE_PROVIDER_SITE_OTHER): Payer: PPO | Admitting: Family Medicine

## 2023-01-08 ENCOUNTER — Encounter (INDEPENDENT_AMBULATORY_CARE_PROVIDER_SITE_OTHER): Payer: Self-pay | Admitting: Family Medicine

## 2023-01-08 VITALS — BP 126/77 | HR 69 | Temp 97.6°F | Ht 59.0 in | Wt 140.0 lb

## 2023-01-08 DIAGNOSIS — E669 Obesity, unspecified: Secondary | ICD-10-CM

## 2023-01-08 DIAGNOSIS — S8991XD Unspecified injury of right lower leg, subsequent encounter: Secondary | ICD-10-CM

## 2023-01-08 DIAGNOSIS — Z6828 Body mass index (BMI) 28.0-28.9, adult: Secondary | ICD-10-CM

## 2023-01-08 DIAGNOSIS — E559 Vitamin D deficiency, unspecified: Secondary | ICD-10-CM | POA: Diagnosis not present

## 2023-01-08 DIAGNOSIS — R7303 Prediabetes: Secondary | ICD-10-CM

## 2023-01-08 MED ORDER — METFORMIN HCL 500 MG PO TABS
250.0000 mg | ORAL_TABLET | Freq: Two times a day (BID) | ORAL | 0 refills | Status: DC
Start: 2023-01-08 — End: 2023-02-04

## 2023-01-08 MED ORDER — CYCLOBENZAPRINE HCL 10 MG PO TABS: 10.0000 mg | ORAL_TABLET | Freq: Three times a day (TID) | ORAL | 0 refills | Status: DC | PRN

## 2023-01-08 NOTE — Progress Notes (Signed)
.smr  Office: [phone]  /  Fax: [phone]  WEIGHT SUMMARY AND BIOMETRICS  Anthropometric Measurements Height: 4\' 11"  (1.499 m) Weight: 140 lb (63.5 kg) BMI (Calculated): 28.26 Weight at Last Visit: 138 lb Weight Lost Since Last Visit: 0 Weight Gained Since Last Visit: 2 lb Starting Weight: 147 lb Total Weight Loss (lbs): 7 lb (3.175 kg)   Body Composition  Body Fat %: 42.1 % Fat Mass (lbs): 59 lbs Muscle Mass (lbs): 77 lbs Total Body Water (lbs): 59.2 lbs Visceral Fat Rating : 11   Other Clinical Data Fasting: No Labs: No Today's Visit #: 18 Starting Date: 08/03/21    Chief Complaint: OBESITY History of Present Illness   The patient is a 72 year old individual with a history of obesity, prediabetes, and vitamin D deficiency. She has gained two pounds in the past month and reports adherence to a food journaling plan with a calorie goal of approximately 1000 calories per day, with at least 75 grams of protein, about 50% of the time. The patient has been unable to exercise recently due to a leg injury.  The leg injury occurred when the patient sat on a swing that collapsed due to a bent nail. The fall resulted in significant bruising and swelling in the leg, with knots forming in the affected area. She has been wearing a boot for support but reports it often gets in the way.  The patient also reports feeling bloated and believes she is retaining fluid. She is currently undergoing bladder treatment for interstitial cystitis, a condition she has had for over 40 years.   Despite these challenges, the patient has made efforts to maintain a healthy diet. She reports avoiding cookies and limiting her intake of chocolate. She has been opting for healthier options when eating out, such as a grilled chicken sandwich with mustard. She also reports trying to drink more water.  The patient has been taking metformin for her prediabetes, but reports some gastrointestinal upset with  the medication. She has been trying to take it later in the day to minimize this side effect. The patient also reports running out of her Flexeril prescription, a medication she was taking for back pain. She has requested a refill for this medication.          PHYSICAL EXAM:  Blood pressure 126/77, pulse 69, temperature 97.6 F (36.4 C), height 4\' 11"  (1.499 m), weight 140 lb (63.5 kg), SpO2 98%. Body mass index is 28.28 kg/m.  DIAGNOSTIC DATA REVIEWED:  BMET    Component Value Date/Time   NA 141 12/11/2022 1056   K 4.2 12/11/2022 1056   CL 101 12/11/2022 1056   CO2 23 12/11/2022 1056   GLUCOSE 92 12/11/2022 1056   GLUCOSE 93 02/28/2021 1050   BUN 18 12/11/2022 1056   CREATININE 1.05 (H) 12/11/2022 1056   CALCIUM 9.9 12/11/2022 1056   GFRNONAA 44.4 09/11/2022 1437   GFRAA >60 12/04/2016 1351   Lab Results  Component Value Date   HGBA1C 6.1 (H) 12/11/2022   HGBA1C 6.1 (H) 08/03/2021   Lab Results  Component Value Date   INSULIN 6.3 12/11/2022   INSULIN 4.9 08/03/2021   Lab Results  Component Value Date   TSH 2.170 12/19/2021   CBC    Component Value Date/Time   WBC 7.0 05/01/2022 1158   WBC 7.7 08/12/2017 1211   RBC 4.40 05/01/2022 1158   RBC 4.34 08/12/2017 1211   HGB 13.6 05/01/2022 1158   HCT 41.0 05/01/2022 1158  PLT 259 05/01/2022 1158   MCV 93 05/01/2022 1158   MCH 30.9 05/01/2022 1158   MCH 31.6 12/04/2016 1351   MCHC 33.2 05/01/2022 1158   MCHC 34.0 08/12/2017 1211   RDW 14.2 05/01/2022 1158   Iron Studies No results found for: "IRON", "TIBC", "FERRITIN", "IRONPCTSAT" Lipid Panel     Component Value Date/Time   CHOL 128 09/11/2022 0000   CHOL 164 05/01/2022 1158   TRIG 130 09/11/2022 0000   HDL 46 09/11/2022 0000   HDL 66 05/01/2022 1158   LDLCALC 56 09/11/2022 0000   LDLCALC 73 05/01/2022 1158   Hepatic Function Panel     Component Value Date/Time   PROT 6.5 12/11/2022 1056   ALBUMIN 4.4 12/11/2022 1056   AST 26 12/11/2022 1056    ALT 22 12/11/2022 1056   ALKPHOS 98 12/11/2022 1056   BILITOT 0.4 12/11/2022 1056      Component Value Date/Time   TSH 2.170 12/19/2021 0920   Nutritional Lab Results  Component Value Date   VD25OH 72.2 12/11/2022   VD25OH 33.5 09/11/2022   VD25OH 48.2 05/01/2022     Assessment and Plan    Obesity and Prediabetes Recent weight gain of 2 pounds, likely due to fluid retention from leg injury. Adherence to dietary plan is approximately 50% with a goal of 1000 calories per day and 75 grams of protein. Exercise limited due to recent leg injury. -Continue dietary plan as tolerated. -Consider upper body exercises using exercise bands during recovery from leg injury.  Leg Injury Recent injury to leg causing pain, swelling, and bruising. No evidence of DVT. MRI pending to assess for muscle tear or bone injury. -Continue wearing boot as tolerated. -Continue with physical therapy for neck pain and incorporate exercises for leg as able. -Refill Flexeril prescription for pain management.  Prediabetes she is working on diet and exercise to treat her prediabetes -Continue current treatment regimen. -RF metformin 250mg  bid  Vitamin D Deficiency Stable on vit D supplement -Continue current management plan.  Follow-up in a few weeks to assess progress and review results of pending MRI.        She was informed of the importance of frequent follow up visits to maximize her success with intensive lifestyle modifications for her multiple health conditions.    Quillian Quince, MD

## 2023-01-11 DIAGNOSIS — R3982 Chronic bladder pain: Secondary | ICD-10-CM | POA: Diagnosis not present

## 2023-01-11 DIAGNOSIS — N3010 Interstitial cystitis (chronic) without hematuria: Secondary | ICD-10-CM | POA: Diagnosis not present

## 2023-01-11 DIAGNOSIS — R110 Nausea: Secondary | ICD-10-CM | POA: Diagnosis not present

## 2023-01-18 DIAGNOSIS — M5416 Radiculopathy, lumbar region: Secondary | ICD-10-CM | POA: Diagnosis not present

## 2023-01-21 ENCOUNTER — Ambulatory Visit
Admission: RE | Admit: 2023-01-21 | Discharge: 2023-01-21 | Disposition: A | Discharge status: Home / Self Care | Payer: PPO | Source: Ambulatory Visit | Attending: Physician Assistant

## 2023-01-21 DIAGNOSIS — S82424A Nondisplaced transverse fracture of shaft of right fibula, initial encounter for closed fracture: Secondary | ICD-10-CM | POA: Diagnosis not present

## 2023-01-21 DIAGNOSIS — R2241 Localized swelling, mass and lump, right lower limb: Secondary | ICD-10-CM | POA: Diagnosis not present

## 2023-01-21 DIAGNOSIS — M79661 Pain in right lower leg: Secondary | ICD-10-CM

## 2023-01-26 ENCOUNTER — Other Ambulatory Visit: Payer: PPO

## 2023-01-28 ENCOUNTER — Ambulatory Visit: Payer: PPO | Admitting: Physician Assistant

## 2023-01-29 ENCOUNTER — Ambulatory Visit: Payer: PPO | Admitting: Physician Assistant

## 2023-01-29 ENCOUNTER — Encounter: Payer: Self-pay | Admitting: Physician Assistant

## 2023-01-29 DIAGNOSIS — M79661 Pain in right lower leg: Secondary | ICD-10-CM

## 2023-01-29 NOTE — Progress Notes (Signed)
Office Visit Note   Patient: Brenda [name]           Date of Birth: 08-Mar-1951           MRN: [id number] Visit Date: 01/29/2023              Requested by: Chilton Greathouse, MD [address] [place],  [place] [address] PCP: Chilton Greathouse, MD  Chief Complaint  Patient presents with   Right Ankle - Follow-up      HPI: Lakiva is a pleasant 72 year old woman with a 7-week history of right lateral lower leg pain.  This occurred about 6 or 7 weeks ago after she was hit directly by a swing.  She was actually ruled out DVT but has had continuing difficulties.  X-rays did not demonstrate a fracture.  She says she is about 50% better.  Assessment & Plan: Visit Diagnoses: Right fibula shaft fracture  Plan: MRI was ordered and we reviewed this today.  MRI is consistent with a transverse fracture at the mid fibular shaft.  Reviewed this with the patient we talked about increasing her vitamin D to 5000 international units a day and making sure she is getting 12 to 1500 g of calcium.  Will follow-up in 4 weeks would like x-rays of her tib-fib at that time can use her boot as needed at work  Follow-Up Instructions: No follow-ups on file.   Ortho Exam  Patient is alert, oriented, no adenopathy, well-dressed, normal affect, normal respiratory effort. Examination she is her compartments are soft and nontender she is neurovascularly intact pulses are intact no swelling no ecchymosis she still is focally tender over the lateral midshaft of the femur negative Homans' sign  Imaging: No results found. No images are attached to the encounter.  Labs: Lab Results  Component Value Date   HGBA1C 6.1 (H) 12/11/2022   HGBA1C 5.9 09/11/2022   HGBA1C 6.1 (H) 05/01/2022   ESRSEDRATE 17 09/13/2016   CRP 0.2 (L) 09/13/2016   REPTSTATUS 08/25/2016 FINAL 08/23/2016   GRAMSTAIN Abundant 09/13/2016   GRAMSTAIN WBC present-both PMN and Mononuclear 09/13/2016   GRAMSTAIN Few Squamous Epithelial Cells  Present 09/13/2016   GRAMSTAIN Moderate Gram Positive Cocci In Pairs 09/13/2016   GRAMSTAIN  09/13/2016    Few Gram Positive Rods This specimen is acceptable for Sputum Culture   CULT (A) 08/23/2016    20,000 COLONIES/mL Consistent with normal respiratory flora. Performed at [facility], [address] [place], [place] [address]    LABORGA Normal Oropharyngeal Flora 09/13/2016     Lab Results  Component Value Date   ALBUMIN 4.4 12/11/2022   ALBUMIN 3.6 09/11/2022   ALBUMIN 4.5 05/01/2022    No results found for: "MG" Lab Results  Component Value Date   VD25OH 72.2 12/11/2022   VD25OH 33.5 09/11/2022   VD25OH 48.2 05/01/2022    No results found for: "PREALBUMIN"    Latest Ref Rng & Units 05/01/2022   11:58 AM 02/28/2021   10:50 AM 02/17/2020   10:26 AM  CBC EXTENDED  WBC 3.4 - 10.8 x10E3/uL 7.0     RBC 3.77 - 5.28 x10E6/uL 4.40     Hemoglobin 11.1 - 15.9 g/dL 86.5  78.4  69.6   HCT 34.0 - 46.6 % 41.0  45.0  45.0   Platelets 150 - 450 x10E3/uL 259     NEUT# 1.4 - 7.0 x10E3/uL 4.5     Lymph# 0.7 - 3.1 x10E3/uL 1.8  There is no height or weight on file to calculate BMI.  Orders:  No orders of the defined types were placed in this encounter.  No orders of the defined types were placed in this encounter.    Procedures: No procedures performed  Clinical Data: No additional findings.  ROS:  All other systems negative, except as noted in the HPI. Review of Systems  Objective: Vital Signs: There were no vitals taken for this visit.  Specialty Comments:  No specialty comments available.  PMFS History: Patient Active Problem List   Diagnosis Date Noted   Insulin resistance 05/29/2022   BMI 28.0-28.9,adult 05/29/2022   Obesity, Beginning BMI 29.69 05/29/2022   Lesion of breast 04/24/2022   Essential hypertension 02/19/2022   SOBOE (shortness of breath on exertion) 01/22/2022   Class 1 obesity with serious comorbidity and body mass index  (BMI) of 30.0 to 30.9 in adult 01/22/2022   Stage 3 chronic kidney disease (HCC) 08/17/2021   Transaminitis 08/17/2021   Vitamin D deficiency 08/17/2021   Encounter for counseling 06/19/2021   Changing skin lesion 11/11/2020   Aortic atherosclerosis (HCC) 11/10/2020   Pre-diabetes 11/10/2020   Mixed hyperlipidemia 11/10/2020   Pain in right shoulder 04/20/2020   Status post bilateral breast reduction 11/07/2018   Back pain 06/13/2018   Neck pain 06/13/2018   Symptomatic mammary hypertrophy 06/13/2018   GAD (generalized anxiety disorder) 03/13/2018   Panic disorder with agoraphobia 03/13/2018   Mild depression 03/13/2018   Upper airway cough syndrome 08/12/2017   Unilateral primary osteoarthritis, left knee 05/20/2017   Persistent cough    Moderate persistent asthma 03/19/2016   Chronic seasonal allergic rhinitis 03/19/2016   GERD (gastroesophageal reflux disease) 03/19/2016   Cough 03/19/2016   Essential hypertension, benign 03/19/2016   H/O: CVA (cerebrovascular accident) 03/19/2016   IBS (irritable bowel syndrome) 03/19/2016   Interstitial cystitis 03/19/2016   Fibromyalgia 03/19/2016   DJD (degenerative joint disease) 03/19/2016   Arthritis 03/19/2016   Sphincter of Oddi dysfunction 03/19/2016   H/O acute pancreatitis 03/19/2016   Past Medical History:  Diagnosis Date   Bilateral carotid bruits    followed by cardiologist   Bladder pain    Cervical disc disease    Chronic cough    followed by dr wert and ENT dr Harriette Ohara   Chronic kidney disease (CKD), stage III (moderate) (HCC)    Chronic seasonal allergic rhinitis    Dyspnea    with exertion    Fibromyalgia    Frequency of urination    GAD (generalized anxiety disorder)    GERD (gastroesophageal reflux disease)    Heart murmur    cardiologist--- dr croitoru   History of pancreatitis 1988-1989   History of panic attacks    History of stroke 1996   mild without residuals;  per MRI imaging 2010 left  thalamic infarct without residual   Hyperlipidemia, mixed    Interstitial cystitis    urologist-- dr Annabell Howells   Irritable bowel syndrome with both constipation and diarrhea    followed by dr Leone Payor   Labile hypertension    followed by pcp;   hx normal nuclear stress test in 2009;   coronary CT 11-23-2020  normal coronaies, calcium score zero   Lumbar spondylosis    MDD (major depressive disorder)    Migraines    Moderate persistent asthma    followed by pcp and dr wert    Multiple pulmonary nodules    pulmonologist-- dr wert   Osteoarthritis  Palpitations    Panic disorder with agoraphobia    PONV (postoperative nausea and vomiting)    Pre-diabetes    Spasm of sphincter of Oddi 1987   02-13-2019  per pt no issues since she watches what medication's she takes   Urethral stenosis    s/p multiple dilatations   Urgency of urination    Vitamin D deficiency    Wears glasses     Family History  Problem Relation Age of Onset   Allergies Mother    Asthma Mother    Heart disease Mother    Osteoarthritis Mother    Diabetes Mother    Irritable bowel syndrome Mother    High blood pressure Mother    Stroke Mother    Obesity Mother    Brain cancer Father    Osteoarthritis Father    Colon polyps Father        adenomatous   Heart disease Father    Anxiety disorder Father    High Cholesterol Father    Melanoma Sister    Osteoarthritis Sister    Irritable bowel syndrome Sister    Diabetes Sister    Heart disease Sister    Stroke Sister    Colon cancer Maternal Grandmother    Ulcerative colitis Maternal Uncle    Colon cancer Cousin    Stomach cancer Neg Hx    Rheumatologic disease Neg Hx    Esophageal cancer Neg Hx    Pancreatic cancer Neg Hx     Past Surgical History:  Procedure Laterality Date   ABDOMINAL HYSTERECTOMY  1988   COMPLETE   APPENDECTOMY  07/1984   bilateral reduction mastopexy  1988   bilateral turbinate resection  1990   nasal spreading graft   BREAST  CYST EXCISION Bilateral 03/18/2019   Procedure: excision necrotic breast tissue left ;  Surgeon: Peggye Form, DO;  Location: [facility];  Service: Plastics;  Laterality: Bilateral;   BREAST CYST EXCISION Bilateral 06/30/2020   Procedure: Excision of right breast fat necrosis and left inferior breast;  Surgeon: Peggye Form, DO;  Location: [facility];  Service: Plastics;  Laterality: Bilateral;   BREAST REDUCTION SURGERY  10/17/2018   Procedure: bilateral breast reduction;  Surgeon: Peggye Form, DO;  Location: [facility];  Service: Plastics;;  please adjust time to reflect 4 hours   CATARACT EXTRACTION W/ INTRAOCULAR LENS  IMPLANT, BILATERAL  08 and 09/ 2017   CHOLECYSTECTOMY  07/1984   COLONOSCOPY  02/2004, 11/2010, LAST 03-2016 ENDO DONE ALSO   2005: Normal - Dr. Sherin Quarry 2012: Normal   CYSTO WITH HYDRODISTENSION N/A 12/17/2013   Procedure: CYSTO HYDRODISTENSION WITH INSTALLATION OF CHLOROPACTIN AND TETRACAINE;  Surgeon: Anner Crete, MD;  Location[facility];  Service: Urology;  Laterality: N/A;   CYSTO WITH HYDRODISTENSION N/A 06/02/2015   Procedure: CYSTOSCOPY/HYDRODISTENSION WITH INSTILLATION OF CLORPACTIN ;  Surgeon: Bjorn Pippin, MD;  Location: [facility];  Service: Urology;  Laterality: N/A;   CYSTO WITH HYDRODISTENSION N/A 11/08/2016   Procedure: CYSTOSCOPY/HYDRODISTENSION INSTILLATION OF CLORPACTIN;  Surgeon: Bjorn Pippin, MD;  Location: [facility];  Service: Urology;  Laterality: N/A;   CYSTO WITH HYDRODISTENSION N/A 03/06/2018   Procedure: CYSTOSCOPY/HYDRODISTENSION INSTILL CHLORPACTION;  Surgeon: Bjorn Pippin, MD;  Location: [facility];  Service: Urology;  Laterality: N/A;   CYSTO WITH HYDRODISTENSION N/A 04/02/2019   Procedure: CYSTOSCOPY/HYDRODISTENSION INSTILL CHLORPACTIN;  Surgeon: Bjorn Pippin, MD;  Location: [facility];  Service:  Urology;  Laterality: N/A;   CYSTO WITH HYDRODISTENSION N/A 02/17/2020   Procedure: CYSTOSCOPY/HYDRODISTENTION WITH INSTILLATION OF CHLORPACTIN;  Surgeon: Bjorn Pippin, MD;  Location: [facility];  Service: Urology;  Laterality: N/A;  ONLY NEEDS 30 MIN   CYSTO WITH HYDRODISTENSION N/A 02/28/2021   Procedure: CYSTOSCOPY/HYDRODISTENSION INSTILL CLORPACTION;  Surgeon: Bjorn Pippin, MD;  Location: [facility];  Service: Urology;  Laterality: N/A;   CYSTO WITH HYDRODISTENSION N/A 05/04/2022   Procedure: CYSTOSCOPY/HYDRODISTENSION INSTILL CLORPACTIN AND TETRACAINE;  Surgeon: Bjorn Pippin, MD;  Location: [facility];  Service: Urology;  Laterality: N/A;   CYSTOSCOPY WITH URETHRAL DILATATION  07/12/2011   Procedure: CYSTOSCOPY WITH URETHRAL DILATATION;  Surgeon: Anner Crete, MD;  Location: [facility];  Service: Urology;  Laterality: N/A;  HOD AND INSTILLATION OF CHLOROPACTIN C-ARM    CYSTOSCOPY WITH URETHRAL DILATATION N/A 06/26/2012   Procedure: CYSTOSCOPY WITH URETHRAL DILATATION HYDRODISTENSION AND INSTILLATION OF CLORPACTION ;  Surgeon: Anner Crete, MD;  Location: [facility];  Service: Urology;  Laterality: N/A;  CYSTOSCOPY WITH URETHRAL DILATATION HYDRODISTENSION AND INSTILLATION OF CLORPACTION    CYSTOSTOMY W/ BLADDER BIOPSY  1983   DG BARIUM SWALLOW ([facility] HX)  03/2016   DILATION AND CURETTAGE OF UTERUS  1987   ERCP  1988   for pancreatitis with stents   S/P SEVERE PANCREATITIS   HEMORRHOID SURGERY     LASIK Bilateral 07/2003   LIPOSUCTION Bilateral 10/17/2018   Procedure: LIPOSUCTION;  Surgeon: Peggye Form, DO;  Location: [facility];  Service: Plastics;  Laterality: Bilateral;   LIPOSUCTION Right 03/18/2019   Procedure: excision right necrotic breast tissue with liposuction;  Surgeon: Peggye Form, DO;  Location: [facility];  Service: Plastics;  Laterality: Right;    LIPOSUCTION Bilateral 06/30/2020   Procedure: Bilateral liposuction of lateral breast;  Surgeon: Peggye Form, DO;  Location: [facility];  Service: Plastics;  Laterality: Bilateral;   MULTIPLE CYSTO/ HOD/ URETHRAL DILATION/ INSTILLATION CLORPACTIN  .LAST ONE 03-31-2010   NASAL SINUS SURGERY  1977   sinus cyst   REDUCTION MAMMAPLASTY Bilateral 1988   REDUCTION MAMMAPLASTY Bilateral 09/2018   REDUCTION MAMMAPLASTY Bilateral 03/2019   revision rhinoplasty  1978   RHINOPLASTY  1977   RIGHT SALPINGOOPHECTOMY  1998   TONSILLECTOMY AND ADENOIDECTOMY  1963   VIDEO BRONCHOSCOPY Bilateral 08/23/2016   Procedure: VIDEO BRONCHOSCOPY WITHOUT FLUORO;  Surgeon: Roslynn Amble, MD;  Location: [facility];  Service: Cardiopulmonary;  Laterality: Bilateral;   Social History   Occupational History   Occupation: Charity fundraiser  Tobacco Use   Smoking status: Never   Smokeless tobacco: Never  Vaping Use   Vaping status: Never Used  Substance and Sexual Activity   Alcohol use: No   Drug use: No   Sexual activity: Not on file

## 2023-02-02 DIAGNOSIS — Z23 Encounter for immunization: Secondary | ICD-10-CM | POA: Diagnosis not present

## 2023-02-04 ENCOUNTER — Other Ambulatory Visit: Payer: Self-pay | Admitting: Psychiatry

## 2023-02-04 ENCOUNTER — Ambulatory Visit (INDEPENDENT_AMBULATORY_CARE_PROVIDER_SITE_OTHER): Payer: PPO | Admitting: Family Medicine

## 2023-02-04 ENCOUNTER — Encounter (INDEPENDENT_AMBULATORY_CARE_PROVIDER_SITE_OTHER): Payer: Self-pay | Admitting: Family Medicine

## 2023-02-04 VITALS — BP 106/70 | HR 70 | Temp 98.3°F | Ht 59.0 in | Wt 138.0 lb

## 2023-02-04 DIAGNOSIS — Z6828 Body mass index (BMI) 28.0-28.9, adult: Secondary | ICD-10-CM

## 2023-02-04 DIAGNOSIS — E669 Obesity, unspecified: Secondary | ICD-10-CM | POA: Diagnosis not present

## 2023-02-04 DIAGNOSIS — E559 Vitamin D deficiency, unspecified: Secondary | ICD-10-CM | POA: Diagnosis not present

## 2023-02-04 DIAGNOSIS — R7303 Prediabetes: Secondary | ICD-10-CM

## 2023-02-04 DIAGNOSIS — F5105 Insomnia due to other mental disorder: Secondary | ICD-10-CM

## 2023-02-04 MED ORDER — METFORMIN HCL 500 MG PO TABS
250.0000 mg | ORAL_TABLET | Freq: Two times a day (BID) | ORAL | 0 refills | Status: DC
Start: 2023-02-04 — End: 2023-03-12

## 2023-02-04 NOTE — Telephone Encounter (Signed)
Rf appropriate; LF 04/09/22; OFFICE NOTE 09/18/22: "Ok prn Remus Loffler. Used infrequently. Disc amnesia. Disc negative effect of food on efficacy and how to deal with it." NV 09/24/22

## 2023-02-04 NOTE — Progress Notes (Signed)
Chief Complaint:   OBESITY Brenda [name] is here to discuss her progress with her obesity treatment plan along with follow-up of her obesity related diagnoses. Brenda [name] is on keeping a food journal and adhering to recommended goals of 1000 calories and 75+ grams of protein and states she is following her eating plan approximately 70% of the time. Brenda [name] states she is doing 0 minutes 0 times per week.  Today's visit was #: 18 Starting weight: 147 lbs Starting date: 08/03/2021 Today's weight: 138 lbs Today's date: 02/04/2023 Total lbs lost to date: 9 Total lbs lost since last in-office visit: 0  Interim History: Patient continues to work on her weight loss.  She is frustrated that her fibula and her exercise has decreased.  Her hunger is mostly controlled.  Subjective:   1. Vitamin D deficiency Patient is on vitamin D 5000 IU now with recent fibula function.  She is also on a OTC calcium.  2. Pre-diabetes Patient is on metformin, and she is working on her diet and exercise.  No side effects were noted.  Assessment/Plan:   1. Vitamin D deficiency Patient is to continue vitamin D and calcium as is, and we will recheck labs in 2 to 3 months.  2. Pre-diabetes Patient will continue metformin 500 mg 1/2 tablet daily, and we will refill for 1 month.  - metFORMIN (GLUCOPHAGE) 500 MG tablet; Take 0.5 tablets (250 mg total) by mouth 2 (two) times daily with a meal.  Dispense: 30 tablet; Refill: 0  3. BMI 28.0-28.9,adult  4. Obesity, Beginning BMI 29.69 Brenda [name] is currently in the action stage of change. As such, her goal is to continue with weight loss efforts. She has agreed to keeping a food journal and adhering to recommended goals of 1000-1200 calories and 75+ grams of protein daily.   Behavioral modification strategies: increasing lean protein intake.  Brenda [name] has agreed to follow-up with our clinic in 4 weeks. She was informed of the importance of frequent follow-up visits to maximize her  success with intensive lifestyle modifications for her multiple health conditions.   Objective:   Blood pressure 106/70, pulse 70, temperature 98.3 F (36.8 C), height 4\' 11"  (1.499 m), weight 138 lb (62.6 kg), SpO2 99%. Body mass index is 27.87 kg/m.  Lab Results  Component Value Date   CREATININE 1.05 (H) 12/11/2022   BUN 18 12/11/2022   NA 141 12/11/2022   K 4.2 12/11/2022   CL 101 12/11/2022   CO2 23 12/11/2022   Lab Results  Component Value Date   ALT 22 12/11/2022   AST 26 12/11/2022   ALKPHOS 98 12/11/2022   BILITOT 0.4 12/11/2022   Lab Results  Component Value Date   HGBA1C 6.1 (H) 12/11/2022   HGBA1C 5.9 09/11/2022   HGBA1C 6.1 (H) 05/01/2022   HGBA1C 6.1 (H) 12/19/2021   HGBA1C 6.1 (H) 08/03/2021   Lab Results  Component Value Date   INSULIN 6.3 12/11/2022   INSULIN 10.7 05/01/2022   INSULIN 10.4 12/19/2021   INSULIN 4.9 08/03/2021   Lab Results  Component Value Date   TSH 2.170 12/19/2021   Lab Results  Component Value Date   CHOL 128 09/11/2022   HDL 46 09/11/2022   LDLCALC 56 09/11/2022   TRIG 130 09/11/2022   Lab Results  Component Value Date   VD25OH 72.2 12/11/2022   VD25OH 33.5 09/11/2022   VD25OH 48.2 05/01/2022   Lab Results  Component Value Date   WBC 7.0 05/01/2022  HGB 13.6 05/01/2022   HCT 41.0 05/01/2022   MCV 93 05/01/2022   PLT 259 05/01/2022   No results found for: "IRON", "TIBC", "FERRITIN"  Attestation Statements:   Reviewed by clinician on day of visit: allergies, medications, problem list, medical history, surgical history, family history, social history, and previous encounter notes.   I, Burt Knack, am acting as transcriptionist for Quillian Quince, MD.  I have reviewed the above documentation for accuracy and completeness, and I agree with the above. -  Quillian Quince, MD

## 2023-02-05 DIAGNOSIS — M5412 Radiculopathy, cervical region: Secondary | ICD-10-CM | POA: Diagnosis not present

## 2023-02-08 DIAGNOSIS — R110 Nausea: Secondary | ICD-10-CM | POA: Diagnosis not present

## 2023-02-08 DIAGNOSIS — N3010 Interstitial cystitis (chronic) without hematuria: Secondary | ICD-10-CM | POA: Diagnosis not present

## 2023-02-11 ENCOUNTER — Encounter (INDEPENDENT_AMBULATORY_CARE_PROVIDER_SITE_OTHER): Payer: Self-pay

## 2023-02-11 DIAGNOSIS — Z719 Counseling, unspecified: Secondary | ICD-10-CM

## 2023-02-26 DIAGNOSIS — M5412 Radiculopathy, cervical region: Secondary | ICD-10-CM | POA: Diagnosis not present

## 2023-03-01 ENCOUNTER — Encounter: Payer: Self-pay | Admitting: Physician Assistant

## 2023-03-01 ENCOUNTER — Other Ambulatory Visit (INDEPENDENT_AMBULATORY_CARE_PROVIDER_SITE_OTHER): Payer: PPO

## 2023-03-01 ENCOUNTER — Ambulatory Visit: Payer: PPO | Admitting: Physician Assistant

## 2023-03-01 ENCOUNTER — Other Ambulatory Visit (INDEPENDENT_AMBULATORY_CARE_PROVIDER_SITE_OTHER): Payer: Self-pay

## 2023-03-01 DIAGNOSIS — M25562 Pain in left knee: Secondary | ICD-10-CM

## 2023-03-01 DIAGNOSIS — N3010 Interstitial cystitis (chronic) without hematuria: Secondary | ICD-10-CM | POA: Diagnosis not present

## 2023-03-01 DIAGNOSIS — M79661 Pain in right lower leg: Secondary | ICD-10-CM

## 2023-03-01 NOTE — Progress Notes (Signed)
Office Visit Note   Patient: Brenda [name]           Date of Birth: Aug 11, 1950           MRN: [id number] Visit Date: 03/01/2023              Requested by: Chilton Greathouse, MD [address] [place],  [place] [address] PCP: Chilton Greathouse, MD      HPI: Patient is in follow-up today for her right leg.  She has a history of a fibula fracture diagnosed by MRI.  She is feeling a little bit better but also now complaining of left knee pain no injury is not sure if she is offloading it because it mild to moderate.  She is now about 3 months since the injury to her right lower leg  Assessment & Plan: Visit Diagnoses:  1. Pain in right lower leg   2. Left knee pain, unspecified chronicity     Plan: 3 months now status post direct impact injury to the right midshaft fibula.  She is doing a lot better she has been taking calcium and vitamin D.  X-ray today does show robust callus around the area of the fracture that was defined by MRI not by plain x-rays knee pain do not see anything on her left knee maybe a little arthritis I think she is just been offloading onto her left knee her exam is benign may follow-up as needed   Follow-Up Instructions: As needed  Ortho Exam  Patient is alert, oriented, no adenopathy, well-dressed, normal affect, normal respiratory effort. Right lower extremity she is neurovascularly intact compartments are soft and compressible negative Homans' sign some tenderness over the area of the fracture.  No bruising or antalgic gait. Left knee no effusion no erythema no grinding with range of motion compartments are soft and nontender no tenderness over the joint line she is neurovascular intact  Imaging: No results found. No images are attached to the encounter.  Labs: Lab Results  Component Value Date   HGBA1C 6.1 (H) 12/11/2022   HGBA1C 5.9 09/11/2022   HGBA1C 6.1 (H) 05/01/2022   ESRSEDRATE 17 09/13/2016   CRP 0.2 (L) 09/13/2016   REPTSTATUS 08/25/2016  FINAL 08/23/2016   GRAMSTAIN Abundant 09/13/2016   GRAMSTAIN WBC present-both PMN and Mononuclear 09/13/2016   GRAMSTAIN Few Squamous Epithelial Cells Present 09/13/2016   GRAMSTAIN Moderate Gram Positive Cocci In Pairs 09/13/2016   GRAMSTAIN  09/13/2016    Few Gram Positive Rods This specimen is acceptable for Sputum Culture   CULT (A) 08/23/2016    20,000 COLONIES/mL Consistent with normal respiratory flora. Performed at [facility], [address] [place], [place] [address]    LABORGA Normal Oropharyngeal Flora 09/13/2016     Lab Results  Component Value Date   ALBUMIN 4.4 12/11/2022   ALBUMIN 3.6 09/11/2022   ALBUMIN 4.5 05/01/2022    No results found for: "MG" Lab Results  Component Value Date   VD25OH 72.2 12/11/2022   VD25OH 33.5 09/11/2022   VD25OH 48.2 05/01/2022    No results found for: "PREALBUMIN"    Latest Ref Rng & Units 05/01/2022   11:58 AM 02/28/2021   10:50 AM 02/17/2020   10:26 AM  CBC EXTENDED  WBC 3.4 - 10.8 x10E3/uL 7.0     RBC 3.77 - 5.28 x10E6/uL 4.40     Hemoglobin 11.1 - 15.9 g/dL 65.7  84.6  96.2   HCT 34.0 - 46.6 % 41.0  45.0  45.0   Platelets 150 - 450 x10E3/uL 259     NEUT# 1.4 - 7.0 x10E3/uL 4.5     Lymph# 0.7 - 3.1 x10E3/uL 1.8        There is no height or weight on file to calculate BMI.  Orders:  Orders Placed This Encounter  Procedures   XR Tibia/Fibula Right   XR KNEE 3 VIEW LEFT   No orders of the defined types were placed in this encounter.    Procedures: No procedures performed  Clinical Data: No additional findings.  ROS:  All other systems negative, except as noted in the HPI. Review of Systems  Objective: Vital Signs: There were no vitals taken for this visit.  Specialty Comments:  No specialty comments available.  PMFS History: Patient Active Problem List   Diagnosis Date Noted   Insulin resistance 05/29/2022   BMI 28.0-28.9,adult 05/29/2022   Obesity, Beginning BMI 29.69 05/29/2022    Lesion of breast 04/24/2022   Essential hypertension 02/19/2022   SOBOE (shortness of breath on exertion) 01/22/2022   Class 1 obesity with serious comorbidity and body mass index (BMI) of 30.0 to 30.9 in adult 01/22/2022   Stage 3 chronic kidney disease (HCC) 08/17/2021   Transaminitis 08/17/2021   Vitamin D deficiency 08/17/2021   Encounter for counseling 06/19/2021   Changing skin lesion 11/11/2020   Aortic atherosclerosis (HCC) 11/10/2020   Pre-diabetes 11/10/2020   Mixed hyperlipidemia 11/10/2020   Pain in right shoulder 04/20/2020   Status post bilateral breast reduction 11/07/2018   Back pain 06/13/2018   Neck pain 06/13/2018   Symptomatic mammary hypertrophy 06/13/2018   GAD (generalized anxiety disorder) 03/13/2018   Panic disorder with agoraphobia 03/13/2018   Mild depression 03/13/2018   Upper airway cough syndrome 08/12/2017   Unilateral primary osteoarthritis, left knee 05/20/2017   Persistent cough    Moderate persistent asthma 03/19/2016   Chronic seasonal allergic rhinitis 03/19/2016   GERD (gastroesophageal reflux disease) 03/19/2016   Cough 03/19/2016   Essential hypertension, benign 03/19/2016   H/O: CVA (cerebrovascular accident) 03/19/2016   IBS (irritable bowel syndrome) 03/19/2016   Interstitial cystitis 03/19/2016   Fibromyalgia 03/19/2016   DJD (degenerative joint disease) 03/19/2016   Arthritis 03/19/2016   Sphincter of Oddi dysfunction 03/19/2016   H/O acute pancreatitis 03/19/2016   Past Medical History:  Diagnosis Date   Bilateral carotid bruits    followed by cardiologist   Bladder pain    Cervical disc disease    Chronic cough    followed by dr wert and ENT dr Harriette Ohara   Chronic kidney disease (CKD), stage III (moderate) (HCC)    Chronic seasonal allergic rhinitis    Dyspnea    with exertion    Fibromyalgia    Frequency of urination    GAD (generalized anxiety disorder)    GERD (gastroesophageal reflux disease)    Heart murmur     cardiologist--- dr croitoru   History of pancreatitis 1988-1989   History of panic attacks    History of stroke 1996   mild without residuals;  per MRI imaging 2010 left thalamic infarct without residual   Hyperlipidemia, mixed    Interstitial cystitis    urologist-- dr Annabell Howells   Irritable bowel syndrome with both constipation and diarrhea    followed by dr Leone Payor   Labile hypertension    followed by pcp;   hx normal nuclear stress test in 2009;   coronary CT 11-23-2020  normal coronaies, calcium score zero   Lumbar  spondylosis    MDD (major depressive disorder)    Migraines    Moderate persistent asthma    followed by pcp and dr wert    Multiple pulmonary nodules    pulmonologist-- dr wert   Osteoarthritis    Palpitations    Panic disorder with agoraphobia    PONV (postoperative nausea and vomiting)    Pre-diabetes    Spasm of sphincter of Oddi 1987   02-13-2019  per pt no issues since she watches what medication's she takes   Urethral stenosis    s/p multiple dilatations   Urgency of urination    Vitamin D deficiency    Wears glasses     Family History  Problem Relation Age of Onset   Allergies Mother    Asthma Mother    Heart disease Mother    Osteoarthritis Mother    Diabetes Mother    Irritable bowel syndrome Mother    High blood pressure Mother    Stroke Mother    Obesity Mother    Brain cancer Father    Osteoarthritis Father    Colon polyps Father        adenomatous   Heart disease Father    Anxiety disorder Father    High Cholesterol Father    Melanoma Sister    Osteoarthritis Sister    Irritable bowel syndrome Sister    Diabetes Sister    Heart disease Sister    Stroke Sister    Colon cancer Maternal Grandmother    Ulcerative colitis Maternal Uncle    Colon cancer Cousin    Stomach cancer Neg Hx    Rheumatologic disease Neg Hx    Esophageal cancer Neg Hx    Pancreatic cancer Neg Hx     Past Surgical History:  Procedure Laterality Date    ABDOMINAL HYSTERECTOMY  1988   COMPLETE   APPENDECTOMY  07/1984   bilateral reduction mastopexy  1988   bilateral turbinate resection  1990   nasal spreading graft   BREAST CYST EXCISION Bilateral 03/18/2019   Procedure: excision necrotic breast tissue left ;  Surgeon: Peggye Form, DO;  Location: [facility];  Service: Plastics;  Laterality: Bilateral;   BREAST CYST EXCISION Bilateral 06/30/2020   Procedure: Excision of right breast fat necrosis and left inferior breast;  Surgeon: Peggye Form, DO;  Location: [facility];  Service: Plastics;  Laterality: Bilateral;   BREAST REDUCTION SURGERY  10/17/2018   Procedure: bilateral breast reduction;  Surgeon: Peggye Form, DO;  Location: [facility];  Service: Plastics;;  please adjust time to reflect 4 hours   CATARACT EXTRACTION W/ INTRAOCULAR LENS  IMPLANT, BILATERAL  08 and 09/ 2017   CHOLECYSTECTOMY  07/1984   COLONOSCOPY  02/2004, 11/2010, LAST 03-2016 ENDO DONE ALSO   2005: Normal - Dr. Sherin Quarry 2012: Normal   CYSTO WITH HYDRODISTENSION N/A 12/17/2013   Procedure: CYSTO HYDRODISTENSION WITH INSTALLATION OF CHLOROPACTIN AND TETRACAINE;  Surgeon: Anner Crete, MD;  Location: [facility];  Service: Urology;  Laterality: N/A;   CYSTO WITH HYDRODISTENSION N/A 06/02/2015   Procedure: CYSTOSCOPY/HYDRODISTENSION WITH INSTILLATION OF CLORPACTIN ;  Surgeon: Bjorn Pippin, MD;  Location: [facility];  Service: Urology;  Laterality: N/A;   CYSTO WITH HYDRODISTENSION N/A 11/08/2016   Procedure: CYSTOSCOPY/HYDRODISTENSION INSTILLATION OF CLORPACTIN;  Surgeon: Bjorn Pippin, MD;  Location: [facility];  Service: Urology;  Laterality: N/A;   CYSTO WITH HYDRODISTENSION N/A 03/06/2018  Procedure: CYSTOSCOPY/HYDRODISTENSION INSTILL CHLORPACTION;  Surgeon: Bjorn Pippin, MD;  Location: [facility];  Service: Urology;  Laterality: N/A;   CYSTO  WITH HYDRODISTENSION N/A 04/02/2019   Procedure: CYSTOSCOPY/HYDRODISTENSION INSTILL CHLORPACTIN;  Surgeon: Bjorn Pippin, MD;  Location[facility];  Service: Urology;  Laterality: N/A;   CYSTO WITH HYDRODISTENSION N/A 02/17/2020   Procedure: CYSTOSCOPY/HYDRODISTENTION WITH INSTILLATION OF CHLORPACTIN;  Surgeon: Bjorn Pippin, MD;  Location: [facility];  Service: Urology;  Laterality: N/A;  ONLY NEEDS 30 MIN   CYSTO WITH HYDRODISTENSION N/A 02/28/2021   Procedure: CYSTOSCOPY/HYDRODISTENSION INSTILL CLORPACTION;  Surgeon: Bjorn Pippin, MD;  Location: [facility];  Service: Urology;  Laterality: N/A;   CYSTO WITH HYDRODISTENSION N/A 05/04/2022   Procedure: CYSTOSCOPY/HYDRODISTENSION INSTILL CLORPACTIN AND TETRACAINE;  Surgeon: Bjorn Pippin, MD;  Location: [facility];  Service: Urology;  Laterality: N/A;   CYSTOSCOPY WITH URETHRAL DILATATION  07/12/2011   Procedure: CYSTOSCOPY WITH URETHRAL DILATATION;  Surgeon: Anner Crete, MD;  Location: [facility];  Service: Urology;  Laterality: N/A;  HOD AND INSTILLATION OF CHLOROPACTIN C-ARM    CYSTOSCOPY WITH URETHRAL DILATATION N/A 06/26/2012   Procedure: CYSTOSCOPY WITH URETHRAL DILATATION HYDRODISTENSION AND INSTILLATION OF CLORPACTION ;  Surgeon: Anner Crete, MD;  Location: [facility];  Service: Urology;  Laterality: N/A;  CYSTOSCOPY WITH URETHRAL DILATATION HYDRODISTENSION AND INSTILLATION OF CLORPACTION    CYSTOSTOMY W/ BLADDER BIOPSY  1983   DG BARIUM SWALLOW ([facility] HX)  03/2016   DILATION AND CURETTAGE OF UTERUS  1987   ERCP  1988   for pancreatitis with stents   S/P SEVERE PANCREATITIS   HEMORRHOID SURGERY     LASIK Bilateral 07/2003   LIPOSUCTION Bilateral 10/17/2018   Procedure: LIPOSUCTION;  Surgeon: Peggye Form, DO;  Location: [facility];  Service: Plastics;  Laterality: Bilateral;   LIPOSUCTION Right 03/18/2019   Procedure:  excision right necrotic breast tissue with liposuction;  Surgeon: Peggye Form, DO;  Location: [facility];  Service: Plastics;  Laterality: Right;   LIPOSUCTION Bilateral 06/30/2020   Procedure: Bilateral liposuction of lateral breast;  Surgeon: Peggye Form, DO;  Location: [facility];  Service: Plastics;  Laterality: Bilateral;   MULTIPLE CYSTO/ HOD/ URETHRAL DILATION/ INSTILLATION CLORPACTIN  .LAST ONE 03-31-2010   NASAL SINUS SURGERY  1977   sinus cyst   REDUCTION MAMMAPLASTY Bilateral 1988   REDUCTION MAMMAPLASTY Bilateral 09/2018   REDUCTION MAMMAPLASTY Bilateral 03/2019   revision rhinoplasty  1978   RHINOPLASTY  1977   RIGHT SALPINGOOPHECTOMY  1998   TONSILLECTOMY AND ADENOIDECTOMY  1963   VIDEO BRONCHOSCOPY Bilateral 08/23/2016   Procedure: VIDEO BRONCHOSCOPY WITHOUT FLUORO;  Surgeon: Roslynn Amble, MD;  Location: [facility];  Service: Cardiopulmonary;  Laterality: Bilateral;   Social History   Occupational History   Occupation: Charity fundraiser  Tobacco Use   Smoking status: Never   Smokeless tobacco: Never  Vaping Use   Vaping status: Never Used  Substance and Sexual Activity   Alcohol use: No   Drug use: No   Sexual activity: Not on file

## 2023-03-12 ENCOUNTER — Encounter (INDEPENDENT_AMBULATORY_CARE_PROVIDER_SITE_OTHER): Payer: Self-pay | Admitting: Family Medicine

## 2023-03-12 ENCOUNTER — Ambulatory Visit (INDEPENDENT_AMBULATORY_CARE_PROVIDER_SITE_OTHER): Payer: PPO | Admitting: Family Medicine

## 2023-03-12 VITALS — BP 134/80 | HR 68 | Temp 97.4°F | Ht 59.0 in | Wt 140.0 lb

## 2023-03-12 DIAGNOSIS — R7301 Impaired fasting glucose: Secondary | ICD-10-CM | POA: Diagnosis not present

## 2023-03-12 DIAGNOSIS — R7303 Prediabetes: Secondary | ICD-10-CM

## 2023-03-12 DIAGNOSIS — Z6828 Body mass index (BMI) 28.0-28.9, adult: Secondary | ICD-10-CM

## 2023-03-12 DIAGNOSIS — E559 Vitamin D deficiency, unspecified: Secondary | ICD-10-CM | POA: Diagnosis not present

## 2023-03-12 DIAGNOSIS — E669 Obesity, unspecified: Secondary | ICD-10-CM

## 2023-03-12 DIAGNOSIS — E785 Hyperlipidemia, unspecified: Secondary | ICD-10-CM | POA: Diagnosis not present

## 2023-03-12 DIAGNOSIS — I129 Hypertensive chronic kidney disease with stage 1 through stage 4 chronic kidney disease, or unspecified chronic kidney disease: Secondary | ICD-10-CM | POA: Diagnosis not present

## 2023-03-12 DIAGNOSIS — M5412 Radiculopathy, cervical region: Secondary | ICD-10-CM | POA: Diagnosis not present

## 2023-03-12 LAB — HEPATIC FUNCTION PANEL
ALT: 19 U/L (ref 7–35)
AST: 21 (ref 13–35)
Alkaline Phosphatase: 66 (ref 25–125)
Bilirubin, Total: 0.5

## 2023-03-12 LAB — BASIC METABOLIC PANEL
BUN: 18 (ref 4–21)
CO2: 31 — AB (ref 13–22)
Chloride: 105 (ref 99–108)
Creatinine: 1.1 (ref 0.5–1.1)
Glucose: 98
Potassium: 4.6 meq/L (ref 3.5–5.1)
Sodium: 140 (ref 137–147)

## 2023-03-12 LAB — CBC AND DIFFERENTIAL
HCT: 40 (ref 36–46)
Hemoglobin: 13.3 (ref 12.0–16.0)
Platelets: 261 10*3/uL (ref 150–400)
WBC: 6.7

## 2023-03-12 LAB — COMPREHENSIVE METABOLIC PANEL
Albumin: 4 (ref 3.5–5.0)
Calcium: 10 (ref 8.7–10.7)
eGFR: 49

## 2023-03-12 LAB — CBC: RBC: 4.3 (ref 3.87–5.11)

## 2023-03-12 MED ORDER — METFORMIN HCL 500 MG PO TABS
250.0000 mg | ORAL_TABLET | Freq: Two times a day (BID) | ORAL | 0 refills | Status: DC
Start: 2023-03-12 — End: 2023-04-09

## 2023-03-12 NOTE — Progress Notes (Signed)
.smr  Office: [phone]  /  Fax: [phone]  WEIGHT SUMMARY AND BIOMETRICS  Anthropometric Measurements Height: 4\' 11"  (1.499 m) Weight: 140 lb (63.5 kg) BMI (Calculated): 28.26 Weight at Last Visit: 138 lb Weight Lost Since Last Visit: 0 Weight Gained Since Last Visit: 2 lb Starting Weight: 147 lb Total Weight Loss (lbs): 7 lb (3.175 kg)   Body Composition  Body Fat %: 41.5 % Fat Mass (lbs): 58.4 lbs Muscle Mass (lbs): 78.2 lbs Total Body Water (lbs): 58 lbs Visceral Fat Rating : 11   Other Clinical Data Fasting: No Labs: No Today's Visit #: 20 Starting Date: 08/03/21    Chief Complaint: OBESITY    History of Present Illness   The patient, diagnosed with prediabetes and obesity, presents with concerns about her weight management and prediabetes control. She reports a weight gain of two pounds over the past month despite efforts to maintain a food journal with a daily calorie goal of 1000 to 1200 and a protein intake of at least 75 grams. The patient admits to only adhering to this regimen approximately 50% of the time. She engages in a combination of aerobic, cardio, and strengthening exercises for about 15 minutes, three times per week.  The patient is currently on metformin for prediabetes management, but her last hemoglobin A1c was elevated at 6.1. She expresses frustration with her lack of progress despite her efforts over the past year. She reports not feeling hungry but believes she may not be consuming enough protein. She also expresses feelings of defeat and frustration with her body's resistance to weight loss.  The patient also reports a recent history of leg inflammation, which she believes may have contributed to her elevated A1c levels. She mentions that the inflammation has been present for about three months and is still slightly noticeable. She has adjusted her exercise regimen to avoid exacerbating the inflammation.  The patient is also on a  half-dose of metformin taken twice daily, which she reports has reduced gastrointestinal issues, although she still experiences occasional bouts of diarrhea. She also reports frequent urination due to interstitial cystitis. She is making efforts to hydrate well and is mindful of her sodium intake, often opting for foods with extra protein and rinsing her vegetables and soups to reduce sodium content.  Despite these efforts, the patient expresses frustration with her weight, particularly around her midsection, which she attributes to her prediabetes. She also expresses concern about her job security, which may be contributing to her overall stress and frustration.          PHYSICAL EXAM:  Blood pressure 134/80, pulse 68, temperature (!) 97.4 F (36.3 C), height 4\' 11"  (1.499 m), weight 140 lb (63.5 kg), SpO2 100%. Body mass index is 28.28 kg/m.  DIAGNOSTIC DATA REVIEWED:  BMET    Component Value Date/Time   NA 141 12/11/2022 1056   K 4.2 12/11/2022 1056   CL 101 12/11/2022 1056   CO2 23 12/11/2022 1056   GLUCOSE 92 12/11/2022 1056   GLUCOSE 93 02/28/2021 1050   BUN 18 12/11/2022 1056   CREATININE 1.05 (H) 12/11/2022 1056   CALCIUM 9.9 12/11/2022 1056   GFRNONAA 44.4 09/11/2022 1437   GFRAA >60 12/04/2016 1351   Lab Results  Component Value Date   HGBA1C 6.1 (H) 12/11/2022   HGBA1C 6.1 (H) 08/03/2021   Lab Results  Component Value Date   INSULIN 6.3 12/11/2022   INSULIN 4.9 08/03/2021   Lab Results  Component Value Date   TSH  2.170 12/19/2021   CBC    Component Value Date/Time   WBC 7.0 05/01/2022 1158   WBC 7.7 08/12/2017 1211   RBC 4.40 05/01/2022 1158   RBC 4.34 08/12/2017 1211   HGB 13.6 05/01/2022 1158   HCT 41.0 05/01/2022 1158   PLT 259 05/01/2022 1158   MCV 93 05/01/2022 1158   MCH 30.9 05/01/2022 1158   MCH 31.6 12/04/2016 1351   MCHC 33.2 05/01/2022 1158   MCHC 34.0 08/12/2017 1211   RDW 14.2 05/01/2022 1158   Iron Studies No results found for:  "IRON", "TIBC", "FERRITIN", "IRONPCTSAT" Lipid Panel     Component Value Date/Time   CHOL 128 09/11/2022 0000   CHOL 164 05/01/2022 1158   TRIG 130 09/11/2022 0000   HDL 46 09/11/2022 0000   HDL 66 05/01/2022 1158   LDLCALC 56 09/11/2022 0000   LDLCALC 73 05/01/2022 1158   Hepatic Function Panel     Component Value Date/Time   PROT 6.5 12/11/2022 1056   ALBUMIN 4.4 12/11/2022 1056   AST 26 12/11/2022 1056   ALT 22 12/11/2022 1056   ALKPHOS 98 12/11/2022 1056   BILITOT 0.4 12/11/2022 1056      Component Value Date/Time   TSH 2.170 12/19/2021 0920   Nutritional Lab Results  Component Value Date   VD25OH 72.2 12/11/2022   VD25OH 33.5 09/11/2022   VD25OH 48.2 05/01/2022     Assessment and Plan    Prediabetes Prediabetes with recent hemoglobin A1c of 6.1. Experiencing gastrointestinal side effects from metformin. Discussed challenges of weight loss due to insulin resistance and emphasized disease management over personal fault. Encouraged hydration and managing side effects. - Continue metformin, 500 mg, half tablet twice daily - Take first dose earlier in the day - Refill metformin prescription - Maintain a food journal with a calorie goal of 1000-1200 per day and at least 75 grams of protein - Regular exercise including aerobics, cardio, and strengthening exercises - Emphasize hydration and managing gastrointestinal side effects  Obesity Obesity with recent weight gain of two pounds. Struggling with consistency in dietary changes and exercise. Discussed portion control, healthier food choices, and reducing eating out. Emphasized consistency in habits and impact of prediabetes on weight gain. Plan to reassess in January. - Continue current exercise regimen (15 minutes, three times per week) - Emphasize portion control and healthier food choices - Reduce eating out and prepare more meals at home - Reassess progress in January  General Health Maintenance Efforts to  manage overall health, including reducing sodium intake and choosing healthier food options. Managing hydration and vitamin intake. Discussed importance of protein intake and reducing sodium. Encouraged continuation of vitamin D supplementation and consideration of reducing calcium supplementation due to difficulty swallowing. - Continue protein shakes and high-protein foods - Reduce sodium intake by rinsing canned vegetables and soups - Encourage drinking water and reducing diet sodas - Continue current vitamin D supplementation (one per day) - Consider reducing calcium supplementation to every other day  Follow-up - Reassess progress in January - Ensure sufficient metformin until next visit.        She was informed of the importance of frequent follow up visits to maximize her success with intensive lifestyle modifications for her multiple health conditions.    Quillian Quince, MD

## 2023-03-15 ENCOUNTER — Other Ambulatory Visit (HOSPITAL_COMMUNITY): Payer: Self-pay

## 2023-03-15 ENCOUNTER — Other Ambulatory Visit: Payer: Self-pay

## 2023-03-15 MED ORDER — MEPERIDINE HCL 25 MG/ML IJ SOLN
INTRAMUSCULAR | 0 refills | Status: DC
  Filled 2023-03-15: qty 25, 8d supply, fill #0

## 2023-03-18 ENCOUNTER — Other Ambulatory Visit (HOSPITAL_COMMUNITY): Payer: Self-pay

## 2023-03-19 ENCOUNTER — Other Ambulatory Visit (HOSPITAL_COMMUNITY): Payer: Self-pay

## 2023-03-19 DIAGNOSIS — M5030 Other cervical disc degeneration, unspecified cervical region: Secondary | ICD-10-CM | POA: Diagnosis not present

## 2023-03-19 DIAGNOSIS — I129 Hypertensive chronic kidney disease with stage 1 through stage 4 chronic kidney disease, or unspecified chronic kidney disease: Secondary | ICD-10-CM | POA: Diagnosis not present

## 2023-03-19 DIAGNOSIS — N3010 Interstitial cystitis (chronic) without hematuria: Secondary | ICD-10-CM | POA: Diagnosis not present

## 2023-03-19 DIAGNOSIS — K589 Irritable bowel syndrome without diarrhea: Secondary | ICD-10-CM | POA: Diagnosis not present

## 2023-03-19 DIAGNOSIS — M797 Fibromyalgia: Secondary | ICD-10-CM | POA: Diagnosis not present

## 2023-03-19 DIAGNOSIS — M8580 Other specified disorders of bone density and structure, unspecified site: Secondary | ICD-10-CM | POA: Diagnosis not present

## 2023-03-19 DIAGNOSIS — F39 Unspecified mood [affective] disorder: Secondary | ICD-10-CM | POA: Diagnosis not present

## 2023-03-19 DIAGNOSIS — N1831 Chronic kidney disease, stage 3a: Secondary | ICD-10-CM | POA: Diagnosis not present

## 2023-03-19 DIAGNOSIS — R7301 Impaired fasting glucose: Secondary | ICD-10-CM | POA: Diagnosis not present

## 2023-03-19 DIAGNOSIS — R0789 Other chest pain: Secondary | ICD-10-CM | POA: Diagnosis not present

## 2023-03-19 DIAGNOSIS — I700 Atherosclerosis of aorta: Secondary | ICD-10-CM | POA: Diagnosis not present

## 2023-03-19 DIAGNOSIS — E559 Vitamin D deficiency, unspecified: Secondary | ICD-10-CM | POA: Diagnosis not present

## 2023-03-22 DIAGNOSIS — N3010 Interstitial cystitis (chronic) without hematuria: Secondary | ICD-10-CM | POA: Diagnosis not present

## 2023-03-25 ENCOUNTER — Other Ambulatory Visit (HOSPITAL_COMMUNITY): Payer: Self-pay

## 2023-04-01 DIAGNOSIS — H04123 Dry eye syndrome of bilateral lacrimal glands: Secondary | ICD-10-CM | POA: Diagnosis not present

## 2023-04-01 DIAGNOSIS — E119 Type 2 diabetes mellitus without complications: Secondary | ICD-10-CM | POA: Diagnosis not present

## 2023-04-01 DIAGNOSIS — H5213 Myopia, bilateral: Secondary | ICD-10-CM | POA: Diagnosis not present

## 2023-04-01 DIAGNOSIS — Z961 Presence of intraocular lens: Secondary | ICD-10-CM | POA: Diagnosis not present

## 2023-04-09 ENCOUNTER — Telehealth (INDEPENDENT_AMBULATORY_CARE_PROVIDER_SITE_OTHER): Payer: Self-pay | Admitting: Family Medicine

## 2023-04-09 ENCOUNTER — Encounter (INDEPENDENT_AMBULATORY_CARE_PROVIDER_SITE_OTHER): Payer: Self-pay | Admitting: Family Medicine

## 2023-04-09 ENCOUNTER — Ambulatory Visit (INDEPENDENT_AMBULATORY_CARE_PROVIDER_SITE_OTHER): Payer: PPO | Admitting: Family Medicine

## 2023-04-09 VITALS — BP 116/76 | HR 65 | Temp 97.8°F | Ht 59.0 in | Wt 141.0 lb

## 2023-04-09 DIAGNOSIS — R7303 Prediabetes: Secondary | ICD-10-CM

## 2023-04-09 DIAGNOSIS — K580 Irritable bowel syndrome with diarrhea: Secondary | ICD-10-CM

## 2023-04-09 DIAGNOSIS — Z6830 Body mass index (BMI) 30.0-30.9, adult: Secondary | ICD-10-CM

## 2023-04-09 DIAGNOSIS — E669 Obesity, unspecified: Secondary | ICD-10-CM

## 2023-04-09 DIAGNOSIS — M5412 Radiculopathy, cervical region: Secondary | ICD-10-CM | POA: Diagnosis not present

## 2023-04-09 DIAGNOSIS — Z6828 Body mass index (BMI) 28.0-28.9, adult: Secondary | ICD-10-CM

## 2023-04-09 MED ORDER — METFORMIN HCL 500 MG PO TABS
250.0000 mg | ORAL_TABLET | Freq: Two times a day (BID) | ORAL | 2 refills | Status: DC
Start: 2023-04-09 — End: 2023-06-04

## 2023-04-09 MED ORDER — LANCETS MISC. MISC
1.0000 | Freq: Three times a day (TID) | 0 refills | Status: AC
Start: 2023-04-09 — End: 2023-05-09

## 2023-04-09 MED ORDER — LANCET DEVICE MISC
1.0000 | Freq: Three times a day (TID) | 0 refills | Status: AC
Start: 2023-04-09 — End: 2023-05-09

## 2023-04-09 MED ORDER — BLOOD GLUCOSE MONITORING SUPPL DEVI
1.0000 | Freq: Once | 0 refills | Status: AC | PRN
Start: 2023-04-09 — End: ?

## 2023-04-09 MED ORDER — CYCLOBENZAPRINE HCL 10 MG PO TABS: 10.0000 mg | ORAL_TABLET | Freq: Three times a day (TID) | ORAL | 0 refills | Status: DC | PRN

## 2023-04-09 NOTE — Progress Notes (Signed)
.[facility]  Office: [phone]  /  Fax: [phone]  WEIGHT SUMMARY AND BIOMETRICS  Anthropometric Measurements Height: 4\' 11"  (1.499 m) Weight: 141 lb (64 kg) BMI (Calculated): 28.46 Weight at Last Visit: 140 lb Weight Lost Since Last Visit: 0 Weight Gained Since Last Visit: 1 lb Starting Weight: 147 lb Total Weight Loss (lbs): 6 lb (2.722 kg) Peak Weight: 151 lb   Body Composition  Body Fat %: 41.7 % Fat Mass (lbs): 58.8 lbs Muscle Mass (lbs): 78 lbs Total Body Water (lbs): 59 lbs Visceral Fat Rating : 11   Other Clinical Data Fasting: yes Labs: no Today's Visit #: 21 Starting Date: 08/03/21    Chief Complaint: OBESITY   Discussed the use of AI scribe software for clinical note transcription with the patient, who gave verbal consent to proceed.  History of Present Illness   The patient, with a history of obesity and prediabetes, presents for a follow-up visit. Her most recent hemoglobin A1c was 5.8, indicating a slight improvement in her prediabetic status. Over the past month, she has gained one pound, which she attributes to the recent Thanksgiving holiday. She has been attempting to manage her weight through dietary changes, aiming for a daily caloric intake of 1000-1275 calories with a focus on protein. However, she reports only achieving these goals about 50% of the time.  The patient has also been trying to increase her physical activity, but this has been limited due to leg problems. She has been experimenting with different foods and meals, including a smoothie recipe, but notes that she often needs to add sweeteners to make it palatable. She reports feeling bloated and has noticed fluctuations in her weight, which she believes may be due to water retention.  The patient also reports a history of irritable bowel syndrome, predominantly with diarrhea. She notes that this has continued despite being on metformin, and she believes the medication may be exacerbating her  abdominal pain. She has been taking half a pill of metformin twice a day, but expresses frustration with the number of pills she has to take daily for various conditions.  The patient is also concerned about her eating habits over the upcoming Christmas holiday, but she has made plans to order a pre-prepared meal to help control her intake. She expresses a desire for a quiet holiday season, without the stress of hosting large family gatherings.  The patient's recent labs show stable kidney function, and she reports that her eyes are in good condition with no signs of macular degeneration or retinopathy. She expresses motivation to continue managing her prediabetes and is interested in monitoring her blood sugar levels at home. She has requested a prescription for a glucose meter and strips.          PHYSICAL EXAM:  Blood pressure 116/76, pulse 65, temperature 97.8 F (36.6 C), height 4\' 11"  (1.499 m), weight 141 lb (64 kg), SpO2 98%. Body mass index is 28.48 kg/m.  DIAGNOSTIC DATA REVIEWED:  BMET    Component Value Date/Time   NA 141 12/11/2022 1056   K 4.2 12/11/2022 1056   CL 101 12/11/2022 1056   CO2 23 12/11/2022 1056   GLUCOSE 92 12/11/2022 1056   GLUCOSE 93 02/28/2021 1050   BUN 18 12/11/2022 1056   CREATININE 1.05 (H) 12/11/2022 1056   CALCIUM 9.9 12/11/2022 1056   GFRNONAA 44.4 09/11/2022 1437   GFRAA >60 12/04/2016 1351   Lab Results  Component Value Date   HGBA1C 6.1 (H) 12/11/2022  HGBA1C 6.1 (H) 08/03/2021   Lab Results  Component Value Date   INSULIN 6.3 12/11/2022   INSULIN 4.9 08/03/2021   Lab Results  Component Value Date   TSH 2.170 12/19/2021   CBC    Component Value Date/Time   WBC 7.0 05/01/2022 1158   WBC 7.7 08/12/2017 1211   RBC 4.40 05/01/2022 1158   RBC 4.34 08/12/2017 1211   HGB 13.6 05/01/2022 1158   HCT 41.0 05/01/2022 1158   PLT 259 05/01/2022 1158   MCV 93 05/01/2022 1158   MCH 30.9 05/01/2022 1158   MCH 31.6 12/04/2016 1351    MCHC 33.2 05/01/2022 1158   MCHC 34.0 08/12/2017 1211   RDW 14.2 05/01/2022 1158   Iron Studies No results found for: "IRON", "TIBC", "FERRITIN", "IRONPCTSAT" Lipid Panel     Component Value Date/Time   CHOL 128 09/11/2022 0000   CHOL 164 05/01/2022 1158   TRIG 130 09/11/2022 0000   HDL 46 09/11/2022 0000   HDL 66 05/01/2022 1158   LDLCALC 56 09/11/2022 0000   LDLCALC 73 05/01/2022 1158   Hepatic Function Panel     Component Value Date/Time   PROT 6.5 12/11/2022 1056   ALBUMIN 4.4 12/11/2022 1056   AST 26 12/11/2022 1056   ALT 22 12/11/2022 1056   ALKPHOS 98 12/11/2022 1056   BILITOT 0.4 12/11/2022 1056      Component Value Date/Time   TSH 2.170 12/19/2021 0920   Nutritional Lab Results  Component Value Date   VD25OH 72.2 12/11/2022   VD25OH 33.5 09/11/2022   VD25OH 48.2 05/01/2022     Assessment and Plan    Prediabetes Hemoglobin A1c is 5.8%. On metformin but experiencing diarrhea and abdominal pain, likely exacerbated by the medication. Metformin contributes approximately 20% to blood sugar control, with 80% due to lifestyle changes. Not regularly checking blood glucose levels. Discussed occasional postprandial glucose monitoring for personal edification. - Continue metformin - Refill metformin prescription - Prescribe blood glucose meter and strips  Obesity Gained one pound since the last visit, likely due to Thanksgiving. Journaling with a calorie goal of 1000-1200 calories and 75 grams of protein per day, meeting these goals 50% of the time. Minimal exercise due to leg problems. - Continue dietary journaling with focus on calorie and protein goals - Encourage increased physical activity as tolerated  Irritable Bowel Syndrome (IBS)-D Alternating constipation and diarrhea, with diarrhea being more predominant. Metformin may be contributing to gastrointestinal symptoms. - Monitor gastrointestinal symptoms and adjust diet as needed  General Health  Maintenance Recent labs show well-managed kidney function. Recent eye exam showed no signs of macular degeneration or retinal issues. - Continue regular eye exams - Monitor kidney function with routine labs  Follow-up - Follow up in February 2025 - Contact the office if any issues arise before the next appointment.          She was informed of the importance of frequent follow up visits to maximize her success with intensive lifestyle modifications for her multiple health conditions.    Quillian Quince, MD

## 2023-04-09 NOTE — Telephone Encounter (Signed)
Patient is currently at the pharmacy. Pt states that her RXs are not there. Pt wants her Metformin and Blood Glucose meter sent to the pharmacy.Marland KitchenMarland Kitchen

## 2023-04-18 ENCOUNTER — Other Ambulatory Visit (INDEPENDENT_AMBULATORY_CARE_PROVIDER_SITE_OTHER): Payer: Self-pay

## 2023-04-19 DIAGNOSIS — N3010 Interstitial cystitis (chronic) without hematuria: Secondary | ICD-10-CM | POA: Diagnosis not present

## 2023-04-26 ENCOUNTER — Other Ambulatory Visit: Payer: Self-pay | Admitting: Psychiatry

## 2023-04-26 DIAGNOSIS — F411 Generalized anxiety disorder: Secondary | ICD-10-CM

## 2023-04-26 DIAGNOSIS — F4001 Agoraphobia with panic disorder: Secondary | ICD-10-CM

## 2023-05-10 DIAGNOSIS — N3010 Interstitial cystitis (chronic) without hematuria: Secondary | ICD-10-CM | POA: Diagnosis not present

## 2023-05-20 DIAGNOSIS — M5412 Radiculopathy, cervical region: Secondary | ICD-10-CM | POA: Diagnosis not present

## 2023-05-21 DIAGNOSIS — G5601 Carpal tunnel syndrome, right upper limb: Secondary | ICD-10-CM | POA: Diagnosis not present

## 2023-05-31 DIAGNOSIS — N3010 Interstitial cystitis (chronic) without hematuria: Secondary | ICD-10-CM | POA: Diagnosis not present

## 2023-06-03 ENCOUNTER — Encounter: Payer: Self-pay | Admitting: Nurse Practitioner

## 2023-06-03 ENCOUNTER — Ambulatory Visit: Payer: PPO | Admitting: Nurse Practitioner

## 2023-06-03 ENCOUNTER — Encounter (INDEPENDENT_AMBULATORY_CARE_PROVIDER_SITE_OTHER): Payer: Self-pay

## 2023-06-03 VITALS — BP 106/68 | HR 81 | Ht 59.0 in | Wt 146.0 lb

## 2023-06-03 DIAGNOSIS — K625 Hemorrhage of anus and rectum: Secondary | ICD-10-CM | POA: Diagnosis not present

## 2023-06-03 DIAGNOSIS — K644 Residual hemorrhoidal skin tags: Secondary | ICD-10-CM

## 2023-06-03 DIAGNOSIS — K580 Irritable bowel syndrome with diarrhea: Secondary | ICD-10-CM | POA: Diagnosis not present

## 2023-06-03 DIAGNOSIS — Z8719 Personal history of other diseases of the digestive system: Secondary | ICD-10-CM

## 2023-06-03 DIAGNOSIS — D2272 Melanocytic nevi of left lower limb, including hip: Secondary | ICD-10-CM | POA: Diagnosis not present

## 2023-06-03 DIAGNOSIS — K6289 Other specified diseases of anus and rectum: Secondary | ICD-10-CM | POA: Diagnosis not present

## 2023-06-03 DIAGNOSIS — B078 Other viral warts: Secondary | ICD-10-CM | POA: Diagnosis not present

## 2023-06-03 DIAGNOSIS — L821 Other seborrheic keratosis: Secondary | ICD-10-CM | POA: Diagnosis not present

## 2023-06-03 DIAGNOSIS — L720 Epidermal cyst: Secondary | ICD-10-CM | POA: Diagnosis not present

## 2023-06-03 DIAGNOSIS — L820 Inflamed seborrheic keratosis: Secondary | ICD-10-CM | POA: Diagnosis not present

## 2023-06-03 DIAGNOSIS — D2271 Melanocytic nevi of right lower limb, including hip: Secondary | ICD-10-CM | POA: Diagnosis not present

## 2023-06-03 DIAGNOSIS — Z719 Counseling, unspecified: Secondary | ICD-10-CM

## 2023-06-03 NOTE — Progress Notes (Signed)
06/03/2023 Brenda [name] [id number] 07-Mar-1951   Chief Complaint: Rectal bleeding, hemorrhoids  History of Present Illness: Brenda [name] is a 73 year old female with a past medical history of anxiety, hypertension, hyperlipidemia, CVA 1996, interstitial cystitis, CKD stage III, IBS, anal fissure and external hemorrhoids.  She is known by Dr. Leone Payor.  She presents today for further evaluation regarding hemorrhoidal pain and bleeding which has occurred intermittently over the past 3 weeks.  She describes seeing a small amount of bright red blood on the toilet tissue after wiping which started after she passed a hard stool with straining.  She typically has IBS-D, passes 1-6 loose stools most days which she attributes to being on Metformin.  She contacted her PCP who prescribed Anusol suppositories which she took for 5 nights and Hydrocortisone cream.  She described having significant rectal pain and swelling, she stated she felt like she had a "testicle" to her anal area.  She stated her recent rectal pain was worse than the pain she experienced when she had an anal fissure.  Her hemorrhoidal swelling has decreased but has not completely abated.  Rectal bleeding has decreased.  She purchased Metamucil but has not started it.  Her most recent colonoscopy was 04/10/2016 which showed hypertrophied anal papilla and perianal skin tags, diverticulosis in the sigmoid colon and colon biopsies were negative for microscopic colitis.  Recall colonoscopy due 03/2026.  Previously diagnosed with an anal fissure 04/2021.  She was last seen in office by Dr. Leone Payor 07/24/2021 and noted her anal fissure pain and bleeding improved and she had intermittent chronic right lower quadrant abdominal pain. father had colon polyps.  Colon cancer in cousin and maternal grandmother.  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  She continues to work 1 or 2 days weekly.     Latest Ref Rng & Units 03/12/2023    12:00 AM 05/01/2022   11:58 AM 02/28/2021   10:50 AM  CBC  WBC  6.7     7.0    Hemoglobin 12.0 - 16.0 13.3     13.6  15.3   Hematocrit 36 - 46 40     41.0  45.0   Platelets 150 - 400 K/uL 261     259       This result is from an external source.        Latest Ref Rng & Units 03/12/2023   12:00 AM 12/11/2022   10:56 AM 09/11/2022   12:00 AM  CMP  Glucose 70 - 99 mg/dL  92    BUN 4 - 21 18     18  21       Creatinine 0.5 - 1.1 1.1     1.05  1.2      Sodium 137 - 147 140     141  136      Potassium 3.5 - 5.1 mEq/L 4.6     4.2  4.4      Chloride 99 - 108 105     101  100      CO2 13 - 22 31     23  27       Calcium 8.7 - 10.7 10.0     9.9  9.1      Total Protein 6.0 - 8.5 g/dL  6.5    Total Bilirubin 0.0 - 1.2 mg/dL  0.4    Alkaline Phos 25 - 125 66     98  91      AST 13 - 35 21     26  22       ALT 7 - 35 U/L 19     22  17          This result is from an external source.    PAST GI PROCEDURES:  EGD 04/10/2016:  - A few gastric polyps. Biopsied. - The examination was otherwise normal. - Biopsies were taken with a cold forceps for histology in the first portion of the duodenum and in the second portion of the duodenum. ? Olmesartan enteropathy  Colonoscopy 04/10/2016: - Hypertrophied anal papilla(e) and perianal skin tags found on perianal exam.  - The examined portion of the ileum was normal.  - Diverticulosis in the sigmoid colon.  - The examination was otherwise normal on direct and retroflexion views.  - Biopsies were taken with a cold forceps from the entire colon for evaluation of microscopic colitis.  -Recall colonoscopy 03/2026  1. Surgical [P], first and 2nd portion of duodenum BX - BENIGN DUODENAL MUCOSA. - NO EVIDENCE OF VILLOUS BLUNTING, SIGNIFICANT INFLAMMATION, DYSPLASIA OR MALIGNANCY. 2. Surgical [P], gastric fundus polyps - EARLY FUNDIC GLAND POLYP IN A SETTING OF MILD CHRONIC GASTRITIS. - A WARTHIN-STARRY STAIN IS NEGATIVE FOR HELICOBACTER PYLORI. - NEGATIVE  FOR INTESTINAL METAPLASIA OR MALIGNANCY. 3. Surgical [P], random sites - BENIGN COLONIC MUCOSA WITH INCIDENTAL SMALL SUBMUCOSAL CAPILLARY HEMANGIOMA. - NO EVIDENCE OF LYMPHOCYTIC OR COLLAGENOUS COLITIS, INFLAMMATORY BOWEL DISEASE OR MALIGNANCY  Past Medical History:  Diagnosis Date   Bilateral carotid bruits    followed by cardiologist   Bladder pain    Cervical disc disease    Chronic cough    followed by dr wert and ENT dr Harriette Ohara   Chronic kidney disease (CKD), stage III (moderate) (HCC)    Chronic seasonal allergic rhinitis    Dyspnea    with exertion    Fibromyalgia    Frequency of urination    GAD (generalized anxiety disorder)    GERD (gastroesophageal reflux disease)    Heart murmur    cardiologist--- dr croitoru   History of pancreatitis 1988-1989   History of panic attacks    History of stroke 1996   mild without residuals;  per MRI imaging 2010 left thalamic infarct without residual   Hyperlipidemia, mixed    Interstitial cystitis    urologist-- dr Annabell Howells   Irritable bowel syndrome with both constipation and diarrhea    followed by dr Leone Payor   Labile hypertension    followed by pcp;   hx normal nuclear stress test in 2009;   coronary CT 11-23-2020  normal coronaies, calcium score zero   Lumbar spondylosis    MDD (major depressive disorder)    Migraines    Moderate persistent asthma    followed by pcp and dr wert    Multiple pulmonary nodules    pulmonologist-- dr wert   Osteoarthritis    Palpitations    Panic disorder with agoraphobia    PONV (postoperative nausea and vomiting)    Pre-diabetes    Spasm of sphincter of Oddi 1987   02-13-2019  per pt no issues since she watches what medication's she takes   Urethral stenosis    s/p multiple dilatations   Urgency of urination    Vitamin D deficiency    Wears glasses    Past Surgical History:  Procedure Laterality Date   ABDOMINAL HYSTERECTOMY  1988   COMPLETE   APPENDECTOMY  07/1984  bilateral reduction mastopexy  1988   bilateral turbinate resection  1990   nasal spreading graft   BREAST CYST EXCISION Bilateral 03/18/2019   Procedure: excision necrotic breast tissue left ;  Surgeon: Peggye Form, DO;  Location: [facility];  Service: Plastics;  Laterality: Bilateral;   BREAST CYST EXCISION Bilateral 06/30/2020   Procedure: Excision of right breast fat necrosis and left inferior breast;  Surgeon: Peggye Form, DO;  Location: [facility];  Service: Plastics;  Laterality: Bilateral;   BREAST REDUCTION SURGERY  10/17/2018   Procedure: bilateral breast reduction;  Surgeon: Peggye Form, DO;  Location: [facility];  Service: Plastics;;  please adjust time to reflect 4 hours   CATARACT EXTRACTION W/ INTRAOCULAR LENS  IMPLANT, BILATERAL  08 and 09/ 2017   CHOLECYSTECTOMY  07/1984   COLONOSCOPY  02/2004, 11/2010, LAST 03-2016 ENDO DONE ALSO   2005: Normal - Dr. Sherin Quarry 2012: Normal   CYSTO WITH HYDRODISTENSION N/A 12/17/2013   Procedure: CYSTO HYDRODISTENSION WITH INSTALLATION OF CHLOROPACTIN AND TETRACAINE;  Surgeon: Anner Crete, MD;  Location: [facility];  Service: Urology;  Laterality: N/A;   CYSTO WITH HYDRODISTENSION N/A 06/02/2015   Procedure: CYSTOSCOPY/HYDRODISTENSION WITH INSTILLATION OF CLORPACTIN ;  Surgeon: Bjorn Pippin, MD;  Location: [facility];  Service: Urology;  Laterality: N/A;   CYSTO WITH HYDRODISTENSION N/A 11/08/2016   Procedure: CYSTOSCOPY/HYDRODISTENSION INSTILLATION OF CLORPACTIN;  Surgeon: Bjorn Pippin, MD;  Location: [facility];  Service: Urology;  Laterality: N/A;   CYSTO WITH HYDRODISTENSION N/A 03/06/2018   Procedure: CYSTOSCOPY/HYDRODISTENSION INSTILL CHLORPACTION;  Surgeon: Bjorn Pippin, MD;  Location: [facility];  Service: Urology;  Laterality: N/A;   CYSTO WITH HYDRODISTENSION N/A 04/02/2019   Procedure:  CYSTOSCOPY/HYDRODISTENSION INSTILL CHLORPACTIN;  Surgeon: Bjorn Pippin, MD;  Location: [facility];  Service: Urology;  Laterality: N/A;   CYSTO WITH HYDRODISTENSION N/A 02/17/2020   Procedure: CYSTOSCOPY/HYDRODISTENTION WITH INSTILLATION OF CHLORPACTIN;  Surgeon: Bjorn Pippin, MD;  Location: [facility];  Service: Urology;  Laterality: N/A;  ONLY NEEDS 30 MIN   CYSTO WITH HYDRODISTENSION N/A 02/28/2021   Procedure: CYSTOSCOPY/HYDRODISTENSION INSTILL CLORPACTION;  Surgeon: Bjorn Pippin, MD;  Location: [facility];  Service: Urology;  Laterality: N/A;   CYSTO WITH HYDRODISTENSION N/A 05/04/2022   Procedure: CYSTOSCOPY/HYDRODISTENSION INSTILL CLORPACTIN AND TETRACAINE;  Surgeon: Bjorn Pippin, MD;  Location: [facility];  Service: Urology;  Laterality: N/A;   CYSTOSCOPY WITH URETHRAL DILATATION  07/12/2011   Procedure: CYSTOSCOPY WITH URETHRAL DILATATION;  Surgeon: Anner Crete, MD;  Location: [facility];  Service: Urology;  Laterality: N/A;  HOD AND INSTILLATION OF CHLOROPACTIN C-ARM    CYSTOSCOPY WITH URETHRAL DILATATION N/A 06/26/2012   Procedure: CYSTOSCOPY WITH URETHRAL DILATATION HYDRODISTENSION AND INSTILLATION OF CLORPACTION ;  Surgeon: Anner Crete, MD;  Location: [facility];  Service: Urology;  Laterality: N/A;  CYSTOSCOPY WITH URETHRAL DILATATION HYDRODISTENSION AND INSTILLATION OF CLORPACTION    CYSTOSTOMY W/ BLADDER BIOPSY  1983   DG BARIUM SWALLOW ([facility] HX)  03/2016   DILATION AND CURETTAGE OF UTERUS  1987   ERCP  1988   for pancreatitis with stents   S/P SEVERE PANCREATITIS   HEMORRHOID SURGERY     LASIK Bilateral 07/2003   LIPOSUCTION Bilateral 10/17/2018   Procedure: LIPOSUCTION;  Surgeon: Peggye Form, DO;  Location: [facility];  Service: Plastics;  Laterality: Bilateral;   LIPOSUCTION Right  03/18/2019   Procedure: excision right necrotic breast tissue with liposuction;   Surgeon: Peggye Form, DO;  Location: [facility];  Service: Plastics;  Laterality: Right;   LIPOSUCTION Bilateral 06/30/2020   Procedure: Bilateral liposuction of lateral breast;  Surgeon: Peggye Form, DO;  Location: [facility];  Service: Plastics;  Laterality: Bilateral;   MULTIPLE CYSTO/ HOD/ URETHRAL DILATION/ INSTILLATION CLORPACTIN  .LAST ONE 03-31-2010   NASAL SINUS SURGERY  1977   sinus cyst   REDUCTION MAMMAPLASTY Bilateral 1988   REDUCTION MAMMAPLASTY Bilateral 09/2018   REDUCTION MAMMAPLASTY Bilateral 03/2019   revision rhinoplasty  1978   RHINOPLASTY  1977   RIGHT SALPINGOOPHECTOMY  1998   TONSILLECTOMY AND ADENOIDECTOMY  1963   VIDEO BRONCHOSCOPY Bilateral 08/23/2016   Procedure: VIDEO BRONCHOSCOPY WITHOUT FLUORO;  Surgeon: Roslynn Amble, MD;  Location: [facility];  Service: Cardiopulmonary;  Laterality: Bilateral;   Current Outpatient Medications on File Prior to Visit  Medication Sig Dispense Refill   acetaminophen (TYLENOL) 325 MG tablet Take 500 mg by mouth every 6 (six) hours as needed (for headaches).      albuterol (VENTOLIN HFA) 108 (90 Base) MCG/ACT inhaler Inhale into the lungs every 6 (six) hours as needed for wheezing or shortness of breath.     AMBULATORY NON FORMULARY MEDICATION Medication Name: Diltiazem 2% gel mixed with Lidocaine 5% Apply a pea size amount to rectum 2-3 times daily. (Patient taking differently: Apply topically as needed. Medication Name: Diltiazem 2% gel mixed with Lidocaine 5% Apply a pea size amount to rectum 2-3 times daily.) 30 g 2   Blood Glucose Monitoring Suppl DEVI 1 each by Does not apply route once as needed for up to 1 dose. May substitute to any manufacturer covered by patient's insurance. 1 each 0   Cholecalciferol (VITAMIN D3) 50 MCG (2000 UT) capsule Take 2 capsules (4,000 Units total) by mouth daily.     conjugated estrogens (PREMARIN) vaginal cream Place 1 applicator vaginally  daily.     cyclobenzaprine (FLEXERIL) 10 MG tablet Take 1 tablet (10 mg total) by mouth 3 (three) times daily as needed. 30 tablet 0   ezetimibe (ZETIA) 10 MG tablet Take 10 mg by mouth every evening.     famotidine (PEPCID) 20 MG tablet Take 20 mg by mouth at bedtime as needed.     FLUoxetine (PROZAC) 10 MG capsule Take 1 capsule (10 mg total) by mouth daily. 90 capsule 3   fluticasone (FLONASE) 50 MCG/ACT nasal spray Place 2 sprays into both nostrils daily as needed.     folic acid (FOLVITE) 1 MG tablet Take 1 mg by mouth daily.     LORazepam (ATIVAN) 1 MG tablet TAKE 1 TABLET EVERY 8 HOURS AS NEEDED FOR ANXIETY. 90 tablet 2   losartan (COZAAR) 100 MG tablet Take 100 mg by mouth daily.     meperidine (DEMEROL) 25 MG/ML injection every 30 (thirty) days. Every 4 weeks     meperidine (DEMEROL) 25 MG/ML injection Inject 3 mls intramusculary daily as needed for use prior to bladder instillation 25 mL 0   metFORMIN (GLUCOPHAGE) 500 MG tablet Take 0.5 tablets (250 mg total) by mouth 2 (two) times daily with a meal. 30 tablet 2   metoprolol (LOPRESSOR) 50 MG tablet Take 25 mg by mouth 2 (two) times daily.     Multiple Vitamins-Minerals (HAIR SKIN AND NAILS FORMULA PO) Take 1 tablet by mouth daily.     Omega-3 Fatty Acids (OMEGA-3 FISH  OIL PO) Omega 3     pantoprazole (PROTONIX) 40 MG tablet Take 40 mg by mouth daily.     Probiotic Product (PROBIOTIC ADVANCED PO) Probiotic     REPATHA SURECLICK 140 MG/ML SOAJ Inject SQ into the abdomen every 2 weeks Pt reports taking every 2 weeks on 02/10/20     zolpidem (AMBIEN) 5 MG tablet TAKE 1 TABLET AT BEDTIME AS NEEDED FOR INSOMNIA. 30 tablet 5   No current facility-administered medications on file prior to visit.   Allergies  Allergen Reactions   Codeine Other (See Comments)    SEVERE ELEVATED LIVER ENZYMES  Other Reaction: ELEVATED LIVER ENZYMES  Also all derivatives causes severe elevated liver enzymes   SEVERE ELEVATED LIVER ENZYMES   Latex Rash    Stadol [Butorphanol Tartrate] Swelling    Lips numb, tongue swell, tachycardia   Adhesive [Tape] Other (See Comments)    TEARS SKIN; PLEASE USE COBAN WRAP!!   Bupivacaine Other (See Comments)    Other Reaction: PRE-SYNCOPE, TACTYCARDIA TACHYCARDIA/ PRESYNCOPE   Butorphanol Tartrate Other (See Comments)    NUMBNESS, TACHYCARDIA   Clarithromycin Nausea Only and Other (See Comments)    TACHYCARDIA   Cymbalta [Duloxetine Hcl] Other (See Comments)    Blurred vision   Elavil [Amitriptyline Hcl] Other (See Comments)    Elevated liver enzymes   Hydrocodone Other (See Comments)    Elevated liver enzymes   Hydromorphone Hcl Itching   Levbid [Hyoscyamine Sulfate] Other (See Comments)    Dizziness, nausea, headache   Lisinopril Cough    Dry cough   Pentazocine Lactate Other (See Comments)    Tachycardia    Percodan [Oxycodone-Aspirin] Nausea Only   Trazodone And Nefazodone Other (See Comments)    Tachycardia    Vistaril [Hydroxyzine Hcl] Other (See Comments)    Altered sensorium   Morphine And Codeine Itching and Rash    Elevate liver enzymes   Current Medications, Allergies, Past Medical History, Past Surgical History, Family History and Social History were reviewed in [employer] record.  Review of Systems:   Constitutional: Negative for fever, sweats, chills or weight loss.  Respiratory: Negative for shortness of breath.   Cardiovascular: Negative for chest pain, palpitations and leg swelling.  Gastrointestinal: See HPI.  Musculoskeletal: Negative for back pain or muscle aches.  Neurological: Negative for dizziness, headaches or paresthesias.   Physical Exam: BP 106/68   Pulse 81   Ht 4\' 11"  (1.499 m)   Wt 146 lb (66.2 kg)   BMI 29.49 kg/m   General: 73 year old female in no acute distress. Head: Normocephalic and atraumatic. Eyes: No scleral icterus. Conjunctiva pink . Ears: Normal auditory acuity. Mouth: Dentition intact. No ulcers or  lesions.  Lungs: Clear throughout to auscultation. Heart: Regular rate and rhythm, no murmur. Abdomen: Soft, nontender and nondistended. No masses or hepatomegaly. Normal bowel sounds x 4 quadrants.  Rectal: Circumferential external hemorrhoids moderately inflamed without thrombosis, posterior hypertrophied papilla with small ulcerated area at the base without visible or palpable fissure within reach.  Anal sphincter was tight, limited internal exam with gloved pinky finger. Musculoskeletal: Symmetrical with no gross deformities. Extremities: No edema. Neurological: Alert oriented x 4. No focal deficits.  Psychological: Alert and cooperative. Normal mood and affect  Assessment and Recommendations:  73 year old female with rectal bleeding and rectal pain, likely due to external hemorrhoids and inflamed posterior anal papilla -Apply a small amount of Desitin inside the anal opening and to the external anal area three times daily as  needed for anal or hemorrhoidal irritation/bleeding.  -If no improvement will prescribe Diltiazem/Lidocaine fissure ointment for possible recurrent anal fissure -Warm soaks/sitz bath 10 minutes twice daily x 7 days -Metamucil daily as tolerated -If constipation develops, use MiraLAX nightly as needed  IBS-D - Metamucil as noted above -Contact PCP to consider changing metformin to extended release  Colon cancer screening. Colonoscopy was 04/10/2016 showed hypertrophied anal papilla and perianal skin tags, diverticulosis in the sigmoid colon and colon biopsies were negative for microscopic colitis.  -Next colonoscopy due 03/2016.  Dr. Leone Payor to verify if earlier colonoscopy warranted secondary rectal bleeding/pain as noted above.

## 2023-06-03 NOTE — Patient Instructions (Addendum)
Metamucil- take daily   Miralax- take every night as needed  Follow up with your primary care provider regarding switching Metformin to extended release.  Warm soaks/Sitz baths for 10 minutes twice daily for 7 days.  Apply a small amount of Desitin inside the anal opening and to the external anal area three times daily as needed for anal or hemorrhoidal irritation/bleeding.   Due to recent changes in healthcare laws, you may see the results of your imaging and laboratory studies on MyChart before your provider has had a chance to review them.  We understand that in some cases there may be results that are confusing or concerning to you. Not all laboratory results come back in the same time frame and the provider may be waiting for multiple results in order to interpret others.  Please give [place] 48 hours in order for your provider to thoroughly review all the results before contacting the office for clarification of your results.   Thank you for trusting me with your gastrointestinal care!   Alcide Evener, CRNP

## 2023-06-04 ENCOUNTER — Ambulatory Visit (INDEPENDENT_AMBULATORY_CARE_PROVIDER_SITE_OTHER): Payer: PPO | Admitting: Family Medicine

## 2023-06-04 ENCOUNTER — Encounter (INDEPENDENT_AMBULATORY_CARE_PROVIDER_SITE_OTHER): Payer: Self-pay | Admitting: Family Medicine

## 2023-06-04 VITALS — BP 116/74 | HR 65 | Ht 59.0 in | Wt 141.0 lb

## 2023-06-04 DIAGNOSIS — N952 Postmenopausal atrophic vaginitis: Secondary | ICD-10-CM | POA: Diagnosis not present

## 2023-06-04 DIAGNOSIS — R7303 Prediabetes: Secondary | ICD-10-CM | POA: Diagnosis not present

## 2023-06-04 DIAGNOSIS — K645 Perianal venous thrombosis: Secondary | ICD-10-CM

## 2023-06-04 DIAGNOSIS — Z6829 Body mass index (BMI) 29.0-29.9, adult: Secondary | ICD-10-CM | POA: Diagnosis not present

## 2023-06-04 DIAGNOSIS — K5909 Other constipation: Secondary | ICD-10-CM

## 2023-06-04 DIAGNOSIS — Z01419 Encounter for gynecological examination (general) (routine) without abnormal findings: Secondary | ICD-10-CM | POA: Diagnosis not present

## 2023-06-04 DIAGNOSIS — Z6828 Body mass index (BMI) 28.0-28.9, adult: Secondary | ICD-10-CM | POA: Diagnosis not present

## 2023-06-04 DIAGNOSIS — E669 Obesity, unspecified: Secondary | ICD-10-CM | POA: Diagnosis not present

## 2023-06-04 DIAGNOSIS — K644 Residual hemorrhoidal skin tags: Secondary | ICD-10-CM

## 2023-06-04 MED ORDER — METFORMIN HCL 500 MG PO TABS: 500.0000 mg | ORAL_TABLET | Freq: Every day | ORAL | 0 refills | Status: DC

## 2023-06-04 NOTE — Progress Notes (Signed)
 .smr  Office: [phone]  /  Fax: [phone]  WEIGHT SUMMARY AND BIOMETRICS  Anthropometric Measurements Height: 4' 11 (1.499 m) Weight: 141 lb (64 kg) BMI (Calculated): 28.46 Weight at Last Visit: 141 lb Weight Lost Since Last Visit: 0 Weight Gained Since Last Visit: 0 Starting Weight: 147 lb Total Weight Loss (lbs): 6 lb (2.722 kg) Peak Weight: 151 lb   Body Composition  Body Fat %: 41 % Fat Mass (lbs): 58.2 lbs Muscle Mass (lbs): 79.4 lbs Total Body Water  (lbs): 58.4 lbs Visceral Fat Rating : 11   Other Clinical Data Fasting: Yes Labs: No Today's Visit #: 22 Starting Date: 08/03/21    Chief Complaint: OBESITY   History of Present Illness   Brenda [name] is a 73 year old female who presents for a follow-up visit.  She is managing her obesity and prediabetes with metformin  500 mg daily and requests a refill. She has maintained her weight over the holidays and intermittently journals her diet, consuming 1000 to 1200 calories and 75 or more grams of protein per day, achieving this about 40% of the time. She is not currently exercising.  She has been experiencing hemorrhoids for about three weeks and has been using cortisone cream for relief. She has a history of constipation and diarrhea, with a recent episode of constipation leading to a thrombosed external hemorrhoid with a skin tag. She uses Miralax and Metamucil to manage her symptoms.  She has a history of neck and back issues, having received a steroid injection for her neck and planning another for her back. She has not been able to attend physical therapy for her neck recently.  She monitors her blood sugar levels, noting a postprandial reading of 146 mg/dL after a meal. She is cautious about her diet, occasionally indulging in sweets, and is mindful of her blood sugar levels.          PHYSICAL EXAM:  Blood pressure 116/74, pulse 65, height 4' 11 (1.499 m), weight 141 lb (64 kg), SpO2 99%. Body  mass index is 28.48 kg/m.  DIAGNOSTIC DATA REVIEWED:  BMET    Component Value Date/Time   NA 140 03/12/2023 0000   K 4.6 03/12/2023 0000   CL 105 03/12/2023 0000   CO2 31 (A) 03/12/2023 0000   GLUCOSE 92 12/11/2022 1056   GLUCOSE 93 02/28/2021 1050   BUN 18 03/12/2023 0000   CREATININE 1.1 03/12/2023 0000   CREATININE 1.05 (H) 12/11/2022 1056   CALCIUM 10.0 03/12/2023 0000   GFRNONAA 44.4 09/11/2022 1437   GFRAA >60 12/04/2016 1351   Lab Results  Component Value Date   HGBA1C 6.1 (H) 12/11/2022   HGBA1C 6.1 (H) 08/03/2021   Lab Results  Component Value Date   INSULIN  6.3 12/11/2022   INSULIN  4.9 08/03/2021   Lab Results  Component Value Date   TSH 2.170 12/19/2021   CBC    Component Value Date/Time   WBC 6.7 03/12/2023 0000   WBC 7.7 08/12/2017 1211   RBC 4.3 03/12/2023 0000   HGB 13.3 03/12/2023 0000   HGB 13.6 05/01/2022 1158   HCT 40 03/12/2023 0000   HCT 41.0 05/01/2022 1158   PLT 261 03/12/2023 0000   PLT 259 05/01/2022 1158   MCV 93 05/01/2022 1158   MCH 30.9 05/01/2022 1158   MCH 31.6 12/04/2016 1351   MCHC 33.2 05/01/2022 1158   MCHC 34.0 08/12/2017 1211   RDW 14.2 05/01/2022 1158   Iron Studies No results found for: IRON,  TIBC, FERRITIN, IRONPCTSAT Lipid Panel     Component Value Date/Time   CHOL 128 09/11/2022 0000   CHOL 164 05/01/2022 1158   TRIG 130 09/11/2022 0000   HDL 46 09/11/2022 0000   HDL 66 05/01/2022 1158   LDLCALC 56 09/11/2022 0000   LDLCALC 73 05/01/2022 1158   Hepatic Function Panel     Component Value Date/Time   PROT 6.5 12/11/2022 1056   ALBUMIN 4.0 03/12/2023 0000   ALBUMIN 4.4 12/11/2022 1056   AST 21 03/12/2023 0000   ALT 19 03/12/2023 0000   ALKPHOS 66 03/12/2023 0000   BILITOT 0.4 12/11/2022 1056      Component Value Date/Time   TSH 2.170 12/19/2021 0920   Nutritional Lab Results  Component Value Date   VD25OH 72.2 12/11/2022   VD25OH 33.5 09/11/2022   VD25OH 48.2 05/01/2022      Assessment and Plan    Obesity Obesity with ongoing efforts to manage weight through dietary modifications. Journaling caloric intake (1000-1200 calories/day) and protein intake (75+ grams/day) successfully about 40% of the time. No current exercise regimen. Discussed the importance of hydration, especially with fiber intake. - Continue dietary journaling - Encourage regular physical activity - Emphasize hydration, especially with fiber intake  Prediabetes Prediabetes managed with metformin  500 mg daily. Occasional postprandial blood glucose checks show 146 mg/dL two hours after eating. Discussed potential switch to extended-release metformin  to mitigate GI side effects, though it may not aid in weight loss. Explained that constipation is a common side effect of weight loss and dietary changes. - Refill metformin  500 mg daily - Consider extended-release metformin  if GI issues persist - Continue monitoring blood glucose levels  Chronic Constipation Chronic constipation likely exacerbated by dietary changes aimed at weight loss. Discussed the role of slow transit constipation and the importance of regular fiber intake and hydration to prevent hard stools and subsequent hemorrhoids. Explained that Metamucil and Miralax can help prevent constipation and diarrhea by stabilizing peristalsis. - Use Metamucil daily - Use Miralax as needed - Increase fluid intake, including decaf coffee  Hemorrhoids External hemorrhoids with recent exacerbation likely due to chronic constipation and straining. Currently managed with topical corticosteroids. Discussed discontinuing steroid cream and the potential need for an earlier colonoscopy if symptoms persist. - Discontinue topical corticosteroids - Continue fiber supplements (Metamucil) and stool softeners (Miralax) to prevent straining - Consider earlier colonoscopy if symptoms persist  General Health Maintenance General health maintenance discussed,  including the importance of hydration and balanced diet. Taking a vitamin supplement that includes 2000 IU of vitamin D . - Continue current vitamin supplement - Plan for fasting labs at next visit to assess A1C post-holidays  Follow-up - Schedule fasting labs at next visit - Consider earlier colonoscopy if hemorrhoid symptoms persist.      She was informed of the importance of frequent follow up visits to maximize her success with intensive lifestyle modifications for her multiple health conditions.    Louann Penton, MD

## 2023-06-11 DIAGNOSIS — M5416 Radiculopathy, lumbar region: Secondary | ICD-10-CM | POA: Diagnosis not present

## 2023-06-17 ENCOUNTER — Other Ambulatory Visit: Payer: Self-pay | Admitting: Urology

## 2023-06-21 DIAGNOSIS — N3010 Interstitial cystitis (chronic) without hematuria: Secondary | ICD-10-CM | POA: Diagnosis not present

## 2023-07-09 ENCOUNTER — Ambulatory Visit (INDEPENDENT_AMBULATORY_CARE_PROVIDER_SITE_OTHER): Payer: PPO | Admitting: Family Medicine

## 2023-07-09 ENCOUNTER — Encounter (INDEPENDENT_AMBULATORY_CARE_PROVIDER_SITE_OTHER): Payer: Self-pay | Admitting: Family Medicine

## 2023-07-09 VITALS — BP 130/73 | HR 68 | Temp 97.4°F | Ht 59.0 in | Wt 139.0 lb

## 2023-07-09 DIAGNOSIS — E669 Obesity, unspecified: Secondary | ICD-10-CM

## 2023-07-09 DIAGNOSIS — E559 Vitamin D deficiency, unspecified: Secondary | ICD-10-CM | POA: Diagnosis not present

## 2023-07-09 DIAGNOSIS — Z6828 Body mass index (BMI) 28.0-28.9, adult: Secondary | ICD-10-CM

## 2023-07-09 DIAGNOSIS — E782 Mixed hyperlipidemia: Secondary | ICD-10-CM | POA: Diagnosis not present

## 2023-07-09 DIAGNOSIS — R7303 Prediabetes: Secondary | ICD-10-CM

## 2023-07-09 DIAGNOSIS — E785 Hyperlipidemia, unspecified: Secondary | ICD-10-CM | POA: Diagnosis not present

## 2023-07-09 DIAGNOSIS — Z789 Other specified health status: Secondary | ICD-10-CM

## 2023-07-09 DIAGNOSIS — I10 Essential (primary) hypertension: Secondary | ICD-10-CM | POA: Diagnosis not present

## 2023-07-09 NOTE — Progress Notes (Signed)
 Office: [phone]  /  Fax: [phone]  WEIGHT SUMMARY AND BIOMETRICS  Anthropometric Measurements Height: 4\' 11"  (1.499 m) Weight: 139 lb (63 kg) BMI (Calculated): 28.06 Weight at Last Visit: 141 lb Weight Lost Since Last Visit: 2 lb Weight Gained Since Last Visit: 0 Starting Weight: 147 lb Total Weight Loss (lbs): 8 lb (3.629 kg) Peak Weight: 151 lb   Body Composition  Body Fat %: 39.8 % Fat Mass (lbs): 55.4 lbs Muscle Mass (lbs): 79.8 lbs Total Body Water (lbs): 56.2 lbs Visceral Fat Rating : 11   Other Clinical Data Fasting: yes Labs: yes Today's Visit #: 23 Starting Date: 08/03/21    Chief Complaint: OBESITY   History of Present Illness   The patient presents to discuss her obesity treatment plan and monitor her progress.  She is following a prescribed diet of 1000 to 1200 calories with at least 75 grams of protein, achieving this about 70% of the time. She engages in additional physical activities for about ten minutes three times a week. She has lost two pounds since the last visit, indicating some progress in weight management.  She has a history of hypertension, currently controlled at 130/73 mmHg. She is on losartan 100 mg daily and metoprolol 50 mg twice a day.  For prediabetes, she takes metformin and is also focusing on exercise and weight loss. She experiences diarrhea, going to the bathroom six times in one day, which she suspects may be related to metformin or losartan.  For hyperlipidemia, she is on Zetia and Repatha, as she has statin intolerance due to liver issues. Her LDL levels have improved with this regimen. She mentions a family history of hyperlipidemia, as her sister is also on Repatha.  She takes over-the-counter vitamin D, 4000 IU daily, for vitamin D deficiency.          PHYSICAL EXAM:  Blood pressure 130/73, pulse 68, temperature (!) 97.4 F (36.3 C), height 4\' 11"  (1.499 m), weight 139 lb (63 kg), SpO2 99%. Body mass index  is 28.07 kg/m.  DIAGNOSTIC DATA REVIEWED:  BMET    Component Value Date/Time   NA 140 03/12/2023 0000   K 4.6 03/12/2023 0000   CL 105 03/12/2023 0000   CO2 31 (A) 03/12/2023 0000   GLUCOSE 92 12/11/2022 1056   GLUCOSE 93 02/28/2021 1050   BUN 18 03/12/2023 0000   CREATININE 1.1 03/12/2023 0000   CREATININE 1.05 (H) 12/11/2022 1056   CALCIUM 10.0 03/12/2023 0000   GFRNONAA 44.4 09/11/2022 1437   GFRAA >60 12/04/2016 1351   Lab Results  Component Value Date   HGBA1C 6.1 (H) 12/11/2022   HGBA1C 6.1 (H) 08/03/2021   Lab Results  Component Value Date   INSULIN 6.3 12/11/2022   INSULIN 4.9 08/03/2021   Lab Results  Component Value Date   TSH 2.170 12/19/2021   CBC    Component Value Date/Time   WBC 6.7 03/12/2023 0000   WBC 7.7 08/12/2017 1211   RBC 4.3 03/12/2023 0000   HGB 13.3 03/12/2023 0000   HGB 13.6 05/01/2022 1158   HCT 40 03/12/2023 0000   HCT 41.0 05/01/2022 1158   PLT 261 03/12/2023 0000   PLT 259 05/01/2022 1158   MCV 93 05/01/2022 1158   MCH 30.9 05/01/2022 1158   MCH 31.6 12/04/2016 1351   MCHC 33.2 05/01/2022 1158   MCHC 34.0 08/12/2017 1211   RDW 14.2 05/01/2022 1158   Iron Studies No results found for: "IRON", "TIBC", "FERRITIN", "IRONPCTSAT" Lipid  Panel     Component Value Date/Time   CHOL 128 09/11/2022 0000   CHOL 164 05/01/2022 1158   TRIG 130 09/11/2022 0000   HDL 46 09/11/2022 0000   HDL 66 05/01/2022 1158   LDLCALC 56 09/11/2022 0000   LDLCALC 73 05/01/2022 1158   Hepatic Function Panel     Component Value Date/Time   PROT 6.5 12/11/2022 1056   ALBUMIN 4.0 03/12/2023 0000   ALBUMIN 4.4 12/11/2022 1056   AST 21 03/12/2023 0000   ALT 19 03/12/2023 0000   ALKPHOS 66 03/12/2023 0000   BILITOT 0.4 12/11/2022 1056      Component Value Date/Time   TSH 2.170 12/19/2021 0920   Nutritional Lab Results  Component Value Date   VD25OH 72.2 12/11/2022   VD25OH 33.5 09/11/2022   VD25OH 48.2 05/01/2022     Assessment and  Plan    Obesity She is actively working on weight loss through a calorie-restricted diet of 1000 to 1200 calories with at least 75 grams of protein daily. Reports adherence to this plan about 70% of the time. Engages in physical activity beyond normal daily activities for about 10 minutes, three times a week. Successfully lost two pounds since the last visit. - Continue current diet and exercise regimen - Encourage increased adherence to dietary plan, specifically to keep a food journal  Prediabetes Prediabetes is being managed with metformin and lifestyle modifications including diet and exercise. Reports episodes of diarrhea, which may be related to metformin use. Discussed reducing metformin dosage to assess if diarrhea improves. - Reduce metformin dosage to assess if diarrhea improves - Monitor blood glucose levels - Order fasting labs including A1c and fasting glucose  Hypertension Hypertension is well-controlled with a blood pressure reading of 130/73 mmHg. She is on losartan 100 mg daily and metoprolol 50 mg twice a day. - Continue losartan 100 mg daily - Continue metoprolol 50 mg twice a day - Monitor blood pressure regularly  Hyperlipidemia Hyperlipidemia is managed with Zetia and Repatha due to statin intolerance and familial hyperlipidemia. Zetia alone was insufficient, and Repatha has been effective in lowering LDL levels. Statin intolerance is noted due to liver enzyme issues, necessitating the use of Repatha. - Continue Zetia - Continue Repatha - Order fasting lipid panel   Vitamin D deficiency Vitamin D deficiency is managed with over-the-counter vitamin D supplementation. She is currently taking 4000 IU daily. - Continue vitamin D 4000 IU daily - Order vitamin D level  Follow-up  - Schedule follow-up appointment in 6 to 8 weeks         I have personally spent 45 minutes total time today in preparation, patient care, and documentation for this visit, including the  following: review of clinical lab tests; review of medical tests/procedures/services.    She was informed of the importance of frequent follow up visits to maximize her success with intensive lifestyle modifications for her multiple health conditions.    Quillian Quince, MD

## 2023-07-10 LAB — CMP14+EGFR
ALT: 25 IU/L (ref 0–32)
AST: 21 IU/L (ref 0–40)
Albumin: 4.4 g/dL (ref 3.8–4.8)
Alkaline Phosphatase: 93 IU/L (ref 44–121)
BUN/Creatinine Ratio: 19 (ref 12–28)
BUN: 23 mg/dL (ref 8–27)
Bilirubin Total: 0.5 mg/dL (ref 0.0–1.2)
CO2: 20 mmol/L (ref 20–29)
Calcium: 9.9 mg/dL (ref 8.7–10.3)
Chloride: 103 mmol/L (ref 96–106)
Creatinine, Ser: 1.19 mg/dL — ABNORMAL HIGH (ref 0.57–1.00)
Globulin, Total: 2.2 g/dL (ref 1.5–4.5)
Glucose: 82 mg/dL (ref 70–99)
Potassium: 4.2 mmol/L (ref 3.5–5.2)
Sodium: 142 mmol/L (ref 134–144)
Total Protein: 6.6 g/dL (ref 6.0–8.5)
eGFR: 49 mL/min/{1.73_m2} — ABNORMAL LOW (ref >59)

## 2023-07-10 LAB — INSULIN, RANDOM: INSULIN: 17.1 u[IU]/mL (ref 2.6–24.9)

## 2023-07-10 LAB — HEMOGLOBIN A1C
Est. average glucose Bld gHb Est-mCnc: 134 mg/dL
Hgb A1c MFr Bld: 6.3 % — ABNORMAL HIGH (ref 4.8–5.6)

## 2023-07-10 LAB — LIPID PANEL WITH LDL/HDL RATIO
Cholesterol, Total: 154 mg/dL (ref 100–199)
HDL: 79 mg/dL (ref >39)
LDL Chol Calc (NIH): 59 mg/dL (ref 0–99)
LDL/HDL Ratio: 0.7 ratio (ref 0.0–3.2)
Triglycerides: 89 mg/dL (ref 0–149)
VLDL Cholesterol Cal: 16 mg/dL (ref 5–40)

## 2023-07-10 LAB — VITAMIN B12: Vitamin B-12: 938 pg/mL (ref 232–1245)

## 2023-07-10 LAB — VITAMIN D 25 HYDROXY (VIT D DEFICIENCY, FRACTURES): Vit D, 25-Hydroxy: 74.6 ng/mL (ref 30.0–100.0)

## 2023-07-12 DIAGNOSIS — N3010 Interstitial cystitis (chronic) without hematuria: Secondary | ICD-10-CM | POA: Diagnosis not present

## 2023-07-15 NOTE — Progress Notes (Signed)
 COVID Vaccine received:  []  No [x]  Yes Date of any COVID positive Test in last 90 days: no PCP - Chilton Greathouse MD Cardiologist - Thurmon Fair MD  Chest x-ray -  EKG -  07/16/23 Epic Stress Test -  ECHO - 11/10/20 Epic Cardiac Cath -   Bowel Prep - [x]  No  []   Yes ______  Pacemaker / ICD device [x]  No []  Yes   Spinal Cord Stimulator:[x]  No []  Yes       History of Sleep Apnea? [x]  No []  Yes   CPAP used?- [x]  No []  Yes    Does the patient monitor blood sugar?          []  No [x]  Yes  []  N/A  Patient has: []  NO Hx DM   [x]  Pre-DM                 []  DM1  []   DM2 Does patient have a [employer] or [employer]? []  No []  Yes   Fasting Blood Sugar Ranges- 90's Checks Blood Sugar ___3__ times a week  GLP1 agonist / usual dose - no GLP1 instructions:  SGLT-2 inhibitors / usual dose - no SGLT-2 instructions:   Blood Thinner / Instructions:no Aspirin Instructions:no  Comments:   Activity level: Patient is able  to climb a flight of stairs without difficulty; [x]  No CP  [x]  No SOB, _   Patient can perform ADLs without assistance.   Anesthesia review: HTN,CVA, Prediabetic, CKD, Murmur- has had ECHO  Patient denies shortness of breath, fever, cough and chest pain at PAT appointment.  Patient verbalized understanding and agreement to the Pre-Surgical Instructions that were given to them at this PAT appointment. Patient was also educated of the need to review these PAT instructions again prior to his/her surgery.I reviewed the appropriate phone numbers to call if they have any and questions or concerns.

## 2023-07-15 NOTE — Patient Instructions (Signed)
 SURGICAL WAITING ROOM VISITATION  Patients having surgery or a procedure may have no more than 2 support people in the waiting area - these visitors may rotate.    Children under the age of 85 must have an adult with them who is not the patient.  Due to an increase in RSV and influenza rates and associated hospitalizations, children ages 49 and under may not visit patients in [facility] hospitals.  Visitors with respiratory illnesses are discouraged from visiting and should remain at home.  If the patient needs to stay at the hospital during part of their recovery, the visitor guidelines for inpatient rooms apply. Pre-op nurse will coordinate an appropriate time for 1 support person to accompany patient in pre-op.  This support person may not rotate.    Please refer to the [facility] website for the visitor guidelines for Inpatients (after your surgery is over and you are in a regular room).       Your procedure is scheduled on: 07/19/23   Report to [facility] Main Entrance    Report to admitting at 9 AM   Call this number if you have problems the morning of surgery [phone]   Do not eat food  or drink liquids:After Midnight.but may have sips of water to take meds.      Oral Hygiene is also important to reduce your risk of infection.                                    Remember - BRUSH YOUR TEETH THE MORNING OF SURGERY WITH YOUR REGULAR TOOTHPASTE  DENTURES WILL BE REMOVED PRIOR TO SURGERY PLEASE DO NOT APPLY "Poly grip" OR ADHESIVES!!!      Stop all vitamins and herbal supplements 7 days before surgery.   Take these medicines the morning of surgery with A SIP OF WATER: Ezetimibe(zetia), Famotidine(pepcid), Fluoxetine(Prozac), Ativan if needed, Metoprolol, Pantoprazole(protonix), Tylenol if needed.  DO NOT TAKE ANY ORAL DIABETIC MEDICATIONS DAY OF YOUR SURGERY Hold Metformin the day of surgery             You may not have any metal on your body including hair  pins, jewelry, and body piercing             Do not wear make-up, lotions, powders, perfumes/cologne, or deodorant  Do not wear nail polish including gel and S&S, artificial/acrylic nails, or any other type of covering on natural nails including finger and toenails. If you have artificial nails, gel coating, etc. that needs to be removed by a nail salon please have this removed prior to surgery or surgery may need to be canceled/ delayed if the surgeon/ anesthesia feels like they are unable to be safely monitored.   Do not shave  48 hours prior to surgery.    Do not bring valuables to the hospital. [facility] IS NOT             RESPONSIBLE   FOR VALUABLES.   Contacts, glasses, dentures or bridgework may not be worn into surgery.   Bring small overnight bag day of surgery.   DO NOT BRING YOUR HOME MEDICATIONS TO THE HOSPITAL. PHARMACY WILL DISPENSE MEDICATIONS LISTED ON YOUR MEDICATION LIST TO YOU DURING YOUR ADMISSION IN THE HOSPITAL!    Patients discharged on the day of surgery will not be allowed to drive home.  Someone NEEDS to stay with you for the first  24 hours after anesthesia.   Special Instructions: Bring a copy of your healthcare power of attorney and living will documents the day of surgery if you haven't scanned them before.              Please read over the following fact sheets you were given: IF YOU HAVE QUESTIONS ABOUT YOUR PRE-OP INSTRUCTIONS PLEASE CALL [phone] Rosey [facility]   If you received a COVID test during your pre-op visit  it is requested that you wear a mask when out in public, stay away from anyone that may not be feeling well and notify your surgeon if you develop symptoms. If you test positive for Covid or have been in contact with anyone that has tested positive in the last 10 days please notify you surgeon.    [facility] - Preparing for Surgery Before surgery, you can play an important role.  Because skin is not sterile, your skin needs to be as free of  germs as possible.  You can reduce the number of germs on your skin by washing with CHG (chlorahexidine gluconate) soap before surgery.  CHG is an antiseptic cleaner which kills germs and bonds with the skin to continue killing germs even after washing. Please DO NOT use if you have an allergy to CHG or antibacterial soaps.  If your skin becomes reddened/irritated stop using the CHG and inform your nurse when you arrive at Short Stay. Do not shave (including legs and underarms) for at least 48 hours prior to the first CHG shower.  You may shave your face/neck.  Please follow these instructions carefully:  1.  Shower with CHG Soap the night before surgery and the  morning of surgery.  2.  If you choose to wash your hair, wash your hair first as usual with your normal  shampoo.  3.  After you shampoo, rinse your hair and body thoroughly to remove the shampoo.                             4.  Use CHG as you would any other liquid soap.  You can apply chg directly to the skin and wash.  Gently with a scrungie or clean washcloth.  5.  Apply the CHG Soap to your body ONLY FROM THE NECK DOWN.   Do   not use on face/ open                           Wound or open sores. Avoid contact with eyes, ears mouth and   genitals (private parts).                       Wash face,  Genitals (private parts) with your normal soap.             6.  Wash thoroughly, paying special attention to the area where your    surgery  will be performed.  7.  Thoroughly rinse your body with warm water from the neck down.  8.  DO NOT shower/wash with your normal soap after using and rinsing off the CHG Soap.                9.  Pat yourself dry with a clean towel.            10.  Wear clean pajamas.  11.  Place clean sheets on your bed the night of your first shower and do not  sleep with pets. Day of Surgery : Do not apply any lotions/deodorants the morning of surgery.  Please wear clean clothes to the hospital/surgery  center.  FAILURE TO FOLLOW THESE INSTRUCTIONS MAY RESULT IN THE CANCELLATION OF YOUR SURGERY  PATIENT SIGNATURE_________________________________  NURSE SIGNATURE__________________________________  ________________________________________________________________________

## 2023-07-16 ENCOUNTER — Encounter (HOSPITAL_COMMUNITY)
Admission: RE | Admit: 2023-07-16 | Discharge: 2023-07-16 | Disposition: A | Discharge status: Home / Self Care | Payer: PPO | Source: Ambulatory Visit | Attending: Urology | Admitting: Urology

## 2023-07-16 ENCOUNTER — Encounter (HOSPITAL_COMMUNITY): Payer: Self-pay

## 2023-07-16 ENCOUNTER — Other Ambulatory Visit: Payer: Self-pay

## 2023-07-16 VITALS — BP 127/77 | HR 69 | Temp 97.7°F | Resp 16 | Ht 59.5 in | Wt 142.0 lb

## 2023-07-16 DIAGNOSIS — R9431 Abnormal electrocardiogram [ECG] [EKG]: Secondary | ICD-10-CM | POA: Insufficient documentation

## 2023-07-16 DIAGNOSIS — Z01818 Encounter for other preprocedural examination: Secondary | ICD-10-CM | POA: Diagnosis not present

## 2023-07-16 DIAGNOSIS — I10 Essential (primary) hypertension: Secondary | ICD-10-CM | POA: Diagnosis not present

## 2023-07-16 LAB — CBC
HCT: 42.2 % (ref 36.0–46.0)
Hemoglobin: 13.6 g/dL (ref 12.0–15.0)
MCH: 30.6 pg (ref 26.0–34.0)
MCHC: 32.2 g/dL (ref 30.0–36.0)
MCV: 94.8 fL (ref 80.0–100.0)
Platelets: 305 10*3/uL (ref 150–400)
RBC: 4.45 MIL/uL (ref 3.87–5.11)
RDW: 14.1 % (ref 11.5–15.5)
WBC: 8.5 10*3/uL (ref 4.0–10.5)
nRBC: 0 % (ref 0.0–0.2)

## 2023-07-17 NOTE — H&P (Signed)
 I have interstitial cystitis.     Brenda [name] has increased pain with her IC and feels she needs cystoscopy with HOD with clorpactin which she has required many times in the past. Her recent UA is clear.     ALLERGIES: Codeine Derivatives - elevated liver enzymes Dilaudid TABS - Itching latex Lisinopril TABS - cough Marcaine SOLN - tachycardia and presyncope Morphine Derivatives - Itching, Hives, Spasm of sphincter of ODIE and pancreatitis Stadol SOLN - Swelling, tongue swelling and lips numb    MEDICATIONS: Metformin Hcl 500 mg tablet  Metoprolol Tartrate 50 mg tablet 1 tablet PO Daily  Flonase Allergy Relief  Folic Acid 1 mg tablet 1 tablet PO Daily  Hair, Skin And Nails  Lorazepam 1 mg tablet 1 tablet PO Daily  Losartan Potassium  Meperidine Hcl 25 mg/ml vial 3 ml IM Daily PRN for use prior to bladder instillation.  Probiotic 1 PO Daily  Prozac 10 mg capsule  Repatha Sureclick 140 mg/ml pen injector  Ventolin Hfa 1 PO Daily  Vitamin C 1 PO Daily  Vitamin D3  Zetia 10 mg tablet 1 tablet PO Daily     GU PSH: Cysto Bladder Ureth Biopsy - 2012 Cysto Dilate Stricture (M or F) - 2013 Cystoscopy Hydrodistention - 02/28/2021, 2021, 2020, 2019, 2018, 2013, 2011, 2010, 2010, 2008 D&C Non-OB - 1987 Hysterectomy Unilat SO - 1987 Ovary Removal Partial or Total - 2012 Subseq Female Urethral Dilation - 2022, 2019       PSH Notes: Bladder Irrigation- multiple(s)  Corneal LASIK Bilateral, bilateral breast reduction with mastopexy 1987 Dr. Stephens November     NON-GU PSH: Appendectomy - 2012 Breast Reduction, Bilateral - 2020, 1987 Cataract surgery, Bilateral Cholecystectomy (open) - 2012 Colonoscopy Diagnostic Colonoscopy - 2012 Endo Cholangiopancreatograph - 2012 Hemorrhoidectomy (favorite) - 2015 Needle Biopsy Liver - 2012 Neuroeltrd Stim Post Tibial - 2019, 2019, 2019, 2019, 2019, 2019, 2019, 2019, 2019, 2019, 2019, 2019 Nose Surgery (Unspecified) Remove Tonsils And Adenoids -  2012 Repair Septal Defect Suspend Breast - 1987     GU PMH: Interstitial Cystitis (w/o hematuria) - 05/31/2023, - 05/10/2023, - 04/19/2023, - 03/22/2023, - 03/01/2023, - 02/08/2023, - 01/11/2023, - 12/21/2022, - 11/23/2022, - 11/02/2022, - 10/04/2022, - 09/07/2022, - 08/17/2022, - 07/20/2022, - 06/29/2022, - 06/01/2022, Brenda [name] has progressive pelvic pain and would like to proceed with cystoscopy with HOD and instillation of clorpactin and tetracaine. She is aware of the risks of the procedure. , - 05/02/2022, - 03/30/2022, - 03/02/2022, - 02/02/2022, - 01/05/2022, - 12/08/2021, - 11/10/2021, - 10/13/2021, - 09/15/2021, - 08/18/2021, - 07/21/2021, - 2023, - 03/31/2021, - 02/17/2021, - 01/20/2021, - 12/23/2020, - 2022, - 2022, - 2022, - 2022, - 2022, - 2022, - 2022, - 2022, - 2022, - 2021, - 2021, - 2021, I will get her scheduled for a cystoscopy and HOD. , - 2021, - 2019, I will get her set up for a repeat HOD and clorpactin. She is well aware of the risks. 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 - 2021 Cervicalgia, Neck pain, bilateral - 2017 Radiculopathy, cervical region, Cervical radiculopathy - 2017 Muscle weakness (generalized), Muscle weakness - 2017 Other muscle spasm, Muscle spasm - 2017 Paresthesia of skin, Paresthesia and pain of both upper extremities - 2017 Follicular disorder, unspecified,  Folliculitis - 2016 Abrasion of unspecified hand, initial encounter, Abrasion of hand - 2015 Acute upper respiratory infection, unspecified, Upper respiratory infection with cough and congestion - 2015 Personal history of other diseases of the respiratory system, History of acute bronchitis - 2015 Vitamin D deficiency, unspecified, Vitamin D deficiency - 2015 Encounter for general adult medical examination without abnormal findings, Encounter for preventive health examination - 2015 Anxiety, Anxiety (Symptom) - 2014 Asthma, Asthma - 2014 Cerebral infarction, unspecified, Stroke Syndrome - 2014 Fibromyalgia, Fibromyalgia - 2014 Irritable bowel syndrome with diarrhea, Irritable Bowel Syndrome - 2014 Personal history of other diseases of the circulatory system, History of hypertension - 2014 Personal history of other diseases of the nervous system and sense organs, History of migraine headaches - 2014 Personal history of other endocrine, nutritional and metabolic disease, History of hypercholesterolemia - 2014 Personal history of other specified conditions, History of fibrocystic disease of breast - 2014 Rash and other nonspecific skin eruption, Rash - 2014 Abnormal results of liver function studies    FAMILY HISTORY: Acute Myocardial Infarction - Mother Bladder Cancer - Father colonic polyps - Father Congestive Heart Failure - Mother Coronary Artery Disease - Mother Death In The Family Father - Runs In Family Death In The Family Mother - Runs In Family Diabetes - Mother, Sister Family Health Status Number - Runs In Family fibromyalgia - Mother Glioblastoma Multiforme (Grade IV) Of The Brain - Father Heart Disease - Mother Malignant Melanoma Of The Skin - Sister Pure Hypercholesterolemia - Father skin cancer - Mother, Father Stroke Syndrome - Mother Ulcer, Gastric - Mother   SOCIAL HISTORY: Marital Status: Married Preferred Language: English; Ethnicity: Not Hispanic Or Latino;  Race: White Current Smoking Status: Patient has never smoked.  Does not use smokeless tobacco. Has never drank.  Does not use drugs. Drinks 1 caffeinated drink per day. 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    REVIEW OF SYSTEMS:    GU Review Female:   Patient reports get up at night to urinate. Patient denies frequent urination, hard to postpone urination, burning /pain with urination, leakage of urine, stream starts and stops, trouble starting your stream, have to strain to urinate, and being pregnant.  Gastrointestinal (Upper):   Patient denies nausea, vomiting, and indigestion/ heartburn.  Gastrointestinal (Lower):   Patient denies diarrhea and constipation.  Constitutional:   Patient denies night sweats, fatigue, fever, and weight loss.  Skin:   Patient denies skin rash/ lesion and itching.  Eyes:   Patient denies blurred vision and double vision.  Ears/ Nose/ Throat:   Patient denies sore throat and sinus problems.  Hematologic/Lymphatic:   Patient denies swollen glands and easy bruising.  Cardiovascular:   Patient denies leg swelling and chest pains.  Respiratory:   Patient denies cough and shortness of breath.  Endocrine:   Patient denies excessive thirst.  Musculoskeletal:   Patient denies back pain and joint pain.  Neurological:   Patient denies headaches and dizziness.  Psychologic:   Patient denies depression and anxiety.   VITAL SIGNS: None   MULTI-SYSTEM PHYSICAL EXAMINATION:    Constitutional: Well-nourished. No physical deformities. Normally developed. Good  grooming.  Respiratory: No labored breathing, no use of accessory muscles.   Cardiovascular: Regular rate and rhythm. No murmur, no gallop.      Complexity of Data:  Records Review:   Previous Patient Records  Urine Test Review:   Urinalysis   05/31/23  Urinalysis  Urine Appearance Clear   Urine Color Yellow   Urine Glucose Neg mg/dL  Urine Bilirubin Neg mg/dL  Urine Ketones Neg mg/dL  Urine  Specific Gravity 1.025   Urine Blood Neg ery/uL  Urine pH 6.0   Urine Protein Trace mg/dL  Urine Urobilinogen 0.2 mg/dL  Urine Nitrites Neg   Urine Leukocyte Esterase 1+ leu/uL  Urine WBC/hpf 0 - 5/hpf   Urine RBC/hpf NS (Not Seen)   Urine Epithelial Cells 0 - 5/hpf   Urine Bacteria Rare (0-9/hpf)   Urine Mucous Present   Urine Yeast NS (Not Seen)   Urine Trichomonas Not Present   Urine Cystals NS (Not Seen)   Urine Casts NS (Not Seen)   Urine Sperm Not Present    PROCEDURES: None   ASSESSMENT:      ICD-10 Details  1 GU:   Interstitial Cystitis (w/o hematuria) - N30.10 Chronic, Exacerbation - Eve has increased pain and nocturia and would like to repeat the cystoscopy and HOD. She is aware of the risks and will get this scheduled.   2   Pelvic/perineal pain - R10.2 Chronic, Exacerbation   PLAN:           Schedule Return Visit/Planned Activity: Next Available Appointment - Schedule Surgery          Document Letter(s):  Created for Patient: Clinical Summary         Notes:   CC: Dr. Larina Earthly.

## 2023-07-19 ENCOUNTER — Ambulatory Visit (HOSPITAL_COMMUNITY)
Admission: RE | Admit: 2023-07-19 | Discharge: 2023-07-19 | Disposition: A | Discharge status: Home / Self Care | Payer: PPO | Source: Ambulatory Visit | Attending: Urology | Admitting: Urology

## 2023-07-19 ENCOUNTER — Encounter (HOSPITAL_COMMUNITY)
Admission: RE | Disposition: A | Discharge status: Home / Self Care | Payer: Self-pay | Source: Ambulatory Visit | Attending: Urology

## 2023-07-19 ENCOUNTER — Other Ambulatory Visit (HOSPITAL_COMMUNITY): Payer: Self-pay

## 2023-07-19 ENCOUNTER — Other Ambulatory Visit: Payer: Self-pay

## 2023-07-19 ENCOUNTER — Encounter (HOSPITAL_COMMUNITY): Payer: Self-pay | Admitting: Urology

## 2023-07-19 ENCOUNTER — Ambulatory Visit (HOSPITAL_COMMUNITY): Payer: Self-pay | Admitting: Physician Assistant

## 2023-07-19 ENCOUNTER — Ambulatory Visit (HOSPITAL_COMMUNITY): Admitting: Anesthesiology

## 2023-07-19 ENCOUNTER — Other Ambulatory Visit (HOSPITAL_BASED_OUTPATIENT_CLINIC_OR_DEPARTMENT_OTHER): Payer: Self-pay

## 2023-07-19 DIAGNOSIS — J45909 Unspecified asthma, uncomplicated: Secondary | ICD-10-CM | POA: Diagnosis not present

## 2023-07-19 DIAGNOSIS — I129 Hypertensive chronic kidney disease with stage 1 through stage 4 chronic kidney disease, or unspecified chronic kidney disease: Secondary | ICD-10-CM | POA: Insufficient documentation

## 2023-07-19 DIAGNOSIS — F418 Other specified anxiety disorders: Secondary | ICD-10-CM | POA: Diagnosis not present

## 2023-07-19 DIAGNOSIS — I10 Essential (primary) hypertension: Secondary | ICD-10-CM

## 2023-07-19 DIAGNOSIS — N3010 Interstitial cystitis (chronic) without hematuria: Secondary | ICD-10-CM

## 2023-07-19 DIAGNOSIS — I639 Cerebral infarction, unspecified: Secondary | ICD-10-CM | POA: Diagnosis not present

## 2023-07-19 DIAGNOSIS — N183 Chronic kidney disease, stage 3 unspecified: Secondary | ICD-10-CM | POA: Insufficient documentation

## 2023-07-19 DIAGNOSIS — M797 Fibromyalgia: Secondary | ICD-10-CM | POA: Insufficient documentation

## 2023-07-19 DIAGNOSIS — K219 Gastro-esophageal reflux disease without esophagitis: Secondary | ICD-10-CM | POA: Insufficient documentation

## 2023-07-19 HISTORY — PX: CYSTOSCOPY WITH HYDRODISTENSION AND BIOPSY: SHX5127

## 2023-07-19 LAB — GLUCOSE, CAPILLARY
Glucose-Capillary: 88 mg/dL (ref 70–99)
Glucose-Capillary: 114 mg/dL — ABNORMAL HIGH (ref 70–99)

## 2023-07-19 SURGERY — CYSTOSCOPY, WITH BLADDER HYDRODISTENSION AND BIOPSY
Anesthesia: Monitor Anesthesia Care

## 2023-07-19 MED ORDER — LIDOCAINE HCL (PF) 2 % IJ SOLN
INTRAMUSCULAR | Status: AC
  Filled 2023-07-19: qty 5

## 2023-07-19 MED ORDER — ONDANSETRON HCL 4 MG/2ML IJ SOLN: 4.0000 mg | Freq: Four times a day (QID) | INTRAMUSCULAR | Status: DC | PRN

## 2023-07-19 MED ORDER — PROPOFOL 1000 MG/100ML IV EMUL
INTRAVENOUS | Status: AC
  Filled 2023-07-19: qty 100

## 2023-07-19 MED ORDER — LIDOCAINE HCL URETHRAL/MUCOSAL 2 % EX GEL
CUTANEOUS | Status: AC
  Filled 2023-07-19: qty 5

## 2023-07-19 MED ORDER — PROPOFOL 500 MG/50ML IV EMUL
INTRAVENOUS | Status: AC
  Filled 2023-07-19: qty 50

## 2023-07-19 MED ORDER — ONDANSETRON HCL 4 MG/2ML IJ SOLN
INTRAMUSCULAR | Status: AC
  Filled 2023-07-19: qty 2

## 2023-07-19 MED ORDER — FENTANYL CITRATE PF 50 MCG/ML IJ SOSY
PREFILLED_SYRINGE | INTRAMUSCULAR | Status: AC
  Filled 2023-07-19: qty 1

## 2023-07-19 MED ORDER — CHLORHEXIDINE GLUCONATE 0.12 % MT SOLN
15.0000 mL | Freq: Once | OROMUCOSAL | Status: AC
  Administered 2023-07-19: 15 mL via OROMUCOSAL

## 2023-07-19 MED ORDER — MEPERIDINE HCL 50 MG/ML IJ SOLN
50.0000 mg | Freq: Once | INTRAMUSCULAR | Status: AC
  Administered 2023-07-19: 50 mg via INTRAMUSCULAR

## 2023-07-19 MED ORDER — DEXAMETHASONE SODIUM PHOSPHATE 10 MG/ML IJ SOLN
INTRAMUSCULAR | Status: AC
  Filled 2023-07-19: qty 1

## 2023-07-19 MED ORDER — SODIUM CHLORIDE 0.9% FLUSH: 3.0000 mL | Freq: Two times a day (BID) | INTRAVENOUS | Status: DC

## 2023-07-19 MED ORDER — PROPOFOL 10 MG/ML IV BOLUS
INTRAVENOUS | Status: AC
  Filled 2023-07-19: qty 20

## 2023-07-19 MED ORDER — ONDANSETRON HCL 4 MG/2ML IJ SOLN
INTRAMUSCULAR | Status: DC | PRN
  Administered 2023-07-19: 4 mg via INTRAVENOUS

## 2023-07-19 MED ORDER — CIPROFLOXACIN IN D5W 400 MG/200ML IV SOLN
400.0000 mg | INTRAVENOUS | Status: AC
  Administered 2023-07-19: 400 mg via INTRAVENOUS
  Filled 2023-07-19: qty 200

## 2023-07-19 MED ORDER — ACETAMINOPHEN 10 MG/ML IV SOLN
INTRAVENOUS | Status: AC
  Filled 2023-07-19: qty 100

## 2023-07-19 MED ORDER — ACETAMINOPHEN 10 MG/ML IV SOLN
1000.0000 mg | Freq: Once | INTRAVENOUS | Status: AC
  Administered 2023-07-19: 1000 mg via INTRAVENOUS

## 2023-07-19 MED ORDER — MEPERIDINE HCL 50 MG/ML IJ SOLN
INTRAMUSCULAR | Status: AC
  Filled 2023-07-19: qty 1

## 2023-07-19 MED ORDER — MEPERIDINE HCL 25 MG/ML IJ SOLN
25.0000 mg | Freq: Every day | INTRAMUSCULAR | 0 refills | Status: DC | PRN
  Filled 2023-07-19: qty 8, 8d supply, fill #0

## 2023-07-19 MED ORDER — STERILE WATER FOR IRRIGATION IR SOLN
Status: DC | PRN
  Administered 2023-07-19: 3000 mL

## 2023-07-19 MED ORDER — FENTANYL CITRATE (PF) 100 MCG/2ML IJ SOLN
INTRAMUSCULAR | Status: AC
  Filled 2023-07-19: qty 2

## 2023-07-19 MED ORDER — FENTANYL CITRATE PF 50 MCG/ML IJ SOSY
25.0000 ug | PREFILLED_SYRINGE | INTRAMUSCULAR | Status: DC | PRN
  Administered 2023-07-19 (×2): 50 ug via INTRAVENOUS

## 2023-07-19 MED ORDER — ORAL CARE MOUTH RINSE: 15.0000 mL | Freq: Once | OROMUCOSAL | Status: AC

## 2023-07-19 MED ORDER — LACTATED RINGERS IV SOLN: INTRAVENOUS | Status: DC

## 2023-07-19 MED ORDER — PROPOFOL 500 MG/50ML IV EMUL
INTRAVENOUS | Status: DC | PRN
  Administered 2023-07-19: 20 mg via INTRAVENOUS
  Administered 2023-07-19: 120 ug/kg/min via INTRAVENOUS
  Administered 2023-07-19: 20 mg via INTRAVENOUS
  Administered 2023-07-19: 30 mg via INTRAVENOUS
  Administered 2023-07-19: 20 mg via INTRAVENOUS

## 2023-07-19 SURGICAL SUPPLY — 22 items
BAG URINE DRAIN 2000ML AR STRL (UROLOGICAL SUPPLIES) IMPLANT
BAG URINE LEG 500ML (DRAIN) IMPLANT
BAG URO CATCHER STRL LF (MISCELLANEOUS) ×2 IMPLANT
CATH FOLEY 3WAY 30CC 22FR (CATHETERS) IMPLANT
CATH SILICONE 14FRX5CC (CATHETERS) IMPLANT
CATH URETL OPEN 5X70 (CATHETERS) IMPLANT
DRAPE FOOT SWITCH (DRAPES) ×2 IMPLANT
ELECT REM PT RETURN 15FT ADLT (MISCELLANEOUS) ×2 IMPLANT
GLOVE SURG SS PI 8.0 STRL IVOR (GLOVE) ×2 IMPLANT
GOWN STRL REUS W/ TWL XL LVL3 (GOWN DISPOSABLE) ×2 IMPLANT
HOLDER FOLEY CATH W/STRAP (MISCELLANEOUS) IMPLANT
KIT TURNOVER KIT A (KITS) IMPLANT
LOOP CUT BIPOLAR 24F LRG (ELECTROSURGICAL) IMPLANT
MANIFOLD NEPTUNE II (INSTRUMENTS) ×2 IMPLANT
PACK CYSTO (CUSTOM PROCEDURE TRAY) ×2 IMPLANT
PLUG CATH AND CAP STRL 200 (CATHETERS) IMPLANT
SUT ETHILON 3 0 PS 1 (SUTURE) IMPLANT
SYR 30ML LL (SYRINGE) IMPLANT
SYR TOOMEY IRRIG 70ML (MISCELLANEOUS) IMPLANT
SYRINGE TOOMEY IRRIG 70ML (MISCELLANEOUS) IMPLANT
TUBING CONNECTING 10 (TUBING) ×2 IMPLANT
TUBING UROLOGY SET (TUBING) ×2 IMPLANT

## 2023-07-19 NOTE — OR Nursing (Signed)
 Dr. Annabell Howells brought from office Tetracaine HCL 1:500/Neomycin Sulf 0.1% solution and instilled in  bladder.

## 2023-07-19 NOTE — Transfer of Care (Signed)
 Immediate Anesthesia Transfer of Care Note  Patient: Brenda [name]  Procedure(s) Performed: CYSTOSCOPY, WITH BLADDER HYDRODISTENSION  Patient Location: PACU  Anesthesia Type:MAC  Level of Consciousness: sedated  Airway & Oxygen Therapy: Patient Spontanous Breathing and Patient connected to face mask oxygen  Post-op Assessment: Report given to RN and Post -op Vital signs reviewed and stable  Post vital signs: Reviewed and stable  Last Vitals:  Vitals Value Taken Time  BP 137/85 07/19/23 1202  Temp    Pulse    Resp 16 07/19/23 1203  SpO2    Vitals shown include unfiled device data.  Last Pain:  Vitals:   07/19/23 0944  TempSrc:   PainSc: 6          Complications: No notable events documented.

## 2023-07-19 NOTE — Anesthesia Preprocedure Evaluation (Signed)
Anesthesia Evaluation  Patient identified by MRN, date of birth, ID band Patient awake    Reviewed: Patient's Chart, lab work & pertinent test results  History of Anesthesia Complications (+) PONV and history of anesthetic complications  Airway Mallampati: II  TM Distance: >3 FB Neck ROM: Full    Dental  (+) Teeth Intact   Pulmonary asthma    Pulmonary exam normal        Cardiovascular hypertension, Pt. on medications and Pt. on home beta blockers  Rhythm:Regular Rate:Normal     Neuro/Psych  Headaches  Anxiety Depression    CVA, No Residual Symptoms    GI/Hepatic Neg liver ROS,GERD  Medicated,,  Endo/Other  negative endocrine ROS    Renal/GU Renal InsufficiencyRenal disease Bladder dysfunction  Interstitial cystitis, urethral stenosis    Musculoskeletal  (+) Arthritis , Osteoarthritis,  Fibromyalgia -  Abdominal  (+)  Abdomen: soft. Bowel sounds: normal.  Peds  Hematology negative hematology ROS (+)   Anesthesia Other Findings   Reproductive/Obstetrics                             Anesthesia Physical Anesthesia Plan  ASA: 3  Anesthesia Plan: MAC   Post-op Pain Management: Minimal or no pain anticipated   Induction: Intravenous  PONV Risk Score and Plan: 3 and Ondansetron, Dexamethasone, Treatment may vary due to age or medical condition and Midazolam  Airway Management Planned: Natural Airway and Simple Face Mask  Additional Equipment: None  Intra-op Plan:   Post-operative Plan:   Informed Consent: I have reviewed the patients History and Physical, chart, labs and discussed the procedure including the risks, benefits and alternatives for the proposed anesthesia with the patient or authorized representative who has indicated his/her understanding and acceptance.     Dental advisory given  Plan Discussed with: CRNA and Surgeon  Anesthesia Plan Comments:          Anesthesia Quick Evaluation

## 2023-07-19 NOTE — Discharge Instructions (Addendum)
 CYSTOSCOPY HOME CARE INSTRUCTIONS  Activity: Rest for the remainder of the day.  Do not drive or operate equipment today.  You may resume normal activities in one to two days as instructed by your physician.   Meals: Drink plenty of liquids and eat light foods such as gelatin or soup this evening.  You may return to a normal meal plan tomorrow.  Return to Work: You may return to work in one to two days or as instructed by your physician.  Special Instructions / Symptoms: Call your physician if any of these symptoms occur:   -persistent or heavy bleeding  -bleeding which continues after first few urination  -large blood clots that are difficult to pass  -urine stream diminishes or stops completely  -fever equal to or higher than 101 degrees Farenheit.  -cloudy urine with a strong, foul odor  -severe pain  Females should always wipe from front to back after elimination.  You may feel some burning pain when you urinate.  This should disappear with time.  Applying moist heat to the lower abdomen or a hot tub bath may help relieve the pain. \   Patient Signature:  ________________________________________________________  Nurse's Signature:  ________________________________________________________

## 2023-07-19 NOTE — Op Note (Signed)
 Procedure: Cystoscopy with hydrodistention with bladder instillation of tetracaine.   Preop diagnosis: Interstitial cystitis.   Postop diagnosis: Same.   Surgeon: Dr. Bjorn Pippin.   Anesthesia: MAC.   Drain: Latex free Foley catheter.   Specimen: None.   Blood loss: None.   Complications: None.   Indications: Patient is a 73 year old female with history of chronic interstitial cystitis who was recently had a pain flare is elected hydrodistention for management.   Procedure: She was taken the operating room where she was given Cipro IV.  Sedation was given as needed.  She was placed in lithotomy position and fitted with PAS hose.  Her perineum and genitalia were prepped with Betadine.   Cystoscopy was performed using a 21 [place] scope and 30 degree lens.  Examination revealed a normal bladder wall without tumors, stones or inflammation.  Ureteral orifices were unremarkable.  Urethra was unremarkable.   After completion of cystoscopy the bladder was filled to capacity at 80 cm of water pressure.  Her capacity under anesthesia was 500 mL.  And repeat cystoscopy after dilation demonstrated diffuse glomerular hemorrhages consistent with interstitial cystitis.   The cystoscope was removed and a 16 French latex free Foley was placed.  The balloon was filled with 10 mL of sterile fluid.   The bladder was then drained and Irrigated with sterile water.  Finally she was instilled with 30 mg of tetracaine and the catheter was plugged.  She was taken down from lithotomy position, her sedation was reversed and she was moved to recovery room in stable condition.  The catheter will be removed in 15 minutes from instillation of the tetracaine.  There were no complications.

## 2023-07-19 NOTE — Anesthesia Postprocedure Evaluation (Signed)
 Anesthesia Post Note  Patient: Brenda [name]  Procedure(s) Performed: CYSTOSCOPY, WITH BLADDER HYDRODISTENSION     Patient location during evaluation: PACU Anesthesia Type: MAC Level of consciousness: awake and alert Pain management: pain level controlled Vital Signs Assessment: post-procedure vital signs reviewed and stable Respiratory status: spontaneous breathing, nonlabored ventilation, respiratory function stable and patient connected to nasal cannula oxygen Cardiovascular status: stable and blood pressure returned to baseline Postop Assessment: no apparent nausea or vomiting Anesthetic complications: no   No notable events documented.  Last Vitals:  Vitals:   07/19/23 1315 07/19/23 1328  BP: 125/81 (P) 123/81  Pulse: (!) 56 (!) (P) 57  Resp: 17   Temp:  (P) 36.6 C  SpO2: 100% (P) 92%    Last Pain:  Vitals:   07/19/23 1350  TempSrc:   PainSc: 4                  Calisa Luckenbaugh S

## 2023-07-19 NOTE — Interval H&P Note (Signed)
 History and Physical Interval Note:  07/19/2023 9:38 AM  Brenda [name]  has presented today for surgery, with the diagnosis of INTERSTITIAL CYSTITIS.  The various methods of treatment have been discussed with the patient and family. After consideration of risks, benefits and other options for treatment, the patient has consented to  Procedure(s): CYSTOSCOPY, WITH BLADDER HYDRODISTENSION AND BIOPSY (N/A) as a surgical intervention.  The patient's history has been reviewed, patient examined, no change in status, stable for surgery.  I have reviewed the patient's chart and labs.  Questions were answered to the patient's satisfaction.     Bjorn Pippin

## 2023-07-20 ENCOUNTER — Encounter (HOSPITAL_COMMUNITY): Payer: Self-pay | Admitting: Urology

## 2023-07-23 DIAGNOSIS — G5601 Carpal tunnel syndrome, right upper limb: Secondary | ICD-10-CM | POA: Diagnosis not present

## 2023-08-01 DIAGNOSIS — G5601 Carpal tunnel syndrome, right upper limb: Secondary | ICD-10-CM | POA: Diagnosis not present

## 2023-08-07 ENCOUNTER — Other Ambulatory Visit (HOSPITAL_COMMUNITY): Payer: Self-pay

## 2023-08-09 DIAGNOSIS — N3010 Interstitial cystitis (chronic) without hematuria: Secondary | ICD-10-CM | POA: Diagnosis not present

## 2023-08-23 DIAGNOSIS — R058 Other specified cough: Secondary | ICD-10-CM | POA: Diagnosis not present

## 2023-08-23 DIAGNOSIS — R0602 Shortness of breath: Secondary | ICD-10-CM | POA: Diagnosis not present

## 2023-08-23 DIAGNOSIS — R0981 Nasal congestion: Secondary | ICD-10-CM | POA: Diagnosis not present

## 2023-08-23 DIAGNOSIS — Z1152 Encounter for screening for COVID-19: Secondary | ICD-10-CM | POA: Diagnosis not present

## 2023-08-23 DIAGNOSIS — R5383 Other fatigue: Secondary | ICD-10-CM | POA: Diagnosis not present

## 2023-08-23 DIAGNOSIS — U071 COVID-19: Secondary | ICD-10-CM | POA: Diagnosis not present

## 2023-08-23 DIAGNOSIS — J309 Allergic rhinitis, unspecified: Secondary | ICD-10-CM | POA: Diagnosis not present

## 2023-08-30 DIAGNOSIS — N3010 Interstitial cystitis (chronic) without hematuria: Secondary | ICD-10-CM | POA: Diagnosis not present

## 2023-09-03 ENCOUNTER — Ambulatory Visit (INDEPENDENT_AMBULATORY_CARE_PROVIDER_SITE_OTHER): Admitting: Family Medicine

## 2023-09-03 ENCOUNTER — Encounter (INDEPENDENT_AMBULATORY_CARE_PROVIDER_SITE_OTHER): Payer: Self-pay | Admitting: Family Medicine

## 2023-09-03 VITALS — BP 126/79 | HR 59 | Temp 97.9°F | Ht 59.0 in | Wt 139.0 lb

## 2023-09-03 DIAGNOSIS — E669 Obesity, unspecified: Secondary | ICD-10-CM | POA: Diagnosis not present

## 2023-09-03 DIAGNOSIS — Z6828 Body mass index (BMI) 28.0-28.9, adult: Secondary | ICD-10-CM | POA: Diagnosis not present

## 2023-09-03 DIAGNOSIS — Z7984 Long term (current) use of oral hypoglycemic drugs: Secondary | ICD-10-CM

## 2023-09-03 DIAGNOSIS — E119 Type 2 diabetes mellitus without complications: Secondary | ICD-10-CM

## 2023-09-03 DIAGNOSIS — R7303 Prediabetes: Secondary | ICD-10-CM

## 2023-09-03 MED ORDER — METFORMIN HCL 500 MG PO TABS: ORAL_TABLET | ORAL | 0 refills | Status: DC

## 2023-09-03 NOTE — Progress Notes (Signed)
 Office: [phone]  /  Fax: [phone]  WEIGHT SUMMARY AND BIOMETRICS  Anthropometric Measurements Height: 4\' 11"  (1.499 m) Weight: 139 lb (63 kg) BMI (Calculated): 28.06 Weight at Last Visit: 139 lb Weight Lost Since Last Visit: 0 Weight Gained Since Last Visit: 0 Starting Weight: 147 lb Total Weight Loss (lbs): 8 lb (3.629 kg) Peak Weight: 151 lb   Body Composition  Body Fat %: 40.7 % Fat Mass (lbs): 56.6 lbs Muscle Mass (lbs): 78.2 lbs Total Body Water  (lbs): 56 lbs Visceral Fat Rating : 11   Other Clinical Data Fasting: No Labs: No Today's Visit #: 24 Starting Date: 08/03/21    Chief Complaint: OBESITY   Discussed the use of AI scribe software for clinical note transcription with the patient, who gave verbal consent to proceed.  History of Present Illness Brenda [name] is a 73 year old female with obesity and type 2 diabetes who presents for obesity treatment plan assessment and progress evaluation.  She has been adhering to a prescribed 1000 to 1200 calorie eating plan with journaling and a target of 75 or more grams of protein, achieving this about 50% of the time. Despite not exercising recently due to a COVID-19 illness, she has maintained her weight over the last two months.   Her type 2 diabetes management includes home monitoring of blood glucose levels, which have been around 90 to 119 mg/dL. Her recent A1c is 6.3%, higher than previous levels. She takes metformin  at a dose of half a pill once a day.  She has a history of pancreatitis and chronic kidney disease, which she considers when evaluating new supplements. She takes cinnamon bark and vitamin C, and recently purchased apple drops containing chromium picolinate but has not used them due to concerns about kidney safety.      PHYSICAL EXAM:  Blood pressure 126/79, pulse (!) 59, temperature 97.9 F (36.6 C), height 4\' 11"  (1.499 m), weight 139 lb (63 kg), SpO2 99%. Body mass index is 28.07  kg/m.  DIAGNOSTIC DATA REVIEWED:  BMET    Component Value Date/Time   NA 142 07/09/2023 0948   K 4.2 07/09/2023 0948   CL 103 07/09/2023 0948   CO2 20 07/09/2023 0948   GLUCOSE 82 07/09/2023 0948   GLUCOSE 93 02/28/2021 1050   BUN 23 07/09/2023 0948   CREATININE 1.19 (H) 07/09/2023 0948   CALCIUM 9.9 07/09/2023 0948   GFRNONAA 44.4 09/11/2022 1437   GFRAA >60 12/04/2016 1351   Lab Results  Component Value Date   HGBA1C 6.3 (H) 07/09/2023   HGBA1C 6.1 (H) 08/03/2021   Lab Results  Component Value Date   INSULIN  17.1 07/09/2023   INSULIN  4.9 08/03/2021   Lab Results  Component Value Date   TSH 2.170 12/19/2021   CBC    Component Value Date/Time   WBC 8.5 07/16/2023 1257   RBC 4.45 07/16/2023 1257   HGB 13.6 07/16/2023 1257   HGB 13.6 05/01/2022 1158   HCT 42.2 07/16/2023 1257   HCT 41.0 05/01/2022 1158   PLT 305 07/16/2023 1257   PLT 259 05/01/2022 1158   MCV 94.8 07/16/2023 1257   MCV 93 05/01/2022 1158   MCH 30.6 07/16/2023 1257   MCHC 32.2 07/16/2023 1257   RDW 14.1 07/16/2023 1257   RDW 14.2 05/01/2022 1158   Iron Studies No results found for: "IRON", "TIBC", "FERRITIN", "IRONPCTSAT" Lipid Panel     Component Value Date/Time   CHOL 154 07/09/2023 0948   TRIG 89  07/09/2023 0948   HDL 79 07/09/2023 0948   LDLCALC 59 07/09/2023 0948   Hepatic Function Panel     Component Value Date/Time   PROT 6.6 07/09/2023 0948   ALBUMIN 4.4 07/09/2023 0948   AST 21 07/09/2023 0948   ALT 25 07/09/2023 0948   ALKPHOS 93 07/09/2023 0948   BILITOT 0.5 07/09/2023 0948      Component Value Date/Time   TSH 2.170 12/19/2021 0920   Nutritional Lab Results  Component Value Date   VD25OH 74.6 07/09/2023   VD25OH 72.2 12/11/2022   VD25OH 33.5 09/11/2022     Assessment and Plan Assessment & Plan Type 2 diabetes mellitus Type 2 diabetes with recent A1c of 6.3%, slightly elevated from previous levels, possibly due to recent steroid use. Current home glucose  monitoring shows fasting levels of 90-119 mg/dL. Metformin  is preferred over supplements like chromium picolinate due to its superior efficacy and safety profile, particularly its renal safety. Currently taking half a tablet of metformin  daily, with consideration for increasing to half a tablet twice daily. - Increase metformin  to half a tablet twice daily as tolerated. - Continue home blood glucose monitoring. - Refill metformin  prescription with instructions to cut tablets in half.  Obesity Obesity management with a prescribed 1000-1200 calorie diet and 75 grams of protein daily. Adherence is approximately 50%. Recent COVID-19 infection impacted exercise routine. Weight has been maintained over the last two months. Emphasis on the ineffectiveness and potential harm of weight loss supplements, particularly those containing chromium picolinate, due to kidney issues. Importance of exercise and realistic goal setting for weight management discussed. - Continue prescribed 1000-1200 calorie diet with 75 grams of protein daily. - Encourage resumption of exercise as recovery from COVID-19 allows. - Advise against weight loss supplements, particularly those containing chromium picolinate, due to potential kidney issues. - Encourage realistic goal setting for exercise and weight management.      She was informed of the importance of frequent follow up visits to maximize her success with intensive lifestyle modifications for her multiple health conditions.    Jasmine Mesi, MD

## 2023-09-04 ENCOUNTER — Other Ambulatory Visit (HOSPITAL_COMMUNITY): Payer: Self-pay

## 2023-09-16 ENCOUNTER — Other Ambulatory Visit (INDEPENDENT_AMBULATORY_CARE_PROVIDER_SITE_OTHER): Payer: Self-pay | Admitting: Family Medicine

## 2023-09-16 DIAGNOSIS — R7303 Prediabetes: Secondary | ICD-10-CM

## 2023-09-17 DIAGNOSIS — Z1212 Encounter for screening for malignant neoplasm of rectum: Secondary | ICD-10-CM | POA: Diagnosis not present

## 2023-09-17 DIAGNOSIS — E785 Hyperlipidemia, unspecified: Secondary | ICD-10-CM | POA: Diagnosis not present

## 2023-09-18 ENCOUNTER — Encounter: Payer: Self-pay | Admitting: Physician Assistant

## 2023-09-18 ENCOUNTER — Ambulatory Visit: Admitting: Physician Assistant

## 2023-09-18 DIAGNOSIS — M1712 Unilateral primary osteoarthritis, left knee: Secondary | ICD-10-CM | POA: Diagnosis not present

## 2023-09-18 MED ORDER — LIDOCAINE HCL 1 % IJ SOLN
3.0000 mL | INTRAMUSCULAR | Status: AC | PRN
  Administered 2023-09-18: 3 mL

## 2023-09-18 MED ORDER — METHYLPREDNISOLONE ACETATE 40 MG/ML IJ SUSP
40.0000 mg | INTRAMUSCULAR | Status: AC | PRN
  Administered 2023-09-18: 40 mg via INTRA_ARTICULAR

## 2023-09-18 NOTE — Progress Notes (Signed)
 Office Visit Note   Patient: Brenda [name]           Date of Birth: 1950-11-06           MRN: [id number] Visit Date: 09/18/2023              Requested by: Lonzie Robins, MD [address] [place],  [place] [address] PCP: Avva, Ravisankar, MD   Assessment & Plan: Visit Diagnoses:  1. Unilateral primary osteoarthritis, left knee     Plan: Brenda [name] is a very pleasant 73 year old woman who is a patient of Dr. Aviva Lemmings.  She comes in today complaining of left knee and leg pain.  She did break her other leg last year she is not sure if this is related to it.  She said she has had her knee injected in the past.  Examination and previous x-rays taken last year are consistent with some arthritis in the left knee.  We talked about the natural history of this.  I showed her some close chain quadricep strengthening exercises.  Will go forward with an injection today may follow-up as needed  Follow-Up Instructions: Return if symptoms worsen or fail to improve.   Orders:  No orders of the defined types were placed in this encounter.  No orders of the defined types were placed in this encounter.     Procedures: Large Joint Inj on 09/18/2023 1:15 PM Indications: pain and diagnostic evaluation Details: 25 G 1.5 in needle, anteromedial approach  Arthrogram: No  Medications: 40 mg methylPREDNISolone  acetate 40 MG/ML; 3 mL lidocaine  1 % Outcome: tolerated well, no immediate complications Procedure, treatment alternatives, risks and benefits explained, specific risks discussed. Consent was given by the patient.     Clinical Data: No additional findings.   Subjective: No chief complaint on file.   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  Denies any recent injury but did break her right leg last year.  Has noticed pain starting in the medial side of her left knee joint radiating down to her ankle.  Denies any  paresthesias.  Review of Systems  All other systems reviewed and are negative.    Objective: Vital Signs: There were no vitals taken for this visit.  Physical Exam Constitutional:      Appearance: Normal appearance.  Pulmonary:     Effort: Pulmonary effort is normal.  Skin:    General: Skin is warm and dry.  Neurological:     General: No focal deficit present.     Mental Status: She is alert and oriented to person, place, and time.  Psychiatric:        Mood and Affect: Mood normal.        Behavior: Behavior normal.   Ortho Exam Left knee no effusion no erythema compartments are soft and compressible she is neurovascular intact good extension and flexion strength plantarflexion and dorsiflexion strength in her ankle.  Tender over the medial joint line good stability Specialty Comments:  No specialty comments available.  Imaging: No results found.   PMFS History: Patient Active Problem List   Diagnosis Date Noted  . Other constipation 06/04/2023  . Insulin  resistance 05/29/2022  . BMI 28.0-28.9,adult 05/29/2022  . Obesity, Beginning BMI 29.69 05/29/2022  . Lesion of breast 04/24/2022  . Essential hypertension 02/19/2022  . SOBOE (shortness of breath on exertion) 01/22/2022  . Class 1 obesity with serious comorbidity and body mass index (BMI)  of 30.0 to 30.9 in adult 01/22/2022  . Stage 3 chronic kidney disease (HCC) 08/17/2021  . Transaminitis 08/17/2021  . Vitamin D  deficiency 08/17/2021  . Encounter for counseling 06/19/2021  . Changing skin lesion 11/11/2020  . Aortic atherosclerosis (HCC) 11/10/2020  . Pre-diabetes 11/10/2020  . Mixed hyperlipidemia 11/10/2020  . Pain in right shoulder 04/20/2020  . Status post bilateral breast reduction 11/07/2018  . Back pain 06/13/2018  . Neck pain 06/13/2018  . Symptomatic mammary hypertrophy 06/13/2018  . GAD (generalized anxiety disorder) 03/13/2018  . Panic disorder with agoraphobia 03/13/2018  . Mild depression  03/13/2018  . Upper airway cough syndrome 08/12/2017  . Unilateral primary osteoarthritis, left knee 05/20/2017  . Persistent cough   . Moderate persistent asthma 03/19/2016  . Chronic seasonal allergic rhinitis 03/19/2016  . GERD (gastroesophageal reflux disease) 03/19/2016  . Cough 03/19/2016  . Essential hypertension, benign 03/19/2016  . H/O: CVA (cerebrovascular accident) 03/19/2016  . IBS (irritable bowel syndrome) 03/19/2016  . Interstitial cystitis 03/19/2016  . Fibromyalgia 03/19/2016  . DJD (degenerative joint disease) 03/19/2016  . Arthritis 03/19/2016  . Sphincter of Oddi dysfunction 03/19/2016  . H/O acute pancreatitis 03/19/2016   Past Medical History:  Diagnosis Date  . Bilateral carotid bruits    followed by cardiologist  . Bladder pain   . Cervical disc disease   . Chronic cough    followed by dr wert and ENT dr Kimberly Penna  . Chronic kidney disease (CKD), stage III (moderate) (HCC)   . Chronic seasonal allergic rhinitis   . Dyspnea    with exertion   . Fibromyalgia   . Frequency of urination   . GAD (generalized anxiety disorder)   . GERD (gastroesophageal reflux disease)   . Heart murmur    cardiologist--- dr croitoru  . History of pancreatitis 1988-1989  . History of panic attacks   . History of stroke 1996   mild without residuals;  per MRI imaging 2010 left thalamic infarct without residual  . Hyperlipidemia, mixed   . Interstitial cystitis    urologist-- dr Inga Manges  . Irritable bowel syndrome with both constipation and diarrhea    followed by dr Willy Harvest  . Labile hypertension    followed by pcp;   hx normal nuclear stress test in 2009;   coronary CT 11-23-2020  normal coronaies, calcium score zero  . Lumbar spondylosis   . MDD (major depressive disorder)   . Migraines   . Moderate persistent asthma    followed by pcp and dr wert   . Multiple pulmonary nodules    pulmonologist-- dr wert  . Osteoarthritis   . Palpitations   . Panic  disorder with agoraphobia   . PONV (postoperative nausea and vomiting)   . Pre-diabetes   . Spasm of sphincter of Oddi 1987   02-13-2019  per pt no issues since she watches what medication's she takes  . Stroke (HCC)   . Urethral stenosis    s/p multiple dilatations  . Urgency of urination   . Vitamin D  deficiency   . Wears glasses     Family History  Problem Relation Age of Onset  . Allergies Mother   . Asthma Mother   . Heart disease Mother   . Osteoarthritis Mother   . Diabetes Mother   . Irritable bowel syndrome Mother   . High blood pressure Mother   . Stroke Mother   . Obesity Mother   . Brain cancer Father   . Osteoarthritis Father   .  Colon polyps Father        adenomatous  . Heart disease Father   . Anxiety disorder Father   . High Cholesterol Father   . Melanoma Sister   . Osteoarthritis Sister   . Irritable bowel syndrome Sister   . Diabetes Sister   . Heart disease Sister   . Stroke Sister   . Colon cancer Maternal Grandmother   . Ulcerative colitis Maternal Uncle   . Colon cancer Cousin   . Stomach cancer Neg Hx   . Rheumatologic disease Neg Hx   . Esophageal cancer Neg Hx   . Pancreatic cancer Neg Hx     Past Surgical History:  Procedure Laterality Date  . ABDOMINAL HYSTERECTOMY  1988   COMPLETE  . APPENDECTOMY  07/1984  . bilateral reduction mastopexy  1988  . bilateral turbinate resection  1990   nasal spreading graft  . BREAST CYST EXCISION Bilateral 03/18/2019   Procedure: excision necrotic breast tissue left ;  Surgeon: Thornell Flirt, DO;  Location: [facility];  Service: Plastics;  Laterality: Bilateral;  . BREAST CYST EXCISION Bilateral 06/30/2020   Procedure: Excision of right breast fat necrosis and left inferior breast;  Surgeon: Thornell Flirt, DO;  Location: [facility];  Service: Plastics;  Laterality: Bilateral;  . BREAST REDUCTION SURGERY  10/17/2018   Procedure: bilateral breast reduction;   Surgeon: Thornell Flirt, DO;  Location: [facility];  Service: Plastics;;  please adjust time to reflect 4 hours  . CATARACT EXTRACTION W/ INTRAOCULAR LENS  IMPLANT, BILATERAL  08 and 09/ 2017  . CHOLECYSTECTOMY  07/1984  . COLONOSCOPY  02/2004, 11/2010, LAST 03-2016 ENDO DONE ALSO   2005: Normal - Dr. Toribio Frees 2012: Normal  . CYSTO WITH HYDRODISTENSION N/A 12/17/2013   Procedure: CYSTO HYDRODISTENSION WITH INSTALLATION OF CHLOROPACTIN AND TETRACAINE ;  Surgeon: Willye Harvey, MD;  Location: [facility];  Service: Urology;  Laterality: N/A;  . CYSTO WITH HYDRODISTENSION N/A 06/02/2015   Procedure: CYSTOSCOPY/HYDRODISTENSION WITH INSTILLATION OF CLORPACTIN ;  Surgeon: Homero Luster, MD;  Location: [facility];  Service: Urology;  Laterality: N/A;  . CYSTO WITH HYDRODISTENSION N/A 11/08/2016   Procedure: CYSTOSCOPY/HYDRODISTENSION INSTILLATION OF CLORPACTIN;  Surgeon: Homero Luster, MD;  Location: [facility];  Service: Urology;  Laterality: N/A;  . CYSTO WITH HYDRODISTENSION N/A 03/06/2018   Procedure: CYSTOSCOPY/HYDRODISTENSION INSTILL CHLORPACTION;  Surgeon: Homero Luster, MD;  Location: [facility];  Service: Urology;  Laterality: N/A;  . CYSTO WITH HYDRODISTENSION N/A 04/02/2019   Procedure: CYSTOSCOPY/HYDRODISTENSION INSTILL CHLORPACTIN;  Surgeon: Homero Luster, MD;  Location: [facility];  Service: Urology;  Laterality: N/A;  . CYSTO WITH HYDRODISTENSION N/A 02/17/2020   Procedure: CYSTOSCOPY/HYDRODISTENTION WITH INSTILLATION OF CHLORPACTIN;  Surgeon: Homero Luster, MD;  Location: [facility];  Service: Urology;  Laterality: N/A;  ONLY NEEDS 30 MIN  . CYSTO WITH HYDRODISTENSION N/A 02/28/2021   Procedure: CYSTOSCOPY/HYDRODISTENSION INSTILL CLORPACTION;  Surgeon: Homero Luster, MD;  Location: [facility];  Service: Urology;  Laterality: N/A;  . CYSTO WITH HYDRODISTENSION N/A 05/04/2022    Procedure: CYSTOSCOPY/HYDRODISTENSION INSTILL CLORPACTIN AND TETRACAINE ;  Surgeon: Homero Luster, MD;  Location: [facility];  Service: Urology;  Laterality: N/A;  . CYSTOSCOPY WITH HYDRODISTENSION AND BIOPSY N/A 07/19/2023   Procedure: CYSTOSCOPY, WITH BLADDER HYDRODISTENSION;  Surgeon: Homero Luster, MD;  Location: [facility];  Service: Urology;  Laterality: N/A;  . CYSTOSCOPY WITH URETHRAL DILATATION  07/12/2011   Procedure: CYSTOSCOPY WITH URETHRAL DILATATION;  Surgeon: Willye Harvey, MD;  Location: [facility];  Service: Urology;  Laterality: N/A;  HOD AND INSTILLATION OF CHLOROPACTIN C-ARM   . CYSTOSCOPY WITH URETHRAL DILATATION N/A 06/26/2012   Procedure: CYSTOSCOPY WITH URETHRAL DILATATION HYDRODISTENSION AND INSTILLATION OF CLORPACTION ;  Surgeon: Willye Harvey, MD;  Location: [facility];  Service: Urology;  Laterality: N/A;  CYSTOSCOPY WITH URETHRAL DILATATION HYDRODISTENSION AND INSTILLATION OF CLORPACTION   . CYSTOSTOMY W/ BLADDER BIOPSY  1983  . DG BARIUM SWALLOW ([facility] HX)  03/2016  . DILATION AND CURETTAGE OF UTERUS  1987  . ERCP  1988   for pancreatitis with stents   S/P SEVERE PANCREATITIS  . HEMORRHOID SURGERY    . LASIK Bilateral 07/2003  . LIPOSUCTION Bilateral 10/17/2018   Procedure: LIPOSUCTION;  Surgeon: Thornell Flirt, DO;  Location: [facility];  Service: Plastics;  Laterality: Bilateral;  . LIPOSUCTION Right 03/18/2019   Procedure: excision right necrotic breast tissue with liposuction;  Surgeon: Thornell Flirt, DO;  Location: [facility];  Service: Plastics;  Laterality: Right;  . LIPOSUCTION Bilateral 06/30/2020   Procedure: Bilateral liposuction of lateral breast;  Surgeon: Thornell Flirt, DO;  Location: [facility];  Service: Plastics;  Laterality: Bilateral;  . MULTIPLE CYSTO/ HOD/ URETHRAL DILATION/ INSTILLATION CLORPACTIN  .LAST ONE 03-31-2010  . NASAL SINUS SURGERY   1977   sinus cyst  . REDUCTION MAMMAPLASTY Bilateral 1988  . REDUCTION MAMMAPLASTY Bilateral 09/2018  . REDUCTION MAMMAPLASTY Bilateral 03/2019  . revision rhinoplasty  1978  . RHINOPLASTY  1977  . RIGHT SALPINGOOPHECTOMY  1998  . TONSILLECTOMY AND ADENOIDECTOMY  1963  . VIDEO BRONCHOSCOPY Bilateral 08/23/2016   Procedure: VIDEO BRONCHOSCOPY WITHOUT FLUORO;  Surgeon: Samual Crochet, MD;  Location: [facility];  Service: Cardiopulmonary;  Laterality: Bilateral;   Social History   Occupational History  . Occupation: Charity fundraiser  Tobacco Use  . Smoking status: Never  . Smokeless tobacco: Never  Vaping Use  . Vaping status: Never Used  Substance and Sexual Activity  . Alcohol  use: No  . Drug use: No  . Sexual activity: Not on file

## 2023-09-20 DIAGNOSIS — N3010 Interstitial cystitis (chronic) without hematuria: Secondary | ICD-10-CM | POA: Diagnosis not present

## 2023-09-24 ENCOUNTER — Encounter: Payer: Self-pay | Admitting: Psychiatry

## 2023-09-24 ENCOUNTER — Ambulatory Visit: Payer: PPO | Admitting: Psychiatry

## 2023-09-24 ENCOUNTER — Ambulatory Visit (INDEPENDENT_AMBULATORY_CARE_PROVIDER_SITE_OTHER): Payer: PPO | Admitting: Psychiatry

## 2023-09-24 DIAGNOSIS — E785 Hyperlipidemia, unspecified: Secondary | ICD-10-CM | POA: Diagnosis not present

## 2023-09-24 DIAGNOSIS — F411 Generalized anxiety disorder: Secondary | ICD-10-CM | POA: Diagnosis not present

## 2023-09-24 DIAGNOSIS — F5105 Insomnia due to other mental disorder: Secondary | ICD-10-CM

## 2023-09-24 DIAGNOSIS — F330 Major depressive disorder, recurrent, mild: Secondary | ICD-10-CM | POA: Diagnosis not present

## 2023-09-24 DIAGNOSIS — F4001 Agoraphobia with panic disorder: Secondary | ICD-10-CM | POA: Diagnosis not present

## 2023-09-24 DIAGNOSIS — I129 Hypertensive chronic kidney disease with stage 1 through stage 4 chronic kidney disease, or unspecified chronic kidney disease: Secondary | ICD-10-CM | POA: Diagnosis not present

## 2023-09-24 DIAGNOSIS — R7301 Impaired fasting glucose: Secondary | ICD-10-CM | POA: Diagnosis not present

## 2023-09-24 DIAGNOSIS — E559 Vitamin D deficiency, unspecified: Secondary | ICD-10-CM | POA: Diagnosis not present

## 2023-09-24 DIAGNOSIS — N1831 Chronic kidney disease, stage 3a: Secondary | ICD-10-CM | POA: Diagnosis not present

## 2023-09-24 MED ORDER — ZOLPIDEM TARTRATE 5 MG PO TABS: ORAL_TABLET | ORAL | 1 refills | Status: AC

## 2023-09-24 MED ORDER — LORAZEPAM 1 MG PO TABS: ORAL_TABLET | ORAL | 5 refills | Status: DC

## 2023-09-24 MED ORDER — FLUOXETINE HCL 10 MG PO CAPS: 10.0000 mg | ORAL_CAPSULE | Freq: Every day | ORAL | 3 refills | Status: AC
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
## 2023-09-30 DIAGNOSIS — M8580 Other specified disorders of bone density and structure, unspecified site: Secondary | ICD-10-CM | POA: Diagnosis not present

## 2023-09-30 DIAGNOSIS — N1831 Chronic kidney disease, stage 3a: Secondary | ICD-10-CM | POA: Diagnosis not present

## 2023-09-30 DIAGNOSIS — Z0000 Encounter for general adult medical examination without abnormal findings: Secondary | ICD-10-CM | POA: Diagnosis not present

## 2023-09-30 DIAGNOSIS — G43909 Migraine, unspecified, not intractable, without status migrainosus: Secondary | ICD-10-CM | POA: Diagnosis not present

## 2023-09-30 DIAGNOSIS — R7301 Impaired fasting glucose: Secondary | ICD-10-CM | POA: Diagnosis not present

## 2023-09-30 DIAGNOSIS — N3010 Interstitial cystitis (chronic) without hematuria: Secondary | ICD-10-CM | POA: Diagnosis not present

## 2023-09-30 DIAGNOSIS — F39 Unspecified mood [affective] disorder: Secondary | ICD-10-CM | POA: Diagnosis not present

## 2023-09-30 DIAGNOSIS — M797 Fibromyalgia: Secondary | ICD-10-CM | POA: Diagnosis not present

## 2023-09-30 DIAGNOSIS — I700 Atherosclerosis of aorta: Secondary | ICD-10-CM | POA: Diagnosis not present

## 2023-09-30 DIAGNOSIS — Z719 Counseling, unspecified: Secondary | ICD-10-CM

## 2023-09-30 DIAGNOSIS — K589 Irritable bowel syndrome without diarrhea: Secondary | ICD-10-CM | POA: Diagnosis not present

## 2023-09-30 DIAGNOSIS — Z1339 Encounter for screening examination for other mental health and behavioral disorders: Secondary | ICD-10-CM | POA: Diagnosis not present

## 2023-09-30 DIAGNOSIS — E785 Hyperlipidemia, unspecified: Secondary | ICD-10-CM | POA: Diagnosis not present

## 2023-09-30 DIAGNOSIS — I129 Hypertensive chronic kidney disease with stage 1 through stage 4 chronic kidney disease, or unspecified chronic kidney disease: Secondary | ICD-10-CM | POA: Diagnosis not present

## 2023-09-30 DIAGNOSIS — Z1331 Encounter for screening for depression: Secondary | ICD-10-CM | POA: Diagnosis not present

## 2023-09-30 DIAGNOSIS — E669 Obesity, unspecified: Secondary | ICD-10-CM | POA: Diagnosis not present

## 2023-10-01 DIAGNOSIS — Z719 Counseling, unspecified: Secondary | ICD-10-CM

## 2023-10-11 DIAGNOSIS — N3010 Interstitial cystitis (chronic) without hematuria: Secondary | ICD-10-CM | POA: Diagnosis not present

## 2023-10-14 DIAGNOSIS — Z719 Counseling, unspecified: Secondary | ICD-10-CM

## 2023-10-21 ENCOUNTER — Other Ambulatory Visit: Payer: Self-pay | Admitting: Internal Medicine

## 2023-10-21 DIAGNOSIS — Z1231 Encounter for screening mammogram for malignant neoplasm of breast: Secondary | ICD-10-CM

## 2023-10-22 ENCOUNTER — Other Ambulatory Visit (HOSPITAL_COMMUNITY): Payer: Self-pay

## 2023-10-22 ENCOUNTER — Encounter: Payer: Self-pay | Admitting: Plastic Surgery

## 2023-10-22 ENCOUNTER — Ambulatory Visit: Admitting: Plastic Surgery

## 2023-10-22 ENCOUNTER — Other Ambulatory Visit (HOSPITAL_BASED_OUTPATIENT_CLINIC_OR_DEPARTMENT_OTHER): Payer: Self-pay

## 2023-10-22 DIAGNOSIS — Z719 Counseling, unspecified: Secondary | ICD-10-CM

## 2023-10-22 DIAGNOSIS — N644 Mastodynia: Secondary | ICD-10-CM | POA: Diagnosis not present

## 2023-10-22 DIAGNOSIS — Z9889 Other specified postprocedural states: Secondary | ICD-10-CM

## 2023-10-22 NOTE — Progress Notes (Signed)
   Subjective:    Patient ID: Brenda [name], female    DOB: 09/26/1950, 73 y.o.   MRN: [id number]  The patient is a 73 year old female here for evaluation of her breasts.  The patient has had at least 2 breast reductions in the past.  She then had some revisional surgery due to some fat necrosis.  The most recent one was in 2022.  The patient has been doing well since then but in the recent few months noticed some pain.  She has some point tenderness that feels like it is rib on the lateral right breast.  The patient wants to be sure this is not anything of concern.  It has a little bump there but I think it is mostly rib.  She does her mammograms yearly and is not due for one for another couple of months.      Review of Systems  Constitutional:  Positive for activity change.  HENT: Negative.    Eyes: Negative.   Respiratory: Negative.    Cardiovascular: Negative.   Gastrointestinal: Negative.   Genitourinary: Negative.   Musculoskeletal: Negative.   Skin: Negative.        Objective:   Physical Exam HENT:     Head: Atraumatic.   Cardiovascular:     Rate and Rhythm: Normal rate.     Pulses: Normal pulses.  Pulmonary:     Effort: Pulmonary effort is normal.   Musculoskeletal:        General: Tenderness present. No swelling.   Skin:    General: Skin is warm.     Capillary Refill: Capillary refill takes less than 2 seconds.     Coloration: Skin is not jaundiced.     Findings: No bruising.   Neurological:     Mental Status: She is oriented to person, place, and time.   Psychiatric:        Mood and Affect: Mood normal.        Behavior: Behavior normal.        Thought Content: Thought content normal.        Judgment: Judgment normal.         Assessment & Plan:     ICD-10-CM   1. Status post bilateral breast reduction  Z98.890     2. Breast pain  N64.4       Ultrasound for evaluation of tenderness and bump on right lateral breast.  Will plan on doing a  televisit after the ultrasound to discuss the results.  Pain management might be an option for injection if it is getting any worse.

## 2023-10-28 ENCOUNTER — Other Ambulatory Visit: Payer: Self-pay | Admitting: Plastic Surgery

## 2023-10-28 DIAGNOSIS — Z1231 Encounter for screening mammogram for malignant neoplasm of breast: Secondary | ICD-10-CM

## 2023-10-28 DIAGNOSIS — N644 Mastodynia: Secondary | ICD-10-CM

## 2023-10-30 ENCOUNTER — Encounter (INDEPENDENT_AMBULATORY_CARE_PROVIDER_SITE_OTHER): Payer: Self-pay | Admitting: Family Medicine

## 2023-10-30 ENCOUNTER — Ambulatory Visit (INDEPENDENT_AMBULATORY_CARE_PROVIDER_SITE_OTHER): Admitting: Family Medicine

## 2023-10-30 VITALS — BP 129/78 | HR 59 | Temp 97.4°F | Ht 59.0 in | Wt 139.0 lb

## 2023-10-30 DIAGNOSIS — Z7984 Long term (current) use of oral hypoglycemic drugs: Secondary | ICD-10-CM | POA: Diagnosis not present

## 2023-10-30 DIAGNOSIS — Z6828 Body mass index (BMI) 28.0-28.9, adult: Secondary | ICD-10-CM

## 2023-10-30 DIAGNOSIS — E1122 Type 2 diabetes mellitus with diabetic chronic kidney disease: Secondary | ICD-10-CM

## 2023-10-30 DIAGNOSIS — R7303 Prediabetes: Secondary | ICD-10-CM

## 2023-10-30 DIAGNOSIS — N1831 Chronic kidney disease, stage 3a: Secondary | ICD-10-CM | POA: Diagnosis not present

## 2023-10-30 DIAGNOSIS — E669 Obesity, unspecified: Secondary | ICD-10-CM

## 2023-10-30 NOTE — Progress Notes (Signed)
 Office: [phone]  /  Fax: [phone]  WEIGHT SUMMARY AND BIOMETRICS  Anthropometric Measurements Height: 4' 11 (1.499 m) Weight: 139 lb (63 kg) BMI (Calculated): 28.06 Weight at Last Visit: 139 lb Weight Lost Since Last Visit: 0 Weight Gained Since Last Visit: 0 Starting Weight: 147 lb Total Weight Loss (lbs): 8 lb (3.629 kg) Peak Weight: 151 lb   Body Composition  Body Fat %: 40 % Fat Mass (lbs): 55.8 lbs Muscle Mass (lbs): 79.6 lbs Total Body Water  (lbs): 56 lbs Visceral Fat Rating : 11   Other Clinical Data Fasting: No Labs: No Today's Visit #: 25 Starting Date: 08/03/21    Chief Complaint: OBESITY   History of Present Illness Brenda [name] is a 73 year old female who presents for a follow-up on her treatment plan and progress.  She was prescribed a journaling plan with a caloric intake of 1000 to 1200 calories and a protein intake of 75 or more grams per day. She adheres to this plan about 50% of the time. Her current exercise routine includes walking for 10 minutes, two to three times per week. She maintains her weight at 130 pounds with a BMI of 28.2. She is not currently journaling her food intake but is mindful of her diet, focusing on portion control and protein intake.  She feels exhausted, particularly due to her work schedule, which includes shifts of eight hours, resulting in over 8000 steps per day. She has difficulty keeping up with the demands of her job at her age.  She is currently taking several supplements, including magnesium, a multivitamin with brain-boosting effects, cinnamon, ginger, and vitamin D . She has discontinued calcium and is considering calcium with K2.  Her blood sugar levels at home are generally stable, with fasting levels around 95 to 110 mg/dL and postprandial levels around 140 mg/dL. Her A1c was 5.7% at her primary care provider's office, compared to 6.2% at this office. She is mindful of her carbohydrate intake and is  working on maintaining her blood sugar levels.  She mentions a GFR of 49 and is mindful of her potassium intake to protect her kidneys. She continues to monitor her kidney function and hydration.      PHYSICAL EXAM:  Blood pressure 129/78, pulse (!) 59, temperature (!) 97.4 F (36.3 C), height 4' 11 (1.499 m), weight 139 lb (63 kg), SpO2 98%. Body mass index is 28.07 kg/m.  DIAGNOSTIC DATA REVIEWED:  BMET    Component Value Date/Time   NA 142 07/09/2023 0948   K 4.2 07/09/2023 0948   CL 103 07/09/2023 0948   CO2 20 07/09/2023 0948   GLUCOSE 82 07/09/2023 0948   GLUCOSE 93 02/28/2021 1050   BUN 23 07/09/2023 0948   CREATININE 1.19 (H) 07/09/2023 0948   CALCIUM 9.9 07/09/2023 0948   GFRNONAA 44.4 09/11/2022 1437   GFRAA >60 12/04/2016 1351   Lab Results  Component Value Date   HGBA1C 6.3 (H) 07/09/2023   HGBA1C 6.1 (H) 08/03/2021   Lab Results  Component Value Date   INSULIN  17.1 07/09/2023   INSULIN  4.9 08/03/2021   Lab Results  Component Value Date   TSH 2.170 12/19/2021   CBC    Component Value Date/Time   WBC 8.5 07/16/2023 1257   RBC 4.45 07/16/2023 1257   HGB 13.6 07/16/2023 1257   HGB 13.6 05/01/2022 1158   HCT 42.2 07/16/2023 1257   HCT 41.0 05/01/2022 1158   PLT 305 07/16/2023 1257   PLT 259  05/01/2022 1158   MCV 94.8 07/16/2023 1257   MCV 93 05/01/2022 1158   MCH 30.6 07/16/2023 1257   MCHC 32.2 07/16/2023 1257   RDW 14.1 07/16/2023 1257   RDW 14.2 05/01/2022 1158   Iron Studies No results found for: IRON, TIBC, FERRITIN, IRONPCTSAT Lipid Panel     Component Value Date/Time   CHOL 154 07/09/2023 0948   TRIG 89 07/09/2023 0948   HDL 79 07/09/2023 0948   LDLCALC 59 07/09/2023 0948   Hepatic Function Panel     Component Value Date/Time   PROT 6.6 07/09/2023 0948   ALBUMIN 4.4 07/09/2023 0948   AST 21 07/09/2023 0948   ALT 25 07/09/2023 0948   ALKPHOS 93 07/09/2023 0948   BILITOT 0.5 07/09/2023 0948      Component Value  Date/Time   TSH 2.170 12/19/2021 0920   Nutritional Lab Results  Component Value Date   VD25OH 74.6 07/09/2023   VD25OH 72.2 12/11/2022   VD25OH 33.5 09/11/2022     Assessment and Plan Assessment & Plan Type 2 Diabetes Mellitus A1c improved to 5.7 from 6.2. Blood glucose levels at home are typically 90-110 mg/dL, occasionally higher postprandially. She is mindful of carbohydrate intake and maintaining blood sugar levels. - Continue home blood glucose monitoring - Maintain current dietary practices - Review external blood work at next visit  Obesity Adheres to diet plan 50% of the time, consuming 1000-1200 calories and 75+ grams of protein. Engages in walking 10 minutes, 2-3 times weekly. Weight stable at 130 pounds, BMI 28.2. She is considering reducing work hours to manage fatigue and stress, impacting exercise routine. - Continue diet plan with 1000-1200 calories and 75+ grams of protein - Encourage regular physical activity - Consider reducing work hours to manage fatigue and stress - Follow-up in three months to reassess weight management  Chronic Kidney Disease GFR is 49, indicating stage 3a CKD. Advised to avoid excessive potassium intake. Last potassium level was normal. Encouraged to maintain adequate hydration. - Regular monitoring of kidney function - Avoid excessive potassium intake - Encourage adequate hydration  General Health Maintenance Taking supplements: magnesium, multivitamins, cinnamon, ginger, vitamin D . Considering calcium with K2 to prevent vascular calcification. Due for mammogram and ultrasound due to issues. Advised to discuss calcium and K2 supplementation with primary care provider. - No obvious contraindications to her supplements noted - Schedule mammogram and ultrasound - Discuss supplementation with primary care provider at follow up visit  I have personally spent 35 minutes total time today in preparation, patient care, and documentation for  this visit, including the following: review of clinical lab tests; review of medical tests/procedures/services and nutritional counseling      She was informed of the importance of frequent follow up visits to maximize her success with intensive lifestyle modifications for her multiple health conditions.    Louann Penton, MD

## 2023-10-31 DIAGNOSIS — N3010 Interstitial cystitis (chronic) without hematuria: Secondary | ICD-10-CM | POA: Diagnosis not present

## 2023-11-03 ENCOUNTER — Other Ambulatory Visit (INDEPENDENT_AMBULATORY_CARE_PROVIDER_SITE_OTHER): Payer: Self-pay | Admitting: Family Medicine

## 2023-11-03 DIAGNOSIS — R7303 Prediabetes: Secondary | ICD-10-CM

## 2023-11-05 ENCOUNTER — Ambulatory Visit
Admission: RE | Admit: 2023-11-05 | Discharge: 2023-11-05 | Disposition: A | Discharge status: Home / Self Care | Source: Ambulatory Visit | Attending: Plastic Surgery | Admitting: Plastic Surgery

## 2023-11-05 ENCOUNTER — Other Ambulatory Visit: Payer: Self-pay | Admitting: Plastic Surgery

## 2023-11-05 ENCOUNTER — Ambulatory Visit

## 2023-11-05 DIAGNOSIS — N644 Mastodynia: Secondary | ICD-10-CM

## 2023-11-05 DIAGNOSIS — Z1231 Encounter for screening mammogram for malignant neoplasm of breast: Secondary | ICD-10-CM

## 2023-11-05 DIAGNOSIS — N641 Fat necrosis of breast: Secondary | ICD-10-CM | POA: Diagnosis not present

## 2023-11-05 DIAGNOSIS — L820 Inflamed seborrheic keratosis: Secondary | ICD-10-CM | POA: Diagnosis not present

## 2023-11-19 ENCOUNTER — Ambulatory Visit: Admitting: Plastic Surgery

## 2023-11-19 ENCOUNTER — Encounter: Payer: Self-pay | Admitting: Plastic Surgery

## 2023-11-19 DIAGNOSIS — N644 Mastodynia: Secondary | ICD-10-CM

## 2023-11-19 NOTE — Progress Notes (Signed)
   Subjective:    Patient ID: Brenda [name], female    DOB: [birth year], 73 y.o.   MRN: [id number]  The patient is a 73 year old female joining me on phone for discussion about her breast.  She had some residual surgery with fat grafting and likely had some fat necrosis.  She was doing well but on her last exam in June she had a bump on the chest that she thought was concerning on the right side.  It felt like her ribs but due to her history we went ahead and did imaging to be sure.  The mammogram showed fat necrosis in the central retroareolar right breast with some tethering behind the nipple which may be the cause of the feeling of pulling.  There was no evidence of malignancy noted on either side.      Review of Systems  Constitutional: Negative.   Eyes: Negative.   Respiratory: Negative.    Cardiovascular: Negative.        Objective:   Physical Exam       Assessment & Plan:     ICD-10-CM   1. Breast pain  N64.4        I connected with  Brenda [name] on 11/19/23 by phone and verified that I am speaking with the correct person using two identifiers.  The patient was at home making a peach pie and I was at the office.  We discussed the results.  The patient said that she can live with the discomfort right now.  She knows if it gets any worse to let us  know.  We spent 5 minutes in discussion.   I discussed the limitations of evaluation and management by telemedicine. The patient expressed understanding and agreed to proceed.

## 2023-11-22 DIAGNOSIS — N3010 Interstitial cystitis (chronic) without hematuria: Secondary | ICD-10-CM | POA: Diagnosis not present

## 2023-12-03 DIAGNOSIS — R000 Tachycardia, unspecified: Secondary | ICD-10-CM | POA: Diagnosis not present

## 2023-12-03 DIAGNOSIS — I129 Hypertensive chronic kidney disease with stage 1 through stage 4 chronic kidney disease, or unspecified chronic kidney disease: Secondary | ICD-10-CM | POA: Diagnosis not present

## 2023-12-03 DIAGNOSIS — R42 Dizziness and giddiness: Secondary | ICD-10-CM | POA: Diagnosis not present

## 2023-12-03 DIAGNOSIS — R0609 Other forms of dyspnea: Secondary | ICD-10-CM | POA: Diagnosis not present

## 2023-12-03 DIAGNOSIS — N1831 Chronic kidney disease, stage 3a: Secondary | ICD-10-CM | POA: Diagnosis not present

## 2023-12-05 ENCOUNTER — Other Ambulatory Visit (INDEPENDENT_AMBULATORY_CARE_PROVIDER_SITE_OTHER): Payer: Self-pay

## 2023-12-05 ENCOUNTER — Telehealth (INDEPENDENT_AMBULATORY_CARE_PROVIDER_SITE_OTHER): Payer: Self-pay | Admitting: Family Medicine

## 2023-12-05 DIAGNOSIS — R7303 Prediabetes: Secondary | ICD-10-CM

## 2023-12-05 MED ORDER — ONETOUCH ULTRA VI STRP: ORAL_STRIP | 0 refills | Status: DC

## 2023-12-05 NOTE — Telephone Encounter (Signed)
 Good afternoon!  She requested a refill on the blood sugar strips sent to [facility]! I didn't know if this required an appointment so I scheduled her for Monday because her next follow up was in October. If she doesn't need the appointment I will call and cancel for her. Let me know how to proceed.  She also said she stopped the metformin  because the gastro discomfort was too much.   Thanks!

## 2023-12-07 NOTE — Progress Notes (Deleted)
 Cardiology Office Note:    Date:  12/07/2023   ID:  Brenda [name], DOB 27-Jun-1950, MRN [id number]  PCP:  Janey Santos, MD  Cardiologist:  Jerel Balding, MD { Click to update primary MD,subspecialty MD or APP then REFRESH:1}    Referring MD: Verta Izetta HERO, NP   Chief Complaint: shortness of breath and palpitations ***  History of Present Illness:    Brenda [name] is a 73 y.o. female with a history of normal coronaries on coronary CTA in 10/2020, remote CVAs in 1996 and 2009, hypertension, hyperlipidemia, prediabetes, CKD stage III, chronic cough, GERD, spasm of sphincter of Oddi, IBS, fibromyalgia, interstitial cystitis, insomnia, and anxiety/ depression who is followed by Dr. Balding and presents today for evaluation of shortness of breath and palpitations.   Patient was initially referred to Dr. Balding in 10/2020 for further evaluation of dyspnea and chest pain.  Echo and coronary CTA were ordered for further evaluation. Echo showed LVEF of 60-65% with normal wall motion and grade 1 diastolic dysfunction, noraml RV function, and no significant valvular disease. Coronary CTA showed a coronary calcium score of 0 with a mild LAD myocardial bridge (normal variant) but no evidence of CAD.   She was last seen by Dr. Balding in 02/2021 at which time she continued to have mild shortness of breath when walking faster than usual but no recent chest pain. She was noted to have carotid bruits on exam so carotid dopplers were ordered and showed no evidence of carotid stenosis.   Patient presents today for ***  Shortness of Breath ***  Palpitations ***  Hypertension BP well controlled. *** - Continue current medications: Losartan 100mg  daily and Lopressor 25mg  twice daily.   Hyperlipidemia Lipid panel in 06/2023: Total Cholesterol 154, Triglycerides 89, HDL 79, LDL 59.  - Intolerant to statin due to abnormal LFTs.  - Continue Zetia 10mg  daily and Repatha. - Followed by PCP.    Prediabetes Hemoglobin A1c 6.3% in 06/2023.  - Management per PCP.   CKD Stage IIIa Baseline creatinine around 1.0 to 1.2.   EKGs/Labs/Other Studies Reviewed:    The following studies were reviewed:  Echocardiogram 11/10/2020: Impressions:  1. Left ventricular ejection fraction, by estimation, is 60 to 65%. The  left ventricle has normal function. The left ventricle has no regional  wall motion abnormalities. Left ventricular diastolic parameters are  consistent with Grade I diastolic  dysfunction (impaired relaxation). The average left ventricular global  longitudinal strain is -23.9 %. The global longitudinal strain is normal.   2. Right ventricular systolic function is normal. The right ventricular  size is normal. A Prominent moderator band is visualized. There is normal  pulmonary artery systolic pressure.   3. The mitral valve is normal in structure. Trivial mitral valve  regurgitation. No evidence of mitral stenosis.   4. The aortic valve is tricuspid. Aortic valve regurgitation is not  visualized. Mild aortic valve sclerosis is present, with no evidence of  aortic valve stenosis.   5. The inferior vena cava is normal in size with greater than 50%  respiratory variability, suggesting right atrial pressure of 3 mmHg.  _______________  Coronary CTA 11/23/2020: Impressions: 1. Coronary calcium score of 0. 2. Normal coronary origin with right dominance. 3. Normal coronary arteries. 4. 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 _______________  Carotid Dopplers 04/27/2021: Summary:  - Right Carotid: There is no evidence of stenosis in the right ICA.  - Left Carotid: There is no evidence  of stenosis in the left ICA.  - Vertebrals:  Bilateral vertebral arteries demonstrate antegrade flow.  - Subclavians: Normal flow hemodynamics were seen in bilateral subclavian arteries.    EKG:  EKG ordered today. EKG personally reviewed and demonstrates ***.  Recent  Labs: 07/09/2023: ALT 25; BUN 23; Creatinine, Ser 1.19; Potassium 4.2; Sodium 142 07/16/2023: Hemoglobin 13.6; Platelets 305  Recent Lipid Panel    Component Value Date/Time   CHOL 154 07/09/2023 0948   TRIG 89 07/09/2023 0948   HDL 79 07/09/2023 0948   LDLCALC 59 07/09/2023 0948    Physical Exam:    Vital Signs: There were no vitals taken for this visit.    Wt Readings from Last 3 Encounters:  10/30/23 139 lb (63 kg)  09/03/23 139 lb (63 kg)  07/19/23 139 lb (63 kg)     General: 73 y.o. female in no acute distress. HEENT: Normocephalic and atraumatic. Sclera clear.  Neck: Supple. No carotid bruits. No JVD. Heart: *** RRR. Distinct S1 and S2. No murmurs, gallops, or rubs.  Lungs: No increased work of breathing. Clear to ausculation bilaterally. No wheezes, rhonchi, or rales.  Abdomen: Soft, non-distended, and non-tender to palpation.  Extremities: No lower extremity edema.  Radial and distal pedal pulses 2+ and equal bilaterally. Skin: Warm and dry. Neuro: No focal deficits. Psych: Normal affect. Responds appropriately.   Assessment:    No diagnosis found.  Plan:     Disposition: Follow up in ***   Signed, Aline FORBES Door, PA-C  12/07/2023 3:18 PM     [facility]

## 2023-12-09 ENCOUNTER — Ambulatory Visit (INDEPENDENT_AMBULATORY_CARE_PROVIDER_SITE_OTHER): Admitting: Family Medicine

## 2023-12-09 ENCOUNTER — Other Ambulatory Visit (INDEPENDENT_AMBULATORY_CARE_PROVIDER_SITE_OTHER): Payer: Self-pay

## 2023-12-09 DIAGNOSIS — R7303 Prediabetes: Secondary | ICD-10-CM

## 2023-12-09 MED ORDER — ONETOUCH ULTRA VI STRP: ORAL_STRIP | 0 refills | Status: DC

## 2023-12-13 DIAGNOSIS — N3010 Interstitial cystitis (chronic) without hematuria: Secondary | ICD-10-CM | POA: Diagnosis not present

## 2023-12-16 NOTE — Progress Notes (Unsigned)
 Cardiology Office Note:    Date:  12/17/2023   ID:  Brenda [name], DOB Jan 16, 1951, MRN [id number]  PCP:  Janey Santos, MD   [facility] Providers Cardiologist:  Jerel Balding, MD { Click to update primary MD,subspecialty MD or APP then REFRESH:1}    Referring MD: Verta Izetta HERO, NP   Chief complaint: Evaluation for DOE  History of Present Illness:    Brenda [name] is a 73 y.o. 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 Balding with a gradually worsening exercise tolerance due to dyspnea and chest tightness in 2022.  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  Echocardiogram on 11/10/2020 showed LVEF 60-65%, no RWMA, G1 DD, trivial mitral valve regurgitation, mild aortic valve sclerosis present, no evidence of aortic valve stenosis.  Carotid ultrasound on 04/27/2021 performed for bilateral carotid bruits showed no evidence of stenosis bilaterally in carotid, bilateral vertebral arteries demonstrated antegrade flow, normal flow to bilateral subclavian's.  Coronary CTA on 11/23/2020 showed a coronary calcium score of 0, normal coronary origin with right dominance, normal coronary arteries, mid LAD myocardial bridge (normal variant).  Bilateral LE Doppler on 12/21/2022 showed no evidence of DVT in RLE, no evidence of common vein obstruction on the left.   Does not tolerate statins due to liver function test abnormalities, was on Zetia and Repatha.  Has asthma, tolerates metoprolol wean dose to 25 mg twice daily.  MRIs have shown silent strokes in 1996 and in 2009.  Dr. Balding found no evidence of structural cardiac abnormalities to explain the exertional dyspnea, recommended follow-up with  pulmonology.  PFTs performed 2015-2019 showed borderline FVC reduction at 85% of predicted, normal FEV1.  Brenda [name] is a pleasant 73 year old female presenting to the office today with complaints of worsening shortness of breath with exertion, noticing it primarily at work when physically exerting herself. Describes increased fatigue during ADL's around the same time she noticed the SOB. Occasionally feels the need to take a deep breath at rest, but wouldn't say she feels SOB at rest. Also reports occasional lightheadedness with positional changes, worse when dehydrated, hx of orthostatic hypotension.  PCP Haft her dose of losartan from 100 mg daily to 50 mg daily 2 weeks ago, patient states this has improved her lightheadedness, BP log scanned into chart from last 2 weeks (Max: 143/98, Min: 96/70).  Occasionally feels palpitations, states that episodes feel like a skipped beat or 2, never lasts more than a few seconds.  Has not noticed any arrhythmias on her Apple Watch with EKG function.  Denies chest pain, dizziness, near-syncope, nausea, dark/bloody stools, leg swelling, cough, hemoptysis.  PCP stopped her metformin  2 weeks ago, monitoring CBG 3 times daily, ranging 90-160 depending on meals, patient states last A1c 5.7.   Past Medical History:  Diagnosis Date   Bilateral carotid bruits    followed by cardiologist   Bladder pain    Cervical disc disease    Chronic cough    followed by dr wert and ENT dr garnette silvan   Chronic kidney disease (CKD), stage III (moderate) (HCC)    Chronic seasonal allergic rhinitis    Dyspnea    with exertion    Fibromyalgia    Frequency of urination    GAD (  generalized anxiety disorder)    GERD (gastroesophageal reflux disease)    Heart murmur    cardiologist--- dr croitoru   History of pancreatitis 1988-1989   History of panic attacks    History of stroke 1996   mild without residuals;  per MRI imaging 2010 left thalamic infarct without residual    Hyperlipidemia, mixed    Interstitial cystitis    urologist-- dr watt   Irritable bowel syndrome with both constipation and diarrhea    followed by dr avram   Labile hypertension    followed by pcp;   hx normal nuclear stress test in 2009;   coronary CT 11-23-2020  normal coronaies, calcium score zero   Lumbar spondylosis    MDD (major depressive disorder)    Migraines    Moderate persistent asthma    followed by pcp and dr wert    Multiple pulmonary nodules    pulmonologist-- dr wert   Osteoarthritis    Palpitations    Panic disorder with agoraphobia    PONV (postoperative nausea and vomiting)    Pre-diabetes    Spasm of sphincter of Oddi 1987   02-13-2019  per pt no issues since she watches what medication's she takes   Stroke [facility])    Urethral stenosis    s/p multiple dilatations   Urgency of urination    Vitamin D  deficiency    Wears glasses     Past Surgical History:  Procedure Laterality Date   ABDOMINAL HYSTERECTOMY  1988   COMPLETE   APPENDECTOMY  07/1984   bilateral reduction mastopexy  1988   bilateral turbinate resection  1990   nasal spreading graft   BREAST CYST EXCISION Bilateral 03/18/2019   Procedure: excision necrotic breast tissue left ;  Surgeon: Lowery Estefana RAMAN, DO;  Location: [facility];  Service: Plastics;  Laterality: Bilateral;   BREAST CYST EXCISION Bilateral 06/30/2020   Procedure: Excision of right breast fat necrosis and left inferior breast;  Surgeon: Lowery Estefana RAMAN, DO;  Location: [facility];  Service: Plastics;  Laterality: Bilateral;   BREAST REDUCTION SURGERY  10/17/2018   Procedure: bilateral breast reduction;  Surgeon: Lowery Estefana RAMAN, DO;  Location: [facility];  Service: Plastics;;  please adjust time to reflect 4 hours   CATARACT EXTRACTION W/ INTRAOCULAR LENS  IMPLANT, BILATERAL  08 and 09/ 2017   CHOLECYSTECTOMY  07/1984   COLONOSCOPY  02/2004, 11/2010, LAST 03-2016 ENDO  DONE ALSO   2005: Normal - Dr. Nolon 2012: Normal   CYSTO WITH HYDRODISTENSION N/A 12/17/2013   Procedure: CYSTO HYDRODISTENSION WITH INSTALLATION OF CHLOROPACTIN AND TETRACAINE ;  Surgeon: Norleen JINNY watt, MD;  Location[facility];  Service: Urology;  Laterality: N/A;   CYSTO WITH HYDRODISTENSION N/A 06/02/2015   Procedure: CYSTOSCOPY/HYDRODISTENSION WITH INSTILLATION OF CLORPACTIN ;  Surgeon: Norleen watt, MD;  Location: [facility];  Service: Urology;  Laterality: N/A;   CYSTO WITH HYDRODISTENSION N/A 11/08/2016   Procedure: CYSTOSCOPY/HYDRODISTENSION INSTILLATION OF CLORPACTIN;  Surgeon: watt Norleen, MD;  Location: [facility];  Service: Urology;  Laterality: N/A;   CYSTO WITH HYDRODISTENSION N/A 03/06/2018   Procedure: CYSTOSCOPY/HYDRODISTENSION INSTILL CHLORPACTION;  Surgeon: watt Norleen, MD;  Location[facility];  Service: Urology;  Laterality: N/A;   CYSTO WITH HYDRODISTENSION N/A 04/02/2019   Procedure: CYSTOSCOPY/HYDRODISTENSION INSTILL CHLORPACTIN;  Surgeon: watt Norleen, MD;  Location: [facility];  Service: Urology;  Laterality: N/A;   CYSTO WITH HYDRODISTENSION N/A  02/17/2020   Procedure: CYSTOSCOPY/HYDRODISTENTION WITH INSTILLATION OF CHLORPACTIN;  Surgeon: Watt Rush, MD;  Location: [facility];  Service: Urology;  Laterality: N/A;  ONLY NEEDS 30 MIN   CYSTO WITH HYDRODISTENSION N/A 02/28/2021   Procedure: CYSTOSCOPY/HYDRODISTENSION INSTILL CLORPACTION;  Surgeon: Watt Rush, MD;  Location: [facility];  Service: Urology;  Laterality: N/A;   CYSTO WITH HYDRODISTENSION N/A 05/04/2022   Procedure: CYSTOSCOPY/HYDRODISTENSION INSTILL CLORPACTIN AND TETRACAINE ;  Surgeon: Watt Rush, MD;  Location: [facility];  Service: Urology;  Laterality: N/A;   CYSTOSCOPY WITH HYDRODISTENSION AND BIOPSY N/A 07/19/2023   Procedure: CYSTOSCOPY, WITH BLADDER HYDRODISTENSION;  Surgeon:  Watt Rush, MD;  Location: [facility];  Service: Urology;  Laterality: N/A;   CYSTOSCOPY WITH URETHRAL DILATATION  07/12/2011   Procedure: CYSTOSCOPY WITH URETHRAL DILATATION;  Surgeon: Rush JINNY Watt, MD;  Location: [facility];  Service: Urology;  Laterality: N/A;  HOD AND INSTILLATION OF CHLOROPACTIN C-ARM    CYSTOSCOPY WITH URETHRAL DILATATION N/A 06/26/2012   Procedure: CYSTOSCOPY WITH URETHRAL DILATATION HYDRODISTENSION AND INSTILLATION OF CLORPACTION ;  Surgeon: Rush JINNY Watt, MD;  Location: [facility];  Service: Urology;  Laterality: N/A;  CYSTOSCOPY WITH URETHRAL DILATATION HYDRODISTENSION AND INSTILLATION OF CLORPACTION    CYSTOSTOMY W/ BLADDER BIOPSY  1983   DG BARIUM SWALLOW ([facility] HX)  03/2016   DILATION AND CURETTAGE OF UTERUS  1987   ERCP  1988   for pancreatitis with stents   S/P SEVERE PANCREATITIS   HEMORRHOID SURGERY     LASIK Bilateral 07/2003   LIPOSUCTION Bilateral 10/17/2018   Procedure: LIPOSUCTION;  Surgeon: Lowery Estefana RAMAN, DO;  Location: [facility];  Service: Plastics;  Laterality: Bilateral;   LIPOSUCTION Right 03/18/2019   Procedure: excision right necrotic breast tissue with liposuction;  Surgeon: Lowery Estefana RAMAN, DO;  Location: [facility];  Service: Plastics;  Laterality: Right;   LIPOSUCTION Bilateral 06/30/2020   Procedure: Bilateral liposuction of lateral breast;  Surgeon: Lowery Estefana RAMAN, DO;  Location: [facility];  Service: Plastics;  Laterality: Bilateral;   MULTIPLE CYSTO/ HOD/ URETHRAL DILATION/ INSTILLATION CLORPACTIN  .LAST ONE 03-31-2010   NASAL SINUS SURGERY  1977   sinus cyst   REDUCTION MAMMAPLASTY Bilateral 1988   REDUCTION MAMMAPLASTY Bilateral 09/2018   REDUCTION MAMMAPLASTY Bilateral 03/2019   revision rhinoplasty  1978   RHINOPLASTY  1977   RIGHT SALPINGOOPHECTOMY  1998   TONSILLECTOMY AND ADENOIDECTOMY  1963   VIDEO BRONCHOSCOPY Bilateral 08/23/2016    Procedure: VIDEO BRONCHOSCOPY WITHOUT FLUORO;  Surgeon: Tonnie FORBES Flicker, MD;  Location: [facility];  Service: Cardiopulmonary;  Laterality: Bilateral;    Current Medications: Current Meds  Medication Sig   acetaminophen  (TYLENOL ) 325 MG tablet Take 500 mg by mouth every 6 (six) hours as needed (for headaches).    albuterol  (VENTOLIN  HFA) 108 (90 Base) MCG/ACT inhaler Inhale 2 puffs into the lungs every 6 (six) hours as needed for wheezing (Asthma).   AMBULATORY NON FORMULARY MEDICATION Medication Name: Diltiazem 2% gel mixed with Lidocaine  5% Apply a pea size amount to rectum 2-3 times daily. (Patient taking differently: Apply topically as needed. Medication Name: Diltiazem 2% gel mixed with Lidocaine  5% Apply a pea size amount to rectum 2-3 times daily.)   Blood Glucose Monitoring Suppl DEVI 1 each by Does not apply route once as needed for up to 1 dose. May substitute to any manufacturer covered by patient's insurance.   Cholecalciferol (VITAMIN D3) 50  MCG (2000 UT) capsule Take 2 capsules (4,000 Units total) by mouth daily. (Patient taking differently: Take 2,000 Units by mouth daily.)   conjugated estrogens (PREMARIN) vaginal cream Place 1 applicator vaginally 2 (two) times a week.   cyclobenzaprine  (FLEXERIL ) 10 MG tablet Take 1 tablet by mouth 3 (three) times daily as needed.   estradiol (ESTRACE) 0.1 MG/GM vaginal cream Place 1 Applicatorful vaginally 2 (two) times a week.   ezetimibe (ZETIA) 10 MG tablet Take 10 mg by mouth at bedtime.   famotidine (PEPCID) 20 MG tablet Take 20 mg by mouth at bedtime as needed for heartburn or indigestion.   FLUoxetine  (PROZAC ) 10 MG capsule Take 1 capsule (10 mg total) by mouth daily.   fluticasone  (FLONASE) 50 MCG/ACT nasal spray Place 2 sprays into both nostrils daily as needed for allergies.   folic acid (FOLVITE) 1 MG tablet Take 1 mg by mouth daily.   glucose blood (ONETOUCH ULTRA) test strip Testing two times per day.   Lancets (ONETOUCH  DELICA PLUS LANCET33G) MISC check blood sugar in THE morning, AT NOON, AND AT bedtime.   LORazepam  (ATIVAN ) 1 MG tablet TAKE 1 TABLET EVERY 8 HOURS AS NEEDED FOR ANXIETY.   losartan (COZAAR) 100 MG tablet Take 100 mg by mouth in the morning. (Patient taking differently: Take 50 mg by mouth in the morning.)   meperidine  (DEMEROL ) 25 MG/ML injection Inject 1 mL (25 mg total) into the muscle daily as needed. Daily as needed for post op pain.   metoprolol (LOPRESSOR) 50 MG tablet Take 25 mg by mouth 2 (two) times daily.   Multiple Vitamins-Minerals (HAIR SKIN AND NAILS FORMULA PO) Take 1 tablet by mouth once a week.   pantoprazole (PROTONIX) 40 MG tablet Take 40 mg by mouth daily.   REPATHA SURECLICK 140 MG/ML SOAJ Inject 140 mg into the skin every 14 (fourteen) days.   zolpidem  (AMBIEN ) 5 MG tablet TAKE 1 TABLET AT BEDTIME AS NEEDED FOR INSOMNIA.     Allergies:   Codeine, Latex, Stadol [butorphanol tartrate], Adhesive [tape], Bupivacaine , Butorphanol tartrate, Clarithromycin, Cymbalta [duloxetine hcl], Elavil [amitriptyline hcl], Gabapentin , Hydrocodone, Hydromorphone hcl, Levbid [hyoscyamine sulfate], Lisinopril, Penicillin g sodium, Pentazocine lactate, Percodan [oxycodone -aspirin], Propofol , Trazodone and nefazodone, Vistaril [hydroxyzine hcl], Wound dressing adhesive, and Morphine  and codeine   Social History   Socioeconomic History   Marital status: Married    Spouse name: Not on file   Number of children: 0   Years of education: 16   Highest education level: Not on file  Occupational History   Occupation: RN  Tobacco Use   Smoking status: Never   Smokeless tobacco: Never  Vaping Use   Vaping status: Never Used  Substance and Sexual Activity   Alcohol  use: No   Drug use: No   Sexual activity: Not on file  Other Topics Concern   Not on file  Social History Narrative   Married, lives with spouse   No children   RN at [employer]   No recent travel      [facility]:    Originally from [place]. Always lived in [place]. Previously has traveled to [place] , [place], [place], [place] , & mostly [address]. No indoor pets currently. She does have cats in her garage. No bird, mold, or hot tub exposure. No indoor plants. Mostly wood floors. She did have her bedroom carpet taken up in Summer 2017. Has mostly blinds. No wood burning fire place. Previously enjoyed Archivist & playing piano.    Social  Drivers of Corporate investment banker Strain: Not on file  Food Insecurity: Not on file  Transportation Needs: Not on file  Physical Activity: Not on file  Stress: Not on file  Social Connections: Not on file     Family History: The patient's family history includes Allergies in her mother; Anxiety disorder in her father; Asthma in her mother; Brain cancer in her father; Colon cancer in her cousin and maternal grandmother; Colon polyps in her father; Diabetes in her mother and sister; Heart disease in her father, mother, and sister; High Cholesterol in her father; High blood pressure in her mother; Irritable bowel syndrome in her mother and sister; Melanoma in her sister; Obesity in her mother; Osteoarthritis in her father, mother, and sister; Stroke in her mother and sister; Ulcerative colitis in her maternal uncle. There is no history of Stomach cancer, Rheumatologic disease, Esophageal cancer, or Pancreatic cancer.  ROS:   Please see the history of present illness.    All other systems reviewed and are negative.  EKGs/Labs/Other Studies Reviewed:    The following studies were reviewed today:     Past Orders:        Lab:CBC (Order Date - 12/03/2023) (Collection Date & Time - 12/03/2023 11:56 AM)           Value Reference Range          WBC 7.40   4.10 - 10.90 - K/uL          LYM 2.0   0.6 - 4.1 - K/uL          EOS 0.1   0.0 - 0.4 -          BASO 0.1   0.0 - 0.2 -          LYM% 27.5   10.0 - 58.5 - %          EOS% 1.9   0.0 - 7.8 -          BASO% 1.1   0.0 - 2.0 -           RBC 4.3   4.2 - 6.3 - M/uL          HGB 13.2   12.0 - 18.0 - g/dL          HCT 61.2   62.9 - 51.0 - %          MCV 90.2   80.0 - 97.0 - fL          MCH 30.7   26.0 - 32.0 - pg          MCHC 34.1   31.0 - 36.0 - g/dL          RDW 86.0   87.6 - 15.4 -          PLT 280   140 - 440 - K/uL          MPV 8.3   6.9 - 10.6 -          NEU 4.5   1.4 - 7.0 -          NEU% 60.8   43.3 - 71.9 -          MONO 0.6   0.1 - 0.9 -          MONO% 8.7   4.6 - 12.4 -          Notes: Verta Pagan 12/03/2023 12:08:05 PM > CBC without infection marker  elevation, no anemia     Oja:AJDPR METABOLIC PANEL (Order Date - 12/03/2023) (Collection Date & Time - 12/03/2023 11:56 AM)           Value Reference Range          GLUCOSE 99   60 - 110 - mg/dl          BUN 16   5 - 23 - mg/dl          CREATININE 1.2   0.6 - 1.3 - mg/dl          eGFR Non-African American 44.3   < -          eGFR African American 53.6   < -          SODIUM 142   135 - 148 - mEq/L          POTASSIUM 4.7   3.5 - 5.3 - mEq/L          CHLORIDE 108   80 - 111 - mEq/L          CO2 28   15 - 35 - mEq/L          CALCIUM 10.0   7.0 - 10.5 - mg/dL          Notes: Verta Pagan 12/03/2023 12:22:47 PM > kidney function is normal, no electrolyte imbalances.     Lab:TSH (Order Date - 12/03/2023) (Collection Date & Time - 12/03/2023 11:56 AM)           Value Reference Range          TSH 0.96   0.40 - 5.33 - uIU/ml   Obtained 09/17/2023:        TOTAL PROTEIN 6.2   6.0 - 8.5 - g/dL          ALBUMIN 4.0   2.0 - 5.5 - g/dL          AST 17   7 - 45 - IU/L          ALT 24   5 - 40 - IU/L   EKG Interpretation Date/Time:  Tuesday December 17 2023 14:40:41 EDT Ventricular Rate:  57 PR Interval:  146 QRS Duration:  76 QT Interval:  456 QTC Calculation: 443 R Axis:   -11  Text Interpretation: Sinus bradycardia Inferior infarct , age undetermined When compared with ECG of 16-Jul-2023 13:26, Borderline criteria for Anterior infarct are no longer Present  No significant change was found Confirmed by Anacarolina Evelyn [phone]) on 12/17/2023 2:57:51 PM    Recent Labs: 07/09/2023: ALT 25; BUN 23; Creatinine, Ser 1.19; Potassium 4.2; Sodium 142 07/16/2023: Hemoglobin 13.6; Platelets 305  Recent Lipid Panel    Component Value Date/Time   CHOL 154 07/09/2023 0948   TRIG 89 07/09/2023 0948   HDL 79 07/09/2023 0948   LDLCALC 59 07/09/2023 0948   ASCVD (Atherosclerotic Cardiovascular Disease) 2013 Risk Calculator from AHA/ACC from StatOfficial.co.za  on 12/17/2023 ** All calculations should be rechecked by clinician prior to use **  RESULT SUMMARY:   Moderate- to high-intensity statin recommended because 10-year risk >7.5%  To view statin dosages by intensity, see Evidence section.  12.8%   Risk of cardiovascular event (coronary or stroke death or non-fatal MI or stroke) in next 10 years.  7.7%   10-year cardiovascular risk if risk factors were optimal.   INPUTS: Age --> 72 years Diabetes --> 0 = No Sex --> 0 = Female Smoker --> 0 = No Total  cholesterol --> 154 mg/dL HDL cholesterol --> 79 mg/dL Systolic blood pressure --> 130 mm Hg Treatment for hypertension --> 1 = Yes Race --> 1 = White  Physical Exam:    VS:  BP 130/80 (BP Location: Left Arm, Patient Position: Sitting, Cuff Size: Normal)   Pulse (!) 57   Ht 4' 11 (1.499 m)   Wt 148 lb (67.1 kg)   BMI 29.89 kg/m     Wt Readings from Last 3 Encounters:  12/17/23 148 lb (67.1 kg)  10/30/23 139 lb (63 kg)  09/03/23 139 lb (63 kg)   GEN:  Well nourished, well developed in no acute distress HEENT: Normal NECK: No carotid bruits CARDIAC: S1-S2 normal, RRR, no murmurs, rubs, gallops RESPIRATORY:  Clear to auscultation without rales, wheezing or rhonchi  MUSCULOSKELETAL:  No edema; No deformity, DP/PT +1 bilaterally SKIN: Warm and dry NEUROLOGIC:  Alert and oriented x 3 PSYCHIATRIC:  Normal affect   ASSESSMENT:    1. SOBOE (shortness of breath on exertion)   2. Essential  hypertension   3. Bilateral carotid bruits   4. Hyperlipidemia, unspecified hyperlipidemia type    PLAN:    In order of problems listed above:  Exertional dyspnea, palpitations - EKG in office shows sinus bradycardia, 57 bpm, no significant changes from previous EKGs. - Lung sounds clear to auscultation, no lower extremity edema, appears euvolemic on exam - Latest echo and coronary CTA >3 years ago - Not currently on ASA - Intermediate ASCVD risk score - Will order repeat echo - Will order repeat coronary CTA to rule out DOE as anginal equivalent   Hypertension - BP's appear well controlled on current regiment, scanned home BP list into chart today - PCP managing hypertension - Continue losartan 50 mg p.o. daily - Continue metoprolol (Lopressor) 25 mg twice daily  Carotid bruits - No bruits appreciated on exam today - Lightheadedness brought on by positional changes, dehydration, and has improved since decreasing her losartan by half over the last two week. Hx of orthostatic hypotension in past. - No disease observed on prior bilateral duplex in 2022  Hyperlipidemia - PCP managing hyperlipidemia  - Lipid panel on 07/09/2023: cholesterol 154, HDL 79, LDL 59, triglycerides 89  - Continue Repatha injections every 14 days  - Continue Zetia 10 mg at bedtime   Follow up with Dr. Francyne in 3 months      Medication Adjustments/Labs and Tests Ordered: Current medicines are reviewed at length with the patient today.  Concerns regarding medicines are outlined above.  Orders Placed This Encounter  Procedures   CT CORONARY MORPH W/CTA COR W/SCORE W/CA W/CM &/OR WO/CM   EKG 12-Lead   ECHOCARDIOGRAM COMPLETE   No orders of the defined types were placed in this encounter.   Patient Instructions  Medication Instructions:  Your physician has recommended you make the following change in your medication:  HOLD LOSARTAN THE DAY OF THE CORONARY CT-A TAKE METOPROLOL 50 MG TWO HOURS  PRIOR TO THE TEST  *If you need a refill on your cardiac medications before your next appointment, please call your pharmacy*  Lab Work: NONE If you have labs (blood work) drawn today and your tests are completely normal, you will receive your results only by: MyChart Message (if you have MyChart) OR A paper copy in the mail If you have any lab test that is abnormal or we need to change your treatment, we will call you to review the results.  Testing/Procedures: YOUR PROVIDER RECOMMENDS THAT  YOU HAVE A CORONARY CT-A     Your physician has requested that you have an echocardiogram. Echocardiography is a painless test that uses sound waves to create images of your heart. It provides your doctor with information about the size and shape of your heart and how well your heart's chambers and valves are working. This procedure takes approximately one hour. There are no restrictions for this procedure. Please do NOT wear cologne, perfume, aftershave, or lotions (deodorant is allowed). Please arrive 15 minutes prior to your appointment time.  Please note: We ask at that you not bring children with you during ultrasound (echo/ vascular) testing. Due to room size and safety concerns, children are not allowed in the ultrasound rooms during exams. Our front office staff cannot provide observation of children in our lobby area while testing is being conducted. An adult accompanying a patient to their appointment will only be allowed in the ultrasound room at the discretion of the ultrasound technician under special circumstances. We apologize for any inconvenience.   Follow-Up: At [facility], you and your health needs are our priority.  As part of our continuing mission to provide you with exceptional heart care, our providers are all part of one team.  This team includes your primary Cardiologist (physician) and Advanced Practice Providers or APPs (Physician Assistants and Nurse Practitioners) who  all work together to provide you with the care you need, when you need it.  Your next appointment:   3 month(s)  Provider:   Jerel Balding, MD   We recommend signing up for the patient portal called MyChart.  Sign up information is provided on this After Visit Summary.  MyChart is used to connect with patients for Virtual Visits (Telemedicine).  Patients are able to view lab/test results, encounter notes, upcoming appointments, etc.  Non-urgent messages can be sent to your provider as well.   To learn more about what you can do with MyChart, go to [email or web address].   Other Instructions   Your cardiac CT will be scheduled at one of the below locations:   [facility] [address] [place], [place] [address] [phone]  OR   [facility] [address]  [place], [place] [address]  If scheduled at [facility], please arrive at the [facility] and Children's Entrance (Entrance C2) of [facility] 30 minutes prior to test start time. You can use the FREE valet parking offered at entrance C (encouraged to control the heart rate for the test)  Proceed to the [facility] Radiology Department (first floor) to check-in and test prep.  All radiology patients and guests should use entrance C2 at [facility], accessed from [facility], even though the hospital's physical address listed is [address]  If scheduled at the [facility] V[facility] Tower at [address], please enter the parking lot using the [address] entrance and use the FREE valet service at the patient drop-off area. Enter the building and check-in with registration on the main floor.  Please follow these instructions carefully (unless otherwise directed):  An IV will be required for this test and Nitroglycerin  will be given.  Hold all erectile dysfunction medications at least 3 days (72 hrs) prior to test. (Ie viagra, cialis,  sildenafil, tadalafil, etc)   On the Night Before the Test: Be sure to Drink plenty of water . Do not consume any caffeinated/decaffeinated beverages or chocolate 12 hours prior to your test. Do  not take any antihistamines 12 hours prior to your test.  On the Day of the Test: Drink plenty of water  until 1 hour prior to the test. Do not eat any food 1 hour prior to test. You may take your regular medications prior to the test.  Take metoprolol (Lopressor) 50 MG two hours prior to test. HOLD LOSARTAN THE MORNING OF THE TEST If you take Furosemide/Hydrochlorothiazide/Spironolactone/Chlorthalidone, please HOLD on the morning of the test. Patients who wear a continuous glucose monitor MUST remove the device prior to scanning. FEMALES- please wear underwire-free bra if available, avoid dresses & tight clothing      After the Test: Drink plenty of water . After receiving IV contrast, you may experience a mild flushed feeling. This is normal. On occasion, you may experience a mild rash up to 24 hours after the test. This is not dangerous. If this occurs, you can take Benadryl  25 mg, Zyrtec, Claritin, or Allegra and increase your fluid intake. (Patients taking Tikosyn should avoid Benadryl , and may take Zyrtec, Claritin, or Allegra) If you experience trouble breathing, this can be serious. If it is severe call 911 IMMEDIATELY. If it is mild, please call our office.  We will call to schedule your test 2-4 weeks out understanding that some insurance companies will need an authorization prior to the service being performed.   For more information and frequently asked questions, please visit our website : [email or web address]  For non-scheduling related questions, please contact the cardiac imaging nurse navigator should you have any questions/concerns: Cardiac Imaging Nurse Navigators Direct Office Dial: [phone]   For scheduling needs, including cancellations and rescheduling,  please call [place], [phone].        Signed, Miriam FORBES Shams, NP  12/17/2023 5:02 PM    [facility]

## 2023-12-17 ENCOUNTER — Ambulatory Visit: Attending: Student | Admitting: Emergency Medicine

## 2023-12-17 VITALS — BP 130/80 | HR 57 | Ht 59.0 in | Wt 148.0 lb

## 2023-12-17 DIAGNOSIS — I10 Essential (primary) hypertension: Secondary | ICD-10-CM | POA: Diagnosis not present

## 2023-12-17 DIAGNOSIS — R0989 Other specified symptoms and signs involving the circulatory and respiratory systems: Secondary | ICD-10-CM

## 2023-12-17 DIAGNOSIS — R0602 Shortness of breath: Secondary | ICD-10-CM | POA: Diagnosis not present

## 2023-12-17 DIAGNOSIS — E785 Hyperlipidemia, unspecified: Secondary | ICD-10-CM | POA: Diagnosis not present

## 2023-12-17 NOTE — Patient Instructions (Addendum)
 Medication Instructions:  Your physician has recommended you make the following change in your medication:  HOLD LOSARTAN THE DAY OF THE CORONARY CT-A TAKE METOPROLOL 50 MG TWO HOURS PRIOR TO THE TEST  *If you need a refill on your cardiac medications before your next appointment, please call your pharmacy*  Lab Work: NONE If you have labs (blood work) drawn today and your tests are completely normal, you will receive your results only by: MyChart Message (if you have MyChart) OR A paper copy in the mail If you have any lab test that is abnormal or we need to change your treatment, we will call you to review the results.  Testing/Procedures: YOUR PROVIDER RECOMMENDS THAT YOU HAVE A CORONARY CT-A     Your physician has requested that you have an echocardiogram. Echocardiography is a painless test that uses sound waves to create images of your heart. It provides your doctor with information about the size and shape of your heart and how well your heart's chambers and valves are working. This procedure takes approximately one hour. There are no restrictions for this procedure. Please do NOT wear cologne, perfume, aftershave, or lotions (deodorant is allowed). Please arrive 15 minutes prior to your appointment time.  Please note: We ask at that you not bring children with you during ultrasound (echo/ vascular) testing. Due to room size and safety concerns, children are not allowed in the ultrasound rooms during exams. Our front office staff cannot provide observation of children in our lobby area while testing is being conducted. An adult accompanying a patient to their appointment will only be allowed in the ultrasound room at the discretion of the ultrasound technician under special circumstances. We apologize for any inconvenience.   Follow-Up: At [facility], you and your health needs are our priority.  As part of our continuing mission to provide you with exceptional heart care, our  providers are all part of one team.  This team includes your primary Cardiologist (physician) and Advanced Practice Providers or APPs (Physician Assistants and Nurse Practitioners) who all work together to provide you with the care you need, when you need it.  Your next appointment:   3 month(s)  Provider:   Jerel Balding, MD   We recommend signing up for the patient portal called MyChart.  Sign up information is provided on this After Visit Summary.  MyChart is used to connect with patients for Virtual Visits (Telemedicine).  Patients are able to view lab/test results, encounter notes, upcoming appointments, etc.  Non-urgent messages can be sent to your provider as well.   To learn more about what you can do with MyChart, go to [email or web address].   Other Instructions   Your cardiac CT will be scheduled at one of the below locations:   [facility] [address] [place], [place] [address] [phone]  OR   [facility] [address]  [place], [place] [address]  If scheduled at [facility], please arrive at the [facility] and Children's Entrance (Entrance C2) of [facility] 30 minutes prior to test start time. You can use the FREE valet parking offered at entrance C (encouraged to control the heart rate for the test)  Proceed to the [facility] Radiology Department (first floor) to check-in and test prep.  All radiology patients and guests should use entrance C2 at [facility], accessed from [facility], even though the hospital's physical address listed is [address]  If scheduled at the Heart and Vascular Tower at [address], please enter the parking lot using the [address] entrance and use the FREE valet service at the patient drop-off area. Enter the building and check-in with registration on the main floor.  Please follow these instructions carefully (unless otherwise  directed):  An IV will be required for this test and Nitroglycerin  will be given.  Hold all erectile dysfunction medications at least 3 days (72 hrs) prior to test. (Ie viagra, cialis, sildenafil, tadalafil, etc)   On the Night Before the Test: Be sure to Drink plenty of water . Do not consume any caffeinated/decaffeinated beverages or chocolate 12 hours prior to your test. Do not take any antihistamines 12 hours prior to your test.  On the Day of the Test: Drink plenty of water  until 1 hour prior to the test. Do not eat any food 1 hour prior to test. You may take your regular medications prior to the test.  Take metoprolol (Lopressor) 50 MG two hours prior to test. HOLD LOSARTAN THE MORNING OF THE TEST If you take Furosemide/Hydrochlorothiazide/Spironolactone/Chlorthalidone, please HOLD on the morning of the test. Patients who wear a continuous glucose monitor MUST remove the device prior to scanning. FEMALES- please wear underwire-free bra if available, avoid dresses & tight clothing      After the Test: Drink plenty of water . After receiving IV contrast, you may experience a mild flushed feeling. This is normal. On occasion, you may experience a mild rash up to 24 hours after the test. This is not dangerous. If this occurs, you can take Benadryl  25 mg, Zyrtec, Claritin, or Allegra and increase your fluid intake. (Patients taking Tikosyn should avoid Benadryl , and may take Zyrtec, Claritin, or Allegra) If you experience trouble breathing, this can be serious. If it is severe call 911 IMMEDIATELY. If it is mild, please call our office.  We will call to schedule your test 2-4 weeks out understanding that some insurance companies will need an authorization prior to the service being performed.   For more information and frequently asked questions, please visit our website : [email or web address]  For non-scheduling related questions, please contact the cardiac imaging nurse  navigator should you have any questions/concerns: Cardiac Imaging Nurse Navigators Direct Office Dial: [phone]   For scheduling needs, including cancellations and rescheduling, please call [place], [phone].

## 2023-12-18 ENCOUNTER — Encounter: Payer: Self-pay | Admitting: Emergency Medicine

## 2023-12-20 ENCOUNTER — Other Ambulatory Visit (HOSPITAL_COMMUNITY): Payer: Self-pay

## 2023-12-21 ENCOUNTER — Ambulatory Visit: Payer: Self-pay | Admitting: Cardiovascular Disease

## 2023-12-23 ENCOUNTER — Encounter (INDEPENDENT_AMBULATORY_CARE_PROVIDER_SITE_OTHER): Payer: Self-pay

## 2024-01-03 DIAGNOSIS — R000 Tachycardia, unspecified: Secondary | ICD-10-CM | POA: Diagnosis not present

## 2024-01-03 DIAGNOSIS — I129 Hypertensive chronic kidney disease with stage 1 through stage 4 chronic kidney disease, or unspecified chronic kidney disease: Secondary | ICD-10-CM | POA: Diagnosis not present

## 2024-01-03 DIAGNOSIS — R0609 Other forms of dyspnea: Secondary | ICD-10-CM | POA: Diagnosis not present

## 2024-01-03 DIAGNOSIS — R42 Dizziness and giddiness: Secondary | ICD-10-CM | POA: Diagnosis not present

## 2024-01-03 DIAGNOSIS — N1831 Chronic kidney disease, stage 3a: Secondary | ICD-10-CM | POA: Diagnosis not present

## 2024-01-03 DIAGNOSIS — N3010 Interstitial cystitis (chronic) without hematuria: Secondary | ICD-10-CM | POA: Diagnosis not present

## 2024-01-23 ENCOUNTER — Encounter (HOSPITAL_COMMUNITY): Payer: Self-pay

## 2024-01-24 DIAGNOSIS — N3010 Interstitial cystitis (chronic) without hematuria: Secondary | ICD-10-CM | POA: Diagnosis not present

## 2024-01-27 ENCOUNTER — Ambulatory Visit (HOSPITAL_COMMUNITY)
Admission: RE | Admit: 2024-01-27 | Discharge: 2024-01-27 | Disposition: A | Discharge status: Home / Self Care | Source: Ambulatory Visit | Attending: Cardiology | Admitting: Cardiology

## 2024-01-27 ENCOUNTER — Ambulatory Visit (HOSPITAL_BASED_OUTPATIENT_CLINIC_OR_DEPARTMENT_OTHER)
Admission: RE | Admit: 2024-01-27 | Discharge: 2024-01-27 | Disposition: A | Discharge status: Home / Self Care | Source: Ambulatory Visit | Attending: Emergency Medicine | Admitting: Emergency Medicine

## 2024-01-27 DIAGNOSIS — R079 Chest pain, unspecified: Secondary | ICD-10-CM | POA: Diagnosis not present

## 2024-01-27 DIAGNOSIS — I2584 Coronary atherosclerosis due to calcified coronary lesion: Secondary | ICD-10-CM | POA: Insufficient documentation

## 2024-01-27 DIAGNOSIS — Z136 Encounter for screening for cardiovascular disorders: Secondary | ICD-10-CM | POA: Insufficient documentation

## 2024-01-27 DIAGNOSIS — I340 Nonrheumatic mitral (valve) insufficiency: Secondary | ICD-10-CM

## 2024-01-27 DIAGNOSIS — R0602 Shortness of breath: Secondary | ICD-10-CM | POA: Insufficient documentation

## 2024-01-27 MED ORDER — NITROGLYCERIN 0.4 MG SL SUBL
SUBLINGUAL_TABLET | SUBLINGUAL | Status: AC
  Filled 2024-01-27: qty 2

## 2024-01-27 MED ORDER — IOHEXOL 350 MG/ML SOLN
100.0000 mL | Freq: Once | INTRAVENOUS | Status: AC | PRN
  Administered 2024-01-27: 100 mL via INTRAVENOUS

## 2024-01-27 MED ORDER — NITROGLYCERIN 0.4 MG SL SUBL
0.8000 mg | SUBLINGUAL_TABLET | Freq: Once | SUBLINGUAL | Status: AC
  Administered 2024-01-27: 0.8 mg via SUBLINGUAL

## 2024-01-28 LAB — ECHOCARDIOGRAM COMPLETE
Area-P 1/2: 3.27 cm2
S' Lateral: 2.6 cm

## 2024-02-03 ENCOUNTER — Ambulatory Visit (INDEPENDENT_AMBULATORY_CARE_PROVIDER_SITE_OTHER): Admitting: Family Medicine

## 2024-02-03 ENCOUNTER — Encounter (INDEPENDENT_AMBULATORY_CARE_PROVIDER_SITE_OTHER): Payer: Self-pay | Admitting: Family Medicine

## 2024-02-03 ENCOUNTER — Encounter (INDEPENDENT_AMBULATORY_CARE_PROVIDER_SITE_OTHER): Payer: Self-pay

## 2024-02-03 VITALS — BP 118/67 | HR 67 | Temp 97.5°F | Ht 59.0 in | Wt 142.0 lb

## 2024-02-03 DIAGNOSIS — Z6828 Body mass index (BMI) 28.0-28.9, adult: Secondary | ICD-10-CM | POA: Diagnosis not present

## 2024-02-03 DIAGNOSIS — Z719 Counseling, unspecified: Secondary | ICD-10-CM

## 2024-02-03 DIAGNOSIS — E559 Vitamin D deficiency, unspecified: Secondary | ICD-10-CM | POA: Diagnosis not present

## 2024-02-03 DIAGNOSIS — R7303 Prediabetes: Secondary | ICD-10-CM

## 2024-02-03 DIAGNOSIS — E669 Obesity, unspecified: Secondary | ICD-10-CM | POA: Diagnosis not present

## 2024-02-03 DIAGNOSIS — N1831 Chronic kidney disease, stage 3a: Secondary | ICD-10-CM

## 2024-02-03 DIAGNOSIS — N2889 Other specified disorders of kidney and ureter: Secondary | ICD-10-CM

## 2024-02-03 MED ORDER — ONETOUCH ULTRA VI STRP: ORAL_STRIP | 3 refills | Status: AC

## 2024-02-03 MED ORDER — METFORMIN HCL ER 500 MG PO TB24: 500.0000 mg | ORAL_TABLET | Freq: Every day | ORAL | 0 refills | Status: DC

## 2024-02-03 NOTE — Progress Notes (Signed)
 Office: [phone]  /  Fax: [phone]  WEIGHT SUMMARY AND BIOMETRICS  Anthropometric Measurements Height: 4' 11 (1.499 m) Weight: 142 lb (64.4 kg) BMI (Calculated): 28.67 Weight at Last Visit: 139 lb Weight Lost Since Last Visit: 0 Weight Gained Since Last Visit: 3 lb Starting Weight: 147 lb Total Weight Loss (lbs): 5 lb (2.268 kg) Peak Weight: 151 lb   Body Composition  Body Fat %: 39.8 % Fat Mass (lbs): 56.8 lbs Muscle Mass (lbs): 81.6 lbs Total Body Water  (lbs): 57.2 lbs Visceral Fat Rating : 11   Other Clinical Data Fasting: yes Labs: no Today's Visit #: 26 Starting Date: 08/03/21    Chief Complaint: OBESITY   History of Present Illness Brenda [name] is a 73 year old female with obesity and prediabetes who presents for obesity treatment and progress assessment.  She has been following a journaling plan with a goal of 1000 calories and 75 or more grams of protein, but adherence is about 50% of the time. She is working on increasing her intake of fruits and vegetables and ensuring adequate protein consumption. However, she skips meals and does not hydrate appropriately. She exercises three days a week for ten minutes each session. Despite these efforts, she has gained three pounds in the last three months.  In managing prediabetes, she is decreasing simple carbohydrates, increasing exercise, and focusing on protein intake and weight loss. She previously used metformin  but discontinued it due to gastrointestinal upset, including stomach pain and diarrhea. She monitors her blood sugar at least twice a day, with readings ranging from 99 to 168 mg/dL. She is concerned about her blood sugar levels, especially since her mother was diabetic. She is trying to reduce stress to help manage her blood sugar and has sold property to lessen her responsibilities.  She is exploring dietary changes, such as reducing diet soda intake and incorporating more protein-rich foods.  She has started using chicken tenders and zero-sugar barbecue sauce, and is trying to eat earlier in the evening. She is also experimenting with various water  additives like cinnamon, lemon, ginger, and stevia to improve hydration. She has been using protein powder with no sugar and is mindful of fruit intake, focusing on low-sugar options like berries. She is also considering plain [place] yogurt with berries as a healthier option.  Her family history includes a sister with chronic kidney disease and a mother who was diabetic. She is aware of the potential impact of diabetes on her kidney health, as she has stage 3A kidney disease. She is trying to stay hydrated and is cautious about using electrolyte drinks due to their potential impact on kidney function.      PHYSICAL EXAM:  Blood pressure 118/67, pulse 67, temperature (!) 97.5 F (36.4 C), height 4' 11 (1.499 m), weight 142 lb (64.4 kg), SpO2 100%. Body mass index is 28.68 kg/m.  DIAGNOSTIC DATA REVIEWED:  BMET    Component Value Date/Time   NA 142 07/09/2023 0948   K 4.2 07/09/2023 0948   CL 103 07/09/2023 0948   CO2 20 07/09/2023 0948   GLUCOSE 82 07/09/2023 0948   GLUCOSE 93 02/28/2021 1050   BUN 23 07/09/2023 0948   CREATININE 1.19 (H) 07/09/2023 0948   CALCIUM 9.9 07/09/2023 0948   GFRNONAA 44.4 09/11/2022 1437   GFRAA >60 12/04/2016 1351   Lab Results  Component Value Date   HGBA1C 6.3 (H) 07/09/2023   HGBA1C 6.1 (H) 08/03/2021   Lab Results  Component Value Date  INSULIN  17.1 07/09/2023   INSULIN  4.9 08/03/2021   Lab Results  Component Value Date   TSH 2.170 12/19/2021   CBC    Component Value Date/Time   WBC 8.5 07/16/2023 1257   RBC 4.45 07/16/2023 1257   HGB 13.6 07/16/2023 1257   HGB 13.6 05/01/2022 1158   HCT 42.2 07/16/2023 1257   HCT 41.0 05/01/2022 1158   PLT 305 07/16/2023 1257   PLT 259 05/01/2022 1158   MCV 94.8 07/16/2023 1257   MCV 93 05/01/2022 1158   MCH 30.6 07/16/2023 1257   MCHC  32.2 07/16/2023 1257   RDW 14.1 07/16/2023 1257   RDW 14.2 05/01/2022 1158   Iron Studies No results found for: IRON, TIBC, FERRITIN, IRONPCTSAT Lipid Panel     Component Value Date/Time   CHOL 154 07/09/2023 0948   TRIG 89 07/09/2023 0948   HDL 79 07/09/2023 0948   LDLCALC 59 07/09/2023 0948   Hepatic Function Panel     Component Value Date/Time   PROT 6.6 07/09/2023 0948   ALBUMIN 4.4 07/09/2023 0948   AST 21 07/09/2023 0948   ALT 25 07/09/2023 0948   ALKPHOS 93 07/09/2023 0948   BILITOT 0.5 07/09/2023 0948      Component Value Date/Time   TSH 2.170 12/19/2021 0920   Nutritional Lab Results  Component Value Date   VD25OH 74.6 07/09/2023   VD25OH 72.2 12/11/2022   VD25OH 33.5 09/11/2022     Assessment and Plan Assessment & Plan Obesity She has been following a journaling plan with 1000 calories and 75 grams of protein about 50% of the time. She is working on increasing fruit and vegetable intake and protein consumption. She exercises three days a week for ten minutes. She has gained three pounds in the last three months. She prefers to follow her own portion control and smart choices plan rather than a structured plan or journaling. She is getting advice from TV and social media. - Continue current portion control and smart choices plan - Follow up in three months to assess weight management progress  Prediabetes Prediabetes is being managed primarily through dietary changes and exercise. She has stopped metformin  due to gastrointestinal upset. Blood sugar levels have been fluctuating, with a recent high of 168 mg/dL. She is considering extended-release metformin  as an option. She is actively trying to reduce stress and improve her diet by increasing protein intake and reducing simple carbohydrates. She is aware that most people who do not tolerate immediate-release metformin  do better on extended-release, although weight loss may not be as significant. -  Consider starting extended-release metformin  if tolerated - Provide more glucose test strips - Encourage dietary changes focusing on protein and reducing simple carbohydrates  Chronic kidney disease stage 3a She is aware of the potential impact of diabetes on kidney function and is trying to manage her prediabetes to prevent progression. She is not currently under the care of a nephrologist. She is hydrating more and considering dietary changes to support kidney health. - Encourage hydration with water  and healthy alternatives - Discuss potential nephrologist referral if kidney function worsens  Vit D deficiency Due for labs - Check labs and follow       Brenda [name] was informed of the importance of frequent follow up visits to maximize her success with intensive lifestyle modifications for her obesity and obesity related health conditions as recommended by USPSTF and CMS guidelines   Louann Penton, MD

## 2024-02-04 LAB — LIPID PANEL WITH LDL/HDL RATIO
Cholesterol, Total: 144 mg/dL (ref 100–199)
HDL: 61 mg/dL (ref >39)
LDL Chol Calc (NIH): 64 mg/dL (ref 0–99)
LDL/HDL Ratio: 1 ratio (ref 0.0–3.2)
Triglycerides: 105 mg/dL (ref 0–149)
VLDL Cholesterol Cal: 19 mg/dL (ref 5–40)

## 2024-02-04 LAB — CMP14+EGFR
ALT: 25 IU/L (ref 0–32)
AST: 22 IU/L (ref 0–40)
Albumin: 4.4 g/dL (ref 3.8–4.8)
Alkaline Phosphatase: 121 IU/L (ref 49–135)
BUN/Creatinine Ratio: 21 (ref 12–28)
BUN: 23 mg/dL (ref 8–27)
Bilirubin Total: 0.4 mg/dL (ref 0.0–1.2)
CO2: 20 mmol/L (ref 20–29)
Calcium: 10.3 mg/dL (ref 8.7–10.3)
Chloride: 102 mmol/L (ref 96–106)
Creatinine, Ser: 1.09 mg/dL — ABNORMAL HIGH (ref 0.57–1.00)
Globulin, Total: 2.3 g/dL (ref 1.5–4.5)
Glucose: 90 mg/dL (ref 70–99)
Potassium: 4.6 mmol/L (ref 3.5–5.2)
Sodium: 141 mmol/L (ref 134–144)
Total Protein: 6.7 g/dL (ref 6.0–8.5)
eGFR: 54 mL/min/1.73 — ABNORMAL LOW (ref >59)

## 2024-02-04 LAB — HEMOGLOBIN A1C
Est. average glucose Bld gHb Est-mCnc: 126 mg/dL
Hgb A1c MFr Bld: 6 % — ABNORMAL HIGH (ref 4.8–5.6)

## 2024-02-04 LAB — INSULIN, RANDOM: INSULIN: 13.8 u[IU]/mL (ref 2.6–24.9)

## 2024-02-04 LAB — VITAMIN D 25 HYDROXY (VIT D DEFICIENCY, FRACTURES): Vit D, 25-Hydroxy: 61.3 ng/mL (ref 30.0–100.0)

## 2024-02-14 DIAGNOSIS — R399 Unspecified symptoms and signs involving the genitourinary system: Secondary | ICD-10-CM | POA: Diagnosis not present

## 2024-02-14 DIAGNOSIS — N3010 Interstitial cystitis (chronic) without hematuria: Secondary | ICD-10-CM | POA: Diagnosis not present

## 2024-03-02 ENCOUNTER — Encounter: Payer: Self-pay | Admitting: Radiology

## 2024-03-02 DIAGNOSIS — Z1152 Encounter for screening for COVID-19: Secondary | ICD-10-CM | POA: Diagnosis not present

## 2024-03-02 DIAGNOSIS — N1831 Chronic kidney disease, stage 3a: Secondary | ICD-10-CM | POA: Diagnosis not present

## 2024-03-02 DIAGNOSIS — J0100 Acute maxillary sinusitis, unspecified: Secondary | ICD-10-CM | POA: Diagnosis not present

## 2024-03-02 DIAGNOSIS — J309 Allergic rhinitis, unspecified: Secondary | ICD-10-CM | POA: Diagnosis not present

## 2024-03-03 ENCOUNTER — Ambulatory Visit: Admitting: Cardiovascular Disease

## 2024-03-06 DIAGNOSIS — R399 Unspecified symptoms and signs involving the genitourinary system: Secondary | ICD-10-CM | POA: Diagnosis not present

## 2024-03-06 DIAGNOSIS — N3010 Interstitial cystitis (chronic) without hematuria: Secondary | ICD-10-CM | POA: Diagnosis not present

## 2024-03-13 ENCOUNTER — Encounter: Payer: Self-pay | Admitting: Cardiovascular Disease

## 2024-03-13 ENCOUNTER — Ambulatory Visit: Attending: Cardiovascular Disease | Admitting: Cardiovascular Disease

## 2024-03-13 VITALS — BP 136/74 | HR 60 | Ht 59.0 in | Wt 147.8 lb

## 2024-03-13 DIAGNOSIS — R7303 Prediabetes: Secondary | ICD-10-CM

## 2024-03-13 DIAGNOSIS — E782 Mixed hyperlipidemia: Secondary | ICD-10-CM | POA: Diagnosis not present

## 2024-03-13 DIAGNOSIS — T466X5A Adverse effect of antihyperlipidemic and antiarteriosclerotic drugs, initial encounter: Secondary | ICD-10-CM

## 2024-03-13 DIAGNOSIS — I10 Essential (primary) hypertension: Secondary | ICD-10-CM | POA: Diagnosis not present

## 2024-03-13 DIAGNOSIS — G720 Drug-induced myopathy: Secondary | ICD-10-CM

## 2024-03-13 DIAGNOSIS — I700 Atherosclerosis of aorta: Secondary | ICD-10-CM | POA: Diagnosis not present

## 2024-03-13 DIAGNOSIS — Z8673 Personal history of transient ischemic attack (TIA), and cerebral infarction without residual deficits: Secondary | ICD-10-CM

## 2024-03-13 DIAGNOSIS — N2889 Other specified disorders of kidney and ureter: Secondary | ICD-10-CM | POA: Diagnosis not present

## 2024-03-13 NOTE — Patient Instructions (Signed)

## 2024-03-13 NOTE — Progress Notes (Signed)
 Cardiology Office Note:    Date:  03/13/2024   ID:  Brenda [name], DOB 1950/09/06, MRN [id number]  PCP:  Janey Santos, MD   [facility] Providers Cardiologist:  Jerel Balding, MD     Referring MD: Janey Santos, MD   Chief Complaint  Patient presents with   Follow-up   History of Present Illness:    Brenda [name] is a 73 y.o. 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     She is a partially retired [employer] (was working part-time at [employer], they would like her to come back but she is not sure she wants to continue working; they have implemented a new computer system that she is not eager to learn).    She was having symptoms of orthostatic hypotension walking up and down the halls at work and also becoming short of breath.  She underwent repeat evaluation with an echocardiogram and a coronary CT angiogram.  There is no evidence of coronary stenoses and her calcium score was 0, although there is some aortic atherosclerotic calcification.  The echocardiogram was likewise very reassuring with just some age-related aortic valve sclerosis.  She was referred for evaluation of intermittent problems with shortness of breath and chest discomfort.  She underwent an echocardiogram and coronary CT angiography with very reassuring results.  The echocardiogram shows normal left ventricular systolic function and no significant valvular abnormalities.  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  Some aortic atherosclerosis was seen.  Normal caliber aorta.    After reducing her dose of  losartan she is no longer troubled by orthostatic dizziness and feels that her breathing has also improved.  Her blood pressure remains well-controlled.  She does not have edema, orthopnea, PND, lower extremity claudication or new focal neurological complaints.  She does not tolerate statins due to liver function test abnormalities, but is taking ezetimibe and Repatha.    Most recent metabolic values are all in desirable range with HDL 61, LDL 64 and hemoglobin A1c 3.9%.  Even creatinine has improved down to 1.09, with a GFR around 55.  MRIs have shown silent strokes in 1996 in 2009.  Her only warning that something was wrong was memory difficulty.  Her mother has a history of mitral valve prolapse and had a heart attack at a relatively advanced age.  Her 53 year old sister has atrial fibrillation and and a pacemaker as well as a history of stroke.   Past Medical History:  Diagnosis Date   Bilateral carotid bruits    followed by cardiologist   Bladder pain    Cervical disc disease    Chronic cough    followed by dr wert and ENT dr garnette silvan   Chronic kidney disease (CKD), stage III (moderate) (HCC)    Chronic seasonal allergic rhinitis    Dyspnea    with exertion    Fibromyalgia    Frequency of urination    GAD (generalized anxiety disorder)    GERD (gastroesophageal reflux disease)    Heart murmur    cardiologist--- dr Jennafer Gladue   History of pancreatitis [phone]   History of panic attacks  History of stroke 1996   mild without residuals;  per MRI imaging 2010 left thalamic infarct without residual   Hyperlipidemia, mixed    Interstitial cystitis    urologist-- dr watt   Irritable bowel syndrome with both constipation and diarrhea    followed by dr avram   Labile hypertension    followed by pcp;   hx normal nuclear stress test in 2009;   coronary CT 11-23-2020  normal coronaies, calcium score zero   Lumbar spondylosis    MDD (major depressive disorder)     Migraines    Moderate persistent asthma    followed by pcp and dr wert    Multiple pulmonary nodules    pulmonologist-- dr wert   Osteoarthritis    Palpitations    Panic disorder with agoraphobia    PONV (postoperative nausea and vomiting)    Pre-diabetes    Spasm of sphincter of Oddi 1987   02-13-2019  per pt no issues since she watches what medication's she takes   Stroke [facility])    Urethral stenosis    s/p multiple dilatations   Urgency of urination    Vitamin D  deficiency    Wears glasses     Past Surgical History:  Procedure Laterality Date   ABDOMINAL HYSTERECTOMY  1988   COMPLETE   APPENDECTOMY  07/1984   bilateral reduction mastopexy  1988   bilateral turbinate resection  1990   nasal spreading graft   BREAST CYST EXCISION Bilateral 03/18/2019   Procedure: excision necrotic breast tissue left ;  Surgeon: Lowery Estefana RAMAN, DO;  Location: [facility];  Service: Plastics;  Laterality: Bilateral;   BREAST CYST EXCISION Bilateral 06/30/2020   Procedure: Excision of right breast fat necrosis and left inferior breast;  Surgeon: Lowery Estefana RAMAN, DO;  Location: [facility];  Service: Plastics;  Laterality: Bilateral;   BREAST REDUCTION SURGERY  10/17/2018   Procedure: bilateral breast reduction;  Surgeon: Lowery Estefana RAMAN, DO;  Location: [facility];  Service: Plastics;;  please adjust time to reflect 4 hours   CATARACT EXTRACTION W/ INTRAOCULAR LENS  IMPLANT, BILATERAL  08 and 09/ 2017   CHOLECYSTECTOMY  07/1984   COLONOSCOPY  02/2004, 11/2010, LAST 03-2016 ENDO DONE ALSO   2005: Normal - Dr. Nolon 2012: Normal   CYSTO WITH HYDRODISTENSION N/A 12/17/2013   Procedure: CYSTO HYDRODISTENSION WITH INSTALLATION OF CHLOROPACTIN AND TETRACAINE ;  Surgeon: Norleen JINNY Watt, MD;  Location: [facility];  Service: Urology;  Laterality: N/A;   CYSTO WITH HYDRODISTENSION N/A 06/02/2015   Procedure: CYSTOSCOPY/HYDRODISTENSION  WITH INSTILLATION OF CLORPACTIN ;  Surgeon: Norleen Watt, MD;  Location: [facility];  Service: Urology;  Laterality: N/A;   CYSTO WITH HYDRODISTENSION N/A 11/08/2016   Procedure: CYSTOSCOPY/HYDRODISTENSION INSTILLATION OF CLORPACTIN;  Surgeon: Watt Norleen, MD;  Location: [facility];  Service: Urology;  Laterality: N/A;   CYSTO WITH HYDRODISTENSION N/A 03/06/2018   Procedure: CYSTOSCOPY/HYDRODISTENSION INSTILL CHLORPACTION;  Surgeon: Watt Norleen, MD;  Location: [facility];  Service: Urology;  Laterality: N/A;   CYSTO WITH HYDRODISTENSION N/A 04/02/2019   Procedure: CYSTOSCOPY/HYDRODISTENSION INSTILL CHLORPACTIN;  Surgeon: Watt Norleen, MD;  Location: [facility];  Service: Urology;  Laterality: N/A;   CYSTO WITH HYDRODISTENSION N/A 02/17/2020   Procedure: CYSTOSCOPY/HYDRODISTENTION WITH INSTILLATION OF CHLORPACTIN;  Surgeon: Watt Norleen, MD;  Location: [facility];  Service: Urology;  Laterality: N/A;  ONLY NEEDS 30 MIN   CYSTO WITH HYDRODISTENSION  N/A 02/28/2021   Procedure: CYSTOSCOPY/HYDRODISTENSION INSTILL CLORPACTION;  Surgeon: Watt Rush, MD;  Location: [facility];  Service: Urology;  Laterality: N/A;   CYSTO WITH HYDRODISTENSION N/A 05/04/2022   Procedure: CYSTOSCOPY/HYDRODISTENSION INSTILL CLORPACTIN AND TETRACAINE ;  Surgeon: Watt Rush, MD;  Location: [facility];  Service: Urology;  Laterality: N/A;   CYSTOSCOPY WITH HYDRODISTENSION AND BIOPSY N/A 07/19/2023   Procedure: CYSTOSCOPY, WITH BLADDER HYDRODISTENSION;  Surgeon: Watt Rush, MD;  Location: [facility];  Service: Urology;  Laterality: N/A;   CYSTOSCOPY WITH URETHRAL DILATATION  07/12/2011   Procedure: CYSTOSCOPY WITH URETHRAL DILATATION;  Surgeon: Rush JINNY Watt, MD;  Location: [facility];  Service: Urology;  Laterality: N/A;  HOD AND INSTILLATION OF CHLOROPACTIN C-ARM    CYSTOSCOPY WITH URETHRAL DILATATION N/A  06/26/2012   Procedure: CYSTOSCOPY WITH URETHRAL DILATATION HYDRODISTENSION AND INSTILLATION OF CLORPACTION ;  Surgeon: Rush JINNY Watt, MD;  Location: [facility];  Service: Urology;  Laterality: N/A;  CYSTOSCOPY WITH URETHRAL DILATATION HYDRODISTENSION AND INSTILLATION OF CLORPACTION    CYSTOSTOMY W/ BLADDER BIOPSY  1983   DG BARIUM SWALLOW ([facility] HX)  03/2016   DILATION AND CURETTAGE OF UTERUS  1987   ERCP  1988   for pancreatitis with stents   S/P SEVERE PANCREATITIS   HEMORRHOID SURGERY     LASIK Bilateral 07/2003   LIPOSUCTION Bilateral 10/17/2018   Procedure: LIPOSUCTION;  Surgeon: Lowery Estefana RAMAN, DO;  Location: [facility];  Service: Plastics;  Laterality: Bilateral;   LIPOSUCTION Right 03/18/2019   Procedure: excision right necrotic breast tissue with liposuction;  Surgeon: Lowery Estefana RAMAN, DO;  Location: [facility];  Service: Plastics;  Laterality: Right;   LIPOSUCTION Bilateral 06/30/2020   Procedure: Bilateral liposuction of lateral breast;  Surgeon: Lowery Estefana RAMAN, DO;  Location: [facility];  Service: Plastics;  Laterality: Bilateral;   MULTIPLE CYSTO/ HOD/ URETHRAL DILATION/ INSTILLATION CLORPACTIN  .LAST ONE 03-31-2010   NASAL SINUS SURGERY  1977   sinus cyst   REDUCTION MAMMAPLASTY Bilateral 1988   REDUCTION MAMMAPLASTY Bilateral 09/2018   REDUCTION MAMMAPLASTY Bilateral 03/2019   revision rhinoplasty  1978   RHINOPLASTY  1977   RIGHT SALPINGOOPHECTOMY  1998   TONSILLECTOMY AND ADENOIDECTOMY  1963   VIDEO BRONCHOSCOPY Bilateral 08/23/2016   Procedure: VIDEO BRONCHOSCOPY WITHOUT FLUORO;  Surgeon: Tonnie FORBES Flicker, MD;  Location: [facility];  Service: Cardiopulmonary;  Laterality: Bilateral;    Current Medications: Current Meds  Medication Sig   Acetaminophen  500 MG capsule Take 1 capsule by mouth every 6 (six) hours as needed for fever.   albuterol  (VENTOLIN  HFA) 108 (90 Base) MCG/ACT inhaler  Inhale 2 puffs into the lungs every 6 (six) hours as needed for wheezing (Asthma).   Blood Glucose Monitoring Suppl DEVI 1 each by Does not apply route once as needed for up to 1 dose. May substitute to any manufacturer covered by patient's insurance. (Patient taking differently: 1 each by Does not apply route 2 (two) times daily. May substitute to any manufacturer covered by patient's insurance.)   Cholecalciferol (VITAMIN D3) 50 MCG (2000 UT) CAPS Take 1 capsule by mouth daily at 6 (six) AM.   conjugated estrogens (PREMARIN) vaginal cream Place 1 applicator vaginally 2 (two) times a week.   ezetimibe (ZETIA) 10 MG tablet Take 10 mg by mouth at bedtime.   famotidine (PEPCID) 20 MG tablet Take 20 mg by mouth at bedtime as needed for heartburn or indigestion.  FLUoxetine  (PROZAC ) 10 MG capsule Take 1 capsule (10 mg total) by mouth daily.   fluticasone  (FLONASE) 50 MCG/ACT nasal spray Place 2 sprays into both nostrils daily as needed for allergies.   folic acid (FOLVITE) 1 MG tablet Take 1 mg by mouth daily.   glucose blood (ONETOUCH ULTRA) test strip Testing two times per day.   Lancets (ONETOUCH DELICA PLUS LANCET33G) MISC check blood sugar in THE morning, AT NOON, AND AT bedtime.   LORazepam  (ATIVAN ) 1 MG tablet TAKE 1 TABLET EVERY 8 HOURS AS NEEDED FOR ANXIETY.   losartan (COZAAR) 100 MG tablet Take 50 mg by mouth in the morning.   metFORMIN  (GLUCOPHAGE -XR) 500 MG 24 hr tablet Take 1 tablet (500 mg total) by mouth daily with breakfast.   metoprolol (LOPRESSOR) 50 MG tablet Take 25 mg by mouth 2 (two) times daily.   OVER THE COUNTER MEDICATION    pantoprazole (PROTONIX) 40 MG tablet Take 40 mg by mouth daily.   REPATHA SURECLICK 140 MG/ML SOAJ Inject 140 mg into the skin every 14 (fourteen) days.     Allergies:   Codeine, Latex, Stadol [butorphanol tartrate], Adhesive [tape], Bupivacaine , Butorphanol tartrate, Clarithromycin, Cymbalta [duloxetine hcl], Elavil [amitriptyline hcl], Gabapentin ,  Hydrocodone, Hydromorphone hcl, Levbid [hyoscyamine sulfate], Lisinopril, Penicillin g sodium, Pentazocine lactate, Percodan [oxycodone -aspirin], Propofol , Trazodone and nefazodone, Vistaril [hydroxyzine hcl], Wound dressing adhesive, and Morphine  and codeine   Family History: The patient's family history includes Allergies in her mother; Anxiety disorder in her father; Asthma in her mother; Brain cancer in her father; Colon cancer in her cousin and maternal grandmother; Colon polyps in her father; Diabetes in her mother and sister; Heart disease in her father, mother, and sister; High Cholesterol in her father; High blood pressure in her mother; Irritable bowel syndrome in her mother and sister; Melanoma in her sister; Obesity in her mother; Osteoarthritis in her father, mother, and sister; Stroke in her mother and sister; Ulcerative colitis in her maternal uncle. There is no history of Stomach cancer, Rheumatologic disease, Esophageal cancer, or Pancreatic cancer.  ROS:   Please see the history of present illness.     All other systems reviewed and are negative.  EKGs/Labs/Other Studies Reviewed:    The following studies were reviewed today: Echocardiogram 01/27/2024 1. Left ventricular ejection fraction, by estimation, is 55 to 60%. Left  ventricular ejection fraction by 3D volume is 62 %. The left ventricle has  normal function. The left ventricle has no regional wall motion  abnormalities. Left ventricular diastolic   parameters are indeterminate. The average left ventricular global  longitudinal strain is -19.9 %. The global longitudinal strain is normal.   2. Right ventricular systolic function is normal. The right ventricular  size is normal. There is normal pulmonary artery systolic pressure.   3. The mitral valve is normal in structure. Mild mitral valve  regurgitation. No evidence of mitral stenosis.   4. The aortic valve is tricuspid. There is mild calcification of the  aortic  valve. Aortic valve regurgitation is not visualized. No aortic  stenosis is present.   5. The inferior vena cava is normal in size with greater than 50%  respiratory variability, suggesting right atrial pressure of 3 mmHg.    Coronary CT angiography 01/27/2024  1.  Calcium score 0 2. 1-24% soft plaque in mid LAD with likely small area of bridging CAD RADS 1 non obstructive CAD 3.  Normal ascending thoracic aorta 3.2 cm  EKG:  EKG is not ordered today.  The ekg from 12/17/2023 is personally reviewed and shows sinus rhythm, QS pattern in leads III and aVF  EKG Interpretation Date/Time:    Ventricular Rate:    PR Interval:    QRS Duration:    QT Interval:    QTC Calculation:   R Axis:      Text Interpretation:           Recent Labs: 07/16/2023: Hemoglobin 13.6; Platelets 305 02/03/2024: ALT 25; BUN 23; Creatinine, Ser 1.09; Potassium 4.6; Sodium 141  08/09/2020 Glucose 101, creatinine 1.2 (GFR 60), potassium 5.3, normal liver function tests, TSH 2.63 Hemoglobin A1c 5.8% Hemoglobin 13.9, platelets 240 9K Recent Lipid Panel    Component Value Date/Time   CHOL 144 02/03/2024 1028   TRIG 105 02/03/2024 1028   HDL 61 02/03/2024 1028   LDLCALC 64 02/03/2024 1028   08/09/2020 Cholesterol 137, triglycerides 177, HDL 49, LDL 53, APO B 65  89/93/7974 Cholesterol 144, HDL 61, LDL 64, triglycerides 105 Hemoglobin A1c 6.0% Creatinine 1.09, potassium 4.6, ALT 25  Risk Assessment/Calculations:      Physical Exam:    VS:  BP 136/74 (BP Location: Left Arm, Patient Position: Sitting, Cuff Size: Normal)   Pulse 60   Ht 4' 11 (1.499 m)   Wt 147 lb 12.8 oz (67 kg)   SpO2 99%   BMI 29.85 kg/m     Wt Readings from Last 3 Encounters:  03/13/24 147 lb 12.8 oz (67 kg)  02/03/24 142 lb (64.4 kg)  12/17/23 148 lb (67.1 kg)     General: Alert, oriented x3, no distress, appears well Head: no evidence of trauma, PERRL, EOMI, no exophtalmos or lid lag, no myxedema, no xanthelasma;  normal ears, nose and oropharynx Neck: normal jugular venous pulsations and no hepatojugular reflux; brisk carotid pulses without delay and bilateral carotid bruits Chest: clear to auscultation, no signs of consolidation by percussion or palpation, normal fremitus, symmetrical and full respiratory excursions Cardiovascular: normal position and quality of the apical impulse, regular rhythm, normal first and second heart sounds, 1-2/6 aortic ejection murmur, no diastolic murmurs, rubs or gallops Abdomen: no tenderness or distention, no masses by palpation, no abnormal pulsatility or arterial bruits, normal bowel sounds, no hepatosplenomegaly Extremities: no clubbing, cyanosis or edema; 2+ radial, ulnar and brachial pulses bilaterally; 2+ right femoral, posterior tibial and dorsalis pedis pulses; 2+ left femoral, posterior tibial and dorsalis pedis pulses; no subclavian or femoral bruits Neurological: grossly nonfocal Psych: Normal mood and affect   ASSESSMENT:    1. Essential hypertension   2. Chronic renal impairment, stage 3a   3. Prediabetes   4. Mixed hyperlipidemia   5. Aortic atherosclerosis   6. History of stroke   7. Statin myopathy      PLAN:    In order of problems listed above:  Exertional dyspnea: Improved with reduction in the dose of losartan which also improved her orthostatic hypotension symptoms.  Reassuringly normal findings on echo and coronary CT angiography. HTN: Remains well-controlled on the lower dose of losartan. Murmur: Aortic valve sclerosis Carotid bruits: No evidence of carotid stenoses on duplex ultrasonography 2022 CKD3a: Markedly improved creatinine down to 1.09 Prediabetes: Most recent hemoglobin A1c 6.0%. Aortic atherosclerosis: Incidentally noted on CT, with normal caliber aorta.  History of stroke, including coronary atherosclerosis. History of ischemic strokes: remote in 1996 and 2009; no sequelae. HLP: Has statin myopathy.  Excellent lipid profile  on Repatha and ezetimibe.        Medication Adjustments/Labs and Tests Ordered:  Current medicines are reviewed at length with the patient today.  Concerns regarding medicines are outlined above.  No orders of the defined types were placed in this encounter.   No orders of the defined types were placed in this encounter.    Patient Instructions  Medication Instructions:  No changes *If you need a refill on your cardiac medications before your next appointment, please call your pharmacy*  Lab Work: None ordered If you have labs (blood work) drawn today and your tests are completely normal, you will receive your results only by: MyChart Message (if you have MyChart) OR A paper copy in the mail If you have any lab test that is abnormal or we need to change your treatment, we will call you to review the results.  Testing/Procedures: None ordered  Follow-Up: At [facility], you and your health needs are our priority.  As part of our continuing mission to provide you with exceptional heart care, our providers are all part of one team.  This team includes your primary Cardiologist (physician) and Advanced Practice Providers or APPs (Physician Assistants and Nurse Practitioners) who all work together to provide you with the care you need, when you need it.  Your next appointment:   1 year(s)  Provider:   Jerel Balding, MD    We recommend signing up for the patient portal called MyChart.  Sign up information is provided on this After Visit Summary.  MyChart is used to connect with patients for Virtual Visits (Telemedicine).  Patients are able to view lab/test results, encounter notes, upcoming appointments, etc.  Non-urgent messages can be sent to your provider as well.   To learn more about what you can do with MyChart, go to [email or web address].      Signed, Jerel Balding, MD  03/13/2024 1:59 PM    [facility]

## 2024-03-30 DIAGNOSIS — N3010 Interstitial cystitis (chronic) without hematuria: Secondary | ICD-10-CM | POA: Diagnosis not present

## 2024-04-03 ENCOUNTER — Other Ambulatory Visit: Payer: Self-pay | Admitting: Psychiatry

## 2024-04-03 DIAGNOSIS — F4001 Agoraphobia with panic disorder: Secondary | ICD-10-CM

## 2024-04-03 DIAGNOSIS — F411 Generalized anxiety disorder: Secondary | ICD-10-CM

## 2024-04-03 DIAGNOSIS — E119 Type 2 diabetes mellitus without complications: Secondary | ICD-10-CM | POA: Diagnosis not present

## 2024-04-03 DIAGNOSIS — H04123 Dry eye syndrome of bilateral lacrimal glands: Secondary | ICD-10-CM | POA: Diagnosis not present

## 2024-04-03 DIAGNOSIS — Z961 Presence of intraocular lens: Secondary | ICD-10-CM | POA: Diagnosis not present

## 2024-04-03 DIAGNOSIS — H5213 Myopia, bilateral: Secondary | ICD-10-CM | POA: Diagnosis not present

## 2024-04-15 ENCOUNTER — Other Ambulatory Visit (INDEPENDENT_AMBULATORY_CARE_PROVIDER_SITE_OTHER): Payer: Self-pay | Admitting: Family Medicine

## 2024-04-15 DIAGNOSIS — R7303 Prediabetes: Secondary | ICD-10-CM

## 2024-04-16 NOTE — Progress Notes (Signed)
 Brenda [name]                                          MRN: [id number]   04/16/2024   The [facility]I Quality Team Specialist reviewed this patient medical record for the purposes of chart review for care gap closure. The following were reviewed: chart review for care gap closure-kidney health evaluation for diabetes:eGFR  and uACR.    VBCI Quality Team

## 2024-05-02 ENCOUNTER — Other Ambulatory Visit (INDEPENDENT_AMBULATORY_CARE_PROVIDER_SITE_OTHER): Payer: Self-pay | Admitting: Family Medicine

## 2024-05-02 DIAGNOSIS — R7303 Prediabetes: Secondary | ICD-10-CM

## 2024-05-05 ENCOUNTER — Encounter (INDEPENDENT_AMBULATORY_CARE_PROVIDER_SITE_OTHER): Payer: Self-pay | Admitting: Family Medicine

## 2024-05-05 ENCOUNTER — Other Ambulatory Visit (INDEPENDENT_AMBULATORY_CARE_PROVIDER_SITE_OTHER): Payer: Self-pay | Admitting: Family Medicine

## 2024-05-05 ENCOUNTER — Ambulatory Visit (INDEPENDENT_AMBULATORY_CARE_PROVIDER_SITE_OTHER): Payer: Self-pay | Admitting: Family Medicine

## 2024-05-05 VITALS — BP 126/79 | HR 62 | Temp 97.3°F | Ht 59.0 in | Wt 142.0 lb

## 2024-05-05 DIAGNOSIS — R7303 Prediabetes: Secondary | ICD-10-CM

## 2024-05-05 DIAGNOSIS — E669 Obesity, unspecified: Secondary | ICD-10-CM

## 2024-05-05 DIAGNOSIS — E559 Vitamin D deficiency, unspecified: Secondary | ICD-10-CM | POA: Diagnosis not present

## 2024-05-05 DIAGNOSIS — E782 Mixed hyperlipidemia: Secondary | ICD-10-CM | POA: Diagnosis not present

## 2024-05-05 DIAGNOSIS — Z6828 Body mass index (BMI) 28.0-28.9, adult: Secondary | ICD-10-CM | POA: Diagnosis not present

## 2024-05-05 MED ORDER — METFORMIN HCL ER 500 MG PO TB24: 500.0000 mg | ORAL_TABLET | Freq: Every day | ORAL | 0 refills | Status: AC

## 2024-05-05 NOTE — Progress Notes (Signed)
 "  Office: [phone]  /  Fax: [phone]  WEIGHT SUMMARY AND BIOMETRICS  Anthropometric Measurements Height: 4' 11 (1.499 m) Weight: 142 lb (64.4 kg) BMI (Calculated): 28.67 Weight at Last Visit: 142 lb Weight Lost Since Last Visit: 0 Weight Gained Since Last Visit: 0 Starting Weight: 147 lb Total Weight Loss (lbs): 5 lb (2.268 kg) Peak Weight: 151 lb   Body Composition  Body Fat %: 41.7 % Fat Mass (lbs): 59.4 lbs Muscle Mass (lbs): 79 lbs Total Body Water  (lbs): 59 lbs Visceral Fat Rating : 12   Other Clinical Data Fasting: yes Labs: no Today's Visit #: 26 Starting Date: 08/03/21    Chief Complaint: OBESITY    History of Present Illness Brenda [name] is a 74 year old female with obesity and prediabetes who presents for obesity treatment and progress assessment.  She has been following a journaling plan with a target of 1000 calories per day and 75 or more grams of protein, achieving this about 40% of the time. She exercises three to four days a week for about ten minutes on her total gym. She is working on increasing her intake of fruits and vegetables but struggles with consuming adequate protein. She sometimes skips meals but maintains seven to nine hours of sleep per night. She has maintained her weight over the last two months, including through the holiday season.  She manages her prediabetes with diet, exercise, weight loss, and metformin  XR 500 mg daily. She requests a refill of her metformin . Her stomach discomfort and bowel movements have improved with the slow-release metformin , although she still experiences some mild symptoms. She takes the medication with food and monitors her blood sugar levels, which are usually between 100 and 116 in the morning and sometimes reach 120 at night after eating.  She has not been working since October due to a new computer system at her workplace and is considering whether to return to work. She is concerned about  losing her skills and the challenges of adapting to the new system. 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  She is experimenting with various dietary supplements and strategies, including a lectin shield supplement for gut health and an unsweetened gelatin mixture to help with satiety. She also takes magnesium glycinate, which she believes helps with muscle relaxation and sleep.      PHYSICAL EXAM:  Blood pressure 126/79, pulse 62, temperature (!) 97.3 F (36.3 C), height 4' 11 (1.499 m), weight 142 lb (64.4 kg), SpO2 99%. Body mass index is 28.68 kg/m.  DIAGNOSTIC DATA REVIEWED:  BMET    Component Value Date/Time   NA 141 02/03/2024 1028   K 4.6 02/03/2024 1028   CL 102 02/03/2024 1028   CO2 20 02/03/2024 1028   GLUCOSE 90 02/03/2024 1028   GLUCOSE 93 02/28/2021 1050   BUN 23 02/03/2024 1028   CREATININE 1.09 (H) 02/03/2024 1028   CALCIUM 10.3 02/03/2024 1028   GFRNONAA 44.4 09/11/2022 1437   GFRAA >60 12/04/2016 1351   Lab Results  Component Value Date   HGBA1C 6.0 (H) 02/03/2024   HGBA1C 6.1 (H) 08/03/2021   Lab Results  Component Value Date   INSULIN  13.8 02/03/2024   INSULIN  4.9 08/03/2021   Lab Results  Component Value Date   TSH 2.170 12/19/2021   CBC    Component Value Date/Time   WBC 8.5 07/16/2023 1257   RBC 4.45 07/16/2023 1257   HGB 13.6 07/16/2023 1257  HGB 13.6 05/01/2022 1158   HCT 42.2 07/16/2023 1257   HCT 41.0 05/01/2022 1158   PLT 305 07/16/2023 1257   PLT 259 05/01/2022 1158   MCV 94.8 07/16/2023 1257   MCV 93 05/01/2022 1158   MCH 30.6 07/16/2023 1257   MCHC 32.2 07/16/2023 1257   RDW 14.1 07/16/2023 1257   RDW 14.2 05/01/2022 1158   Iron Studies No results found for: IRON, TIBC, FERRITIN, IRONPCTSAT Lipid Panel     Component Value Date/Time   CHOL 144 02/03/2024 1028   TRIG 105 02/03/2024 1028   HDL 61 02/03/2024 1028   LDLCALC 64 02/03/2024 1028   Hepatic  Function Panel     Component Value Date/Time   PROT 6.7 02/03/2024 1028   ALBUMIN 4.4 02/03/2024 1028   AST 22 02/03/2024 1028   ALT 25 02/03/2024 1028   ALKPHOS 121 02/03/2024 1028   BILITOT 0.4 02/03/2024 1028      Component Value Date/Time   TSH 2.170 12/19/2021 0920   Nutritional Lab Results  Component Value Date   VD25OH 61.3 02/03/2024   VD25OH 74.6 07/09/2023   VD25OH 72.2 12/11/2022     Assessment and Plan Assessment & Plan Obesity Management includes a journaling plan with a 1000 calorie per day intake and 75 or more grams of protein. She adheres to this plan about 40% of the time. Exercises 3-4 days a week for about 10 minutes on a total gym. Struggles with adequate protein intake but is working on increasing fruits and vegetables. Maintained weight over the last two months despite holidays. Discussed meal prep strategies to meet calorie and protein goals, including recipes with adequate protein and lower calorie options. - Continue current journaling plan with 1000 calories per day and 75 or more grams of protein. - Encouraged exercise 3-4 days a week. - Provided handout with meal prep recipes and grocery list.   Prediabetes Managed with diet, exercise, weight loss, and metformin  XR 500 mg daily. Reports improvement with slow-release metformin , experiencing less gastrointestinal discomfort. Blood sugars typically range from 100-116 mg/dL in the morning and up to 120 mg/dL at night, depending on meals. Discussed the use of lectin shield supplement for gut health and inflammation reduction, with no interactions with current medications. - Refilled metformin  XR 500 mg for 90 days. - Ordered lab work to check blood sugars, kidney function, liver function, cholesterol, vitamin D , and B12 levels. - Continue monitoring blood sugars at home.  Mixed hyperlipidemia Managed with Zetia 10 mg at bedtime. Working on diet, exercise, and weight loss. Lab work will be checked to  assess lipid levels. - Continue Zetia 10 mg at bedtime. - Ordered lab work to check cholesterol levels.  Vitamin D  deficiency Managed with over-the-counter vitamin D  2000 IU daily. Lab work will be checked to assess vitamin D  levels. - Continue vitamin D  2000 IU daily. - Ordered lab work to check vitamin D  levels.      Patients who are on anti-obesity medications are counseled on the importance of maintaining healthy lifestyle habits, including balanced nutrition, regular physical activity, and behavioral modifications,  Medication is an adjunct to, not a replacement for, lifestyle changes and that the long-term success and weight maintenance depend on continued adherence to these strategies.   Tekela was informed of the importance of frequent follow up visits to maximize her success with intensive lifestyle modifications for her obesity and obesity related health conditions as recommended by USPSTF and CMS guidelines  Louann Penton, MD   "

## 2024-05-06 LAB — LIPID PANEL WITH LDL/HDL RATIO
Cholesterol, Total: 145 mg/dL (ref 100–199)
HDL: 56 mg/dL
LDL Chol Calc (NIH): 69 mg/dL (ref 0–99)
LDL/HDL Ratio: 1.2 ratio (ref 0.0–3.2)
Triglycerides: 108 mg/dL (ref 0–149)
VLDL Cholesterol Cal: 20 mg/dL (ref 5–40)

## 2024-05-06 LAB — HEMOGLOBIN A1C
Est. average glucose Bld gHb Est-mCnc: 126 mg/dL
Hgb A1c MFr Bld: 6 % — ABNORMAL HIGH (ref 4.8–5.6)

## 2024-05-06 LAB — CMP14+EGFR
ALT: 17 IU/L (ref 0–32)
AST: 18 IU/L (ref 0–40)
Albumin: 4.3 g/dL (ref 3.8–4.8)
Alkaline Phosphatase: 113 IU/L (ref 49–135)
BUN/Creatinine Ratio: 17 (ref 12–28)
BUN: 18 mg/dL (ref 8–27)
Bilirubin Total: 0.4 mg/dL (ref 0.0–1.2)
CO2: 23 mmol/L (ref 20–29)
Calcium: 10.1 mg/dL (ref 8.7–10.3)
Chloride: 101 mmol/L (ref 96–106)
Creatinine, Ser: 1.09 mg/dL — ABNORMAL HIGH (ref 0.57–1.00)
Globulin, Total: 2 g/dL (ref 1.5–4.5)
Glucose: 88 mg/dL (ref 70–99)
Potassium: 4.3 mmol/L (ref 3.5–5.2)
Sodium: 141 mmol/L (ref 134–144)
Total Protein: 6.3 g/dL (ref 6.0–8.5)
eGFR: 54 mL/min/1.73 — ABNORMAL LOW

## 2024-05-06 LAB — INSULIN, RANDOM: INSULIN: 9.7 u[IU]/mL (ref 2.6–24.9)

## 2024-05-06 LAB — VITAMIN D 25 HYDROXY (VIT D DEFICIENCY, FRACTURES): Vit D, 25-Hydroxy: 82.6 ng/mL (ref 30.0–100.0)

## 2024-05-06 LAB — VITAMIN B12: Vitamin B-12: 648 pg/mL (ref 232–1245)

## 2024-05-21 DIAGNOSIS — Z719 Counseling, unspecified: Secondary | ICD-10-CM

## 2024-06-01 ENCOUNTER — Other Ambulatory Visit (HOSPITAL_COMMUNITY): Payer: Self-pay

## 2024-06-01 MED ORDER — MEPERIDINE HCL 50 MG PO TABS
75.0000 mg | ORAL_TABLET | ORAL | 0 refills | Status: AC
  Filled 2024-06-01: qty 2, 1d supply, fill #0

## 2024-06-02 ENCOUNTER — Other Ambulatory Visit (HOSPITAL_COMMUNITY): Payer: Self-pay

## 2024-07-07 ENCOUNTER — Ambulatory Visit (INDEPENDENT_AMBULATORY_CARE_PROVIDER_SITE_OTHER): Admitting: Family Medicine

## 2024-09-22 ENCOUNTER — Ambulatory Visit: Admitting: Psychiatry
